# Patient Record
Sex: Female | Born: 1950 | Race: White | Hispanic: No | Smoking: Never smoker
Health system: Southern US, Community
[De-identification: ages and names within clinical notes are randomized; demographics above are authoritative.]

## PROBLEM LIST (undated history)

## (undated) DIAGNOSIS — R112 Nausea with vomiting, unspecified: Secondary | ICD-10-CM

## (undated) DIAGNOSIS — R002 Palpitations: Secondary | ICD-10-CM

## (undated) DIAGNOSIS — M1990 Unspecified osteoarthritis, unspecified site: Secondary | ICD-10-CM

## (undated) DIAGNOSIS — I10 Essential (primary) hypertension: Secondary | ICD-10-CM

## (undated) DIAGNOSIS — R7303 Prediabetes: Secondary | ICD-10-CM

## (undated) DIAGNOSIS — K219 Gastro-esophageal reflux disease without esophagitis: Secondary | ICD-10-CM

## (undated) DIAGNOSIS — H1010 Acute atopic conjunctivitis, unspecified eye: Secondary | ICD-10-CM

## (undated) DIAGNOSIS — R011 Cardiac murmur, unspecified: Secondary | ICD-10-CM

## (undated) DIAGNOSIS — E041 Nontoxic single thyroid nodule: Secondary | ICD-10-CM

## (undated) DIAGNOSIS — R0789 Other chest pain: Secondary | ICD-10-CM

## (undated) DIAGNOSIS — J189 Pneumonia, unspecified organism: Secondary | ICD-10-CM

## (undated) DIAGNOSIS — I2510 Atherosclerotic heart disease of native coronary artery without angina pectoris: Secondary | ICD-10-CM

## (undated) DIAGNOSIS — Z1589 Genetic susceptibility to other disease: Secondary | ICD-10-CM

## (undated) DIAGNOSIS — G4730 Sleep apnea, unspecified: Secondary | ICD-10-CM

## (undated) DIAGNOSIS — F418 Other specified anxiety disorders: Secondary | ICD-10-CM

## (undated) DIAGNOSIS — G709 Myoneural disorder, unspecified: Secondary | ICD-10-CM

## (undated) DIAGNOSIS — I2089 Other forms of angina pectoris: Secondary | ICD-10-CM

## (undated) DIAGNOSIS — Z9889 Other specified postprocedural states: Secondary | ICD-10-CM

## (undated) DIAGNOSIS — D509 Iron deficiency anemia, unspecified: Secondary | ICD-10-CM

## (undated) DIAGNOSIS — IMO0002 Reserved for concepts with insufficient information to code with codable children: Secondary | ICD-10-CM

## (undated) DIAGNOSIS — E785 Hyperlipidemia, unspecified: Secondary | ICD-10-CM

## (undated) DIAGNOSIS — R5383 Other fatigue: Secondary | ICD-10-CM

## (undated) DIAGNOSIS — T8859XA Other complications of anesthesia, initial encounter: Secondary | ICD-10-CM

## (undated) DIAGNOSIS — I208 Other forms of angina pectoris: Secondary | ICD-10-CM

## (undated) DIAGNOSIS — H269 Unspecified cataract: Secondary | ICD-10-CM

## (undated) DIAGNOSIS — R9430 Abnormal result of cardiovascular function study, unspecified: Secondary | ICD-10-CM

## (undated) DIAGNOSIS — K5730 Diverticulosis of large intestine without perforation or abscess without bleeding: Secondary | ICD-10-CM

## (undated) DIAGNOSIS — T7840XA Allergy, unspecified, initial encounter: Secondary | ICD-10-CM

## (undated) HISTORY — DX: Palpitations: R00.2

## (undated) HISTORY — DX: Prediabetes: R73.03

## (undated) HISTORY — DX: Hyperlipidemia, unspecified: E78.5

## (undated) HISTORY — DX: Sleep apnea, unspecified: G47.30

## (undated) HISTORY — DX: Other chest pain: R07.89

## (undated) HISTORY — DX: Acute atopic conjunctivitis, unspecified eye: H10.10

## (undated) HISTORY — DX: Other forms of angina pectoris: I20.8

## (undated) HISTORY — DX: Other forms of angina pectoris: I20.89

## (undated) HISTORY — DX: Abnormal result of cardiovascular function study, unspecified: R94.30

## (undated) HISTORY — DX: Iron deficiency anemia, unspecified: D50.9

## (undated) HISTORY — PX: TUBAL LIGATION: SHX77

## (undated) HISTORY — PX: APPENDECTOMY: SHX54

## (undated) HISTORY — DX: Allergy, unspecified, initial encounter: T78.40XA

## (undated) HISTORY — DX: Other specified anxiety disorders: F41.8

## (undated) HISTORY — DX: Gastro-esophageal reflux disease without esophagitis: K21.9

## (undated) HISTORY — DX: Reserved for concepts with insufficient information to code with codable children: IMO0002

## (undated) HISTORY — PX: BREAST BIOPSY: SHX20

## (undated) HISTORY — DX: Other fatigue: R53.83

## (undated) HISTORY — DX: Genetic susceptibility to other disease: Z15.89

## (undated) HISTORY — PX: THYROIDECTOMY, PARTIAL: SHX18

## (undated) HISTORY — DX: Myoneural disorder, unspecified: G70.9

## (undated) HISTORY — PX: VESICOVAGINAL FISTULA CLOSURE W/ TAH: SUR271

## (undated) HISTORY — PX: GANGLION CYST EXCISION: SHX1691

## (undated) HISTORY — DX: Essential (primary) hypertension: I10

## (undated) HISTORY — DX: Unspecified osteoarthritis, unspecified site: M19.90

## (undated) HISTORY — DX: Nontoxic single thyroid nodule: E04.1

## (undated) HISTORY — DX: Cardiac murmur, unspecified: R01.1

## (undated) HISTORY — DX: Diverticulosis of large intestine without perforation or abscess without bleeding: K57.30

## (undated) HISTORY — DX: Unspecified cataract: H26.9

---

## 1999-03-31 ENCOUNTER — Other Ambulatory Visit: Admission: RE | Admit: 1999-03-31 | Discharge: 1999-03-31 | Payer: Self-pay | Admitting: Family Medicine

## 1999-07-29 ENCOUNTER — Encounter: Payer: Self-pay | Admitting: Urology

## 1999-08-04 ENCOUNTER — Inpatient Hospital Stay (HOSPITAL_COMMUNITY): Admission: RE | Admit: 1999-08-04 | Discharge: 1999-08-05 | Payer: Self-pay | Admitting: Urology

## 2000-08-20 ENCOUNTER — Ambulatory Visit (HOSPITAL_COMMUNITY): Admission: RE | Admit: 2000-08-20 | Discharge: 2000-08-20 | Payer: Self-pay | Admitting: Family Medicine

## 2000-08-20 ENCOUNTER — Encounter: Payer: Self-pay | Admitting: Family Medicine

## 2000-12-06 ENCOUNTER — Encounter: Payer: Self-pay | Admitting: Family Medicine

## 2000-12-06 ENCOUNTER — Ambulatory Visit (HOSPITAL_COMMUNITY): Admission: RE | Admit: 2000-12-06 | Discharge: 2000-12-06 | Payer: Self-pay | Admitting: Family Medicine

## 2002-02-17 ENCOUNTER — Encounter: Payer: Self-pay | Admitting: Internal Medicine

## 2002-02-17 ENCOUNTER — Ambulatory Visit (HOSPITAL_COMMUNITY): Admission: RE | Admit: 2002-02-17 | Discharge: 2002-02-17 | Payer: Self-pay | Admitting: Internal Medicine

## 2002-02-17 ENCOUNTER — Encounter (INDEPENDENT_AMBULATORY_CARE_PROVIDER_SITE_OTHER): Payer: Self-pay | Admitting: Specialist

## 2002-02-24 ENCOUNTER — Other Ambulatory Visit: Admission: RE | Admit: 2002-02-24 | Discharge: 2002-02-24 | Payer: Self-pay | Admitting: Family Medicine

## 2003-01-30 ENCOUNTER — Encounter: Payer: Self-pay | Admitting: Family Medicine

## 2003-01-30 ENCOUNTER — Encounter: Admission: RE | Admit: 2003-01-30 | Discharge: 2003-01-30 | Payer: Self-pay | Admitting: Family Medicine

## 2003-04-04 ENCOUNTER — Other Ambulatory Visit: Admission: RE | Admit: 2003-04-04 | Discharge: 2003-04-04 | Payer: Self-pay | Admitting: Family Medicine

## 2003-06-22 ENCOUNTER — Ambulatory Visit (HOSPITAL_COMMUNITY): Admission: RE | Admit: 2003-06-22 | Discharge: 2003-06-22 | Payer: Self-pay | Admitting: Cardiology

## 2004-01-10 ENCOUNTER — Other Ambulatory Visit: Admission: RE | Admit: 2004-01-10 | Discharge: 2004-01-10 | Payer: Self-pay | Admitting: Diagnostic Radiology

## 2004-02-13 ENCOUNTER — Ambulatory Visit: Admission: RE | Admit: 2004-02-13 | Discharge: 2004-02-13 | Payer: Self-pay | Admitting: Family Medicine

## 2004-03-31 ENCOUNTER — Encounter (INDEPENDENT_AMBULATORY_CARE_PROVIDER_SITE_OTHER): Payer: Self-pay | Admitting: Specialist

## 2004-03-31 ENCOUNTER — Observation Stay (HOSPITAL_COMMUNITY): Admission: RE | Admit: 2004-03-31 | Discharge: 2004-04-01 | Payer: Self-pay | Admitting: Surgery

## 2005-02-16 ENCOUNTER — Other Ambulatory Visit: Admission: RE | Admit: 2005-02-16 | Discharge: 2005-02-16 | Payer: Self-pay | Admitting: Family Medicine

## 2005-03-02 ENCOUNTER — Encounter: Admission: RE | Admit: 2005-03-02 | Discharge: 2005-03-02 | Payer: Self-pay | Admitting: *Deleted

## 2007-07-28 ENCOUNTER — Ambulatory Visit: Payer: Self-pay | Admitting: Cardiology

## 2007-08-08 ENCOUNTER — Ambulatory Visit: Payer: Self-pay

## 2007-08-17 ENCOUNTER — Ambulatory Visit: Payer: Self-pay | Admitting: Cardiology

## 2007-09-12 ENCOUNTER — Ambulatory Visit: Payer: Self-pay | Admitting: Cardiology

## 2007-11-15 ENCOUNTER — Ambulatory Visit: Payer: Self-pay | Admitting: Cardiology

## 2007-12-06 ENCOUNTER — Ambulatory Visit: Payer: Self-pay | Admitting: Cardiology

## 2008-02-21 ENCOUNTER — Encounter: Payer: Self-pay | Admitting: Internal Medicine

## 2008-02-28 ENCOUNTER — Encounter: Payer: Self-pay | Admitting: Internal Medicine

## 2008-05-03 DIAGNOSIS — K5730 Diverticulosis of large intestine without perforation or abscess without bleeding: Secondary | ICD-10-CM | POA: Insufficient documentation

## 2008-05-03 DIAGNOSIS — D509 Iron deficiency anemia, unspecified: Secondary | ICD-10-CM | POA: Insufficient documentation

## 2008-05-03 DIAGNOSIS — I10 Essential (primary) hypertension: Secondary | ICD-10-CM | POA: Insufficient documentation

## 2008-05-03 DIAGNOSIS — K219 Gastro-esophageal reflux disease without esophagitis: Secondary | ICD-10-CM | POA: Insufficient documentation

## 2008-05-03 DIAGNOSIS — J45909 Unspecified asthma, uncomplicated: Secondary | ICD-10-CM | POA: Insufficient documentation

## 2008-05-03 DIAGNOSIS — F341 Dysthymic disorder: Secondary | ICD-10-CM | POA: Insufficient documentation

## 2008-05-03 DIAGNOSIS — M129 Arthropathy, unspecified: Secondary | ICD-10-CM | POA: Insufficient documentation

## 2008-05-03 DIAGNOSIS — M1990 Unspecified osteoarthritis, unspecified site: Secondary | ICD-10-CM | POA: Insufficient documentation

## 2008-05-03 DIAGNOSIS — G4733 Obstructive sleep apnea (adult) (pediatric): Secondary | ICD-10-CM | POA: Insufficient documentation

## 2008-05-07 ENCOUNTER — Ambulatory Visit: Payer: Self-pay | Admitting: Internal Medicine

## 2008-05-07 LAB — CONVERTED CEMR LAB: Tissue Transglutaminase Ab, IgA: 0.9 units (ref <7)

## 2008-05-08 LAB — CONVERTED CEMR LAB
Basophils Absolute: 0.1 10*3/uL (ref 0.0–0.1)
Basophils Relative: 0.9 % (ref 0.0–3.0)
Eosinophils Absolute: 0.1 10*3/uL (ref 0.0–0.7)
Eosinophils Relative: 1.2 % (ref 0.0–5.0)
HCT: 40.8 % (ref 36.0–46.0)
Hemoglobin: 14 g/dL (ref 12.0–15.0)
Iron: 107 ug/dL (ref 42–145)
Lymphocytes Relative: 30.4 % (ref 12.0–46.0)
MCHC: 34.3 g/dL (ref 30.0–36.0)
MCV: 89.7 fL (ref 78.0–100.0)
Monocytes Absolute: 0.4 10*3/uL (ref 0.1–1.0)
Monocytes Relative: 7.1 % (ref 3.0–12.0)
Neutro Abs: 3.6 10*3/uL (ref 1.4–7.7)
Neutrophils Relative %: 60.4 % (ref 43.0–77.0)
Platelets: 277 10*3/uL (ref 150–400)
RBC: 4.54 M/uL (ref 3.87–5.11)
RDW: 17.8 % — ABNORMAL HIGH (ref 11.5–14.6)
WBC: 6.1 10*3/uL (ref 4.5–10.5)

## 2008-05-22 ENCOUNTER — Ambulatory Visit: Payer: Self-pay | Admitting: Internal Medicine

## 2008-05-22 ENCOUNTER — Encounter: Payer: Self-pay | Admitting: Internal Medicine

## 2008-05-24 ENCOUNTER — Encounter: Payer: Self-pay | Admitting: Internal Medicine

## 2008-06-18 ENCOUNTER — Ambulatory Visit: Payer: Self-pay | Admitting: Cardiology

## 2008-07-06 ENCOUNTER — Ambulatory Visit: Payer: Self-pay | Admitting: Internal Medicine

## 2008-07-09 ENCOUNTER — Telehealth: Payer: Self-pay | Admitting: Internal Medicine

## 2008-07-09 LAB — CONVERTED CEMR LAB
HCT: 40.4 % (ref 36.0–46.0)
Hemoglobin: 14.2 g/dL (ref 12.0–15.0)

## 2008-07-27 ENCOUNTER — Ambulatory Visit: Payer: Self-pay | Admitting: Internal Medicine

## 2008-07-30 ENCOUNTER — Telehealth: Payer: Self-pay | Admitting: Internal Medicine

## 2008-09-05 ENCOUNTER — Telehealth: Payer: Self-pay | Admitting: Internal Medicine

## 2008-11-14 ENCOUNTER — Telehealth: Payer: Self-pay | Admitting: Internal Medicine

## 2008-11-16 ENCOUNTER — Ambulatory Visit: Payer: Self-pay | Admitting: Internal Medicine

## 2008-11-16 LAB — CONVERTED CEMR LAB
Bacteria, UA: NEGATIVE
Basophils Absolute: 0.1 10*3/uL (ref 0.0–0.1)
Basophils Relative: 1.1 % (ref 0.0–3.0)
Bilirubin, Direct: 0.1 mg/dL (ref 0.0–0.3)
CO2: 31 meq/L (ref 19–32)
Calcium: 9.4 mg/dL (ref 8.4–10.5)
Eosinophils Relative: 1.3 % (ref 0.0–5.0)
Folate: >20 ng/mL
GFR calc Af Amer: 94 mL/min
GFR calc non Af Amer: 78 mL/min
Hgb A1c MFr Bld: 5.8 % (ref 4.6–6.0)
Ketones, ur: NEGATIVE mg/dL
Leukocytes, UA: NEGATIVE
Lymphocytes Relative: 37.6 % (ref 12.0–46.0)
Mucus, UA: NEGATIVE
Neutrophils Relative %: 52.3 % (ref 43.0–77.0)
Prolactin: 8.5 ng/mL
RBC / HPF: NONE SEEN
RBC: 4.49 M/uL (ref 3.87–5.11)
Saturation Ratios: 17.6 % — ABNORMAL LOW (ref 20.0–50.0)
Sodium: 143 meq/L (ref 135–145)
Specific Gravity, Urine: <=1.005 (ref 1.000–1.035)
TSH: 1.96 microintl units/mL (ref 0.35–5.50)
Total Bilirubin: 0.7 mg/dL (ref 0.3–1.2)
Transferrin: 276.7 mg/dL (ref 212.0–360.0)
Urine Glucose: NEGATIVE mg/dL
Vitamin B-12: 512 pg/mL (ref 211–911)
WBC: 6.4 10*3/uL (ref 4.5–10.5)
pH: 6.5 (ref 5.0–8.0)

## 2008-11-18 ENCOUNTER — Encounter: Payer: Self-pay | Admitting: Internal Medicine

## 2008-11-30 ENCOUNTER — Ambulatory Visit: Payer: Self-pay | Admitting: Internal Medicine

## 2008-12-11 DIAGNOSIS — E785 Hyperlipidemia, unspecified: Secondary | ICD-10-CM | POA: Insufficient documentation

## 2008-12-12 ENCOUNTER — Ambulatory Visit: Payer: Self-pay | Admitting: Cardiology

## 2008-12-12 ENCOUNTER — Encounter: Payer: Self-pay | Admitting: Cardiology

## 2009-02-18 ENCOUNTER — Encounter: Payer: Self-pay | Admitting: Internal Medicine

## 2009-02-19 ENCOUNTER — Ambulatory Visit: Payer: Self-pay | Admitting: Internal Medicine

## 2009-02-19 ENCOUNTER — Encounter (INDEPENDENT_AMBULATORY_CARE_PROVIDER_SITE_OTHER): Payer: Self-pay | Admitting: *Deleted

## 2009-02-19 LAB — CONVERTED CEMR LAB
BUN: 22 mg/dL (ref 6–23)
Cholesterol: 161 mg/dL (ref 0–200)
Creatinine, Ser: 0.7 mg/dL (ref 0.4–1.2)
GFR calc non Af Amer: 90.6 mL/min (ref >60)
Glucose, Bld: 88 mg/dL (ref 70–99)
HDL: 44 mg/dL (ref >39.00)
LDL Cholesterol: 77 mg/dL (ref 0–99)
Total CHOL/HDL Ratio: 4
Triglycerides: 200 mg/dL — ABNORMAL HIGH (ref 0.0–149.0)
Vit D, 25-Hydroxy: 39 ng/mL (ref 30–89)

## 2009-02-20 ENCOUNTER — Encounter: Payer: Self-pay | Admitting: Internal Medicine

## 2009-03-05 ENCOUNTER — Telehealth: Payer: Self-pay | Admitting: Internal Medicine

## 2009-07-05 ENCOUNTER — Ambulatory Visit: Payer: Self-pay | Admitting: Internal Medicine

## 2009-07-05 DIAGNOSIS — R7303 Prediabetes: Secondary | ICD-10-CM | POA: Insufficient documentation

## 2009-07-05 LAB — CONVERTED CEMR LAB
BUN: 25 mg/dL — ABNORMAL HIGH (ref 6–23)
CO2: 32 meq/L (ref 19–32)
Calcium: 9 mg/dL (ref 8.4–10.5)
Creatinine, Ser: 0.7 mg/dL (ref 0.4–1.2)
Hgb A1c MFr Bld: 5.8 % (ref 4.6–6.5)

## 2009-08-28 ENCOUNTER — Encounter: Admission: RE | Admit: 2009-08-28 | Discharge: 2009-09-04 | Payer: Self-pay | Admitting: Internal Medicine

## 2009-08-28 ENCOUNTER — Encounter: Payer: Self-pay | Admitting: Internal Medicine

## 2009-09-12 ENCOUNTER — Encounter: Payer: Self-pay | Admitting: Internal Medicine

## 2009-10-14 ENCOUNTER — Encounter: Admission: RE | Admit: 2009-10-14 | Discharge: 2009-11-21 | Payer: Self-pay | Admitting: Internal Medicine

## 2009-11-01 ENCOUNTER — Ambulatory Visit: Payer: Self-pay | Admitting: Internal Medicine

## 2009-11-01 LAB — CONVERTED CEMR LAB
Albumin: 3.9 g/dL (ref 3.5–5.2)
Alkaline Phosphatase: 59 units/L (ref 39–117)
Calcium: 9.5 mg/dL (ref 8.4–10.5)
Creatinine, Ser: 0.7 mg/dL (ref 0.4–1.2)
GFR calc non Af Amer: 90.39 mL/min (ref >60)
Glucose, Bld: 85 mg/dL (ref 70–99)
Hgb A1c MFr Bld: 5.7 % (ref 4.6–6.5)
Sodium: 143 meq/L (ref 135–145)
Total Protein: 7.1 g/dL (ref 6.0–8.3)

## 2009-11-04 ENCOUNTER — Encounter: Payer: Self-pay | Admitting: Internal Medicine

## 2009-12-12 ENCOUNTER — Encounter: Payer: Self-pay | Admitting: Cardiology

## 2009-12-13 ENCOUNTER — Ambulatory Visit: Payer: Self-pay | Admitting: Cardiology

## 2010-01-24 ENCOUNTER — Telehealth: Payer: Self-pay | Admitting: Internal Medicine

## 2010-01-28 ENCOUNTER — Encounter: Payer: Self-pay | Admitting: Internal Medicine

## 2010-02-24 ENCOUNTER — Encounter: Admission: RE | Admit: 2010-02-24 | Discharge: 2010-05-25 | Payer: Self-pay | Admitting: Internal Medicine

## 2010-06-19 ENCOUNTER — Ambulatory Visit: Payer: Self-pay | Admitting: Internal Medicine

## 2010-06-19 LAB — CONVERTED CEMR LAB
Alkaline Phosphatase: 56 units/L (ref 39–117)
BUN: 23 mg/dL (ref 6–23)
Basophils Absolute: 0 10*3/uL (ref 0.0–0.1)
Basophils Relative: 0.7 % (ref 0.0–3.0)
Bilirubin, Direct: 0.1 mg/dL (ref 0.0–0.3)
CO2: 31 meq/L (ref 19–32)
Calcium: 9.6 mg/dL (ref 8.4–10.5)
Chloride: 101 meq/L (ref 96–112)
Cholesterol: 143 mg/dL (ref 0–200)
Creatinine, Ser: 0.7 mg/dL (ref 0.4–1.2)
Eosinophils Absolute: 0.1 10*3/uL (ref 0.0–0.7)
Glucose, Bld: 100 mg/dL — ABNORMAL HIGH (ref 70–99)
HDL: 43.1 mg/dL (ref >39.00)
Hgb A1c MFr Bld: 5.9 % (ref 4.6–6.5)
Iron: 93 ug/dL (ref 42–145)
Lymphocytes Relative: 34 % (ref 12.0–46.0)
MCHC: 34.6 g/dL (ref 30.0–36.0)
MCV: 95 fL (ref 78.0–100.0)
Monocytes Absolute: 0.3 10*3/uL (ref 0.1–1.0)
Neutrophils Relative %: 57.2 % (ref 43.0–77.0)
Platelets: 260 10*3/uL (ref 150.0–400.0)
RDW: 12.9 % (ref 11.5–14.6)
Total CHOL/HDL Ratio: 3
Total Protein: 7.1 g/dL (ref 6.0–8.3)
Transferrin: 297.2 mg/dL (ref 212.0–360.0)
Triglycerides: 130 mg/dL (ref 0.0–149.0)

## 2010-06-25 ENCOUNTER — Encounter
Admission: RE | Admit: 2010-06-25 | Discharge: 2010-06-25 | Payer: Self-pay | Source: Home / Self Care | Attending: Internal Medicine | Admitting: Internal Medicine

## 2010-07-01 ENCOUNTER — Telehealth: Payer: Self-pay | Admitting: Internal Medicine

## 2010-07-04 ENCOUNTER — Telehealth: Payer: Self-pay | Admitting: Internal Medicine

## 2010-07-14 ENCOUNTER — Telehealth: Payer: Self-pay | Admitting: Internal Medicine

## 2010-09-22 ENCOUNTER — Encounter
Admission: RE | Admit: 2010-09-22 | Discharge: 2010-10-07 | Payer: Self-pay | Source: Home / Self Care | Attending: Internal Medicine | Admitting: Internal Medicine

## 2010-09-22 ENCOUNTER — Encounter: Payer: Self-pay | Admitting: Internal Medicine

## 2010-09-23 ENCOUNTER — Telehealth: Payer: Self-pay | Admitting: Internal Medicine

## 2010-09-23 ENCOUNTER — Encounter: Payer: Self-pay | Admitting: Internal Medicine

## 2010-10-02 ENCOUNTER — Telehealth: Payer: Self-pay | Admitting: Internal Medicine

## 2010-10-07 NOTE — Letter (Signed)
Summary: Results Follow-up Letter  [facility]  [address] [place], [place] [address]   Phone: [phone]  Fax: [phone]    11/04/2009  [address] [place], [place]  [address]  Dear [facility],   The following are the results of your recent test(s):  Test     Result     Blood sugar     85 A1C= 5.7     unchanged Liver       normal   _________________________________________________________  Please call for an appointment as directed _________________________________________________________ _________________________________________________________ _________________________________________________________  Sincerely,  Sanda Linger MD [facility]

## 2010-10-07 NOTE — Assessment & Plan Note (Signed)
Summary: cpx-lb   Vital Signs:  Patient profile:   61 year old female Menstrual status:  postmenopausal Height:      65 inches Weight:      180.25 pounds BMI:     30.10 O2 Sat:      96 % on Room air Temp:     98.1 degrees F oral Pulse rate:   73 / minute Pulse rhythm:   regular Resp:     16 per minute BP sitting:   122 / 70  (left arm) Cuff size:   large  Vitals Entered By: Rock Nephew CMA (June 19, 2010 9:06 AM)  Nutrition Counseling: Patient's BMI is greater than 25 and therefore counseled on weight management options.  O2 Flow:  Room air CC: CPX w/labs discuss med refill and shigles vaccine, Preventive Care, Lipid Management Is Patient Diabetic? No Pain Assessment Patient in pain? no       Does patient need assistance? Functional Status Self care Ambulation Normal     Menstrual Status postmenopausal Last PAP Result Normal   Primary Care Provider:  Etta Grandchild MD  CC:  CPX w/labs discuss med refill and shigles vaccine, Preventive Care, and Lipid Management.  History of Present Illness: She returns for a complete physical, she needs a few prescription refills but she has no other complaints.  Asthma History    Initial Asthma Severity Rating:    Age range: 12+ years    Symptoms: 0-2 days/week    Nighttime Awakenings: 0-2/month    Interferes w/ normal activity: no limitations    SABA use (not for EIB): 0-2 days/week    Asthma Severity Assessment: Intermittent  Lipid Management History:      Positive NCEP/ATP III risk factors include female age 36 years old or older, hypertension, and ASHD (either angina/prior MI/prior CABG).  Negative NCEP/ATP III risk factors include non-diabetic, no family history for ischemic heart disease, non-tobacco-user status, no prior stroke/TIA, no peripheral vascular disease, and no history of aortic aneurysm.        The patient states that she knows about the "Therapeutic Lifestyle Change" diet.  Her compliance with the  TLC diet is good.  The patient expresses understanding of adjunctive measures for cholesterol lowering.  Adjunctive measures started by the patient include aerobic exercise, fiber, ASA, limit alcohol consumpton, and weight reduction.  She expresses no side effects from her lipid-lowering medication.  The patient denies any symptoms to suggest myopathy or liver disease.     Current Medications (verified): 1)  Pravachol 40 Mg Tabs (Pravastatin Sodium) .Marland Kitchen.. 1 Once Daily 2)  Flonase 50 Mcg/act Susp (Fluticasone Propionate) .... Uad 3)  Proventil Hfa 108 (90 Base) Mcg/act Aers (Albuterol Sulfate) .... As Needed 4)  Patanol 0.1 % Soln (Olopatadine Hcl) .Marland Kitchen.. 1-2 Gtt Each Eye  Two Times A Day As Needed 5)  Alprazolam 0.25 Mg Tabs (Alprazolam) .... Take 1 Tablet As Needed 6)  Celebrex 200 Mg Caps (Celecoxib) .... Take 1 Tablet By Mouth Once A Day 7)  Prevacid 24hr 15 Mg Cpdr (Lansoprazole) .... 2 Once Daily 8)  Prempro 0.45-1.5 Mg Tabs (Conj Estrog-Medroxyprogest Ace) .... One Tablet By Mouth Every Pm 9)  Vitamin C Cr 1000 Mg Cr-Tabs (Ascorbic Acid) .... One Tablet By Mouth Every Am 10)  L-Arginine 1000 Mg Tabs (Arginine) .... 3 Tabs Two Times A Day 11)  Cvs Vitamin D 2000 Unit Tabs (Cholecalciferol) .... One Every Day 12)  Multivitamins   Tabs (Multiple Vitamin) .... One Tablet Every  Day 13)  Fish Oil  Oil (Fish Oil) .... 3000 Mg Every Day 14)  Calcium-Magnesium-Zinc 15)  Diovan 160 Mg Tabs (Valsartan) .... One By Mouth Once Daily 16)  L Citrulline 500mg  .... Take 1 Tablet By Mouth Two Times A Day 17)  Coq10 100 Mg Caps (Coenzyme Q10) .Marland Kitchen.. 1 Once Daily 18)  Aspirin 81 Mg Tbec (Aspirin) .... Take One Tablet By Mouth Daily 19)  Desonide Lotion 20)  Luxiq 0.12 % Foam (Betamethasone Valerate) 21)  Paxil Cr 25 Mg Xr24h-Tab (Paroxetine Hcl) .Marland Kitchen.. 1 By Mouth Qd  Allergies (verified): 1)  ! Demerol 2)  ! Augmentin 3)  ! 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)   cath 2004..coronaries normal  /  nuclear  08/2007  normal EF  70% cath..2004  /   normal..nuclear..12/08  (no echo as of 4/7/20110 Thyroid nodule DIVERTICULOSIS, COLON (ICD-562.10) HYPERLIPIDEMIA (ICD-272.4) CONJUNCTIVITIS, ALLERGIC (ICD-372.14) FATIGUE, ACUTE (ICD-780.79) ANGINA PECTORIS NEC/NOS (ICD-413.9) THYROID CANCER, HX OF (ICD-V10.87) DIVERTICULOSIS, COLON (ICD-562.10) SLEEP APNEA (ICD-780.57) DEGENERATIVE JOINT DISEASE (ICD-715.90) DEPRESSION/ANXIETY (ICD-300.4) ANEMIA, IRON DEFICIENCY (ICD-280.9) GERD (ICD-530.81) PALPITATIONS, HX OF (ICD-V12.50) ARTHRITIS (ICD-716.90) ASTHMA (ICD-493.90) HYPERTENSION (ICD-401.9)  HLA-B27 positive  Past Surgical History: Last updated: 12/11/2008 * BLADDER TACK BREAST BIOPSY, HX OF (ICD-V15.9) TUBAL LIGATION, HX OF (ICD-V26.51) APPENDECTOMY, HX OF (ICD-V45.79) * CAESAREAN SECTION X2 HX OF GANGLION CYST, HX OF REMOVED (ICD-V13.3) UNSPEC PERSONAL HX PRESENTING HAZARDS HEALTH (ICD-V15.9) * HX OF LEFT THYROID LOBECTOMY  Family History: Last updated: 12/11/2008 No FH of Colon Cancer: Family History of Diabetes: Uncles Family History of Heart Disease: Maternal Aunt Testicular Cancer-brother Family History of Arthritis Family History Hypertension Father died at 75 yoa due to CNS aneurysm Mother died at age 17 Lung Cancer  Social History: Last updated: 12/11/2008 Occupation: Runner, broadcasting/film/video Alcohol Use - no Daily Caffeine Use Illicit Drug Use - no Regular exercise-yes Divorced   Risk Factors: Alcohol Use: 0 (07/05/2009) Exercise: yes (07/27/2008)  Risk Factors: Smoking Status: never (07/05/2009) Passive Smoke Exposure: no (07/05/2009)  Family History: Reviewed history from 12/11/2008 and no changes required. No FH of Colon Cancer: Family History of Diabetes: Uncles Family History of Heart Disease: Maternal Aunt Testicular Cancer-brother Family History of Arthritis Family History  Hypertension Father died at 31 yoa due to CNS aneurysm Mother died at age 78 Lung Cancer  Social History: Reviewed history from 12/11/2008 and no changes required. Occupation: Runner, broadcasting/film/video Alcohol Use - no Daily Caffeine Use Illicit Drug Use - no Regular exercise-yes Divorced   Review of Systems       The patient complains of weight gain.  The patient denies anorexia, fever, weight loss, hoarseness, chest pain, syncope, dyspnea on exertion, peripheral edema, prolonged cough, headaches, hemoptysis, abdominal pain, melena, hematochezia, severe indigestion/heartburn, hematuria, muscle weakness, suspicious skin lesions, depression, abnormal bleeding, enlarged lymph nodes, angioedema, and breast masses.   Psych:  Complains of anxiety; denies alternate hallucination ( auditory/visual), depression, easily angered, easily tearful, irritability, mental problems, panic attacks, sense of great danger, suicidal thoughts/plans, thoughts of violence, unusual visions or sounds, and thoughts /plans of harming others. Endo:  Denies cold intolerance, excessive hunger, excessive thirst, excessive urination, heat intolerance, polyuria, and weight change.  Physical Exam  General:  alert, well-developed, well-nourished, well-hydrated, cooperative to examination, good hygiene, and overweight-appearing.   Head:  normocephalic, atraumatic, and no abnormalities observed.   Mouth:  good dentition, no gingival abnormalities, no dental plaque, pharynx pink and moist, no erythema, no exudates, and no  posterior lymphoid hypertrophy.   Neck:  supple, full ROM, no masses, no thyromegaly, no thyroid nodules or tenderness, no JVD, no HJR, normal carotid upstroke, and no carotid bruits.   Breasts:  No mass, nodules, thickening, tenderness, bulging, retraction, inflamation, nipple discharge or skin changes noted.   Lungs:  Normal respiratory effort, chest expands symmetrically. Lungs are clear to auscultation, no crackles or  wheezes. Heart:  Normal rate and regular rhythm. S1 and S2 normal without gallop, murmur, click, rub or other extra sounds. Abdomen:  soft, non-tender, normal bowel sounds, no distention, no masses, no guarding, no rigidity, no rebound tenderness, no abdominal hernia, no inguinal hernia, no hepatomegaly, and no splenomegaly.   Rectal:  No external abnormalities noted. Normal sphincter tone. No rectal masses or tenderness. Msk:  normal ROM, no joint tenderness, no joint swelling, no joint warmth, and no redness over joints.   Pulses:  R and L carotid,radial,femoral,dorsalis pedis and posterior tibial pulses are full and equal bilaterally Extremities:  No clubbing, cyanosis, edema, or deformity noted with normal full range of motion of all joints.   Neurologic:  No cranial nerve deficits noted. Station and gait are normal. Plantar reflexes are down-going bilaterally. DTRs are symmetrical throughout. Sensory, motor and coordinative functions appear intact. Skin:  turgor normal, color normal, no rashes, no ecchymoses, no petechiae, no purpura, no ulcerations, and no edema.   Cervical Nodes:  no anterior cervical adenopathy and no posterior cervical adenopathy.   Axillary Nodes:  no R axillary adenopathy and no L axillary adenopathy.   Inguinal Nodes:  no R inguinal adenopathy and no L inguinal adenopathy.   Psych:  Cognition and judgment appear intact. Alert and cooperative with normal attention span and concentration. No apparent delusions, illusions, hallucinations   Impression & Recommendations:  Problem # 1:  HYPERGLYCEMIA (ICD-790.29) Assessment Unchanged  Orders: Venipuncture ([address]) TLB-Lipid Panel (80061-LIPID) TLB-BMP (Basic Metabolic Panel-BMET) (80048-METABOL) TLB-CBC Platelet - w/Differential (85025-CBCD) TLB-Hepatic/Liver Function Pnl (80076-HEPATIC) TLB-TSH (Thyroid Stimulating Hormone) (84443-TSH) TLB-A1C / Hgb A1C (Glycohemoglobin) (83036-A1C) TLB-IBC Pnl (Iron/FE;Transferrin)  (83550-IBC)  Problem # 2:  HYPERLIPIDEMIA (ICD-272.4) Assessment: Unchanged  The following medications were removed from the medication list:    Niaspan 500 Mg Cr-tabs (Niacin (antihyperlipidemic)) ..... One by mouth at bedtime Her updated medication list for this problem includes:    Pravachol 40 Mg Tabs (Pravastatin sodium) .Marland Kitchen... 1 once daily  Orders: Venipuncture ([address]) TLB-Lipid Panel (80061-LIPID) TLB-BMP (Basic Metabolic Panel-BMET) (80048-METABOL) TLB-CBC Platelet - w/Differential (85025-CBCD) TLB-Hepatic/Liver Function Pnl (80076-HEPATIC) TLB-TSH (Thyroid Stimulating Hormone) (84443-TSH) TLB-A1C / Hgb A1C (Glycohemoglobin) (83036-A1C) TLB-IBC Pnl (Iron/FE;Transferrin) (83550-IBC)  Problem # 3:  ROUTINE GENERAL MEDICAL EXAM@HEALTH  CARE FACL (ICD-V70.0) Assessment: Unchanged  Mammogram: Normal Bilateral (09/12/2009) Pap smear: Normal (06/20/2008) Colonoscopy: Location:  [facility].   (05/22/2008) Flu Vax: Fluvax 3+ (06/19/2010)   Chol: 161 (02/19/2009)   HDL: 44.00 (02/19/2009)   LDL: 77 (02/19/2009)   TG: 200.0 (02/19/2009) TSH: 1.96 (11/16/2008)   HgbA1C: 5.7 (11/01/2009)    Discussed using sunscreen, use of alcohol, drug use, self breast exam, routine dental care, routine eye care, schedule for GYN exam, routine physical exam, seat belts, multiple vitamins, osteoporosis prevention, adequate calcium intake in diet, recommendations for immunizations, mammograms and Pap smears.  Discussed exercise and checking cholesterol.    Problem # 4:  SLEEP APNEA (ICD-780.57) Assessment: Unchanged  Orders: Sleep Disorder Referral (Sleep Disorder) Home Health Referral (Home Health) Home Health Referral [facility])  Problem # 5:  DEPRESSION/ANXIETY (ICD-300.4) Assessment: Unchanged  Problem #  6:  ANEMIA, IRON DEFICIENCY (ICD-280.9) Assessment: Improved  Orders: Venipuncture ([address]) TLB-Lipid Panel (80061-LIPID) TLB-BMP (Basic Metabolic Panel-BMET)  (80048-METABOL) TLB-CBC Platelet - w/Differential (85025-CBCD) TLB-Hepatic/Liver Function Pnl (80076-HEPATIC) TLB-TSH (Thyroid Stimulating Hormone) (84443-TSH) TLB-A1C / Hgb A1C (Glycohemoglobin) (83036-A1C) TLB-IBC Pnl (Iron/FE;Transferrin) (83550-IBC)  Hgb: 14.7 (11/16/2008)   Hct: 42.4 (11/16/2008)   Platelets: 262 (11/16/2008) RBC: 4.49 (11/16/2008)   RDW: 12.1 (11/16/2008)   WBC: 6.4 (11/16/2008) MCV: 94.3 (11/16/2008)   MCHC: 34.8 (11/16/2008) Iron: 68 (11/16/2008)   % Sat: 17.6 (11/16/2008) B12: 512 (11/16/2008)   Folate: > 20.0 ng/mL (11/16/2008)   TSH: 1.96 (11/16/2008)  Complete Medication List: 1)  Pravachol 40 Mg Tabs (Pravastatin sodium) .Marland Kitchen.. 1 once daily 2)  Flonase 50 Mcg/act Susp (Fluticasone propionate) .... Uad 3)  Proventil Hfa 108 (90 Base) Mcg/act Aers (Albuterol sulfate) .... As needed 4)  Patanol 0.1 % Soln (Olopatadine hcl) .Marland Kitchen.. 1-2 gtt each eye  two times a day as needed 5)  Alprazolam 0.25 Mg Tabs (Alprazolam) .... Take 1 by mouth two times a day prn 6)  Celebrex 200 Mg Caps (Celecoxib) .... Take 1 tablet by mouth once a day 7)  Prevacid 24hr 15 Mg Cpdr (Lansoprazole) .... 2 once daily 8)  Prempro 0.45-1.5 Mg Tabs (Conj estrog-medroxyprogest ace) .... One tablet by mouth every pm 9)  Vitamin C Cr 1000 Mg Cr-tabs (Ascorbic acid) .... One tablet by mouth every am 10)  L-arginine 1000 Mg Tabs (Arginine) .... 3 tabs two times a day 11)  Cvs Vitamin D 2000 Unit Tabs (Cholecalciferol) .... One every day 12)  Multivitamins Tabs (Multiple vitamin) .... One tablet every day 13)  Fish Oil Oil (Fish oil) .... 3000 mg every day 14)  Calcium-magnesium-zinc  15)  Diovan 160 Mg Tabs (Valsartan) .... One by mouth once daily 16)  L Citrulline 500mg   .... Take 1 tablet by mouth two times a day 17)  Coq10 100 Mg Caps (Coenzyme q10) .Marland Kitchen.. 1 once daily 18)  Aspirin 81 Mg Tbec (Aspirin) .... Take one tablet by mouth daily 19)  Desonide Lotion  20)  Luxiq 0.12 % Foam (Betamethasone  valerate) 21)  Paxil Cr 25 Mg Xr24h-tab (Paroxetine hcl) .Marland Kitchen.. 1 by mouth qd 22)  Nizoral 2 % Sham (Ketoconazole) .... Use once daily  Other Orders: Admin 1st Vaccine (62130) Flu Vaccine 42yrs + [phone]) Zoster (Shingles) Vaccine Live [phone])  Lipid Assessment/Plan:      Based on NCEP/ATP III, the patient's risk factor category is "history of coronary disease, peripheral vascular disease, cerebrovascular disease, or aortic aneurysm".  The patient's lipid goals are as follows: Total cholesterol goal is 200; LDL cholesterol goal is 100; HDL cholesterol goal is 40; Triglyceride goal is 150.    Colorectal Screening:  Current Recommendations:    Hemoccult: NEG X 1 today  PAP Screening:    Hx Cervical Dysplasia in last 5 yrs? No    3 normal PAP smears in last 5 yrs? Yes    Last PAP smear:  06/20/2008    Reviewed PAP smear recommendations:  patient refuses understanding risks of delayed diagnosis  Mammogram Screening:    Last Mammogram:  09/12/2009    Reviewed Mammogram recommendations:  mammogram not due yet  Mammogram Results:    Date of Exam:  09/12/2009    Results:  Normal Bilateral  Osteoporosis Risk Assessment:  Risk Factors for Fracture or Low Bone Density:   Race (White or Asian):     yes   Hx of Fractures:  no   FH of Osteoporosis:     no   Hx of falls:       no   Physically inactive:     no   Smoking status:       never   High alcohol use:     no   High caffeine use:     no   Low calcium/Vit. D intake:   no   Corticosteroid use:     no   Thyroid replacement:     no   Dilantin use:       no   Heparin use:       no   Thyroid disease:     no   Rheumatoid Arthritis:     no   Parathyroid disease:     no  Immunization & Chemoprophylaxis:    Influenza vaccine: Fluvax 3+  (06/19/2010)   Patient Instructions: 1)  Please schedule a follow-up appointment in 6 months. 2)  It is important that you exercise regularly at least 20 minutes 5 times a week. If you develop  chest pain, have severe difficulty breathing, or feel very tired , stop exercising immediately and seek medical attention. 3)  You need to lose weight. Consider a lower calorie diet and regular exercise.  4)  Schedule your mammogram. 5)  You need to have a Pap Smear to prevent cervical cancer. Prescriptions: PROVENTIL HFA 108 (90 BASE) MCG/ACT AERS (ALBUTEROL SULFATE) as needed  #1 inh x 11   Entered and Authorized by:   Etta Grandchild MD   Signed by:   Etta Grandchild MD on 06/19/2010   Method used:   Electronically to        [facility]. [phone]* (retail)       [address]       [place], [place]  [address]       Ph: [phone]       Fax: [phone]   RxID:   [id number] ALPRAZOLAM 0.25 MG TABS (ALPRAZOLAM) Take 1 by mouth two times a day prn  #75 x 3   Entered and Authorized by:   Etta Grandchild MD   Signed by:   Etta Grandchild MD on 06/19/2010   Method used:   Print then Give to Patient   RxID:   [id number] NIZORAL 2 % SHAM (KETOCONAZOLE) Use once daily  #1 bottle x 11   Entered and Authorized by:   Etta Grandchild MD   Signed by:   Etta Grandchild MD on 06/19/2010   Method used:   Electronically to        [facility]. [phone]* (retail)       [address]       [place], [place]  [address]       Ph: [phone]       Fax: [phone]   RxID:   [id number]     .[email or web address] Flu Vaccine Consent Questions     Do you have a history of severe allergic reactions to this vaccine? no    Any prior history of allergic reactions to egg and/or gelatin? no    Do you have a sensitivity to the preservative Thimersol? no    Do you have a past history of Guillan-Barre Syndrome? no    Do you currently have an acute febrile illness? no    Have  you ever had a severe reaction to latex? no    Vaccine information given and explained to patient? yes    Are you currently pregnant? no    Lot  Number:[id number]   Exp Date:03/07/2011   Site Given  Left Deltoid IM  Immunizations Administered:  Zostavax # 1:    Vaccine Type: Zostavax    Site: right deltoid    Mfr: Merck    Dose: 0.65    Route: Roland    Given by: Rock Nephew CMA    Exp. Date: 04/11/2011    Lot #: [id number]    VIS given: 06/19/05 given June 19, 2010.

## 2010-10-07 NOTE — Progress Notes (Signed)
Summary: CPAP  Phone Note Call from Patient   Summary of Call: Pt needs referral specifically for CPAP supplies to go to [facility], previous referral was incorrect.  Initial call taken by: Lamar Sprinkles, CMA,  July 04, 2010 9:30 AM     Appended Document: CPAP Patient DOES NOT want [facility]. [facility] is working on getting pt supplies

## 2010-10-07 NOTE — Progress Notes (Signed)
Summary: PA-Celebrex  Phone Note From Pharmacy   Summary of Call: PA-Celebrex Initial call taken by: Dagoberto Reef,  Jan 24, 2010 4:15 PM  Follow-up for Phone Call        approved until 01/29/2011. Follow-up by: Lucious Groves,  Feb 04, 2010 9:27 AM

## 2010-10-07 NOTE — Medication Information (Signed)
Summary: Prior Auth/medco  Prior Auth/medco   Imported By: Lester Sanford 02/06/2010 07:29:09  _____________________________________________________________________  External Attachment:    Type:   Image     Comment:   External Document

## 2010-10-07 NOTE — Progress Notes (Signed)
Summary: Call Report  Phone Note Other Incoming   Caller: [facility] Summary of Call: [facility] Triage Call Report Triage Record Num: [id number] Operator: Tomasita Crumble Patient Name: Kayla Khan Call Date & Time: 07/13/2010 10:39:07AM Patient Phone: [phone] PCP: Sanda Linger Patient Gender: Female PCP Fax : Patient DOB: 06-02-1951 Practice Name: [facility] Reason for Call: Pt. calling; reports cold sx. onset "a week ago". Afebrile/subjective. Cough w yellow/green sputum, chest pain w/ cough, laryngitis. Reports she has not had to use Spiriva or MDI or had asthma problems in past 2 years - since taking allergy desensitization. Advised follow up with MD within 4 hours per nursing judgement and Cough protocol. Caller states she may wait and call MD on 11/7. Home care measures for the interim. Protocol(s) Used: Cough - Adult Protocol(s) Used: Upper Respiratory Infection (URI) Recommended Outcome per Protocol: See Provider within 24 hours Override Outcome if Used in Protocol: See Provider within 4 hours RN Reason for Override Outcome: Nursing Judgement Used. Reason for Outcome: Cough is the symptom causing most concern Productive cough with colored sputum (other than clear or white sputum) Care Advice:  ~ Use a cool mist humidifier to moisten air. Be sure to clean according to manufacturer's instructions. Call provider if fever greater than 101.5 F (38.6 C) or 100.5 F (38.1C) in an immunocompromised patient (such as diabetes, HIV/AIDS, renal disease, chemotherapy, organ transplant, or chronic steroid use) has not improved in 24 hours.  ~ Increase fluids to 8-12 eight oz (1.6 to 2.4 liters) glasses per day, half of them to be water. Soups, popsicles, fruit juices, non-caffeinated sodas (unless restricting sodium intake), jello, broths, decaf teas, etc. are all okay. Warm fluids can be soothing.  ~ Coughing up mucus or phlegm helps to get rid of an infection. A productive  cough should not be stopped. A cough medicine with guaifenesin (Robitussin, Mucinex) can help loosen the mucus. Cough medicine with dextromethorphan  Initial call taken by: Margaret Pyle, CMA,  July 14, 2010 8:25 AM

## 2010-10-07 NOTE — Assessment & Plan Note (Signed)
Summary: 4 MO ROV /NWS #   Vital Signs:  Patient profile:   60 year old female Height:      65 inches Weight:      181.75 pounds BMI:     30.35 O2 Sat:      94 % on Room air Temp:     97.3 degrees F oral Pulse rate:   78 / minute Pulse rhythm:   regular Resp:     16 per minute BP sitting:   122 / 70  (left arm)  Vitals Entered By: Lucious Groves (November 01, 2009 3:32 PM)  O2 Flow:  Room air CC: F/U--Pt states that she is doing better with seeing nutritionist./kb, Hypertension Management, Lipid Management Is Patient Diabetic? No Pain Assessment Patient in pain? no        Primary Care Provider:  Etta Grandchild MD  CC:  F/U--Pt states that she is doing better with seeing nutritionist./kb, Hypertension Management, and Lipid Management.  History of Present Illness: She returns and reports that she is making strides with her diet but has yet to start exercising. She weighed 190 lbs 2 months ago and now down to 181 lbs.  Hypertension History:      She denies headache, chest pain, palpitations, dyspnea with exertion, orthopnea, PND, peripheral edema, visual symptoms, neurologic problems, syncope, and side effects from treatment.  She notes no problems with any antihypertensive medication side effects.        Positive major cardiovascular risk factors include female age 43 years old or older, hyperlipidemia, and hypertension.  Negative major cardiovascular risk factors include no history of diabetes, negative family history for ischemic heart disease, and non-tobacco-user status.        Positive history for target organ damage include ASHD (either angina/prior MI/prior CABG).  Further assessment for target organ damage reveals no history of cardiac end-organ damage (CHF/LVH), stroke/TIA, peripheral vascular disease, renal insufficiency, or hypertensive retinopathy.    Lipid Management History:      Positive NCEP/ATP III risk factors include female age 13 years old or older,  hypertension, and ASHD (either angina/prior MI/prior CABG).  Negative NCEP/ATP III risk factors include non-diabetic, no family history for ischemic heart disease, non-tobacco-user status, no prior stroke/TIA, no peripheral vascular disease, and no history of aortic aneurysm.        The patient states that she knows about the "Therapeutic Lifestyle Change" diet.  Her compliance with the TLC diet is good.  The patient expresses understanding of adjunctive measures for cholesterol lowering.  Adjunctive measures started by the patient include fiber, ASA, limit alcohol consumpton, and weight reduction.  She expresses no side effects from her lipid-lowering medication.  The patient denies any symptoms to suggest myopathy or liver disease.      Current Medications (verified): 1)  Pravachol 40 Mg Tabs (Pravastatin Sodium) .Marland Kitchen.. 1 Once Daily 2)  Flonase 50 Mcg/act Susp (Fluticasone Propionate) .... Uad 3)  Proventil Hfa 108 (90 Base) Mcg/act Aers (Albuterol Sulfate) .... As Needed 4)  Patanol 0.1 % Soln (Olopatadine Hcl) .Marland Kitchen.. 1-2 Gtt Each Eye  Two Times A Day As Needed 5)  Alprazolam 0.25 Mg Tabs (Alprazolam) .... Take 1 Tablet As Needed 6)  Celebrex 200 Mg Caps (Celecoxib) .... Take 1 Tablet By Mouth Once A Day 7)  Prevacid 24hr 15 Mg Cpdr (Lansoprazole) .... 2 Once Daily 8)  Prempro 0.45-1.5 Mg Tabs (Conj Estrog-Medroxyprogest Ace) .... One Tablet By Mouth Every Pm 9)  Vitamin C Cr 1000 Mg Cr-Tabs (  Ascorbic Acid) .... One Tablet By Mouth Every Am 10)  L-Arginine 1000 Mg Tabs (Arginine) .... 3 Tabs Two Times A Day 11)  Cvs Vitamin D 2000 Unit Tabs (Cholecalciferol) .... One Every Day 12)  Multivitamins   Tabs (Multiple Vitamin) .... One Tablet Every Day 13)  Fish Oil  Oil (Fish Oil) .... 3000 Mg Every Day 14)  Calcium-Magnesium-Zinc 15)  Diovan 160 Mg Tabs (Valsartan) .... One By Mouth Once Daily 16)  L Citrulline 500mg  .... Take 1 Tablet By Mouth Two Times A Day 17)  Coq10 100 Mg Caps (Coenzyme Q10)  .Marland Kitchen.. 1 Once Daily 18)  Asa 81mg  .... Take 1 Tablet By Mouth Once A Day 19)  Desonide Lotion 20)  Luxiq 0.12 % Foam (Betamethasone Valerate) 21)  Miralax  Powd (Polyethylene Glycol 3350) .Marland KitchenMarland KitchenMarland Kitchen 17 Gms in 8 Ounces of Liquid Per Day, As Needed For Constipation 22)  Paxil Cr 25 Mg Xr24h-Tab (Paroxetine Hcl) .Marland Kitchen.. 1 By Mouth Qd  Allergies (verified): 1)  ! Demerol 2)  ! Augmentin 3)  ! * Latex  Past History:  Past Medical History: Reviewed history from 12/11/2008 and no changes required. Microvascular CAD Thyroid nodule DIVERTICULOSIS, COLON (ICD-562.10) HYPERLIPIDEMIA (ICD-272.4) CONJUNCTIVITIS, ALLERGIC (ICD-372.14) FATIGUE, ACUTE (ICD-780.79) ANGINA PECTORIS NEC/NOS (ICD-413.9) THYROID CANCER, HX OF (ICD-V10.87) DIVERTICULOSIS, COLON (ICD-562.10) SLEEP APNEA (ICD-780.57) DEGENERATIVE JOINT DISEASE (ICD-715.90) DEPRESSION/ANXIETY (ICD-300.4) ANEMIA, IRON DEFICIENCY (ICD-280.9) GERD (ICD-530.81) PALPITATIONS, HX OF (ICD-V12.50) ARTHRITIS (ICD-716.90) ASTHMA (ICD-493.90) HYPERTENSION (ICD-401.9)  HLA-B27 positive  Past Surgical History: Reviewed history from 12/11/2008 and no changes required. * BLADDER TACK BREAST BIOPSY, HX OF (ICD-V15.9) TUBAL LIGATION, HX OF (ICD-V26.51) APPENDECTOMY, HX OF (ICD-V45.79) * CAESAREAN SECTION X2 HX OF GANGLION CYST, HX OF REMOVED (ICD-V13.3) UNSPEC PERSONAL HX PRESENTING HAZARDS HEALTH (ICD-V15.9) * HX OF LEFT THYROID LOBECTOMY  Family History: Reviewed history from 12/11/2008 and no changes required. No FH of Colon Cancer: Family History of Diabetes: Uncles Family History of Heart Disease: Maternal Aunt Testicular Cancer-brother Family History of Arthritis Family History Hypertension Father died at 42 yoa due to CNS aneurysm Mother died at age 63 Lung Cancer  Social History: Reviewed history from 12/11/2008 and no changes required. Occupation: Runner, broadcasting/film/video Alcohol Use - no Daily Caffeine Use Illicit Drug Use - no Regular  exercise-yes Divorced   Review of Systems  The patient denies anorexia, fever, chest pain, abdominal pain, melena, hematochezia, severe indigestion/heartburn, hematuria, difficulty walking, enlarged lymph nodes, angioedema, and breast masses.   Endo:  Denies cold intolerance, excessive hunger, excessive thirst, excessive urination, heat intolerance, polyuria, and weight change.  Physical Exam  General:  alert, well-developed, well-nourished, well-hydrated, cooperative to examination, good hygiene, and overweight-appearing.   Head:  normocephalic, atraumatic, and no abnormalities observed.   Mouth:  good dentition, no gingival abnormalities, no dental plaque, pharynx pink and moist, no erythema, no exudates, and no posterior lymphoid hypertrophy.   Neck:  supple, full ROM, no masses, no thyromegaly, no thyroid nodules or tenderness, no JVD, no HJR, normal carotid upstroke, and no carotid bruits.   Lungs:  Normal respiratory effort, chest expands symmetrically. Lungs are clear to auscultation, no crackles or wheezes. Heart:  Normal rate and regular rhythm. S1 and S2 normal without gallop, murmur, click, rub or other extra sounds. Abdomen:  soft, non-tender, normal bowel sounds, no distention, no masses, no guarding, no rigidity, no rebound tenderness, no abdominal hernia, no inguinal hernia, no hepatomegaly, and no splenomegaly.   Msk:  normal ROM, no joint tenderness, no joint swelling, no joint warmth, and  no redness over joints.   Pulses:  R and L carotid,radial,femoral,dorsalis pedis and posterior tibial pulses are full and equal bilaterally Extremities:  No clubbing, cyanosis, edema, or deformity noted with normal full range of motion of all joints.   Neurologic:  No cranial nerve deficits noted. Station and gait are normal. Plantar reflexes are down-going bilaterally. DTRs are symmetrical throughout. Sensory, motor and coordinative functions appear intact. Skin:  turgor normal, color normal,  no rashes, no suspicious lesions, no ecchymoses, no petechiae, no purpura, solar damage, actinic keratosis, and seborrheic keratosis.   Cervical Nodes:  No lymphadenopathy noted Psych:  Cognition and judgment appear intact. Alert and cooperative with normal attention span and concentration. No apparent delusions, illusions, hallucinations   Impression & Recommendations:  Problem # 1:  HYPERGLYCEMIA (ICD-790.29) Assessment Unchanged  Orders: Venipuncture (51761) TLB-BMP (Basic Metabolic Panel-BMET) (80048-METABOL) TLB-Hepatic/Liver Function Pnl (80076-HEPATIC) TLB-A1C / Hgb A1C (Glycohemoglobin) (83036-A1C)  Labs Reviewed: Creat: 0.7 (07/05/2009)     Problem # 2:  HYPERLIPIDEMIA (ICD-272.4) Assessment: Unchanged  Her updated medication list for this problem includes:    Pravachol 40 Mg Tabs (Pravastatin sodium) .Marland Kitchen... 1 once daily    Niaspan 500 Mg Cr-tabs (Niacin (antihyperlipidemic)) ..... One by mouth at bedtime  Orders: Venipuncture (60737) TLB-BMP (Basic Metabolic Panel-BMET) (80048-METABOL) TLB-Hepatic/Liver Function Pnl (80076-HEPATIC) TLB-A1C / Hgb A1C (Glycohemoglobin) (83036-A1C)  Labs Reviewed: SGOT: 20 (11/16/2008)   SGPT: 22 (11/16/2008)  Lipid Goals: Chol Goal: 200 (02/19/2009)   HDL Goal: 40 (02/19/2009)   LDL Goal: 100 (02/19/2009)   TG Goal: 150 (02/19/2009)  Prior 10 Yr Risk Heart Disease: N/A (07/27/2008)   HDL:44.00 (02/19/2009)  LDL:77 (02/19/2009)  Chol:161 (02/19/2009)  Trig:200.0 (02/19/2009)  Problem # 3:  GERD (ICD-530.81) Assessment: Improved  Her updated medication list for this problem includes:    Prevacid 24hr 15 Mg Cpdr (Lansoprazole) .Marland Kitchen... 2 once daily  EGD: Location: [facility]   (05/22/2008)  Labs Reviewed: Hgb: 14.7 (11/16/2008)   Hct: 42.4 (11/16/2008)  Problem # 4:  HYPERTENSION (ICD-401.9) Assessment: Improved  Her updated medication list for this problem includes:    Diovan 160 Mg Tabs (Valsartan) ..... One  by mouth once daily  BP today: 122/70 Prior BP: 130/82 (07/05/2009)  Prior 10 Yr Risk Heart Disease: N/A (07/27/2008)  Labs Reviewed: K+: 4.3 (07/05/2009) Creat: : 0.7 (07/05/2009)   Chol: 161 (02/19/2009)   HDL: 44.00 (02/19/2009)   LDL: 77 (02/19/2009)   TG: 200.0 (02/19/2009)  Complete Medication List: 1)  Pravachol 40 Mg Tabs (Pravastatin sodium) .Marland Kitchen.. 1 once daily 2)  Flonase 50 Mcg/act Susp (Fluticasone propionate) .... Uad 3)  Proventil Hfa 108 (90 Base) Mcg/act Aers (Albuterol sulfate) .... As needed 4)  Patanol 0.1 % Soln (Olopatadine hcl) .Marland Kitchen.. 1-2 gtt each eye  two times a day as needed 5)  Alprazolam 0.25 Mg Tabs (Alprazolam) .... Take 1 tablet as needed 6)  Celebrex 200 Mg Caps (Celecoxib) .... Take 1 tablet by mouth once a day 7)  Prevacid 24hr 15 Mg Cpdr (Lansoprazole) .... 2 once daily 8)  Prempro 0.45-1.5 Mg Tabs (Conj estrog-medroxyprogest ace) .... One tablet by mouth every pm 9)  Vitamin C Cr 1000 Mg Cr-tabs (Ascorbic acid) .... One tablet by mouth every am 10)  L-arginine 1000 Mg Tabs (Arginine) .... 3 tabs two times a day 11)  Cvs Vitamin D 2000 Unit Tabs (Cholecalciferol) .... One every day 12)  Multivitamins Tabs (Multiple vitamin) .... One tablet every day 13)  Fish Oil Oil (Fish oil) .Marland KitchenMarland KitchenMarland Kitchen  3000 mg every day 14)  Calcium-magnesium-zinc  15)  Diovan 160 Mg Tabs (Valsartan) .... One by mouth once daily 16)  L Citrulline 500mg   .... Take 1 tablet by mouth two times a day 17)  Coq10 100 Mg Caps (Coenzyme q10) .Marland Kitchen.. 1 once daily 18)  Asa 81mg   .... Take 1 tablet by mouth once a day 19)  Desonide Lotion  20)  Luxiq 0.12 % Foam (Betamethasone valerate) 21)  Miralax Powd (Polyethylene glycol 3350) .Marland KitchenMarland Kitchen. 17 gms in 8 ounces of liquid per day, as needed for constipation 22)  Paxil Cr 25 Mg Xr24h-tab (Paroxetine hcl) .Marland Kitchen.. 1 by mouth qd 23)  Niaspan 500 Mg Cr-tabs (Niacin (antihyperlipidemic)) .... One by mouth at bedtime  Hypertension Assessment/Plan:      The patient's  hypertensive risk group is category C: Target organ damage and/or diabetes.  Today's blood pressure is 122/70.  Her blood pressure goal is < 140/90.  Lipid Assessment/Plan:      Based on NCEP/ATP III, the patient's risk factor category is "history of coronary disease, peripheral vascular disease, cerebrovascular disease, or aortic aneurysm".  The patient's lipid goals are as follows: Total cholesterol goal is 200; LDL cholesterol goal is 100; HDL cholesterol goal is 40; Triglyceride goal is 150.    Patient Instructions: 1)  Please schedule a follow-up appointment in 4 months. 2)  It is important that you exercise regularly at least 20 minutes 5 times a week. If you develop chest pain, have severe difficulty breathing, or feel very tired , stop exercising immediately and seek medical attention. 3)  You need to lose weight. Consider a lower calorie diet and regular exercise.  4)  It is important that your Diabetic A1c level is checked every 3 months. 5)  Check your Blood Pressure regularly. If it is above 130/90: you should make an appointment. Prescriptions: NIASPAN 500 MG CR-TABS (NIACIN (ANTIHYPERLIPIDEMIC)) One by mouth at bedtime  #56 x 0   Entered and Authorized by:   Etta Grandchild MD   Signed by:   Etta Grandchild MD on 11/01/2009   Method used:   Samples Given   RxID:   [id number]

## 2010-10-07 NOTE — Letter (Signed)
Summary: Lipid Letter  [facility]  [address] [place], [place] [address]   Phone: [phone]  Fax: [phone]    06/19/2010  Kayla Khan [address] [place], [place]  [address]  Dear Kayla Khan:  We have carefully reviewed your last lipid profile from 06/19/2010 and the results are noted below with a summary of recommendations for lipid management.    Cholesterol:       143     Goal: <200   HDL "good" Cholesterol:   69.62     Goal: >40   LDL "bad" Cholesterol:   74     Goal: <100   Triglycerides:       130.0     Goal: <150        TLC Diet (Therapeutic Lifestyle Change): Saturated Fats & Transfatty acids should be kept < 7% of total calories ***Reduce Saturated Fats Polyunstaurated Fat can be up to 10% of total calories Monounsaturated Fat Fat can be up to 20% of total calories Total Fat should be no greater than 25-35% of total calories Carbohydrates should be 50-60% of total calories Protein should be approximately 15% of total calories Fiber should be at least 20-30 grams a day ***Increased fiber may help lower LDL Total Cholesterol should be < 200mg /day Consider adding plant stanol/sterols to diet (example: Benacol spread) ***A higher intake of unsaturated fat may reduce Triglycerides and Increase HDL    Adjunctive Measures (may lower LIPIDS and reduce risk of Heart Attack) include: Aerobic Exercise (20-30 minutes 3-4 times a week) Limit Alcohol Consumption Weight Reduction Aspirin 75-81 mg a day by mouth (if not allergic or contraindicated) Dietary Fiber 20-30 grams a day by mouth     Current Medications: 1)    Pravachol 40 Mg Tabs (Pravastatin sodium) .Marland Kitchen.. 1 once daily 2)    Flonase 50 Mcg/act Susp (Fluticasone propionate) .... Uad 3)    Proventil Hfa 108 (90 Base) Mcg/act Aers (Albuterol sulfate) .... As needed 4)    Patanol 0.1 % Soln (Olopatadine hcl) .Marland Kitchen.. 1-2 gtt each eye  two times a day as needed 5)    Alprazolam 0.25 Mg  Tabs (Alprazolam) .... Take 1 by mouth two times a day prn 6)    Celebrex 200 Mg Caps (Celecoxib) .... Take 1 tablet by mouth once a day 7)    Prevacid 24hr 15 Mg Cpdr (Lansoprazole) .... 2 once daily 8)    Prempro 0.45-1.5 Mg Tabs (Conj estrog-medroxyprogest ace) .... One tablet by mouth every pm 9)    Vitamin C Cr 1000 Mg Cr-tabs (Ascorbic acid) .... One tablet by mouth every am 10)    L-arginine 1000 Mg Tabs (Arginine) .... 3 tabs two times a day 11)    Cvs Vitamin D 2000 Unit Tabs (Cholecalciferol) .... One every day 12)    Multivitamins   Tabs (Multiple vitamin) .... One tablet every day 13)    Fish Oil  Oil (Fish oil) .... 3000 mg every day 14)    Calcium-magnesium-zinc  15)    Diovan 160 Mg Tabs (Valsartan) .... One by mouth once daily 16)    L Citrulline 500mg   .... Take 1 tablet by mouth two times a day 17)    Coq10 100 Mg Caps (Coenzyme q10) .Marland Kitchen.. 1 once daily 18)    Aspirin 81 Mg Tbec (Aspirin) .... Take one tablet by mouth daily 19)    Desonide Lotion  20)    Luxiq 0.12 % Foam (Betamethasone valerate) 21)  Paxil Cr 25 Mg Xr24h-tab (Paroxetine hcl) .Marland Kitchen.. 1 by mouth qd 22)    Nizoral 2 % Sham (Ketoconazole) .... Use once daily  If you have any questions, please call. We appreciate being able to work with you.   Sincerely,    [facility] Etta Grandchild MD

## 2010-10-07 NOTE — Assessment & Plan Note (Signed)
Summary: f1y   Visit Type:  Follow-up Primary Provider:  Etta Grandchild MD  CC:  chest pain.  History of Present Illness:  The patient is seen for followup of a history of chest pain.  I saw her last April, 2010.  We know from catheterization in 2008 the patient has no significant epicardial disease.  There is been question of microvascular angina.  She's actually been stable.  She's not had any significant chest pain.  Current Medications (verified): 1)  Pravachol 40 Mg Tabs (Pravastatin Sodium) .Marland Kitchen.. 1 Once Daily 2)  Flonase 50 Mcg/act Susp (Fluticasone Propionate) .... Uad 3)  Proventil Hfa 108 (90 Base) Mcg/act Aers (Albuterol Sulfate) .... As Needed 4)  Patanol 0.1 % Soln (Olopatadine Hcl) .Marland Kitchen.. 1-2 Gtt Each Eye  Two Times A Day As Needed 5)  Alprazolam 0.25 Mg Tabs (Alprazolam) .... Take 1 Tablet As Needed 6)  Celebrex 200 Mg Caps (Celecoxib) .... Take 1 Tablet By Mouth Once A Day 7)  Prevacid 24hr 15 Mg Cpdr (Lansoprazole) .... 2 Once Daily 8)  Prempro 0.45-1.5 Mg Tabs (Conj Estrog-Medroxyprogest Ace) .... One Tablet By Mouth Every Pm 9)  Vitamin C Cr 1000 Mg Cr-Tabs (Ascorbic Acid) .... One Tablet By Mouth Every Am 10)  L-Arginine 1000 Mg Tabs (Arginine) .... 3 Tabs Two Times A Day 11)  Cvs Vitamin D 2000 Unit Tabs (Cholecalciferol) .... One Every Day 12)  Multivitamins   Tabs (Multiple Vitamin) .... One Tablet Every Day 13)  Fish Oil  Oil (Fish Oil) .... 3000 Mg Every Day 14)  Calcium-Magnesium-Zinc 15)  Diovan 160 Mg Tabs (Valsartan) .... One By Mouth Once Daily 16)  L Citrulline 500mg  .... Take 1 Tablet By Mouth Two Times A Day 17)  Coq10 100 Mg Caps (Coenzyme Q10) .Marland Kitchen.. 1 Once Daily 18)  Aspirin 81 Mg Tbec (Aspirin) .... Take One Tablet By Mouth Daily 19)  Desonide Lotion 20)  Luxiq 0.12 % Foam (Betamethasone Valerate) 21)  Miralax  Powd (Polyethylene Glycol 3350) .Marland KitchenMarland KitchenMarland Kitchen 17 Gms in 8 Ounces of Liquid Per Day, As Needed For Constipation 22)  Paxil Cr 25 Mg Xr24h-Tab (Paroxetine  Hcl) .Marland Kitchen.. 1 By Mouth Qd 23)  Niaspan 500 Mg Cr-Tabs (Niacin (Antihyperlipidemic)) .... One By Mouth At Bedtime  Allergies (verified): 1)  ! Demerol 2)  ! Augmentin 3)  ! * Latex  Past History:  Past Medical History: Last updated: 12/12/2009 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)   cath 2004..coronaries normal  /  nuclear  08/2007  normal EF  70% cath..2004  /   normal..nuclear..12/08  (no echo as of 4/7/20110 Thyroid nodule DIVERTICULOSIS, COLON (ICD-562.10) HYPERLIPIDEMIA (ICD-272.4) CONJUNCTIVITIS, ALLERGIC (ICD-372.14) FATIGUE, ACUTE (ICD-780.79) ANGINA PECTORIS NEC/NOS (ICD-413.9) THYROID CANCER, HX OF (ICD-V10.87) DIVERTICULOSIS, COLON (ICD-562.10) SLEEP APNEA (ICD-780.57) DEGENERATIVE JOINT DISEASE (ICD-715.90) DEPRESSION/ANXIETY (ICD-300.4) ANEMIA, IRON DEFICIENCY (ICD-280.9) GERD (ICD-530.81) PALPITATIONS, HX OF (ICD-V12.50) ARTHRITIS (ICD-716.90) ASTHMA (ICD-493.90) HYPERTENSION (ICD-401.9)  HLA-B27 positive  Review of Systems       Patient denies fever, chills, headache, sweats, rash, change in vision, change in hearing, chest pain, cough, nausea vomiting, urinary symptoms.  All other systems are reviewed and are negative  Vital Signs:  Patient profile:   60 year old female Height:      65 inches Weight:      178 pounds BMI:     29.73 Pulse rate:   73 / minute BP sitting:   138 / 76  (left arm) Cuff size:   regular  Vitals Entered By: Hardin Negus, RMA (December 13, 2009 3:38 PM)  Physical Exam  General:  patient is stable in general. Head:  head is atraumatic. Eyes:  no xanthelasma. Neck:  no jugular venous distention. Chest Wall:  no chest wall tenderness. Lungs:  lungs are clear.  Respiratory effort is nonlabored. Heart:  cardiac exam reveals S1-S2.  No clicks or significant murmurs. Abdomen:  abdomen is soft. Msk:  no musculoskeletal deformities. Extremities:  no peripheral edema. Skin:  no skin rashes. Psych:  patient is oriented to person time and  place affect is normal.   Impression & Recommendations:  Problem # 1:  CHEST PAIN (ICD-786.50)  Her updated medication list for this problem includes:    Aspirin 81 Mg Tbec (Aspirin) .Marland Kitchen... Take one tablet by mouth daily  Orders: EKG w/ Interpretation (93000) Patient has not been having any significant chest pain. EKG is done and reviewed by me.  Patient has decreased anterior R waves.  This is unchanged from the past.  No further workup is needed.  Problem # 2:  HYPERLIPIDEMIA (ICD-272.4)  Her updated medication list for this problem includes:    Pravachol 40 Mg Tabs (Pravastatin sodium) .Marland Kitchen... 1 once daily    Niaspan 500 Mg Cr-tabs (Niacin (antihyperlipidemic)) ..... One by mouth at bedtime Patient has been started on Niaspan.  The most recent labs I have are from June of 2010.  At that time her triglycerides were 200.  HDL was 44 and LDL was 77.  The patient does not have proven epicardial coronary disease.  It is reasonable to use Niaspan to be very aggressive with her meds.  I cannot comment further on the decision to use Niaspan at this point.  Problem # 3:  HYPERTENSION (ICD-401.9)  Her updated medication list for this problem includes:    Diovan 160 Mg Tabs (Valsartan) ..... One by mouth once daily    Aspirin 81 Mg Tbec (Aspirin) .Marland Kitchen... Take one tablet by mouth daily Blood pressure is controlled today.  No change in therapy.  Problem # 4:  HYPERGLYCEMIA (ICD-790.29) The patient says that she is motivated to try to lose weight.  Patient Instructions: 1)  Your physician recommends that you schedule a follow-up appointment in: 12 months

## 2010-10-07 NOTE — Letter (Signed)
Summary: [facility]  [facility]   Imported By: Sherian Rein 09/12/2009 09:31:36  _____________________________________________________________________  External Attachment:    Type:   Image     Comment:   External Document

## 2010-10-07 NOTE — Progress Notes (Signed)
Summary: REFERRAL  Phone Note Call from Patient   Summary of Call: Patient is requesting referral for CPAP supplies - She does not want [facility].  Initial call taken by: Lamar Sprinkles, CMA,  July 01, 2010 9:54 AM  Follow-up for Phone Call        New referral was entered into EMR.  Follow-up by: Lamar Sprinkles, CMA,  July 02, 2010 12:12 PM

## 2010-10-07 NOTE — Miscellaneous (Signed)
  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)   cath 2004..coronaries normal  /  nuclear  08/2007  normal EF  70% cath..2004  /   normal..nuclear..12/08  (no echo as of 4/7/20110 Thyroid nodule DIVERTICULOSIS, COLON (ICD-562.10) HYPERLIPIDEMIA (ICD-272.4) CONJUNCTIVITIS, ALLERGIC (ICD-372.14) FATIGUE, ACUTE (ICD-780.79) ANGINA PECTORIS NEC/NOS (ICD-413.9) THYROID CANCER, HX OF (ICD-V10.87) DIVERTICULOSIS, COLON (ICD-562.10) SLEEP APNEA (ICD-780.57) DEGENERATIVE JOINT DISEASE (ICD-715.90) DEPRESSION/ANXIETY (ICD-300.4) ANEMIA, IRON DEFICIENCY (ICD-280.9) GERD (ICD-530.81) PALPITATIONS, HX OF (ICD-V12.50) ARTHRITIS (ICD-716.90) ASTHMA (ICD-493.90) HYPERTENSION (ICD-401.9)  HLA-B27 positive      (12/12/2009 8:33) Added new observation of PRIMARY MD: Etta Grandchild MD (12/12/2009 8:33)       Past History:  Past Medical History: 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)   cath 2004..coronaries normal  /  nuclear  08/2007  normal EF  70% cath..2004  /   normal..nuclear..12/08  (no echo as of 4/7/20110 Thyroid nodule DIVERTICULOSIS, COLON (ICD-562.10) HYPERLIPIDEMIA (ICD-272.4) CONJUNCTIVITIS, ALLERGIC (ICD-372.14) FATIGUE, ACUTE (ICD-780.79) ANGINA PECTORIS NEC/NOS (ICD-413.9) THYROID CANCER, HX OF (ICD-V10.87) DIVERTICULOSIS, COLON (ICD-562.10) SLEEP APNEA (ICD-780.57) DEGENERATIVE JOINT DISEASE (ICD-715.90) DEPRESSION/ANXIETY (ICD-300.4) ANEMIA, IRON DEFICIENCY (ICD-280.9) GERD (ICD-530.81) PALPITATIONS, HX OF (ICD-V12.50) ARTHRITIS (ICD-716.90) ASTHMA (ICD-493.90) HYPERTENSION (ICD-401.9)  HLA-B27 positive

## 2010-10-09 NOTE — Progress Notes (Signed)
    PAP Screening:    Last PAP smear:  09/22/2010  PAP Smear Results:    Date of Exam:  09/22/2010    Results:  Normal  Mammogram Screening:    Last Mammogram:  09/12/2009  Osteoporosis Risk Assessment:  Risk Factors for Fracture or Low Bone Density:   Race (White or Asian):     yes   Hx of Fractures:       no   FH of Osteoporosis:     no   Hx of falls:       no   Physically inactive:     no   Smoking status:       never   High alcohol use:     no   Low calcium/Vit. D intake:   no   Corticosteroid use:     no   Heparin use:       no   Thyroid disease:     no   Rheumatoid Arthritis:     no  Immunization & Chemoprophylaxis:    Influenza vaccine: Fluvax 3+  (06/19/2010)

## 2010-10-09 NOTE — Progress Notes (Signed)
Summary: REFERRAL  Phone Note Call from Patient   Summary of Call: Pt has not recieved cpap supplies thru [facility]. She called and [facility] does not have order from December. New referral put in EMR.  Initial call taken by: Lamar Sprinkles, CMA,  October 02, 2010 12:17 PM

## 2010-12-01 ENCOUNTER — Ambulatory Visit: Payer: Self-pay | Admitting: *Deleted

## 2010-12-10 ENCOUNTER — Telehealth: Payer: Self-pay | Admitting: *Deleted

## 2010-12-10 DIAGNOSIS — G4730 Sleep apnea, unspecified: Secondary | ICD-10-CM

## 2010-12-10 NOTE — Telephone Encounter (Signed)
SPoke w/pt Last week - She has tried multiple times to get CPAP supplies from [facility]. The problem is that they need the last sleep study - it was 2005, which is too long ago (however I did send info to [facility]) and she will need referral for sleep study. I left vm for patient w/this info. Please put in referral for sleep study.

## 2010-12-10 NOTE — Telephone Encounter (Signed)
done

## 2010-12-12 ENCOUNTER — Other Ambulatory Visit: Payer: Self-pay | Admitting: Cardiology

## 2010-12-12 ENCOUNTER — Other Ambulatory Visit: Payer: Self-pay | Admitting: Internal Medicine

## 2010-12-15 ENCOUNTER — Telehealth: Payer: Self-pay | Admitting: Internal Medicine

## 2010-12-15 NOTE — Telephone Encounter (Signed)
Refill request for Prempro approved via telephone w/Christian at [facility]

## 2010-12-16 ENCOUNTER — Encounter: Payer: BC Managed Care – PPO | Attending: Internal Medicine | Admitting: *Deleted

## 2010-12-16 DIAGNOSIS — R7309 Other abnormal glucose: Secondary | ICD-10-CM | POA: Insufficient documentation

## 2010-12-16 DIAGNOSIS — Z713 Dietary counseling and surveillance: Secondary | ICD-10-CM | POA: Insufficient documentation

## 2010-12-16 DIAGNOSIS — E669 Obesity, unspecified: Secondary | ICD-10-CM | POA: Insufficient documentation

## 2010-12-19 ENCOUNTER — Encounter: Payer: Self-pay | Admitting: Cardiology

## 2010-12-23 ENCOUNTER — Ambulatory Visit (INDEPENDENT_AMBULATORY_CARE_PROVIDER_SITE_OTHER): Payer: BC Managed Care – PPO | Admitting: Cardiology

## 2010-12-23 ENCOUNTER — Encounter: Payer: Self-pay | Admitting: Cardiology

## 2010-12-23 DIAGNOSIS — E041 Nontoxic single thyroid nodule: Secondary | ICD-10-CM | POA: Insufficient documentation

## 2010-12-23 DIAGNOSIS — R079 Chest pain, unspecified: Secondary | ICD-10-CM | POA: Insufficient documentation

## 2010-12-23 DIAGNOSIS — I2089 Other forms of angina pectoris: Secondary | ICD-10-CM | POA: Insufficient documentation

## 2010-12-23 DIAGNOSIS — R9430 Abnormal result of cardiovascular function study, unspecified: Secondary | ICD-10-CM | POA: Insufficient documentation

## 2010-12-23 NOTE — Patient Instructions (Signed)
Your physician wants you to follow-up in:  2 years. You will receive a reminder letter in the mail two months in advance. If you don't receive a letter, please call our office to schedule the follow-up appointment.   

## 2010-12-23 NOTE — Progress Notes (Signed)
HPI The patient returns for followup of chest pain.  She is doing very well.  I saw her last April, 2011.  In the past we thought she might have microvascular angina.  Catheterization in 2004 revealed normal coronaries.  Nuclear scan in December, 2008 revealed no abnormalities.  Ejection fraction was 70% in the past.  She does very well.  I Allergies  Allergen Reactions  . ZOX:[id number]+XBJYNWGNF+AOZHYQMVHQ Acid+Aspartame   . Latex   . Meperidine Hcl     Current Outpatient Prescriptions  Medication Sig Dispense Refill  . albuterol (PROVENTIL HFA) 108 (90 BASE) MCG/ACT inhaler Inhale 2 puffs into the lungs every 6 (six) hours as needed.        . ALPRAZolam (XANAX) 0.25 MG tablet Take 0.25 mg by mouth 2 (two) times daily as needed.        . Arginine 1000 MG TABS Take 3 tablets by mouth 2 (two) times daily.        . Ascorbic Acid (VITAMIN C) 1000 MG tablet Take 1,000 mg by mouth daily.        Marland Kitchen aspirin 81 MG tablet Take 81 mg by mouth daily.        . Betamethasone Valerate (LUXIQ) 0.12 % foam Apply topically 2 (two) times daily.        Marland Kitchen CALCIUM-MAGNESIUM-ZINC PO Take 1 tablet by mouth daily.        . celecoxib (CELEBREX) 200 MG capsule Take 200 mg by mouth daily.        . Cholecalciferol (VITAMIN D) 2000 UNITS CAPS Take 1 capsule by mouth daily.        . Coenzyme Q10 (CO Q 10) 100 MG CAPS Take 1 tablet by mouth daily.        Marland Kitchen desonide (DESOWEN) 0.05 % lotion as directed.        Marland Kitchen DIOVAN 160 MG tablet TAKE 1 TABLET BY MOUTH ONCE DAILY  30 tablet  3  . fish oil-omega-3 fatty acids 1000 MG capsule Take 3 g by mouth daily.        . fluticasone (FLONASE) 50 MCG/ACT nasal spray 2 sprays by Nasal route daily.        . lansoprazole (PREVACID) 15 MG capsule Take 30 mg by mouth daily.        . Multiple Vitamin (MULTIVITAMIN) tablet Take 1 tablet by mouth daily.        . NON FORMULARY CITRULLINE 500 mg -- twice daily       . olopatadine (PATANOL) 0.1 % ophthalmic solution Place 1 drop into both  eyes 2 (two) times daily.        Marland Kitchen PARoxetine (PAXIL-CR) 25 MG 24 hr tablet Take 25 mg by mouth every morning.        . pravastatin (PRAVACHOL) 40 MG tablet Take 20 mg by mouth daily.       Marland Kitchen PREMPRO 0.45-1.5 MG per tablet TAKE 1 TABLET BY MOUTH ONCE DAILY  28 tablet  5  . ketoconazole (NIZORAL) 2 % shampoo Apply topically daily.          History   Social History  . Marital Status: Divorced    Spouse Name: N/A    Number of Children: N/A  . Years of Education: N/A   Occupational History  . teacher    Social History Main Topics  . Smoking status: Never Smoker   . Smokeless tobacco: Not on file  . Alcohol Use: No  . Drug Use: Not on file  .  Sexually Active: Not on file   Other Topics Concern  . Not on file   Social History Narrative  . No narrative on file    Family History  Problem Relation Age of Onset  . Diabetes    . Heart disease    . Cancer    . Arthritis    . Hypertension    . Aneurysm      Past Medical History  Diagnosis Date  . 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 Diverticulosis of colon   . HLD (hyperlipidemia)   . Allergic conjunctivitis   . Fatigue   . Angina pectoris   . Thyroid cancer   . Diverticulosis of colon   . Sleep apnea   . DJD (degenerative joint disease)   . Depression with anxiety   . Anemia, iron deficiency   . GERD (gastroesophageal reflux disease)   . Palpitations   . Arthritis   . Asthma   . HLA B27 positive   . Ejection fraction     70%, catheter 2004 / normal LV function nuclear, 2008 ( no echo data as of December 23, 2010)    Past Surgical History  Procedure Date  . Vesicovaginal fistula closure w/ tah   . Breast biopsy   . Tubal ligation   . Appendectomy   . Cesarean section     x2  . Ganglion cyst excision   . Thyroidectomy, partial     left    ROS  Patient denies fever, chills, headache, sweats, rash, change in vision, change in  hearing, chest pain, cough, nausea vomiting, urinary symptoms.  All of the systems are reviewed and are negative.  PHYSICAL EXAM Patient is stable.  She is overweight.  She is oriented to person time and place.  Affect is normal.  There is no xanthelasma.  Lungs are clear.  Respiratory effort is not labored.  Cardiac exam reveals S1 and S2.  There are no clicks or significant murmurs.  The abdomen is soft .  There is no peripheral edema.  Filed Vitals:   12/23/10 1601  BP: 120/72  Pulse: 70  Resp: 18  Height: 5\' 5"  (1.651 m)  Weight: 190 lb (86.183 kg)    EKG IIs done today and reviewed by me.  There is normal sinus rhythm.  There is no significant change.  There is nonspecific ST-T wave changes  ASSESSMENT & PLAN

## 2010-12-23 NOTE — Assessment & Plan Note (Signed)
I have carefully reviewed the history with the patient.  Intermittently in the chart over the years there has been question of thyroid cancer.  The patient does not have thyroid cancer.  She had a benign thyroid nodule that was removed.

## 2010-12-23 NOTE — Assessment & Plan Note (Signed)
The patient has not had any recurrent chest pain.  No further workup is needed at this time.  I'll see her back in 24 months.

## 2010-12-26 ENCOUNTER — Other Ambulatory Visit: Payer: Self-pay | Admitting: Internal Medicine

## 2011-01-20 NOTE — Assessment & Plan Note (Signed)
[facility]                            CARDIOLOGY OFFICE NOTE   Kayla Khan, Kayla Khan                      MRN:          [id number]  DATE:09/12/2007                            DOB:          [birth year]    Kayla Khan is doing well.  See my note of December 10 and also  July 28, 2007.  We decided to start her on a low dose of diltiazem.  She is having less chest discomfort.  As mentioned before, we think it  is possible that she could have microvascular angina or possibly spasm  although neither of these have been proven.  We have chosen to use  diltiazem and she is feeling better and is interested in pushing the  dose a little bit higher.  She asked me today if these other questions  have major implications concerning her cardiovascular status long-term.  I explained to her that we do not think that these issues necessarily  lead to the development of other major cardiac problems including  epicardial atherosclerosis.  However, it is recommended that she have  excellent primary prevention and this is under way.   The patient also mentions today that she went to give blood and her  hemoglobin is a little bit low.  I have strongly urged her to see Dr.  Smith Mince back soon concerning this.   PAST MEDICAL HISTORY:   ALLERGIES:  DEMEROL and AUGMENTIN.   MEDICATIONS:  Multivitamin, Flonase, aspirin, allergy shots every 4  weeks, Nexium, pravastatin, Paxil, Prempro, albuterol inhaler p.r.n.,  Celebrex and Diltiazem 120 daily, the dose to be changed to 180 daily.   OTHER MEDICAL PROBLEMS:  See the list on my note of July 28, 2007.   REVIEW OF SYSTEMS:  Other than the HPI her review of systems is  negative.   PHYSICAL EXAM:  The patient is oriented to person, time and place.  Affect is normal.  Blood pressure is 126/68 with a pulse of 80.  HEENT:  Reveals no xanthelasma.  She has normal extraocular motion.  There are no carotid bruits.  There  is no jugular venous distention.  LUNGS:  Clear.  Respiratory effort is not labored.  CARDIAC:  Reveals an S1 with an S2.  There are no clicks or significant  murmurs.  ABDOMEN:  Soft.  She has no peripheral edema.  No labs are done today.   The patient's diltiazem dose will be pushed up to 180 mg daily.  She did  mention that she may have had slight headaches. These resolved.  Hopefully they will not return with the slightly  higher dose of diltiazem. She will be in touch with Dr. Smith Mince about  the followup of her hemoglobin.  I will see her back in 6 weeks to  assess the change in her diltiazem.     Luis Abed, MD, [facility]  Electronically Signed    JDK/MedQ  DD: 09/12/2007  DT: 09/12/2007  Job #: [id number]   cc:   Talmadge Coventry, M.D.

## 2011-01-20 NOTE — Assessment & Plan Note (Signed)
[facility]                            CARDIOLOGY OFFICE NOTE   ASHANA, TULLO                      MRN:          [id number]  DATE:07/28/2007                            DOB:          [birth year]    Ms. Kayla Khan is referred for the reevaluation of chest discomfort. The  patient has had chest pain in the past. She was seen last in our office  in October 2004. She had seen Dr. Gerri Spore and then Dr. Samule Ohm over  time. She has had chest discomfort. There is no major radiation. There  is nausea, vomiting or diaphoresis, but there is a tightness in her  chest. She had had a Cardiolite in 2003 that was normal. She felt worse  over time and ultimately underwent cardiac catheterization. This study  was done by Dr. Samule Ohm in 2004. Her coronaries were normal. It was felt  at that time that her chest pain was not cardiac.   Over time, there has been question as to what might cause her chest  discomfort. The patient mentions that at one point she was on a beta  blocker. It was thought that this might be causing her symptoms.  Therefore, at this point, she feels that she may have had some chest  pain related to beta blockers. She also felt poorly with Norvasc in the  past. There has been some mild hypertension. Most recently, she was on  lisinopril and seemed to do well. Then, the question arose with the  return of chest pain as to whether or not lisinopril could be playing a  role. She has stopped the lisinopril. She is still having some chest  discomfort.   The patient does have definite asthma. She is on medications for this.  She takes Nexium for some reflux.   PAST MEDICAL HISTORY:  ALLERGIES:  DEMEROL AND AUGMENTIN.   MEDICATIONS:  1. Multivitamin.  2. Flonase.  3. Aspirin.  4. Allergy injection once weekly.  5. P.r.n. Singulair.  6. Nexium.  7. Alprazolam p.r.n.  8. Pravastatin 20.  9. Paxil CR 25.  10.Prempro.  11.Albuterol inhaler.  12.Eye drops.  13.Spiriva inhaler.  14.Qvar.  15.Fish oil.  16.Coenzyme Q10.  17.Calcium.  18.Magnesium.  19.Zinc.  20.Vitamin D.   OTHER MEDICAL PROBLEMS:  See the list below.   REVIEW OF SYSTEMS:  The patient has had some palpitations. She does have  some reflux as mentioned. She also has some arthritis and had been  Celebrex which she stopped. She asked me about this, and I told her that  I was comfortable with her using low-dose Celebrex from the cardiac view  point. The patient has also had some anxiety.   SOCIAL HISTORY:  The patient is divorced and has children. She works as  a Runner, broadcasting/film/video. She does not smoke.   FAMILY HISTORY:  There is no strong family history of coronary disease.   REVIEW OF SYSTEMS:  See the review of systems that I just gave you.   PHYSICAL EXAMINATION:  The patient today weighs 166 pounds. Blood  pressure is 154/82 with a pulse of  90. She reports her blood pressures  in a book that she had with her. Her pressure has been no higher than  approximately 145 for several months, even off of the lisinopril.  The patient is oriented to person, time and place. Affect is normal. She  is very inquisitive about her care.  HEENT:  Reveals no xanthelasma. She has normal extraocular motion.  There are no carotid bruits. There is no jugular venous distention.  LUNGS:  Are clear. Respiratory effort is not labored.  CARDIAC EXAM:  Reveals a S1 with a S2. There are no clicks or  significant murmurs.  The abdomen is soft. She has normal bowel sounds. She has 2+ distal  pulses. There is no peripheral edema. There are no musculoskeletal  deformities.   EKG reveals nonspecific ST-T wave abnormalities. She has mild decrease  in RV progression.   PROBLEMS INCLUDE:  1. HISTORY OF ALLERGY TO DEMEROL AND AUGMENTIN.  2. History of dyslipidemia, treated.  3. History of hypertension. For now, her blood pressure is controlled,      and we will not restart any medicines at  this time.  4. History of significant asthma that is treated.  5. History of some arthritis.  6. Rare palpitations.  7. History of thyroid surgery in the past.  8. History of some chest and arm discomfort. Cardiolite was normal in      2003, and catheterization was normal in 2004. Exact etiology at      this time is not clear. I discussed with her the possibility that      this could be coronary spasm or microvascular angina. In the past,      her Cardiolite did not show ischemia, and this argues against      microvascular angina. It might be helpful to try a medication that      helps with spasm of any type. The patient feels that she did poorly      with Norvasc in the past. This did not count out the possibility of      using verapamil or Cardizem. We have decided to proceed with a      stress Myoview scan. I will then see her back, and we will use this      information and make further decisions about the approach to her      care. She also asks about other etiologies of this pain, if it is      not cardiac. I mentioned that there is always the possibility that      she could have esophageal spasm.   The patient appears to be stable. I have encouraged her to go about full  activities, and I will see her back after her stress test.     Luis Abed, MD, [facility]  Electronically Signed    JDK/MedQ  DD: 07/28/2007  DT: 07/29/2007  Job #: [id number]   cc:   Talmadge Coventry, M.D.

## 2011-01-20 NOTE — Assessment & Plan Note (Signed)
[facility]                            CARDIOLOGY OFFICE NOTE   Kayla Khan, Kayla Khan                      MRN:          [id number]  DATE:11/15/2007                            DOB:          [birth year]    Kayla Khan is back for follow-up.  I saw her last on September 12, 2007.  At that time, we thought we were making some progress in helping her  chest discomfort with low-dose diltiazem.  The dose was increased.  Unfortunately, it really has not helped her pain.  She still has pain.  It is usually with exertion.  She describes it as a cramping sensation.  It lasts for 4-5 minutes.  She did have one episode where she felt some  discomfort into her back.  We know that she had a stress Myoview  revealing no significant ischemia.  She walked 6-1/2 minutes on the  treadmill with insignificant ST changes, and there was no change in the  nuclear scan.  She and I have talked about whether this could be some  type of microvascular angina, but certainly it is not classic.  We know  that she underwent catheterization in 2004.  That study was normal.  I  am not considering repeat catheterization this time, but it can be kept  in mind.   PAST MEDICAL HISTORY:   ALLERGIES:  DEMEROL AND AUGMENTIN.   MEDICATIONS:  1. Flonase.  2. Aspirin.  3. Allergy injection.  4. Nexium.  5. Pravastatin 20.  6. Paxil CR 25.  7. Prempro.  8. Albuterol inhaler p.r.n.  9. Diltiazem (to be stopped when she starts her Imdur 30).  10.Spiriva.   OTHER MEDICAL PROBLEMS:  See the list on my note of July 28, 2007.   REVIEW OF SYSTEMS:  Otherwise review of systems is negative.   PHYSICAL EXAMINATION:  Weight today is 175 pounds.  Blood pressure is  150/86 with a pulse of 87.  The patient is oriented to person, time, and  place.  Affect is normal.  HEENT:  Reveals no xanthelasma.  She has normal extraocular motion.  There are no carotid bruits.  There is no jugular venous  distention.  LUNGS:  Are clear.  Respiratory effort is not labored.  CARDIAC EXAM:  Reveals S1-S2.  There are no clicks or significant  murmurs.  ABDOMEN:  Is soft.  There is no peripheral edema.   PROBLEMS INCLUDE:  1. History of allergy to Demerol and Augmentin.  2. Hyperlipidemia, treated.  3. Hypertension.  Blood pressure is slightly up today.  4. History of significant asthma.  5. History of arthritis.  6. Severe palpitations.  7. History of thyroid surgery.  8. Chest and arm discomfort.  Cardiolite normal in 2003 and      catheterization normal in 2004.  More recent Cardiolite in December      2008 revealed no significant abnormalities.   She feels poorly with Norvasc.  There was question that Cardizem helped  a little bit but now at this point she feels it is not helping.  We have  not tried  verapamil.  We will make a change today and try Imdur.   I will see her back for follow-up.  We will continue to work through  ways to make her feel better.  At some point, I will have to ask the  lung doctors if there is any possibility that this could be a pulmonary  type of pain.     Luis Abed, MD, [facility]  Electronically Signed    JDK/MedQ  DD: 11/15/2007  DT: 11/16/2007  Job #: [id number]

## 2011-01-20 NOTE — Assessment & Plan Note (Signed)
[place] [facility]                            CARDIOLOGY OFFICE NOTE   Kayla Khan, Kayla Khan                      MRN:          [id number]  DATE:08/17/2007                            DOB:          1951-07-20    HISTORY OF PRESENT ILLNESS:  Kayla Khan is seen for follow up.  I had  seen her on July 28, 2007.  We decided to proceed with a stress  Myoview.  Scan showed no scar or ischemia and a normal ejection  fraction.  Today, she is recovering from an upper respiratory infection.  She still had some exertional chest discomfort with radiation into her  arm.  Etiology is not clear.  We cannot absolutely rule out  microvascular angina.  She has not tolerated Norvasc in the past, and we  will try a low-dose of Cardizem.  She will monitor her blood pressure.  We also talked about her weight, and she will follow weight watchers and  start to walk, and we will see what her blood pressure does with the  addition of Diltiazem.   PAST MEDICAL HISTORY:   ALLERGIES:  DEMEROL AND AUGMENTIN.   MEDICATIONS:  Flonase, aspirin, Nexium, pravastatin, Paxil, Prempro,  albuterol, Celebrex and diltiazem ER to be added today at 120 mg daily.   OTHER MEDICAL PROBLEMS:  See the list on my note of August 05, 2007.   REVIEW OF SYSTEMS:  She does have an upper respiratory infection from  which she is recovering.  Otherwise, review of systems is negative.   PHYSICAL EXAMINATION:  VITAL SIGNS:  Blood pressure toady 150/78, pulse  79.  GENERAL:  The patient is oriented to person, time and place.  Affect is  normal.  HEENT:  No xanthelasma.  She has normal extraocular motion.  She does  have a cough that is nonproductive.  LUNGS:  Clear.  Respiratory effort is not labored  CARDIAC:  S1, S2.  No clicks or significant murmurs.  EXTREMITIES:  She has no peripheral edema.   ASSESSMENT:  The patient is stable.  I believe she does not have any  signs of unstable angina.  She  may have a type of variant angina.  We  will try diltiazem and will see her for follow up.     Luis Abed, MD, [facility]  Electronically Signed    JDK/MedQ  DD: 08/17/2007  DT: 08/18/2007  Job #: [id number]

## 2011-01-20 NOTE — Assessment & Plan Note (Signed)
[facility]                            CARDIOLOGY OFFICE NOTE   SAMORA, JERNBERG                      MRN:          [id number]  DATE:06/18/2008                            DOB:          03-Mar-1951    Ms. Pates is doing very well.  She has had chest pain and we have  tried to adjust her medicines.  I have wondered whether there could be a  spasm component.  We know that she has normal coronaries and normal  Cardiolite overtime.  Diltiazem may have helped a little.  Today, she is  here and she tells me that she received a recommendation from a friend  to take L-Arginine and L-Citrulline.  She started taking this.  She did  have a similar time increase the dose of her Prempro.  She thinks that  the L-Arginine and L-Citrulline have had a dramatic effect on her and  she is very active now and exercising and feels remarkably better.   PAST MEDICAL HISTORY:   ALLERGIES:  See the chart for multiple allergies.   CURRENT MEDICATIONS:  1. Aspirin.  2. Flonase.  3. Nexium.  4. Pravachol.  5. Paxil.  6. Prempro, and a new dose since the last visit.  7. Albuterol.  8. Celebrex.  9. Diltiazem 120.  10.A combination of L-Arginine and L-Citrulline.   OTHER MEDICAL PROBLEMS:  See the list in the note of November 15, 2007.   REVIEW OF SYSTEMS:  She feels great and her review of systems is  negative.   PHYSICAL EXAMINATION:  VITAL SIGNS:  Blood pressure 145/88.  Pulse is  64.  GENERAL:  The patient is oriented to person, time, and place.  Affect is  normal.  HEENT:  No xanthelasma.  She has normal extraocular motion.  NECK:  There are no carotid bruits.  There is no jugular venous  distention.  LUNGS:  Clear.  Respiratory effort is not labored.  CARDIAC:  S1 with an S2.  There are no clicks or significant murmurs.  ABDOMEN:  Soft and benign.  EXTREMITIES:  She has no peripheral edema.   EKG reveals nonspecific ST-T wave changes.   Today's problem  is the followup of her chest and arm discomfort.  Cardiolite normal in 2003.  Catheterization normal in 2004.  Cardiolite  normal in 2008.  We then tried various nitrates and calcium blockers  with mixed response.  She now says that she is totally better with the  combination of L-Arginine and L-Citrulline.  It is of note that around  this time her Prempro dose also was increased.  We will see her back in  6 months.     Luis Abed, MD, [facility]  Electronically Signed    JDK/MedQ  DD: 06/18/2008  DT: 06/19/2008  Job #: [id number]

## 2011-01-20 NOTE — Assessment & Plan Note (Signed)
[facility]                            CARDIOLOGY OFFICE NOTE   AIREONA, TORELLI                      MRN:          [id number]  DATE:12/06/2007                            DOB:          [birth year]    Ms. Loose is back for followup.  We have been trying to adjust her  medicines for her chest discomfort.  When I saw her last, we try a low  dose of Imdur.  She definitely had headaches from it and could not take  it more than 1 week, and she stopped it.  She is feeling okay today.  She wonders if a slightly increased dose of her Prempro might be  appropriate.  I told her that I am in favor of this but that it would be  best handled through her gynecologist.   PAST MEDICAL HISTORY:   ALLERGIES:  DEMEROL and AUGMENTIN.   MEDICATIONS:  Multivitamin, Flonase, aspirin, allergy injections,  Nexium, Pravachol, Paxil, Prempro, Celebrex as needed, inhaler Spiriva.   OTHER MEDICAL PROBLEMS:  See the list on my note of November 15, 2007.   REVIEW OF SYSTEMS:  Other than the HPI, her Review of Systems is  negative.   PHYSICAL EXAMINATION:  VITAL SIGNS:  Blood pressure today is 150/84 with  a pulse of 70.  GENERAL:  The patient is oriented to person, time and place.  Affect is  normal.  HEENT:  Reveals no xanthelasma.  She has normal extraocular motion.  NECK:  There are no carotid bruits.  There is no jugular venous  distention.  LUNGS:  Clear.  Respiratory effort is not labored.  CARDIAC:  Exam reveals S1 with an S2.  There are no clicks or  significant murmurs.  EXTREMITIES:  She has no peripheral edema.   Problems are listed on the note of November 15, 2007.  Chest and arm  discomfort.  We will restart 120 mg of diltiazem, and she will be seeing  her gynecologist to consider a slightly higher dose of Prempro.  I will  see her back in 6 months.     Luis Abed, MD, [facility]  Electronically Signed    JDK/MedQ  DD: 12/06/2007  DT: 12/06/2007  Job  #: [id number]

## 2011-01-23 NOTE — Cardiovascular Report (Signed)
   [name], [name]                       ACCOUNT NO.:  [id number]   MEDICAL RECORD NO.:  [id number]                   PATIENT TYPE:  OIB   LOCATION:  2899                                 FACILITY:  [facility]   PHYSICIAN:  Kayla Khan, M.D.             DATE OF BIRTH:  04-22-1951   DATE OF PROCEDURE:  06/22/2003  DATE OF DISCHARGE:                              CARDIAC CATHETERIZATION   PROCEDURE:  Left heart catheterization, left ventriculography, coronary  angiography.   INDICATIONS:  Kayla Khan is a 60 year old lady with risk factors of  hypertension and dyslipidemia.  Over the past two years she has developed  progressive exertional bilateral forearm pain.  An ETT Cardiolite  demonstrated no evidence of ischemia, normal left ventricular systolic  function. Despite this reassuring study symptoms have progressed over the  past year, and she remains concerned that this may represent coronary  disease.  We discussed further diagnostic options, and the patient opted for  cardiac catheterization for definitive diagnosis.   PROCEDURAL TECHNIQUE:  Informed consent was obtained.  Under 1% lidocaine  local anesthesia, a 6-French sheath was placed in the right femoral artery  using the modified Seldinger technique.  Diagnostic angiography and  ventriculography were performed using JL4, JR4, and pigtail catheters.  The  patient tolerated the procedure well and was transferred to the holding room  in stable condition.  Sheaths are to be removed there.   COMPLICATIONS:  None.   FINDINGS:  1. LV:  135/3/13.  EF 70% without regional wall motion abnormality.  2. No aortic stenosis or mitral regurgitation.  3. Left main:  Angiographically normal.  4. LAD:  Moderate-sized vessel giving rise to two moderate-sized diagonal     branches.  It is angiographically normal.  5. Circumflex:  Moderate-sized vessel giving rise to a single relatively     large obtuse marginal.  It is  angiographically normal.  6. RCA:  Large, dominant vessel.  It is angiographically normal.   IMPRESSION/RECOMMENDATIONS:  1. Angiographically normal coronary arteries.  2. Normal left ventricular size and systolic function.  3. No aortic stenosis or mitral regurgitation.   I suspect noncardiac etiology to her bilateral arm discomfort.  I have  reassured the patient and have asked her to follow up with Dr. Smith Mince for  further evaluation.                                               Kayla Khan, M.D.    WED/MEDQ  D:  06/22/2003  T:  06/22/2003  Job:  [id number]   cc:   Talmadge Coventry, M.D.  [address]  [place]  [place] [address]  Fax: [phone]

## 2011-01-23 NOTE — Procedures (Signed)
[place]  Patient:    Kayla Khan, Kayla Khan Visit Number: [id number] MRN: [id number]          Service Type: END Location: ENDO Attending Physician:  Mervin Hack Dictated by:   Hedwig Morton. Juanda Chance, M.D. [facility] Admit Date:  02/17/2002 Discharge Date: 02/17/2002   CC:         Talmadge Coventry, M.D.   Procedure Report  PROCEDURE:  Upper endoscopy and colonoscopy.  SURGEON:  Hedwig Morton. Juanda Chance, M.D.  INDICATIONS:  This is a 60 year old white female who has had longstanding gastroesophageal reflux which has been adequately controlled with omeprazole 200 mg a day.  She has been on Vioxx 25 mg a day and aspirin 81 mg a day and on other medication.  She has not been able to remain asymptomatic without proton pump inhibitor.  Because of the long-standing reflux, she is undergoing upper endoscopy to rule out Barretts esophagus.  She is also undergoing colonoscopy because of change in the bowel habits, constipation, and bloating. On exam on Jan 10, 2002, her stool was Hemoccult negative.  ENDOSCOPE:  Olympus single channel video endoscope.  SEDATION:  Versed 6 mg IV and fentanyl 75 mcg IV.  FINDINGS:  The Olympus single channel video endoscope was passed under direct vision through the posterior pharynx and esophagus.  The patient was monitored by pulse oximeter and oxygen saturations were normal.  The proximal and distal esophageal mucosa was normal.  There was a normal squamocolumnar junction with no evidence of irregularity, mucosal inflammation.  Biopsies were taken from the Z line to rule out Barretts esophagus.  Stomach:  The stomach was insufflated with air and showed normal-appearing gastric mucosa, antrum, and pyloric outlet.  Retroflexion of endoscope revealed normal fundus and cardia.  Duodenum:  The duodenal bulb and descending duodenum were normal.  IMPRESSION:  Normal upper endoscopy of the esophagus, stomach, and duodenum, status  post biopsy at the gastroesophageal junction.  #2 - DESCRIPTION OF COLONOSCOPY:  ENDOSCOPE:  Olympus single channel video endoscope.  SEDATION:  Additional Versed 2.5 mg IV and fentanyl 25 mcg IV.  FINDINGS:  The Olympus single channel video endoscope was passed under direct vision towards the sigmoid colon.  The patient was again monitored by pulse oximeter and oxygen saturations were normal.  The rectal ampulla was unremarkable.  There were scattered diverticula through the sigmoid colon, but there was no obstruction.  The colon was somewhat tortuous and there was some difficulty in traversing through the hepatic flexure.  The splenic flexure, transverse colon, and hepatic flexure was unremarkable with normal ascending colon to the cecum.  The tip of the cecal pouch was not visualized, but the ileocecal valve appeared normal and most of the cecal pouch appeared normal as well.  The prep was excellent.  The colonoscope was then retracted and the colon decompressed.  The patient tolerated the procedure well.  IMPRESSION:  Mild diverticulosis of the left colon, no evidence of polyps.  PLAN:  Treat for irritable bowel syndrome with high fiber diet, fiber supplements, yearly Hemoccult cards, and repeat colonoscopy in 10 years. Dictated by:   Hedwig Morton. Juanda Chance, M.D. [facility] Attending Physician:  Mervin Hack DD:  02/17/02 TD:  02/19/02 Job: [id number]

## 2011-01-23 NOTE — Op Note (Signed)
NAME:  Kayla Khan, Kayla Khan                         ACCOUNT NO.:  [id number]   MEDICAL RECORD NO.:  [id number]                   PATIENT TYPE:  AMB   LOCATION:  DAY                                  FACILITY:  [facility]   PHYSICIAN:  Velora Heckler, M.D.                DATE OF BIRTH:  Oct 03, 1950   DATE OF PROCEDURE:  03/31/2004  DATE OF DISCHARGE:                                 OPERATIVE REPORT   PREOPERATIVE DIAGNOSIS:  Left thyroid nodule.   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   PROCEDURE:  Left thyroid lobectomy.   SURGEON:  Velora Heckler, M.D.   ASSISTANT:  Rose Phi. Maple Hudson, M.D.   ANESTHESIA:  General per Dr. Alanda Slim.   ESTIMATED BLOOD LOSS:  Minimal.   PREPARATION:  Betadine.   COMPLICATIONS:  None.   INDICATIONS:  Patient is a 60 year old white female who had undergone CT  scan of the chest for chest pain on February, 2005.  This demonstrated an  incidental finding of a nodule on the left lobe of the thyroid.  Ultrasound  demonstrated a 2.3 cm solid mass.  Fine needle aspiration was performed  which showed a follicular lesion.  Patient now comes to surgery for  excision.   BODY OF REPORT:  Procedure is done in OR #7 at the [facility].  Patient is brought to the operating room and placed in a supine  position on the operating room table.  Following administration of general  anesthesia, the patient is positioned and then prepped and draped in the  usual strict aseptic fashion.  After ascertaining that an adequate level of  anesthesia had been obtained, a Kocher incision is made with a #10 blade.  Dissection is carried down through the subcutaneous tissues and platysma.  Hemostasis is obtained with the electrocautery.  Skin flaps are developed  cephalad and caudad from the thyroid notch to the sternal notch.  A Horner  self-retaining retractor is placed for exposure.  Strap muscles are incised  from the midline.  The right thyroid  lobe was exposed.  On palpation, it is  slightly nodular but without dominant or discrete mass.  It is normal-sized.  Palpation of the left thyroid lobe shows a dominant mass in the inferior  pole.  Strap muscles are reflected laterally.  The middle thyroid vein is  divided between Ligaclips.  The gland is rolled medially.  The superior pole  is dissected out.  The superior pole vessels are ligated in continuity with  2-0 silk ties and mediums Ligaclips and divided. The gland is rolled further  anteriorly.  Branches of the inferior thyroid artery are divided between  small Ligaclips.  The recurrent nerve is identified and preserved.  Inferior  venous tributaries are divided between medium Ligaclips.  The gland is  rolled further up and onto the anterior trachea.  The ligament of Allyson Sabal was  transected with the electrocautery.  The isthmus is mobilized across the  midline.  The isthmus is then transected between hemostats and suture-  ligated with 3-0 Vicryl suture ligature.  The left thyroid lobe is submitted  fresh to pathology, where frozen section confirms a probable hyperplastic  nodule.   Good hemostasis is obtained in the left neck.  Surgicel is placed over the  area of the recurrent laryngeal nerve.  The strap muscles are reapproximated  in the midline with interrupted 3-0 Vicryl sutures.  The platysma is closed  with interrupted 3-0 Vicryl sutures.  The skin was closed with running 4-0  Vicryl subcuticular suture.  The wound is washed and dried, and Benzoin and  Steri-Strips are applied.  Sterile dressings are applied.  Patient is  awakened from anesthesia and brought to the recovery room in satisfactory  condition in stable condition.  The patient tolerated the procedure well.                                               Velora Heckler, M.D.    TMG/MEDQ  D:  03/31/2004  T:  03/31/2004  Job:  [id number]   cc:   Dorisann Frames, M.D.  [address], [place] [address]  Fax:  [phone]   Talmadge Coventry, M.D.  [address]  [place]  [place] [address]  Fax: [phone]

## 2011-03-16 ENCOUNTER — Telehealth: Payer: Self-pay | Admitting: *Deleted

## 2011-03-16 NOTE — Telephone Encounter (Signed)
Received approval letter from insurance comp Celebrex approved. Notified [facility] spoke with [place]...03/16/11@4 :39pm/LMB

## 2011-03-16 NOTE — Telephone Encounter (Signed)
MD sign form faxing form back to [employer] for approval...03/16/11@11 :43am/LMB

## 2011-03-16 NOTE — Telephone Encounter (Signed)
Received fax pt is needing PA on her Celebrex. Contacted insurance spoke with Eva/ rep. Faxing over form for md to complete. Case ID # [id number]....03/16/11@11 :09am/LMB

## 2011-04-16 ENCOUNTER — Other Ambulatory Visit: Payer: Self-pay | Admitting: Cardiology

## 2011-04-16 ENCOUNTER — Encounter: Payer: Self-pay | Admitting: *Deleted

## 2011-04-16 ENCOUNTER — Encounter: Payer: BC Managed Care – PPO | Attending: Internal Medicine | Admitting: *Deleted

## 2011-04-16 DIAGNOSIS — R7309 Other abnormal glucose: Secondary | ICD-10-CM | POA: Insufficient documentation

## 2011-04-16 DIAGNOSIS — Z713 Dietary counseling and surveillance: Secondary | ICD-10-CM | POA: Insufficient documentation

## 2011-04-16 NOTE — Progress Notes (Signed)
  Medical Nutrition Therapy:  Appt start time: 1600 end time:  1430.  Assessment:  Primary concerns today: weight management, pre-diabetes, and hypercholesterolemia.   MEDICATIONS: See updated list  DIETARY INTAKE:  24-hr recall:  B (8-9 AM): banana, 1 cup cheerios, skim, almonds  Snk ( AM): N/A  L (12 PM): Salad (side) with light ranch, light hamburger patty/bun OR Turkey/cheese sandwich w/ fruit Snk (4 PM): 1 pc fruit D (6-8 PM): Grilled chicken with vegetables OR eating out at restaurant   Snk (9-10 PM): ice cream OR cheerios w/ milk **Late night eating is a problem per pt and eating out more  Recent physical activity: Water aerobics (2-3 times/week) for 45-60 minutes (for the past 3-4 weeks)  Estimated energy needs: 1400-1500 calories 160-170 g carbohydrates 80-95 g protein 40-50 g fat   Progress Towards Goal(s):  In progress.   Nutritional Diagnosis:  [place]-3.3 Overweight/obesity As related to excessive carbohydrate portions.  As evidenced by by consuming >100% estimated carbohydrate needs due to frequent late night snacking.    Intervention:   Eat 3 meals/day, Avoid meal skipping   Increase protein rich foods  Follow "Plate Method" for portion control  Limit carbohydrate1-2 servings/meal   Choose more whole grains, lean protein, low-fat dairy, and fruits/non-starchy vegetables.   Aim for >30 min of physical activity daily  Limit sugar-sweetened beverages and concentrated sweets  Monitoring/Evaluation:  Dietary intake, exercise, glucose levels, and body weight in 3 month(s).

## 2011-04-16 NOTE — Patient Instructions (Signed)
Goals:  Eat 3 meals/day, Avoid meal skipping   Increase protein rich foods  Follow "Plate Method" for portion control  Limit carbohydrate1-2 servings/meal   Choose more whole grains, lean protein, low-fat dairy, and fruits/non-starchy vegetables.   Aim for >30 min of physical activity daily  Limit sugar-sweetened beverages and concentrated sweets   

## 2011-05-24 ENCOUNTER — Other Ambulatory Visit: Payer: Self-pay | Admitting: Internal Medicine

## 2011-06-06 ENCOUNTER — Other Ambulatory Visit: Payer: Self-pay | Admitting: Internal Medicine

## 2011-06-08 ENCOUNTER — Other Ambulatory Visit: Payer: Self-pay | Admitting: Internal Medicine

## 2011-06-26 ENCOUNTER — Other Ambulatory Visit: Payer: Self-pay | Admitting: Internal Medicine

## 2011-07-06 ENCOUNTER — Other Ambulatory Visit: Payer: Self-pay | Admitting: Internal Medicine

## 2011-07-13 ENCOUNTER — Encounter: Payer: BC Managed Care – PPO | Attending: Internal Medicine | Admitting: *Deleted

## 2011-07-13 ENCOUNTER — Encounter: Payer: Self-pay | Admitting: *Deleted

## 2011-07-13 DIAGNOSIS — Z713 Dietary counseling and surveillance: Secondary | ICD-10-CM | POA: Insufficient documentation

## 2011-07-13 DIAGNOSIS — R7309 Other abnormal glucose: Secondary | ICD-10-CM | POA: Insufficient documentation

## 2011-07-13 NOTE — Patient Instructions (Signed)
Goals:  Eat 3 meals/day, Avoid meal skipping   Increase protein rich foods  Follow "Plate Method" for portion control  Limit carbohydrate1-2 servings/meal   Choose more whole grains, lean protein, low-fat dairy, and fruits/non-starchy vegetables.   Aim for >30 min of physical activity daily  Limit sugar-sweetened beverages and concentrated sweets   

## 2011-07-13 NOTE — Progress Notes (Signed)
  Medical Nutrition Therapy: Appt start time: 1600 end time: 1630.   Assessment: Primary concerns today: weight management, pre-diabetes, and hypercholesterolemia. Jonda continues to work on weight loss yet over the past 2-3 months she has gained 2.3 lbs. Pt reports that she had to get a part-time job to "make ends meet" and had to let go of her [employer] and now has limited time to exercise. Dietary intake and eating habits appear to be about the same per pts food recall. Pt reports episodes of stress eating as she has noted increased stress with the loss of a family member and friend as well as the start of the school year.  MEDICATIONS: See updated list   DIETARY INTAKE:   24-hr recall:  B (8-9 AM): banana, 1 cup cheerios, skim, almonds  Snk ( AM): N/A  L (12 PM): Salad (side) with light ranch, light hamburger patty/bun OR Peanut butter sandwich w/ apple Snk (4 PM): 1 pc fruit OR Vending machine foods D (6-8 PM): Cereal w/ milk OR Grilled chicken with vegetables OR eating out at restaurant (3 times/week) Snk (9-10 PM): ice cream OR cheerios w/ milk  **Late night eating continues to be a problem since school started  Recent physical activity: No structured physical activity reported since August 2012.  Estimated energy needs:  1400-1500 calories  160-170 g carbohydrates  80-95 g protein  40-50 g fat   Progress Towards Goal(s): In progress.   Nutritional Diagnosis:  [place]-3.3 Overweight/obesity As related to excessive carbohydrate portions. As evidenced by by consuming >100% estimated carbohydrate needs due to frequent late night snacking.   Intervention:  Eat 3 meals/day, Avoid meal skipping  Increase protein rich foods  Follow "Plate Method" for portion control  Limit carbohydrate1-2 servings/meal  Choose more whole grains, lean protein, low-fat dairy, and fruits/non-starchy vegetables.  Aim for >30 min of physical activity daily  Limit sugar-sweetened beverages and  concentrated sweets   Monitoring/Evaluation: Dietary intake, exercise, glucose levels, and body weight PRN.

## 2011-08-21 ENCOUNTER — Other Ambulatory Visit: Payer: Self-pay | Admitting: Cardiology

## 2011-09-28 LAB — HM MAMMOGRAPHY: HM Mammogram: NORMAL

## 2011-10-19 ENCOUNTER — Encounter: Payer: Self-pay | Admitting: Internal Medicine

## 2011-10-19 ENCOUNTER — Ambulatory Visit (INDEPENDENT_AMBULATORY_CARE_PROVIDER_SITE_OTHER): Payer: BC Managed Care – PPO | Admitting: Internal Medicine

## 2011-10-19 ENCOUNTER — Other Ambulatory Visit (INDEPENDENT_AMBULATORY_CARE_PROVIDER_SITE_OTHER): Payer: BC Managed Care – PPO

## 2011-10-19 DIAGNOSIS — R7309 Other abnormal glucose: Secondary | ICD-10-CM

## 2011-10-19 DIAGNOSIS — E785 Hyperlipidemia, unspecified: Secondary | ICD-10-CM

## 2011-10-19 DIAGNOSIS — Z0000 Encounter for general adult medical examination without abnormal findings: Secondary | ICD-10-CM | POA: Insufficient documentation

## 2011-10-19 DIAGNOSIS — I10 Essential (primary) hypertension: Secondary | ICD-10-CM

## 2011-10-19 DIAGNOSIS — D509 Iron deficiency anemia, unspecified: Secondary | ICD-10-CM

## 2011-10-19 LAB — COMPREHENSIVE METABOLIC PANEL
AST: 16 U/L (ref 0–37)
Albumin: 3.8 g/dL (ref 3.5–5.2)
BUN: 23 mg/dL (ref 6–23)
Calcium: 9.4 mg/dL (ref 8.4–10.5)
Chloride: 107 mEq/L (ref 96–112)
Glucose, Bld: 102 mg/dL — ABNORMAL HIGH (ref 70–99)
Potassium: 4.2 mEq/L (ref 3.5–5.1)
Sodium: 140 mEq/L (ref 135–145)
Total Protein: 7.3 g/dL (ref 6.0–8.3)

## 2011-10-19 LAB — CBC WITH DIFFERENTIAL/PLATELET
Basophils Relative: 0.6 % (ref 0.0–3.0)
Hemoglobin: 14.9 g/dL (ref 12.0–15.0)
Lymphocytes Relative: 31.4 % (ref 12.0–46.0)
Monocytes Relative: 6.4 % (ref 3.0–12.0)
Neutro Abs: 3.5 10*3/uL (ref 1.4–7.7)
RBC: 4.47 Mil/uL (ref 3.87–5.11)
WBC: 5.9 10*3/uL (ref 4.5–10.5)

## 2011-10-19 LAB — IBC PANEL: Iron: 100 ug/dL (ref 42–145)

## 2011-10-19 LAB — TSH: TSH: 1.89 u[IU]/mL (ref 0.35–5.50)

## 2011-10-19 LAB — LIPID PANEL
LDL Cholesterol: 80 mg/dL (ref 0–99)
Total CHOL/HDL Ratio: 3

## 2011-10-19 NOTE — Assessment & Plan Note (Signed)
Exam done, labs ordered, vaccines were updated, pt ed material was given 

## 2011-10-19 NOTE — Assessment & Plan Note (Signed)
Her BP is well controlled, I will check her lytes and renal function today 

## 2011-10-19 NOTE — Assessment & Plan Note (Signed)
She has no s/s of blood loss, I will check her CBC and her iron level as well

## 2011-10-19 NOTE — Assessment & Plan Note (Signed)
She is doing well on pravastatin, I will check her FLP today

## 2011-10-19 NOTE — Progress Notes (Signed)
Subjective:    Patient ID: Kayla Khan, female    DOB: 01-29-1951, 61 y.o.   MRN: [id number]  Hypertension This is a chronic problem. The current episode started more than 1 year ago. The problem has been gradually improving since onset. The problem is controlled. Pertinent negatives include no anxiety, blurred vision, chest pain, headaches, malaise/fatigue, neck pain, orthopnea, palpitations, peripheral edema, PND, shortness of breath or sweats. There are no associated agents to hypertension. Past treatments include angiotensin blockers. The current treatment provides significant improvement. Compliance problems include exercise and diet.       Review of Systems  Constitutional: Negative for fever, chills, malaise/fatigue, diaphoresis, activity change, appetite change, fatigue and unexpected weight change.  HENT: Negative.  Negative for neck pain.   Eyes: Negative.  Negative for blurred vision.  Respiratory: Negative for apnea, cough, choking, chest tightness, shortness of breath, wheezing and stridor.   Cardiovascular: Negative.  Negative for chest pain, palpitations, orthopnea, leg swelling and PND.  Gastrointestinal: Negative for nausea, vomiting, abdominal pain, diarrhea, constipation, abdominal distention and anal bleeding.  Genitourinary: Negative.   Musculoskeletal: Negative.   Skin: Negative.   Neurological: Negative.  Negative for headaches.  Hematological: Negative.   Psychiatric/Behavioral: Negative.        Objective:   Physical Exam  Vitals reviewed. Constitutional: She is oriented to person, place, and time. She appears well-developed and well-nourished. No distress.  HENT:  Head: Normocephalic and atraumatic.  Mouth/Throat: Oropharynx is clear and moist. No oropharyngeal exudate.  Eyes: Conjunctivae are normal. Right eye exhibits no discharge. Left eye exhibits no discharge. No scleral icterus.  Neck: Normal range of motion. Neck supple. No JVD present. No  tracheal deviation present. No thyromegaly present.  Cardiovascular: Normal rate, regular rhythm, normal heart sounds and intact distal pulses.  Exam reveals no gallop and no friction rub.   No murmur heard. Pulmonary/Chest: Breath sounds normal. No stridor. No respiratory distress. She has no decreased breath sounds. She has no wheezes. She has no rhonchi. She has no rales. Chest wall is not dull to percussion. She exhibits no mass, no tenderness, no bony tenderness, no laceration, no crepitus, no edema, no deformity, no swelling and no retraction. Right breast exhibits no inverted nipple, no mass, no nipple discharge, no skin change and no tenderness. Left breast exhibits no inverted nipple, no mass, no nipple discharge, no skin change and no tenderness. Breasts are symmetrical.  Abdominal: Soft. Bowel sounds are normal. She exhibits no distension and no mass. There is no tenderness. There is no rebound and no guarding.  Musculoskeletal: Normal range of motion. She exhibits no edema and no tenderness.  Lymphadenopathy:    She has no cervical adenopathy.  Neurological: She is oriented to person, place, and time.  Skin: Skin is warm and dry. No rash noted. She is not diaphoretic. No erythema. No pallor.  Psychiatric: She has a normal mood and affect. Her behavior is normal. Judgment and thought content normal.      Lab Results  Component Value Date   WBC 5.2 06/19/2010   HGB 13.9 06/19/2010   HCT 40.0 06/19/2010   PLT 260.0 06/19/2010   GLUCOSE 100* 06/19/2010   CHOL 143 06/19/2010   TRIG 130.0 06/19/2010   HDL 43.10 06/19/2010   LDLCALC 74 06/19/2010   ALT 18 06/19/2010   AST 18 06/19/2010   NA 140 06/19/2010   K 4.3 06/19/2010   CL 101 06/19/2010   CREATININE 0.7 06/19/2010   BUN 23 06/19/2010  CO2 31 06/19/2010   TSH 1.41 06/19/2010   HGBA1C 5.9 06/19/2010      Assessment & Plan:

## 2011-10-19 NOTE — Assessment & Plan Note (Signed)
I will check her a1c today 

## 2011-10-19 NOTE — Patient Instructions (Signed)

## 2011-11-21 ENCOUNTER — Other Ambulatory Visit: Payer: Self-pay | Admitting: Internal Medicine

## 2011-12-23 ENCOUNTER — Other Ambulatory Visit: Payer: Self-pay | Admitting: Internal Medicine

## 2011-12-23 ENCOUNTER — Other Ambulatory Visit: Payer: Self-pay | Admitting: Cardiology

## 2012-02-20 ENCOUNTER — Other Ambulatory Visit: Payer: Self-pay | Admitting: Internal Medicine

## 2012-03-04 ENCOUNTER — Telehealth: Payer: Self-pay

## 2012-03-04 DIAGNOSIS — G4730 Sleep apnea, unspecified: Secondary | ICD-10-CM

## 2012-03-04 NOTE — Telephone Encounter (Signed)
Patient called [facility] requesting referral to sleep study

## 2012-04-15 ENCOUNTER — Institutional Professional Consult (permissible substitution): Payer: BC Managed Care – PPO | Admitting: Pulmonary Disease

## 2012-04-18 ENCOUNTER — Ambulatory Visit (INDEPENDENT_AMBULATORY_CARE_PROVIDER_SITE_OTHER): Payer: BC Managed Care – PPO | Admitting: Pulmonary Disease

## 2012-04-18 ENCOUNTER — Encounter: Payer: Self-pay | Admitting: Pulmonary Disease

## 2012-04-18 VITALS — BP 126/98 | HR 106 | Temp 98.4°F | Ht 65.0 in | Wt 189.0 lb

## 2012-04-18 DIAGNOSIS — G2581 Restless legs syndrome: Secondary | ICD-10-CM

## 2012-04-18 DIAGNOSIS — G4733 Obstructive sleep apnea (adult) (pediatric): Secondary | ICD-10-CM

## 2012-04-18 MED ORDER — ROPINIROLE HCL 0.5 MG PO TABS: ORAL_TABLET | ORAL | Status: DC

## 2012-04-18 NOTE — Patient Instructions (Addendum)
Will get you a new cpap machine set on same pressure, and will work on getting a cpap mask that won't be irritating to your skin. Work on weight loss Will try requip 0.5 mg after dinner to see if helps your abnormal leg sensations/movements. followup with me in 4mos, then yearly thereafter if doing well.

## 2012-04-18 NOTE — Progress Notes (Signed)
Subjective:    Patient ID: Kayla Khan, female    DOB: [birth year], 61 y.o.   MRN: [id number]  HPI The patient is a 61 year old female who been asked to see for management of obstructive sleep apnea.  The patient was diagnosed with mild sleep apnea in 2004, with an AHI of 10 events per hour.  She had a CPAP titration which optimized her pressure to 5 cm.  The patient has been on CPAP since that time with a very good response, however stopped using in December of 2012 because of a significant dermatitis involving the back of her head.  She was seen by a dermatologist, who felt it was secondary to the strap from the CPAP mask.  Since being off CPAP, she has returned to snoring, and has been told that she has an abnormal breathing pattern during sleep.  She has significant awakenings at night, and is not rested in the mornings upon arising.  She notes significant sleep pressure in the afternoon with inactivity, and will often take naps.  Because of this, she gets her second wind in the evenings.  The patient also notes an abnormal sensation in her legs that began in the late afternoon, and improved with movement.  Her sleep study in 2004 did show significant periodic limb movements.  The patient has had some improvement in these symptoms with various supplements, but continues to be an issue.  She states that her weight is stable over the last 2 years, and her Epworth score today is abnormal at 14.  Sleep Questionnaire: What time do you typically go to bed?( Between what hours) varies How long does it take you to fall asleep? varies How many times during the night do you wake up? What time do you get out of bed to start your day? 0530 Do you drive or operate heavy machinery in your occupation? No How much has your weight changed (up or down) over the past two years? (In pounds) 0 oz (0 kg) Have you ever had a sleep study before? Yes If yes, location of study? If yes, date of study? Do you currently use CPAP?  No Do you wear oxygen at any time? No     Review of Systems  Constitutional: Negative for fever and unexpected weight change.  HENT: Negative for ear pain, nosebleeds, congestion, sore throat, rhinorrhea, sneezing, trouble swallowing, dental problem, postnasal drip and sinus pressure.   Eyes: Negative for redness and itching.  Respiratory: Positive for shortness of breath. Negative for cough, chest tightness and wheezing.   Cardiovascular: Negative for palpitations and leg swelling.  Gastrointestinal: Negative for nausea and vomiting.  Genitourinary: Negative for dysuria.  Musculoskeletal: Negative for joint swelling.  Skin: Negative for rash.  Neurological: Negative for headaches.  Hematological: Does not bruise/bleed easily.  Psychiatric/Behavioral: Negative for dysphoric mood. The patient is not nervous/anxious.   All other systems reviewed and are negative.       Objective:   Physical Exam Constitutional:  Obese female, no acute distress  HENT:  Nares patent without discharge, but deviated septum to right with narrowing.   Oropharynx without exudate, palate and uvula are moderately elongated.   Eyes:  Perrla, eomi, no scleral icterus  Neck:  No JVD, no TMG  Cardiovascular:  Normal rate, regular rhythm, no rubs or gallops.  No murmurs        Intact distal pulses  Pulmonary :  Normal breath sounds, no stridor or respiratory distress   No rales, rhonchi,  or wheezing  Abdominal:  Soft, nondistended, bowel sounds present.  No tenderness noted.   Musculoskeletal:  No lower extremity edema noted.  Lymph Nodes:  No cervical lymphadenopathy noted  Skin:  No cyanosis noted  Neurologic:  Alert, appropriate, moves all 4 extremities without obvious deficit.         Assessment & Plan:

## 2012-04-18 NOTE — Assessment & Plan Note (Signed)
The patient's history is very suggestive of restless leg syndrome.  She has never been on a dopamine agonist, and I think we should give her a trial of this.

## 2012-04-18 NOTE — Assessment & Plan Note (Signed)
The patient has a history of mild sleep apnea dating back to 2004, and has done very well with her CPAP.  She feels very strongly this has helped her sleep and daytime alertness/energy level.  She only stopped using the mask because of irritation to her scalp, and her DME would no longer provide further supplies without having an order from a prescribing physician.  The patient is overdue for a new machine, and will arrange this.  We'll also have her DME work with her on a mask that won't irritate her skin.  I have also encouraged the patient to work aggressively on weight loss

## 2012-04-22 ENCOUNTER — Other Ambulatory Visit: Payer: Self-pay | Admitting: Cardiology

## 2012-06-19 ENCOUNTER — Other Ambulatory Visit: Payer: Self-pay | Admitting: Internal Medicine

## 2012-07-18 ENCOUNTER — Other Ambulatory Visit: Payer: Self-pay | Admitting: Internal Medicine

## 2012-08-18 ENCOUNTER — Encounter: Payer: Self-pay | Admitting: Pulmonary Disease

## 2012-08-18 ENCOUNTER — Ambulatory Visit (INDEPENDENT_AMBULATORY_CARE_PROVIDER_SITE_OTHER): Payer: BC Managed Care – PPO | Admitting: Pulmonary Disease

## 2012-08-18 VITALS — BP 136/78 | HR 72 | Temp 97.7°F | Ht 65.0 in | Wt 190.0 lb

## 2012-08-18 DIAGNOSIS — G4733 Obstructive sleep apnea (adult) (pediatric): Secondary | ICD-10-CM

## 2012-08-18 DIAGNOSIS — G2581 Restless legs syndrome: Secondary | ICD-10-CM

## 2012-08-18 NOTE — Assessment & Plan Note (Signed)
The patient has seen some improvement with low-dose Requip, but she has breakthrough at times.  She is on a low dose, and I have asked her to try and increase to 1 mg after supper on the nights she feels that she is going to have a problem.  She has had a history of iron deficiency in the past, but her labs earlier in the year showed this to be resolved.

## 2012-08-18 NOTE — Progress Notes (Signed)
  Subjective:    Patient ID: Kayla Khan, female    DOB: 10/19/1950, 61 y.o.   MRN: [id number]  HPI Patient comes in today for followup of her obstructive sleep apnea and also restless leg syndrome.  She is wearing CPAP compliantly, and is doing very well with the device.  She is having new mask or pressure issues, and feels that she sleeps well with adequate daytime alertness.  She has been trying Requip as needed for her RLS symptoms, and has seen some improvement, but not complete.   Review of Systems  Constitutional: Negative for fever and unexpected weight change.  HENT: Negative for ear pain, nosebleeds, congestion, sore throat, rhinorrhea, sneezing, trouble swallowing, dental problem, postnasal drip and sinus pressure.   Eyes: Negative for redness and itching.  Respiratory: Negative for cough, chest tightness, shortness of breath and wheezing.   Cardiovascular: Negative for palpitations and leg swelling.  Gastrointestinal: Negative for nausea and vomiting.  Genitourinary: Negative for dysuria.  Musculoskeletal: Negative for joint swelling.  Skin: Negative for rash.  Neurological: Negative for headaches.  Hematological: Does not bruise/bleed easily.  Psychiatric/Behavioral: Negative for dysphoric mood. The patient is not nervous/anxious.        Objective:   Physical Exam Overweight female in no acute distress Nose without purulence or discharge noted No skin breakdown or pressure necrosis from the CPAP mask Lower extremities with mild edema, no cyanosis Alert and oriented, does not appear to be sleepy, moves all 4 extremities.       Assessment & Plan:

## 2012-08-18 NOTE — Patient Instructions (Addendum)
Continue with cpap, and call if having tolerance issues. Work on weight reduction Try increasing requip to 1mg  total dose after supper to see if helps.  Let us know if you need a new prescription. followup with me in one year if doing well.

## 2012-08-18 NOTE — Assessment & Plan Note (Signed)
The patient is doing very well on her new CPAP set up, and feels that she is sleeping much better with improved daytime alertness.  I've encouraged her to work aggressively on weight loss.

## 2012-09-28 LAB — HM MAMMOGRAPHY: HM Mammogram: NORMAL

## 2012-11-17 ENCOUNTER — Other Ambulatory Visit: Payer: Self-pay | Admitting: Internal Medicine

## 2012-12-05 ENCOUNTER — Other Ambulatory Visit: Payer: Self-pay

## 2012-12-05 MED ORDER — VALSARTAN 160 MG PO TABS: ORAL_TABLET | ORAL | Status: DC

## 2012-12-07 ENCOUNTER — Encounter: Payer: Self-pay | Admitting: Cardiology

## 2012-12-07 DIAGNOSIS — R0789 Other chest pain: Secondary | ICD-10-CM | POA: Insufficient documentation

## 2012-12-07 DIAGNOSIS — R002 Palpitations: Secondary | ICD-10-CM | POA: Insufficient documentation

## 2012-12-15 ENCOUNTER — Ambulatory Visit (INDEPENDENT_AMBULATORY_CARE_PROVIDER_SITE_OTHER): Payer: BC Managed Care – PPO | Admitting: Cardiology

## 2012-12-15 ENCOUNTER — Encounter: Payer: Self-pay | Admitting: Cardiology

## 2012-12-15 VITALS — BP 132/78 | HR 75 | Ht 65.0 in | Wt 185.0 lb

## 2012-12-15 DIAGNOSIS — E785 Hyperlipidemia, unspecified: Secondary | ICD-10-CM

## 2012-12-15 DIAGNOSIS — R0789 Other chest pain: Secondary | ICD-10-CM

## 2012-12-15 DIAGNOSIS — I10 Essential (primary) hypertension: Secondary | ICD-10-CM

## 2012-12-15 MED ORDER — LOSARTAN POTASSIUM 50 MG PO TABS: 50.0000 mg | ORAL_TABLET | Freq: Every day | ORAL | Status: DC

## 2012-12-15 NOTE — Assessment & Plan Note (Signed)
Her lipids are being treated. No change in therapy. 

## 2012-12-15 NOTE — Patient Instructions (Addendum)
Your physician has recommended you make the following change in your medication: STOP your Diovan.   START Losartan the day after you stop your Diovan  Your physician wants you to follow-up in: 2 years.   You will receive a reminder letter in the mail two months in advance. If you don't receive a letter, please call our office to schedule the follow-up appointment.

## 2012-12-15 NOTE — Progress Notes (Signed)
HPI  Patient is seen to followup hypertension and a history of chest discomfort. In the past the patient had some chest discomfort. She had no significant coronary disease. Ultimately she started taking arginine. This appeared to help in the past and she has continued. She's done very well. I saw her last 2 years ago. She has been stable.  Allergies  Allergen Reactions  . Amoxicillin-Pot Clavulanate   . Latex   . Meperidine Hcl     Current Outpatient Prescriptions  Medication Sig Dispense Refill  . albuterol (PROVENTIL HFA) 108 (90 BASE) MCG/ACT inhaler Inhale 2 puffs into the lungs every 6 (six) hours as needed.        . ALPRAZolam (XANAX) 0.25 MG tablet Take 0.25 mg by mouth 2 (two) times daily as needed.        . Arginine 1000 MG TABS Take 3 tablets by mouth 2 (two) times daily.        . Ascorbic Acid (VITAMIN C) 1000 MG tablet Take 1,000 mg by mouth daily.        Marland Kitchen aspirin 81 MG tablet Take 81 mg by mouth daily.        . Betamethasone Valerate (LUXIQ) 0.12 % foam Apply topically 2 (two) times daily.        Marland Kitchen CALCIUM-MAGNESIUM-ZINC PO Take 1 tablet by mouth daily.        . Cholecalciferol (VITAMIN D) 2000 UNITS CAPS Take 1 capsule by mouth daily.        . Coenzyme Q10 (CO Q 10) 100 MG CAPS Take 1 tablet by mouth daily.        . fish oil-omega-3 fatty acids 1000 MG capsule Take 3 g by mouth daily.        . Multiple Vitamin (MULTIVITAMIN) tablet Take 1 tablet by mouth daily.        . NON FORMULARY CITRULLINE 500 mg -- twice daily       . olopatadine (PATANOL) 0.1 % ophthalmic solution Place 1 drop into both eyes 2 (two) times daily.        Marland Kitchen PARoxetine (PAXIL-CR) 25 MG 24 hr tablet take 1 tablet by mouth once daily  90 tablet  3  . pravastatin (PRAVACHOL) 40 MG tablet TAKE 1 TABLET BY MOUTH ONCE DAILY  30 tablet  5  . PREVACID 24HR 15 MG capsule take 1 capsule by mouth twice a day  180 capsule  3  . valsartan (DIOVAN) 160 MG tablet take 1 tablet by mouth once daily  30 tablet  2    No current facility-administered medications for this visit.    History   Social History  . Marital Status: Divorced    Spouse Name: N/A    Number of Children: N/A  . Years of Education: N/A   Occupational History  . teacher    Social History Main Topics  . Smoking status: Never Smoker   . Smokeless tobacco: Never Used  . Alcohol Use: No  . Drug Use: Not on file  . Sexually Active: Not Currently   Other Topics Concern  . Not on file   Social History Narrative  . No narrative on file    Family History  Problem Relation Age of Onset  . Diabetes    . Heart disease    . Cancer    . Arthritis    . Hypertension    . Aneurysm      Past Medical History  Diagnosis Date  . 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 Diverticulosis of colon   . HLD (hyperlipidemia)   . Allergic conjunctivitis   . Fatigue   . Angina pectoris   . Thyroid nodule     The patient had a thyroid nodule that was surgically removed in the past.  She has one half of her thyroid and does not take replacement of Taxol and he was benign  . Diverticulosis of colon   . Sleep apnea   . DJD (degenerative joint disease)   . Depression with anxiety   . Anemia, iron deficiency   . GERD (gastroesophageal reflux disease)   . Palpitations   . Arthritis   . Asthma   . HLA B27 positive   . Ejection fraction     70%, catheter 2004 / normal LV function nuclear, 2008 ( no echo data as of December 23, 2010)    Past Surgical History  Procedure Laterality Date  . Vesicovaginal fistula closure w/ tah    . Breast biopsy    . Tubal ligation    . Appendectomy    . Cesarean section      x2  . Ganglion cyst excision    . Thyroidectomy, partial      left    Patient Active Problem List  Diagnosis  . HYPERLIPIDEMIA  . ANEMIA, IRON DEFICIENCY  . DEPRESSION/ANXIETY  . HYPERTENSION  . ASTHMA  . GERD  . DEGENERATIVE  JOINT DISEASE  . OSA (obstructive sleep apnea)  . HYPERGLYCEMIA  . Ejection fraction  . Thyroid nodule  . Routine general medical examination at a health care facility  . RLS (restless legs syndrome)  . Palpitations  . Chest tightness or pressure    ROS   Patient denies fever, chills, headache, sweats, rash, change in vision, change in hearing, chest pain, cough, nausea vomiting, urinary symptoms. All other systems are reviewed and are negative.  PHYSICAL EXAM  Patient is oriented to person time and place. Affect is normal. There is no jugulovenous distention. Lungs are clear. Respiratory effort is nonlabored. Cardiac exam reveals S1 and S2. There no clicks or significant murmurs. The abdomen is soft. Is no peripheral edema.  Filed Vitals:   12/15/12 1541  BP: 132/78  Pulse: 75  Height: 5\' 5"  (1.651 m)  Weight: 185 lb (83.915 kg)    EKG is done today and reviewed by me. The patient has decreased anterior R-wave progression. This is unchanged from the tracings from the past. ASSESSMENT & PLAN

## 2012-12-15 NOTE — Assessment & Plan Note (Signed)
The patient is stable on valsartan 160 mg. I am changing her to losartan 50 mg to take advantage of generic pricing. She will be in touch if she has any difficulties.

## 2012-12-15 NOTE — Assessment & Plan Note (Signed)
The patient is not having any significant chest discomfort. It was presumed in the past that this could be microvascular angina. Cath in 2004 revealed normal coronaries. Nuclear in 2008 was normal. She continues to use arginine. This is her choice but it appears to help her

## 2013-01-24 ENCOUNTER — Encounter: Payer: Self-pay | Admitting: Internal Medicine

## 2013-01-24 ENCOUNTER — Other Ambulatory Visit (INDEPENDENT_AMBULATORY_CARE_PROVIDER_SITE_OTHER): Payer: BC Managed Care – PPO

## 2013-01-24 ENCOUNTER — Ambulatory Visit (INDEPENDENT_AMBULATORY_CARE_PROVIDER_SITE_OTHER): Payer: BC Managed Care – PPO | Admitting: Internal Medicine

## 2013-01-24 VITALS — BP 132/80 | HR 81 | Temp 98.0°F | Ht 65.0 in | Wt 181.5 lb

## 2013-01-24 DIAGNOSIS — J45909 Unspecified asthma, uncomplicated: Secondary | ICD-10-CM

## 2013-01-24 DIAGNOSIS — E890 Postprocedural hypothyroidism: Secondary | ICD-10-CM

## 2013-01-24 DIAGNOSIS — Z9009 Acquired absence of other part of head and neck: Secondary | ICD-10-CM

## 2013-01-24 DIAGNOSIS — Z9889 Other specified postprocedural states: Secondary | ICD-10-CM

## 2013-01-24 DIAGNOSIS — R197 Diarrhea, unspecified: Secondary | ICD-10-CM

## 2013-01-24 DIAGNOSIS — K219 Gastro-esophageal reflux disease without esophagitis: Secondary | ICD-10-CM

## 2013-01-24 DIAGNOSIS — E041 Nontoxic single thyroid nodule: Secondary | ICD-10-CM

## 2013-01-24 LAB — COMPREHENSIVE METABOLIC PANEL
ALT: 25 U/L (ref 0–35)
Albumin: 3.7 g/dL (ref 3.5–5.2)
CO2: 30 mEq/L (ref 19–32)
Calcium: 9.4 mg/dL (ref 8.4–10.5)
Chloride: 105 mEq/L (ref 96–112)
Creatinine, Ser: 0.8 mg/dL (ref 0.4–1.2)
GFR: 80.13 mL/min (ref >60.00)
Potassium: 4.3 mEq/L (ref 3.5–5.1)
Sodium: 140 mEq/L (ref 135–145)
Total Protein: 7.1 g/dL (ref 6.0–8.3)

## 2013-01-24 LAB — CBC
Hemoglobin: 13.7 g/dL (ref 12.0–15.0)
RDW: 12.9 % (ref 11.5–14.6)
WBC: 6.1 10*3/uL (ref 4.5–10.5)

## 2013-01-24 LAB — IGA: IgA: 404 mg/dL — ABNORMAL HIGH (ref 68–378)

## 2013-01-24 MED ORDER — ALBUTEROL SULFATE HFA 108 (90 BASE) MCG/ACT IN AERS: 2.0000 | INHALATION_SPRAY | Freq: Four times a day (QID) | RESPIRATORY_TRACT | Status: DC | PRN

## 2013-01-24 NOTE — Assessment & Plan Note (Signed)
Well controlled Refilled albuterol inhaler

## 2013-01-24 NOTE — Assessment & Plan Note (Signed)
Pt would like TSH checked today

## 2013-01-24 NOTE — Patient Instructions (Signed)
Diet for Diarrhea, Adult Having frequent, runny stools (diarrhea) has many causes. Diarrhea may be caused or worsened by food or drink. Diarrhea may be relieved by changing your diet. IF YOU ARE NOT TOLERATING SOLID FOODS:  Drink enough water and fluids to keep your urine clear or pale yellow.  Avoid sugary drinks and sodas as well as milk-based beverages.  Avoid beverages containing caffeine and alcohol.  You may try rehydrating beverages. You can make your own by following this recipe:   tsp table salt.   tsp baking soda.   tsp salt substitute (potassium chloride).  1 tbs + 1 tsp sugar.  1 qt water. As your stools become more solid, you can start eating solid foods. Add foods one at a time. If a certain food causes your diarrhea to get worse, avoid that food and try other foods. A low fiber, low-fat, and lactose-free diet is recommended. Small, frequent meals may be better tolerated.  Starches  Allowed:  White, French, and pita breads, plain rolls, buns, bagels. Plain muffins, matzo. Soda, saltine, or graham crackers. Pretzels, melba toast, zwieback. Cooked cereals made with water: cornmeal, farina, cream cereals. Dry cereals: refined corn, wheat, rice. Potatoes prepared any way without skins, refined macaroni, spaghetti, noodles, refined rice.  Avoid:  Bread, rolls, or crackers made with whole wheat, multi-grains, rye, bran seeds, nuts, or coconut. Corn tortillas or taco shells. Cereals containing whole grains, multi-grains, bran, coconut, nuts, or raisins. Cooked or dry oatmeal. Coarse wheat cereals, granola. Cereals advertised as "high-fiber." Potato skins. Whole grain pasta, wild or brown rice. Popcorn. Sweet potatoes/yams. Sweet rolls, doughnuts, waffles, pancakes, sweet breads. Vegetables  Allowed: Strained tomato and vegetable juices. Most well-cooked and canned vegetables without seeds. Fresh: Tender lettuce, cucumber without the skin, cabbage, spinach, bean  sprouts.  Avoid: Fresh, cooked, or canned: Artichokes, baked beans, beet greens, broccoli, Brussels sprouts, corn, kale, legumes, peas, sweet potatoes. Cooked: Green or red cabbage, spinach. Avoid large servings of any vegetables, because vegetables shrink when cooked, and they contain more fiber per serving than fresh vegetables. Fruit  Allowed: All fruit juices except prune juice. Cooked or canned: Apricots, applesauce, cantaloupe, cherries, fruit cocktail, grapefruit, grapes, kiwi, mandarin oranges, peaches, pears, plums, watermelon. Fresh: Apples without skin, ripe banana, grapes, cantaloupe, cherries, grapefruit, peaches, oranges, plums. Keep servings limited to  cup or 1 piece.  Avoid: Fresh: Apple with skin, apricots, mango, pears, raspberries, strawberries. Prune juice, stewed or dried prunes. Dried fruits, raisins, dates. Large servings of all fresh fruits. Meat and Meat Substitutes  Allowed: Ground or well-cooked tender beef, ham, veal, lamb, pork, or poultry. Eggs, plain cheese. Fish, oysters, shrimp, lobster, other seafoods. Liver, organ meats.  Avoid: Tough, fibrous meats with gristle. Peanut butter, smooth or chunky. Cheese, nuts, seeds, legumes, dried peas, beans, lentils. Milk  Allowed: Yogurt, lactose-free milk, kefir, drinkable yogurt, buttermilk, soy milk.  Avoid: Milk, chocolate milk, beverages made with milk, such as milk shakes. Soups  Allowed: Bouillon, broth, or soups made from allowed foods. Any strained soup.  Avoid: Soups made from vegetables that are not allowed, cream or milk-based soups. Desserts and Sweets  Allowed: Sugar-free gelatin, sugar-free frozen ice pops made without sugar alcohol.  Avoid: Plain cakes and cookies, pie made with allowed fruit, pudding, custard, cream pie. Gelatin, fruit, ice, sherbet, frozen ice pops. Ice cream, ice milk without nuts. Plain hard candy, honey, jelly, molasses, syrup, sugar, chocolate syrup, gumdrops,  marshmallows. Fats and Oils  Allowed: Avoid any fats and oils.  Avoid:   Seeds, nuts, olives, avocados. Margarine, butter, cream, mayonnaise, salad oils, plain salad dressings made from allowed foods. Plain gravy, crisp bacon without rind. Beverages  Allowed: Water, decaffeinated teas, oral rehydration solutions, sugar-free beverages.  Avoid: Fruit juices, caffeinated beverages (coffee, tea, soda or pop), alcohol, sports drinks, or lemon-lime soda or pop. Condiments  Allowed: Ketchup, mustard, horseradish, vinegar, cream sauce, cheese sauce, cocoa powder. Spices in moderation: allspice, basil, bay leaves, celery powder or leaves, cinnamon, cumin powder, curry powder, ginger, mace, marjoram, onion or garlic powder, oregano, paprika, parsley flakes, ground pepper, rosemary, sage, savory, tarragon, thyme, turmeric.  Avoid: Coconut, honey. Weight Monitoring: Weigh yourself every day. You should weigh yourself in the morning after you urinate and before you eat breakfast. Wear the same amount of clothing when you weigh yourself. Record your weight daily. Bring your recorded weights to your clinic visits. Tell your caregiver right away if you have gained 3 lb/1.4 kg or more in 1 day, 5 lb/2.3 kg in a week, or whatever amount you were told to report. SEEK IMMEDIATE MEDICAL CARE IF:   You are unable to keep fluids down.  You start to throw up (vomit) or diarrhea keeps coming back (persistent).  Abdominal pain develops, increases, or can be felt in one place (localizes).  You have an oral temperature above 102 F (38.9 C), not controlled by medicine.  Diarrhea contains blood or mucus.  You develop excessive weakness, dizziness, fainting, or extreme thirst. MAKE SURE YOU:   Understand these instructions.  Will watch your condition.  Will get help right away if you are not doing well or get worse. Document Released: 11/14/2003 Document Revised: 11/16/2011 Document Reviewed:  01/08/2012 [employer] Patient Information 2013 [employer].  

## 2013-01-24 NOTE — Progress Notes (Signed)
Subjective:    Patient ID: Kayla Khan, female    DOB: 03-17-1951, 62 y.o.   MRN: [id number]  HPI  Pt presents to the clinic today with c/o "digestive problems". She has c/o gas and diarrhea episodes intermittently for about 6 weeks. These episodes start with her having gas for about 8 hours that progresses to diarrhea for about 12hours. Her system seems to get cleaned out and then she has no symptoms for a week. Then the symptoms seem to reoccur.  She does have some mild abdominal cramping but no pain, nausea, vomiting or blood in her stool. She does have a history of GERD for which she takes Prevacid. This seems to control her symptoms. She has had a normal colonoscopy in 2012 with no evidence of diverticulosis. She denies fever chills or body aches. She has not taken any antidiarrheals OTC. At one point she decided to cut out gluten. This seemed to resolve her symptoms but once she started back on gluten her symptoms came right. She has no history of gluten allergy that she is aware of. She has lost 9 lbs in the las 6 months. Additionally today, she needs a refill of her albuterol. Her asthma is well controlled. She rarely uses her inhaler. She needs it refilled because the one she has is out of date.  Review of Systems      Past Medical History  Diagnosis Date  . 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 Diverticulosis of colon   . HLD (hyperlipidemia)   . Allergic conjunctivitis   . Fatigue   . Angina pectoris   . Thyroid nodule     The patient had a thyroid nodule that was surgically removed in the past.  She has one half of her thyroid and does not take replacement of Taxol and he was benign  . Diverticulosis of colon   . Sleep apnea   . DJD (degenerative joint disease)   . Depression with anxiety   . Anemia, iron deficiency   . GERD (gastroesophageal reflux disease)   . Palpitations   .  Arthritis   . Asthma   . HLA B27 positive   . Ejection fraction     70%, catheter 2004 / normal LV function nuclear, 2008 ( no echo data as of December 23, 2010)    Current Outpatient Prescriptions  Medication Sig Dispense Refill  . albuterol (PROVENTIL HFA) 108 (90 BASE) MCG/ACT inhaler Inhale 2 puffs into the lungs every 6 (six) hours as needed.        . ALPRAZolam (XANAX) 0.25 MG tablet Take 0.25 mg by mouth 2 (two) times daily as needed.        . Arginine 1000 MG TABS Take 3 tablets by mouth 2 (two) times daily.        . Ascorbic Acid (VITAMIN C) 1000 MG tablet Take 1,000 mg by mouth daily.        Marland Kitchen aspirin 81 MG tablet Take 81 mg by mouth daily.        . Betamethasone Valerate (LUXIQ) 0.12 % foam Apply topically 2 (two) times daily.        Marland Kitchen CALCIUM-MAGNESIUM-ZINC PO Take 1 tablet by mouth daily.        . Cholecalciferol (VITAMIN D) 2000 UNITS CAPS Take 1 capsule by mouth daily.        . Coenzyme Q10 (CO Q 10) 100  MG CAPS Take 1 tablet by mouth daily.        . fish oil-omega-3 fatty acids 1000 MG capsule Take 3 g by mouth daily.        Marland Kitchen losartan (COZAAR) 50 MG tablet Take 1 tablet (50 mg total) by mouth daily.  30 tablet  6  . Multiple Vitamin (MULTIVITAMIN) tablet Take 1 tablet by mouth daily.        . NON FORMULARY CITRULLINE 500 mg -- twice daily       . olopatadine (PATANOL) 0.1 % ophthalmic solution Place 1 drop into both eyes 2 (two) times daily.        Marland Kitchen PARoxetine (PAXIL-CR) 25 MG 24 hr tablet take 1 tablet by mouth once daily  90 tablet  3  . pravastatin (PRAVACHOL) 40 MG tablet TAKE 1 TABLET BY MOUTH ONCE DAILY  30 tablet  5  . PREVACID 24HR 15 MG capsule take 1 capsule by mouth twice a day  180 capsule  3   No current facility-administered medications for this visit.    Allergies  Allergen Reactions  . Amoxicillin-Pot Clavulanate   . Latex   . Meperidine Hcl     Family History  Problem Relation Age of Onset  . Diabetes    . Heart disease    . Cancer    .  Arthritis    . Hypertension    . Aneurysm      History   Social History  . Marital Status: Divorced    Spouse Name: N/A    Number of Children: N/A  . Years of Education: N/A   Occupational History  . teacher    Social History Main Topics  . Smoking status: Never Smoker   . Smokeless tobacco: Never Used  . Alcohol Use: No  . Drug Use: Not on file  . Sexually Active: Not Currently   Other Topics Concern  . Not on file   Social History Narrative  . No narrative on file     Constitutional: Denies fever, malaise, fatigue, headache or abrupt weight changes.   Cardiovascular: Denies chest pain, chest tightness, palpitations or swelling in the hands or feet.  Gastrointestinal: Pt reports diarrhea. Denies abdominal pain, bloating, constipation, or blood in the stool.  .   No other specific complaints in a complete review of systems (except as listed in HPI above).  Objective:   Physical Exam   BP 132/80  Pulse 81  Temp(Src) 98 F (36.7 C) (Oral)  Ht 5\' 5"  (1.651 m)  Wt 181 lb 8 oz (82.328 kg)  BMI 30.2 kg/m2  SpO2 93% Wt Readings from Last 3 Encounters:  01/24/13 181 lb 8 oz (82.328 kg)  12/15/12 185 lb (83.915 kg)  08/18/12 190 lb (86.183 kg)    General: Appears her stated age, overweight but well developed, well nourished in NAD.  Cardiovascular: Normal rate and rhythm. S1,S2 noted.  No murmur, rubs or gallops noted. No JVD or BLE edema. No carotid bruits noted. Pulmonary/Chest: Normal effort and positive vesicular breath sounds. No respiratory distress. No wheezes, rales or ronchi noted.  Abdomen: Soft and nontender. Normal bowel sounds, no bruits noted. No distention or masses noted. Liver, spleen and kidneys non palpable.      Assessment & Plan:   Diarrhea of unknown cause with additional workup required:  Will check CBC< CMET, TSH, A1C and celiac panel today Over the next few weeks, cut gluten out of your diet and monitor your symptoms Also  given  information regarding diets for diarrhea If lab work negative, may then proceed with CT abdomen to r/o diverticulitis  RTC as needed or if symptoms persist or worsen

## 2013-01-24 NOTE — Assessment & Plan Note (Signed)
Well controlled Continue prevacid at this time

## 2013-01-25 LAB — T-TRANSGLUTAMINASE (TTG) IGG: Tissue Transglut Ab: 2 U/mL (ref 0–5)

## 2013-03-22 ENCOUNTER — Encounter: Payer: Self-pay | Admitting: Internal Medicine

## 2013-03-22 ENCOUNTER — Other Ambulatory Visit (INDEPENDENT_AMBULATORY_CARE_PROVIDER_SITE_OTHER): Payer: BC Managed Care – PPO

## 2013-03-22 ENCOUNTER — Ambulatory Visit (INDEPENDENT_AMBULATORY_CARE_PROVIDER_SITE_OTHER): Payer: BC Managed Care – PPO | Admitting: Internal Medicine

## 2013-03-22 VITALS — BP 130/74 | HR 71 | Temp 97.8°F | Resp 16 | Ht 65.0 in | Wt 186.0 lb

## 2013-03-22 DIAGNOSIS — Z0000 Encounter for general adult medical examination without abnormal findings: Secondary | ICD-10-CM

## 2013-03-22 DIAGNOSIS — E669 Obesity, unspecified: Secondary | ICD-10-CM

## 2013-03-22 DIAGNOSIS — I10 Essential (primary) hypertension: Secondary | ICD-10-CM

## 2013-03-22 DIAGNOSIS — E785 Hyperlipidemia, unspecified: Secondary | ICD-10-CM

## 2013-03-22 DIAGNOSIS — Z23 Encounter for immunization: Secondary | ICD-10-CM

## 2013-03-22 LAB — LIPID PANEL
LDL Cholesterol: 84 mg/dL (ref 0–99)
Total CHOL/HDL Ratio: 3
VLDL: 22 mg/dL (ref 0.0–40.0)

## 2013-03-22 NOTE — Progress Notes (Signed)
  Subjective:    Patient ID: Kayla Khan, female    DOB: 08/28/1951, 62 y.o.   MRN: [id number]  Hyperlipidemia This is a chronic problem. The current episode started more than 1 year ago. The problem is controlled. Recent lipid tests were reviewed and are variable. Exacerbating diseases include obesity. She has no history of chronic renal disease, diabetes, hypothyroidism, liver disease or nephrotic syndrome. There are no known factors aggravating her hyperlipidemia. Pertinent negatives include no chest pain, focal sensory loss, focal weakness, leg pain, myalgias or shortness of breath. Current antihyperlipidemic treatment includes statins. The current treatment provides significant improvement of lipids. Compliance problems include adherence to exercise and adherence to diet.       Review of Systems  Constitutional: Negative.   HENT: Negative.   Eyes: Negative.   Respiratory: Negative.  Negative for shortness of breath.   Cardiovascular: Negative.  Negative for chest pain, palpitations and leg swelling.  Gastrointestinal: Negative.  Negative for nausea, abdominal pain, diarrhea and constipation.  Endocrine: Negative.   Genitourinary: Negative.   Musculoskeletal: Negative.  Negative for myalgias.  Skin: Negative.   Allergic/Immunologic: Negative.   Neurological: Negative.  Negative for focal weakness.  Hematological: Negative.   Psychiatric/Behavioral: Negative.        Objective:   Physical Exam  Vitals reviewed. Constitutional: She is oriented to person, place, and time. She appears well-developed and well-nourished. No distress.  HENT:  Head: Normocephalic and atraumatic.  Mouth/Throat: Oropharynx is clear and moist. No oropharyngeal exudate.  Eyes: Conjunctivae are normal. Right eye exhibits no discharge. Left eye exhibits no discharge. No scleral icterus.  Neck: Normal range of motion. Neck supple. No JVD present. No tracheal deviation present. No thyromegaly present.   Cardiovascular: Normal rate, regular rhythm, normal heart sounds and intact distal pulses.  Exam reveals no gallop and no friction rub.   No murmur heard. Pulmonary/Chest: Effort normal and breath sounds normal. No stridor. No respiratory distress. She has no wheezes. She has no rales. She exhibits no tenderness.  Abdominal: Soft. Bowel sounds are normal. She exhibits no distension and no mass. There is no tenderness. There is no rebound and no guarding.  Musculoskeletal: Normal range of motion. She exhibits no edema and no tenderness.  Lymphadenopathy:    She has no cervical adenopathy.  Neurological: She is oriented to person, place, and time.  Skin: Skin is warm and dry. No rash noted. She is not diaphoretic. No erythema. No pallor.  Psychiatric: She has a normal mood and affect. Her behavior is normal. Judgment and thought content normal.     Lab Results  Component Value Date   WBC 6.1 01/24/2013   HGB 13.7 01/24/2013   HCT 39.7 01/24/2013   PLT 240.0 01/24/2013   GLUCOSE 97 01/24/2013   CHOL 151 10/19/2011   TRIG 123.0 10/19/2011   HDL 46.20 10/19/2011   LDLCALC 80 10/19/2011   ALT 25 01/24/2013   AST 17 01/24/2013   NA 140 01/24/2013   K 4.3 01/24/2013   CL 105 01/24/2013   CREATININE 0.8 01/24/2013   BUN 20 01/24/2013   CO2 30 01/24/2013   TSH 2.00 01/24/2013   HGBA1C 5.8 01/24/2013       Assessment & Plan:

## 2013-03-22 NOTE — Assessment & Plan Note (Signed)
Goal achieved 

## 2013-03-22 NOTE — Assessment & Plan Note (Signed)
Exam done  Prevnar was given Labs reviewed, FLP ordered PT ed material was given

## 2013-03-22 NOTE — Patient Instructions (Signed)
Preventive Care for Adults, Female A healthy lifestyle and preventive care can promote health and wellness. Preventive health guidelines for women include the following key practices.  A routine yearly physical is a good way to check with your caregiver about your health and preventive screening. It is a chance to share any concerns and updates on your health, and to receive a thorough exam.  Visit your dentist for a routine exam and preventive care every 6 months. Brush your teeth twice a day and floss once a day. Good oral hygiene prevents tooth decay and gum disease.  The frequency of eye exams is based on your age, health, family medical history, use of contact lenses, and other factors. Follow your caregiver's recommendations for frequency of eye exams.  Eat a healthy diet. Foods like vegetables, fruits, whole grains, low-fat dairy products, and lean protein foods contain the nutrients you need without too many calories. Decrease your intake of foods high in solid fats, added sugars, and salt. Eat the right amount of calories for you.Get information about a proper diet from your caregiver, if necessary.  Regular physical exercise is one of the most important things you can do for your health. Most adults should get at least 150 minutes of moderate-intensity exercise (any activity that increases your heart rate and causes you to sweat) each week. In addition, most adults need muscle-strengthening exercises on 2 or more days a week.  Maintain a healthy weight. The body mass index (BMI) is a screening tool to identify possible weight problems. It provides an estimate of body fat based on height and weight. Your caregiver can help determine your BMI, and can help you achieve or maintain a healthy weight.For adults 20 years and older:  A BMI below 18.5 is considered underweight.  A BMI of 18.5 to 24.9 is normal.  A BMI of 25 to 29.9 is considered overweight.  A BMI of 30 and above is  considered obese.  Maintain normal blood lipids and cholesterol levels by exercising and minimizing your intake of saturated fat. Eat a balanced diet with plenty of fruit and vegetables. Blood tests for lipids and cholesterol should begin at age 20 and be repeated every 5 years. If your lipid or cholesterol levels are high, you are over 50, or you are at high risk for heart disease, you may need your cholesterol levels checked more frequently.Ongoing high lipid and cholesterol levels should be treated with medicines if diet and exercise are not effective.  If you smoke, find out from your caregiver how to quit. If you do not use tobacco, do not start.  If you are pregnant, do not drink alcohol. If you are breastfeeding, be very cautious about drinking alcohol. If you are not pregnant and choose to drink alcohol, do not exceed 1 drink per day. One drink is considered to be 12 ounces (355 mL) of beer, 5 ounces (148 mL) of wine, or 1.5 ounces (44 mL) of liquor.  Avoid use of street drugs. Do not share needles with anyone. Ask for help if you need support or instructions about stopping the use of drugs.  High blood pressure causes heart disease and increases the risk of stroke. Your blood pressure should be checked at least every 1 to 2 years. Ongoing high blood pressure should be treated with medicines if weight loss and exercise are not effective.  If you are 55 to 64 years old, ask your caregiver if you should take aspirin to prevent strokes.  Diabetes   screening involves taking a blood sample to check your fasting blood sugar level. This should be done once every 3 years, after age 45, if you are within normal weight and without risk factors for diabetes. Testing should be considered at a younger age or be carried out more frequently if you are overweight and have at least 1 risk factor for diabetes.  Breast cancer screening is essential preventive care for women. You should practice "breast  self-awareness." This means understanding the normal appearance and feel of your breasts and may include breast self-examination. Any changes detected, no matter how small, should be reported to a caregiver. Women in their 20s and 30s should have a clinical breast exam (CBE) by a caregiver as part of a regular health exam every 1 to 3 years. After age 40, women should have a CBE every year. Starting at age 40, women should consider having a mammography (breast X-ray test) every year. Women who have a family history of breast cancer should talk to their caregiver about genetic screening. Women at a high risk of breast cancer should talk to their caregivers about having magnetic resonance imaging (MRI) and a mammography every year.  The Pap test is a screening test for cervical cancer. A Pap test can show cell changes on the cervix that might become cervical cancer if left untreated. A Pap test is a procedure in which cells are obtained and examined from the lower end of the uterus (cervix).  Women should have a Pap test starting at age 21.  Between ages 21 and 29, Pap tests should be repeated every 2 years.  Beginning at age 30, you should have a Pap test every 3 years as long as the past 3 Pap tests have been normal.  Some women have medical problems that increase the chance of getting cervical cancer. Talk to your caregiver about these problems. It is especially important to talk to your caregiver if a new problem develops soon after your last Pap test. In these cases, your caregiver may recommend more frequent screening and Pap tests.  The above recommendations are the same for women who have or have not gotten the vaccine for human papillomavirus (HPV).  If you had a hysterectomy for a problem that was not cancer or a condition that could lead to cancer, then you no longer need Pap tests. Even if you no longer need a Pap test, a regular exam is a good idea to make sure no other problems are  starting.  If you are between ages 65 and 70, and you have had normal Pap tests going back 10 years, you no longer need Pap tests. Even if you no longer need a Pap test, a regular exam is a good idea to make sure no other problems are starting.  If you have had past treatment for cervical cancer or a condition that could lead to cancer, you need Pap tests and screening for cancer for at least 20 years after your treatment.  If Pap tests have been discontinued, risk factors (such as a new sexual partner) need to be reassessed to determine if screening should be resumed.  The HPV test is an additional test that may be used for cervical cancer screening. The HPV test looks for the virus that can cause the cell changes on the cervix. The cells collected during the Pap test can be tested for HPV. The HPV test could be used to screen women aged 30 years and older, and should   be used in women of any age who have unclear Pap test results. After the age of 30, women should have HPV testing at the same frequency as a Pap test.  Colorectal cancer can be detected and often prevented. Most routine colorectal cancer screening begins at the age of 50 and continues through age 75. However, your caregiver may recommend screening at an earlier age if you have risk factors for colon cancer. On a yearly basis, your caregiver may provide home test kits to check for hidden blood in the stool. Use of a small camera at the end of a tube, to directly examine the colon (sigmoidoscopy or colonoscopy), can detect the earliest forms of colorectal cancer. Talk to your caregiver about this at age 50, when routine screening begins. Direct examination of the colon should be repeated every 5 to 10 years through age 75, unless early forms of pre-cancerous polyps or small growths are found.  Hepatitis C blood testing is recommended for all people born from 1945 through 1965 and any individual with known risks for hepatitis C.  Practice  safe sex. Use condoms and avoid high-risk sexual practices to reduce the spread of sexually transmitted infections (STIs). STIs include gonorrhea, chlamydia, syphilis, trichomonas, herpes, HPV, and human immunodeficiency virus (HIV). Herpes, HIV, and HPV are viral illnesses that have no cure. They can result in disability, cancer, and death. Sexually active women aged 25 and younger should be checked for chlamydia. Older women with new or multiple partners should also be tested for chlamydia. Testing for other STIs is recommended if you are sexually active and at increased risk.  Osteoporosis is a disease in which the bones lose minerals and strength with aging. This can result in serious bone fractures. The risk of osteoporosis can be identified using a bone density scan. Women ages 65 and over and women at risk for fractures or osteoporosis should discuss screening with their caregivers. Ask your caregiver whether you should take a calcium supplement or vitamin D to reduce the rate of osteoporosis.  Menopause can be associated with physical symptoms and risks. Hormone replacement therapy is available to decrease symptoms and risks. You should talk to your caregiver about whether hormone replacement therapy is right for you.  Use sunscreen with sun protection factor (SPF) of 30 or more. Apply sunscreen liberally and repeatedly throughout the day. You should seek shade when your shadow is shorter than you. Protect yourself by wearing long sleeves, pants, a wide-brimmed hat, and sunglasses year round, whenever you are outdoors.  Once a month, do a whole body skin exam, using a mirror to look at the skin on your back. Notify your caregiver of new moles, moles that have irregular borders, moles that are larger than a pencil eraser, or moles that have changed in shape or color.  Stay current with required immunizations.  Influenza. You need a dose every fall (or winter). The composition of the flu vaccine  changes each year, so being vaccinated once is not enough.  Pneumococcal polysaccharide. You need 1 to 2 doses if you smoke cigarettes or if you have certain chronic medical conditions. You need 1 dose at age 65 (or older) if you have never been vaccinated.  Tetanus, diphtheria, pertussis (Tdap, Td). Get 1 dose of Tdap vaccine if you are younger than age 65, are over 65 and have contact with an infant, are a healthcare worker, are pregnant, or simply want to be protected from whooping cough. After that, you need a Td   booster dose every 10 years. Consult your caregiver if you have not had at least 3 tetanus and diphtheria-containing shots sometime in your life or have a deep or dirty wound.  HPV. You need this vaccine if you are a woman age 26 or younger. The vaccine is given in 3 doses over 6 months.  Measles, mumps, rubella (MMR). You need at least 1 dose of MMR if you were born in 1957 or later. You may also need a second dose.  Meningococcal. If you are age 19 to 21 and a first-year college student living in a residence hall, or have one of several medical conditions, you need to get vaccinated against meningococcal disease. You may also need additional booster doses.  Zoster (shingles). If you are age 60 or older, you should get this vaccine.  Varicella (chickenpox). If you have never had chickenpox or you were vaccinated but received only 1 dose, talk to your caregiver to find out if you need this vaccine.  Hepatitis A. You need this vaccine if you have a specific risk factor for hepatitis A virus infection or you simply wish to be protected from this disease. The vaccine is usually given as 2 doses, 6 to 18 months apart.  Hepatitis B. You need this vaccine if you have a specific risk factor for hepatitis B virus infection or you simply wish to be protected from this disease. The vaccine is given in 3 doses, usually over 6 months. Preventive Services / Frequency Ages 19 to 39  Blood  pressure check.** / Every 1 to 2 years.  Lipid and cholesterol check.** / Every 5 years beginning at age 20.  Clinical breast exam.** / Every 3 years for women in their 20s and 30s.  Pap test.** / Every 2 years from ages 21 through 29. Every 3 years starting at age 30 through age 65 or 70 with a history of 3 consecutive normal Pap tests.  HPV screening.** / Every 3 years from ages 30 through ages 65 to 70 with a history of 3 consecutive normal Pap tests.  Hepatitis C blood test.** / For any individual with known risks for hepatitis C.  Skin self-exam. / Monthly.  Influenza immunization.** / Every year.  Pneumococcal polysaccharide immunization.** / 1 to 2 doses if you smoke cigarettes or if you have certain chronic medical conditions.  Tetanus, diphtheria, pertussis (Tdap, Td) immunization. / A one-time dose of Tdap vaccine. After that, you need a Td booster dose every 10 years.  HPV immunization. / 3 doses over 6 months, if you are 26 and younger.  Measles, mumps, rubella (MMR) immunization. / You need at least 1 dose of MMR if you were born in 1957 or later. You may also need a second dose.  Meningococcal immunization. / 1 dose if you are age 19 to 21 and a first-year college student living in a residence hall, or have one of several medical conditions, you need to get vaccinated against meningococcal disease. You may also need additional booster doses.  Varicella immunization.** / Consult your caregiver.  Hepatitis A immunization.** / Consult your caregiver. 2 doses, 6 to 18 months apart.  Hepatitis B immunization.** / Consult your caregiver. 3 doses usually over 6 months. Ages 40 to 64  Blood pressure check.** / Every 1 to 2 years.  Lipid and cholesterol check.** / Every 5 years beginning at age 20.  Clinical breast exam.** / Every year after age 40.  Mammogram.** / Every year beginning at age 40   and continuing for as long as you are in good health. Consult with your  caregiver.  Pap test.** / Every 3 years starting at age 30 through age 65 or 70 with a history of 3 consecutive normal Pap tests.  HPV screening.** / Every 3 years from ages 30 through ages 65 to 70 with a history of 3 consecutive normal Pap tests.  Fecal occult blood test (FOBT) of stool. / Every year beginning at age 50 and continuing until age 75. You may not need to do this test if you get a colonoscopy every 10 years.  Flexible sigmoidoscopy or colonoscopy.** / Every 5 years for a flexible sigmoidoscopy or every 10 years for a colonoscopy beginning at age 50 and continuing until age 75.  Hepatitis C blood test.** / For all people born from 1945 through 1965 and any individual with known risks for hepatitis C.  Skin self-exam. / Monthly.  Influenza immunization.** / Every year.  Pneumococcal polysaccharide immunization.** / 1 to 2 doses if you smoke cigarettes or if you have certain chronic medical conditions.  Tetanus, diphtheria, pertussis (Tdap, Td) immunization.** / A one-time dose of Tdap vaccine. After that, you need a Td booster dose every 10 years.  Measles, mumps, rubella (MMR) immunization. / You need at least 1 dose of MMR if you were born in 1957 or later. You may also need a second dose.  Varicella immunization.** / Consult your caregiver.  Meningococcal immunization.** / Consult your caregiver.  Hepatitis A immunization.** / Consult your caregiver. 2 doses, 6 to 18 months apart.  Hepatitis B immunization.** / Consult your caregiver. 3 doses, usually over 6 months. Ages 65 and over  Blood pressure check.** / Every 1 to 2 years.  Lipid and cholesterol check.** / Every 5 years beginning at age 20.  Clinical breast exam.** / Every year after age 40.  Mammogram.** / Every year beginning at age 40 and continuing for as long as you are in good health. Consult with your caregiver.  Pap test.** / Every 3 years starting at age 30 through age 65 or 70 with a 3  consecutive normal Pap tests. Testing can be stopped between 65 and 70 with 3 consecutive normal Pap tests and no abnormal Pap or HPV tests in the past 10 years.  HPV screening.** / Every 3 years from ages 30 through ages 65 or 70 with a history of 3 consecutive normal Pap tests. Testing can be stopped between 65 and 70 with 3 consecutive normal Pap tests and no abnormal Pap or HPV tests in the past 10 years.  Fecal occult blood test (FOBT) of stool. / Every year beginning at age 50 and continuing until age 75. You may not need to do this test if you get a colonoscopy every 10 years.  Flexible sigmoidoscopy or colonoscopy.** / Every 5 years for a flexible sigmoidoscopy or every 10 years for a colonoscopy beginning at age 50 and continuing until age 75.  Hepatitis C blood test.** / For all people born from 1945 through 1965 and any individual with known risks for hepatitis C.  Osteoporosis screening.** / A one-time screening for women ages 65 and over and women at risk for fractures or osteoporosis.  Skin self-exam. / Monthly.  Influenza immunization.** / Every year.  Pneumococcal polysaccharide immunization.** / 1 dose at age 65 (or older) if you have never been vaccinated.  Tetanus, diphtheria, pertussis (Tdap, Td) immunization. / A one-time dose of Tdap vaccine if you are over   65 and have contact with an infant, are a healthcare worker, or simply want to be protected from whooping cough. After that, you need a Td booster dose every 10 years.  Varicella immunization.** / Consult your caregiver.  Meningococcal immunization.** / Consult your caregiver.  Hepatitis A immunization.** / Consult your caregiver. 2 doses, 6 to 18 months apart.  Hepatitis B immunization.** / Check with your caregiver. 3 doses, usually over 6 months. ** Family history and personal history of risk and conditions may change your caregiver's recommendations. Document Released: 10/20/2001 Document Revised: 11/16/2011  Document Reviewed: 01/19/2011 [employer] Patient Information 2014 [employer].  

## 2013-03-22 NOTE — Assessment & Plan Note (Signed)
She is exercising and adhering to a 2000 cal per day diet

## 2013-03-22 NOTE — Assessment & Plan Note (Signed)
Her BP is well controlled 

## 2013-06-18 ENCOUNTER — Other Ambulatory Visit: Payer: Self-pay | Admitting: Internal Medicine

## 2013-07-08 ENCOUNTER — Other Ambulatory Visit: Payer: Self-pay | Admitting: Internal Medicine

## 2013-07-13 ENCOUNTER — Other Ambulatory Visit: Payer: Self-pay

## 2013-08-04 ENCOUNTER — Other Ambulatory Visit: Payer: Self-pay

## 2013-08-04 MED ORDER — LOSARTAN POTASSIUM 50 MG PO TABS: 50.0000 mg | ORAL_TABLET | Freq: Every day | ORAL | Status: DC

## 2013-08-18 ENCOUNTER — Ambulatory Visit (INDEPENDENT_AMBULATORY_CARE_PROVIDER_SITE_OTHER): Payer: BC Managed Care – PPO | Admitting: Pulmonary Disease

## 2013-08-18 ENCOUNTER — Encounter: Payer: Self-pay | Admitting: Pulmonary Disease

## 2013-08-18 VITALS — BP 132/72 | HR 68 | Temp 98.1°F | Ht 65.0 in | Wt 196.6 lb

## 2013-08-18 DIAGNOSIS — G4733 Obstructive sleep apnea (adult) (pediatric): Secondary | ICD-10-CM

## 2013-08-18 DIAGNOSIS — G2581 Restless legs syndrome: Secondary | ICD-10-CM

## 2013-08-18 NOTE — Assessment & Plan Note (Signed)
The patient is tolerating CPAP well from a pressure standpoint, and has seen improvement in her sleep and daytime alertness. However, she continues to have mask fit issues, and her home care company has not been able to help. I will refer her to the sleep Center for a formal mask fitting, and I've encouraged her to work aggressively on weight loss.

## 2013-08-18 NOTE — Progress Notes (Signed)
   Subjective:    Patient ID: Kayla Khan, female    DOB: 30-Jun-1951, 62 y.o.   MRN: [id number]  HPI Patient comes in today for followup of her obstructive sleep apnea.  She is wearing CPAP compliantly, and has no issues with pressure. She has seen significant improvement in her sleep and daytime alertness. She is having issues with significant mask leak, and her home care company has not been able to find an adequate mask for her. We also treated her for RLS with Requip, but she did not see significant improvement. 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 She thinks this has settled down quite a bit recently, and would like to hold off on any further treatment.   Review of Systems  Constitutional: Negative for fever and unexpected weight change.  HENT: Negative for congestion, dental problem, ear pain, nosebleeds, postnasal drip, rhinorrhea, sinus pressure, sneezing, sore throat and trouble swallowing.   Eyes: Negative for redness and itching.  Respiratory: Negative for cough, chest tightness, shortness of breath and wheezing.   Cardiovascular: Negative for palpitations and leg swelling.  Gastrointestinal: Negative for nausea and vomiting.  Genitourinary: Negative for dysuria.  Musculoskeletal: Negative for joint swelling.  Skin: Negative for rash.  Neurological: Negative for headaches.  Hematological: Does not bruise/bleed easily.  Psychiatric/Behavioral: Positive for sleep disturbance (some RLS disturbances). Negative for dysphoric mood. The patient is not nervous/anxious.        Objective:   Physical Exam Overweight female in no acute distress Nose without purulence or discharge noted No skin breakdown or pressure necrosis from the CPAP mask Neck without lymphadenopathy or thyromegaly Lower extremities with minimal edema, no cyanosis Alert and oriented, moves all 4 extremities appear       Assessment & Plan:

## 2013-08-18 NOTE — Patient Instructions (Signed)
Will get you an apptm at the sleep center for a mask fitting. Continue with cpap, and work on weight reduction Please call if you are having more issues with your restless legs. followup with me in one year if doing well.

## 2013-08-18 NOTE — Assessment & Plan Note (Signed)
The patient did not see a significant improvement in her like symptoms with Requip. I have offered to try her on different medications, but she feels this is not a significant problem for her currently. She will call if increased symptoms.

## 2013-08-28 ENCOUNTER — Ambulatory Visit (HOSPITAL_BASED_OUTPATIENT_CLINIC_OR_DEPARTMENT_OTHER): Payer: BC Managed Care – PPO | Attending: Pulmonary Disease | Admitting: Radiology

## 2013-08-28 DIAGNOSIS — G4733 Obstructive sleep apnea (adult) (pediatric): Secondary | ICD-10-CM

## 2013-08-28 DIAGNOSIS — Z9989 Dependence on other enabling machines and devices: Secondary | ICD-10-CM

## 2013-08-29 ENCOUNTER — Telehealth: Payer: Self-pay | Admitting: Pulmonary Disease

## 2013-08-29 NOTE — Telephone Encounter (Signed)
I spoke with Kayla Khan. He is faxing over pt report on her mask fit. He will fax this to triage fax #. FYI for [place]. Will sign off message

## 2013-10-04 ENCOUNTER — Telehealth: Payer: Self-pay | Admitting: Pulmonary Disease

## 2013-10-04 DIAGNOSIS — G4733 Obstructive sleep apnea (adult) (pediatric): Secondary | ICD-10-CM

## 2013-10-04 NOTE — Telephone Encounter (Signed)
Order will be placed. Pt is aware.

## 2013-10-09 LAB — HM MAMMOGRAPHY: HM Mammogram: ABNORMAL

## 2013-11-05 ENCOUNTER — Other Ambulatory Visit: Payer: Self-pay | Admitting: Internal Medicine

## 2013-11-13 ENCOUNTER — Ambulatory Visit (INDEPENDENT_AMBULATORY_CARE_PROVIDER_SITE_OTHER): Payer: BC Managed Care – PPO | Admitting: Internal Medicine

## 2013-11-13 ENCOUNTER — Encounter: Payer: Self-pay | Admitting: Internal Medicine

## 2013-11-13 ENCOUNTER — Other Ambulatory Visit (INDEPENDENT_AMBULATORY_CARE_PROVIDER_SITE_OTHER): Payer: BC Managed Care – PPO

## 2013-11-13 VITALS — BP 128/74 | HR 67 | Temp 97.8°F | Resp 16 | Wt 186.2 lb

## 2013-11-13 DIAGNOSIS — R197 Diarrhea, unspecified: Secondary | ICD-10-CM

## 2013-11-13 DIAGNOSIS — K219 Gastro-esophageal reflux disease without esophagitis: Secondary | ICD-10-CM

## 2013-11-13 LAB — CBC WITH DIFFERENTIAL/PLATELET
Basophils Absolute: 0.1 K/uL (ref 0.0–0.1)
Basophils Relative: 0.9 % (ref 0.0–3.0)
Eosinophils Absolute: 0.4 K/uL (ref 0.0–0.7)
Eosinophils Relative: 4.5 % (ref 0.0–5.0)
HCT: 41 % (ref 36.0–46.0)
Hemoglobin: 13.8 g/dL (ref 12.0–15.0)
Lymphocytes Relative: 35.4 % (ref 12.0–46.0)
Lymphs Abs: 2.8 K/uL (ref 0.7–4.0)
MCHC: 33.6 g/dL (ref 30.0–36.0)
MCV: 93.1 fl (ref 78.0–100.0)
Monocytes Absolute: 0.5 K/uL (ref 0.1–1.0)
Monocytes Relative: 6.6 % (ref 3.0–12.0)
Neutro Abs: 4.1 K/uL (ref 1.4–7.7)
Neutrophils Relative %: 52.6 % (ref 43.0–77.0)
Platelets: 259 K/uL (ref 150.0–400.0)
RBC: 4.4 Mil/uL (ref 3.87–5.11)
RDW: 13.1 % (ref 11.5–14.6)
WBC: 7.9 K/uL (ref 4.5–10.5)

## 2013-11-13 LAB — SEDIMENTATION RATE: SED RATE: 18 mm/h (ref 0–22)

## 2013-11-13 MED ORDER — RANITIDINE HCL 300 MG PO TABS: 300.0000 mg | ORAL_TABLET | Freq: Every day | ORAL | Status: DC

## 2013-11-13 NOTE — Patient Instructions (Signed)
Chronic Diarrhea °Diarrhea is frequent loose and watery bowel movements. It can cause you to feel weak and dehydrated. Dehydration can cause you to become tired and thirsty and to have a dry mouth, decreased urination, and dark yellow urine. Diarrhea is a sign of another problem, most often an infection that will not last long. In most cases, diarrhea lasts 2 3 days. Diarrhea that lasts longer than 4 weeks is called long-lasting (chronic) diarrhea. It is important to treat your diarrhea as directed by your health care provider to lessen or prevent future episodes of diarrhea.  °CAUSES  °There are many causes of chronic diarrhea. The following are some possible causes:  °· Gastrointestinal infections caused by viruses, bacteria, or parasites.   °· Food poisoning or food allergies.   °· Certain medicines, such as antibiotics, chemotherapy, and laxatives.   °· Artificial sweeteners and fructose.   °· Digestive disorders, such as celiac disease and inflammatory bowel diseases.   °· Irritable bowel syndrome. °· Some disorders of the pancreas. °· Disorders of the thyroid. °· Reduced blood flow to the intestines. °· Cancer. °Sometimes the cause of chronic diarrhea is unknown. °RISK FACTORS °· Having a severely weakened immune system, such as from HIV or AIDS.   °· Taking certain types of cancer-fighting drugs (such as with chemotherapy) or other medicines.   °· Having had a recent organ transplant.   °· Having a portion of the stomach or small bowel removed.   °· Traveling to countries where food and water supplies are often contaminated.   °SYMPTOMS  °In addition to frequent, loose stools, diarrhea may cause:  °· Cramping.   °· Abdominal pain.   °· Nausea.   °· Fever. °· Fatigue. °· Urgent need to use the bathroom. °· Loss of bowel control. °DIAGNOSIS  °Your health care provider must take a careful history and perform a physical exam. Tests given are based on your symptoms and history. Tests may include:  °· Blood or  stool tests. Three or more stool samples may be examined. Stool cultures may be used to test for bacteria or parasites.   °· X-rays.   °· A procedure in which a thin tube is inserted into the mouth or rectum (endoscopy). This allows the health care provider to look inside the intestine.   °TREATMENT  °· Treatment is aimed at correcting the cause of the diarrhea when possible. °· Diarrhea caused by an infection can often be treated with antibiotics.   °· Diarrhea not caused by an infection may require you to take long-term medicine or have surgery. Specific treatment should be discussed with your health care provider.   °· If the cause cannot be determined, treatment aims to relieve symptoms and prevent dehydration. Serious health problems can occur if you do not maintain proper fluid levels. Treatment may include:   °· Taking an oral rehydration solution (ORS) . °· Not drinking beverages that contain caffeine (such as tea, coffee, and soft drinks).   °· Not drinking alcohol.   °· Maintaining well-balanced nutrition to help you recover faster. °HOME CARE INSTRUCTIONS  °· Drink enough fluids to keep urine clear or pale yellow. Drink 1 cup (8 oz) of fluid for each diarrhea episode. Avoid fluids that contain simple sugars, fruit juices, whole milk products, and sodas. Hydrate with an ORS. You may purchase the ORS or prepare it at home by mixing the following ingredients together:   °·   tsp (1.7 3  mL) table salt.   °· ¾ tsp (3 ¾ mL) baking soda.   °·  tsp (1.7 mL) salt substitute containing potassium chloride.   °· 1 tbsp (20   mL) sugar.   °· 4.2 c (1 L) of water.   °· Certain foods and beverages may increase the speed at which food moves through the gastrointestinal (GI) tract. These foods and beverages should be avoided. They include:   °· Caffeinated and alcoholic beverages.   °· High-fiber foods, such as raw fruits and vegetables, nuts, seeds, and whole grain breads and cereals.   °· Foods and beverages sweetened  with sugar alcohols, such as xylitol, sorbitol, and mannitol.   °· Some foods may be well tolerated and may help thicken stool. These include: °· Starchy foods, such as rice, toast, pasta, low-sugar cereal, oatmeal, grits, baked potatoes, crackers, and bagels.   °· Bananas.   °· Applesauce.   °· Add probiotic-rich foods to help increase healthy bacteria in the GI tract. These include yogurt and fermented milk products.   °· Wash your hands well after each diarrhea episode.   °· Only take over-the-counter or prescription medicines as directed by your health care provider.   °· Take a warm bath to relieve any burning or pain from frequent diarrhea episodes. °SEEK MEDICAL CARE IF:  °· You are not urinating as often. °· Your urine is a dark color.   °· You become very tired or dizzy.   °· You have severe pain in the abdomen or rectum.   °· Your have blood or pus in your stools.   °· Your stools look black and tarry.   °SEEK IMMEDIATE MEDICAL CARE IF:  °· You are unable to keep fluids down.   °· You have persistent vomiting.   °· You have blood in your stool. °· Your stools are black and tarry.   °· You do not urinate in 6 8 hours, or there is only a small amount of very dark urine.   °· You have abdominal pain that increases or localizes.   °· You have weakness, dizziness, confusion, or lightheadedness.   °· You have a severe headache.   °· Your diarrhea gets worse or does not get better.   °· You have a fever or persistent symptoms for more than 2 3 days.   °· You have a fever and your symptoms suddenly get worse.   °MAKE SURE YOU:  °· Understand these instructions. °· Will watch your condition. °· Will get help right away if you are not doing well or get worse. °Document Released: 11/14/2003 Document Revised: 04/26/2013 Document Reviewed: 02/16/2013 °[employer]® Patient Information ©2014 [employer]. ° °

## 2013-11-13 NOTE — Progress Notes (Signed)
Subjective:    Patient ID: Kayla Khan, female    DOB: [birth year], 63 y.o.   MRN: [id number]  Diarrhea  This is a chronic problem. The current episode started more than 1 year ago. The problem occurs less than 2 times per day. The problem has been unchanged. The stool consistency is described as watery. Pertinent negatives include no abdominal pain, arthralgias, bloating, chills, coughing, fever, headaches, increased  flatus, myalgias, sweats, URI, vomiting or weight loss. The symptoms are aggravated by stress. She has tried anti-motility drug for the symptoms. The treatment provided moderate relief. There is no history of bowel resection, inflammatory bowel disease, irritable bowel syndrome, malabsorption, a recent abdominal surgery or short gut syndrome.      Review of Systems  Constitutional: Negative.  Negative for fever, chills, weight loss, diaphoresis, appetite change and fatigue.  HENT: Negative.   Eyes: Negative.   Respiratory: Negative.  Negative for cough, choking, chest tightness, shortness of breath, wheezing and stridor.   Cardiovascular: Negative.  Negative for chest pain, palpitations and leg swelling.  Gastrointestinal: Positive for diarrhea. Negative for nausea, vomiting, abdominal pain, constipation, anal bleeding, bloating and flatus.  Endocrine: Negative.   Genitourinary: Negative.   Musculoskeletal: Negative.  Negative for arthralgias, myalgias and neck stiffness.  Skin: Negative.   Allergic/Immunologic: Negative.   Neurological: Negative.  Negative for dizziness, tremors, speech difficulty, weakness, light-headedness, numbness and headaches.  Hematological: Negative.  Negative for adenopathy. Does not bruise/bleed easily.  Psychiatric/Behavioral: Negative.        Objective:   Physical Exam  Vitals reviewed. Constitutional: She is oriented to person, place, and time. She appears well-developed and well-nourished. No distress.  HENT:  Head: Normocephalic  and atraumatic.  Mouth/Throat: Oropharynx is clear and moist. No oropharyngeal exudate.  Eyes: Conjunctivae are normal. Right eye exhibits no discharge. Left eye exhibits no discharge. No scleral icterus.  Neck: Normal range of motion. Neck supple. No JVD present. No tracheal deviation present. No thyromegaly present.  Cardiovascular: Normal rate, regular rhythm, normal heart sounds and intact distal pulses.  Exam reveals no gallop and no friction rub.   No murmur heard. Pulmonary/Chest: Effort normal and breath sounds normal. No stridor. No respiratory distress. She has no wheezes. She has no rales. She exhibits no tenderness.  Abdominal: Soft. Bowel sounds are normal. She exhibits no distension and no mass. There is no tenderness. There is no rebound and no guarding.  Musculoskeletal: Normal range of motion. She exhibits no edema and no tenderness.  Lymphadenopathy:    She has no cervical adenopathy.  Neurological: She is oriented to person, place, and time.  Skin: Skin is warm and dry. No rash noted. She is not diaphoretic. No erythema. No pallor.  Psychiatric: She has a normal mood and affect. Her behavior is normal. Judgment and thought content normal.     Lab Results  Component Value Date   WBC 6.1 01/24/2013   HGB 13.7 01/24/2013   HCT 39.7 01/24/2013   PLT 240.0 01/24/2013   GLUCOSE 97 01/24/2013   CHOL 149 03/22/2013   TRIG 110.0 03/22/2013   HDL 43.00 03/22/2013   LDLCALC 84 03/22/2013   ALT 25 01/24/2013   AST 17 01/24/2013   NA 140 01/24/2013   K 4.3 01/24/2013   CL 105 01/24/2013   CREATININE 0.8 01/24/2013   BUN 20 01/24/2013   CO2 30 01/24/2013   TSH 2.00 01/24/2013   HGBA1C 5.8 01/24/2013       Assessment & Plan:

## 2013-11-14 LAB — RETICULIN ANTIBODIES, IGA W TITER: Reticulin Ab, IgA: NEGATIVE

## 2013-11-14 LAB — TISSUE TRANSGLUTAMINASE, IGA: Tissue Transglutaminase Ab, IgA: 5.2 U/mL (ref <20)

## 2013-11-14 LAB — GLIADIN ANTIBODIES, SERUM
GLIADIN IGG: 37.7 U/mL — AB (ref <20)
Gliadin IgA: 3.2 U/mL (ref <20)

## 2013-11-14 NOTE — Assessment & Plan Note (Signed)
I will change the PPI to an H2 blocker to see if that will help reduce the diarrhea

## 2013-11-14 NOTE — Assessment & Plan Note (Signed)
She will stop the PPI to see if that helps Today I will do labs to check for IBD and celiac disease She may have IBS but she tells me that the symptoms are not sever enough for her to want a treatment for that with an agent like levbid

## 2013-11-15 ENCOUNTER — Encounter: Payer: Self-pay | Admitting: Internal Medicine

## 2014-06-13 ENCOUNTER — Other Ambulatory Visit: Payer: Self-pay | Admitting: Internal Medicine

## 2014-07-23 ENCOUNTER — Other Ambulatory Visit: Payer: Self-pay | Admitting: *Deleted

## 2014-07-23 MED ORDER — PRAVASTATIN SODIUM 40 MG PO TABS: ORAL_TABLET | ORAL | Status: DC

## 2014-08-20 ENCOUNTER — Other Ambulatory Visit: Payer: Self-pay | Admitting: *Deleted

## 2014-08-20 ENCOUNTER — Ambulatory Visit (INDEPENDENT_AMBULATORY_CARE_PROVIDER_SITE_OTHER): Payer: BC Managed Care – PPO | Admitting: Pulmonary Disease

## 2014-08-20 ENCOUNTER — Encounter: Payer: Self-pay | Admitting: Pulmonary Disease

## 2014-08-20 VITALS — BP 124/76 | HR 71 | Temp 97.0°F | Ht 65.0 in | Wt 188.0 lb

## 2014-08-20 DIAGNOSIS — G4733 Obstructive sleep apnea (adult) (pediatric): Secondary | ICD-10-CM

## 2014-08-20 DIAGNOSIS — G2581 Restless legs syndrome: Secondary | ICD-10-CM

## 2014-08-20 MED ORDER — PRAMIPEXOLE DIHYDROCHLORIDE 0.5 MG PO TABS: 0.5000 mg | ORAL_TABLET | Freq: Every day | ORAL | Status: DC | PRN

## 2014-08-20 MED ORDER — LOSARTAN POTASSIUM 50 MG PO TABS: 50.0000 mg | ORAL_TABLET | Freq: Every day | ORAL | Status: DC

## 2014-08-20 NOTE — Assessment & Plan Note (Signed)
The patient feels that she is doing well overall with C Pap, and has been keeping up with her mask changes and supplies. He feels that it continues to help her sleep, and is satisfied with her daytime alertness. I have asked her to continue on her device, and to work aggressively on weight loss.

## 2014-08-20 NOTE — Patient Instructions (Signed)
Continue on cpap, keep up with mask changes and supplies. Work on weight loss Can try mirapex 0.5mg  no more than once a day on a full stomach as needed for "bad night".  Let me know if this helps, and can send in refills. followup with me again in one year.

## 2014-08-20 NOTE — Progress Notes (Signed)
   Subjective:    Patient ID: Kayla Khan, female    DOB: 08-Aug-1951, 63 y.o.   MRN: [id number]  HPI The patient comes in today for follow-up of her obstructive sleep apnea. She is wearing C Pap compliantly, and is having no issues with her mask fit or pressure. She feels that she sleeps well with the device, and has adequate daytime alertness. Her only complaint today is that of intermittent issues with limb movements that she continues to believe is secondary to the restless leg syndrome. However, she has spinal stenosis with neuropathy, and has not responded to Requip in the past. Of note, the patient's weight is down 8 pounds since last visit.   Review of Systems  Constitutional: Negative for fever and unexpected weight change.  HENT: Negative for congestion, dental problem, ear pain, nosebleeds, postnasal drip, rhinorrhea, sinus pressure, sneezing, sore throat and trouble swallowing.   Eyes: Negative for redness and itching.  Respiratory: Negative for cough, chest tightness, shortness of breath and wheezing.   Cardiovascular: Negative for palpitations and leg swelling.  Gastrointestinal: Negative for nausea and vomiting.  Genitourinary: Negative for dysuria.  Musculoskeletal: Negative for joint swelling.  Skin: Negative for rash.  Neurological: Negative for headaches.  Hematological: Does not bruise/bleed easily.  Psychiatric/Behavioral: Negative for dysphoric mood. The patient is not nervous/anxious.        Objective:   Physical Exam Overweight female in no acute distress Nose without purulence or discharge noted No skin breakdown or pressure necrosis from the C Pap mask Neck without lymphadenopathy or thyromegaly Lower extremities with mild edema, no cyanosis Alert and oriented, does not appear to be sleepy, moves all 4 extremities.       Assessment & Plan:

## 2014-08-20 NOTE — Assessment & Plan Note (Signed)
The patient continues to have limb movements periodically at night that is suggestive of the restless leg syndrome. However, she also has a history of spinal stenosis with neuropathy, and did not respond well to Requip in the past. I am willing to give her a trial of Mirapex on an as-needed basis to see if this helps.

## 2014-11-30 ENCOUNTER — Encounter: Payer: Self-pay | Admitting: Cardiology

## 2014-11-30 ENCOUNTER — Ambulatory Visit (INDEPENDENT_AMBULATORY_CARE_PROVIDER_SITE_OTHER): Payer: BC Managed Care – PPO | Admitting: Cardiology

## 2014-11-30 VITALS — BP 122/80 | HR 70 | Ht 65.0 in | Wt 193.0 lb

## 2014-11-30 DIAGNOSIS — I10 Essential (primary) hypertension: Secondary | ICD-10-CM | POA: Diagnosis not present

## 2014-11-30 DIAGNOSIS — R0789 Other chest pain: Secondary | ICD-10-CM

## 2014-11-30 NOTE — Patient Instructions (Signed)
**Note De-identified Lianni Kanaan Obfuscation** Your physician recommends that you continue on your current medications as directed. Please refer to the Current Medication list given to you today.  Your physician wants you to follow-up in: 2 years  You will receive a reminder letter in the mail two months in advance. If you don't receive a letter, please call our office to schedule the follow-up appointment.  

## 2014-11-30 NOTE — Progress Notes (Signed)
Cardiology Office Note   Date:  11/30/2014   ID:  Kayla Khan, DOB [birth year], MRN [id number]  PCP:  Scarlette Calico, MD  Cardiologist:  Dola Argyle, MD   Chief Complaint  Patient presents with  . Appointment    Follow-up chest discomfort      History of Present Illness: Kayla Khan is a 64 y.o. female who presents today for the follow-up of chest discomfort. I have followed her for many years. Coronary arteries were normal by catheterization in 2004. 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 Nuclear scan in December, 2008 was normal. In the past few years she started taking arginine. She feels this has helped her substantially. She is not having any significant pain.    Past Medical History  Diagnosis Date  . 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 Diverticulosis of colon   . HLD (hyperlipidemia)   . Allergic conjunctivitis   . Fatigue   . Angina pectoris   . Thyroid nodule     The patient had a thyroid nodule that was surgically removed in the past.  She has one half of her thyroid and does not take replacement of Taxol and he was benign  . Diverticulosis of colon   . Sleep apnea   . DJD (degenerative joint disease)   . Depression with anxiety   . Anemia, iron deficiency   . GERD (gastroesophageal reflux disease)   . Palpitations   . Arthritis   . Asthma   . HLA B27 positive   . Ejection fraction     70%, catheter 2004 / normal LV function nuclear, 2008 ( no echo data as of December 23, 2010)    Past Surgical History  Procedure Laterality Date  . Vesicovaginal fistula closure w/ tah    . Breast biopsy    . Tubal ligation    . Appendectomy    . Cesarean section      x2  . Ganglion cyst excision    . Thyroidectomy, partial      left    Patient Active Problem List   Diagnosis Date Noted  . Chest tightness or pressure   . Diarrhea 11/13/2013  .  Obesity (BMI 30.0-34.9) 03/22/2013  . RLS (restless legs syndrome) 04/18/2012  . Routine general medical examination at a health care facility 10/19/2011  . Thyroid nodule   . HYPERGLYCEMIA 07/05/2009  . Hyperlipidemia LDL goal < 130 12/11/2008  . ANEMIA, IRON DEFICIENCY 05/03/2008  . DEPRESSION/ANXIETY 05/03/2008  . Essential hypertension 05/03/2008  . ASTHMA 05/03/2008  . GERD 05/03/2008  . DEGENERATIVE JOINT DISEASE 05/03/2008  . OSA (obstructive sleep apnea) 05/03/2008      Current Outpatient Prescriptions  Medication Sig Dispense Refill  . albuterol (PROVENTIL HFA) 108 (90 BASE) MCG/ACT inhaler Inhale 2 puffs into the lungs every 6 (six) hours as needed. 8.5 g 2  . ALPRAZolam (XANAX) 0.25 MG tablet Take 0.25 mg by mouth 2 (two) times daily as needed.      . Arginine 1000 MG TABS Take 2 tablets by mouth 2 (two) times daily.     . Ascorbic Acid (VITAMIN C) 1000 MG tablet Take 1,000 mg by mouth daily.      Marland Kitchen aspirin 81 MG tablet Take 81 mg by mouth daily.      Marland Kitchen CALCIUM-MAGNESIUM-ZINC PO Take 1 tablet by mouth daily.      Marland Kitchen  Cholecalciferol (VITAMIN D) 2000 UNITS CAPS Take 1 capsule by mouth daily.      . clobetasol (OLUX) 0.05 % topical foam Apply topically 2 (two) times daily.    . Coenzyme Q10 (CO Q 10) 100 MG CAPS Take 1 tablet by mouth daily.      . fish oil-omega-3 fatty acids 1000 MG capsule Take 3 g by mouth daily.      Marland Kitchen losartan (COZAAR) 50 MG tablet Take 1 tablet (50 mg total) by mouth daily. 30 tablet 6  . Multiple Vitamin (MULTIVITAMIN) tablet Take 1 tablet by mouth daily.      . NON FORMULARY CITRULLINE 500 mg -- twice daily     . olopatadine (PATANOL) 0.1 % ophthalmic solution Place 1 drop into both eyes 2 (two) times daily.      Marland Kitchen PARoxetine (PAXIL-CR) 25 MG 24 hr tablet take 1 tablet by mouth once daily 90 tablet 3  . pramipexole (MIRAPEX) 0.5 MG tablet Take 1 tablet (0.5 mg total) by mouth daily as needed. 30 tablet 0  . pravastatin (PRAVACHOL) 40 MG tablet take 1  tablet by mouth once daily 90 tablet 0   No current facility-administered medications for this visit.    Allergies:   Amoxicillin-pot clavulanate; Latex; and Meperidine hcl    Social History:  The patient  reports that she has never smoked. She has never used smokeless tobacco. She reports that she does not drink alcohol or use illicit drugs.   Family History:  The patient's family history includes Aneurysm in an other family member; Arthritis in an other family member; Cancer in her brother, mother, and another family member; Diabetes in an other family member; Heart attack in her maternal grandfather; Heart disease in an other family member; Hypertension in her father and another family member; Stroke in her maternal grandmother and paternal grandfather.    ROS:  Please see the history of present illness.   Patient denies fever, chills, headache, sweats, rash, change in vision, change in hearing, chest pain, cough, nausea or vomiting, urinary symptoms. All other systems are reviewed and are negative.     PHYSICAL EXAM: VS:  BP 122/80 mmHg  Pulse 70  Ht '5\' 5"'  (1.651 m)  Wt 193 lb (87.544 kg)  BMI 32.12 kg/m2 , Patient is oriented to person time and place. Affect is normal. She is overweight. Head is atraumatic. Sclera and conjunctiva are normal. There is no jugular venous distention. Lungs are clear. Respiratory effort is not labored. Cardiac exam reveals S1 and S2. The abdomen is soft. There is no peripheral edema. There are no musculoskeletal deformities. There are no skin rashes.  EKG:   EKG is done today and reviewed by me. EKG is unchanged from the past. The EKG is abnormal with decreased anterior R-wave progression.   Recent Labs: No results found for requested labs within last 365 days.    Lipid Panel    Component Value Date/Time   CHOL 149 03/22/2013 1021   TRIG 110.0 03/22/2013 1021   HDL 43.00 03/22/2013 1021   CHOLHDL 3 03/22/2013 1021   VLDL 22.0 03/22/2013 1021    LDLCALC 84 03/22/2013 1021      Wt Readings from Last 3 Encounters:  11/30/14 193 lb (87.544 kg)  08/20/14 188 lb (85.276 kg)  11/13/13 186 lb 4 oz (84.482 kg)      Current medicines are reviewed  The patient understands her medications.     ASSESSMENT AND PLAN:

## 2014-11-30 NOTE — Assessment & Plan Note (Signed)
Blood pressure is stable. No change in therapy. 

## 2014-11-30 NOTE — Assessment & Plan Note (Signed)
Catheterization in 2004 revealed no significant coronary disease. It was felt at that time that she might have microvascular angina. She feels that arginine helps her. No further workup is recommended. Our team will plan to follow her again in 2 years.

## 2015-01-09 ENCOUNTER — Encounter: Payer: Self-pay | Admitting: Internal Medicine

## 2015-01-23 ENCOUNTER — Encounter: Payer: Self-pay | Admitting: Internal Medicine

## 2015-01-23 ENCOUNTER — Ambulatory Visit (INDEPENDENT_AMBULATORY_CARE_PROVIDER_SITE_OTHER): Payer: BC Managed Care – PPO | Admitting: Internal Medicine

## 2015-01-23 ENCOUNTER — Other Ambulatory Visit (INDEPENDENT_AMBULATORY_CARE_PROVIDER_SITE_OTHER): Payer: BC Managed Care – PPO

## 2015-01-23 VITALS — BP 116/72 | HR 77 | Temp 98.2°F | Resp 16 | Ht 65.0 in | Wt 199.0 lb

## 2015-01-23 DIAGNOSIS — E785 Hyperlipidemia, unspecified: Secondary | ICD-10-CM | POA: Diagnosis not present

## 2015-01-23 DIAGNOSIS — I10 Essential (primary) hypertension: Secondary | ICD-10-CM

## 2015-01-23 DIAGNOSIS — D509 Iron deficiency anemia, unspecified: Secondary | ICD-10-CM | POA: Diagnosis not present

## 2015-01-23 DIAGNOSIS — E041 Nontoxic single thyroid nodule: Secondary | ICD-10-CM

## 2015-01-23 DIAGNOSIS — H8110 Benign paroxysmal vertigo, unspecified ear: Secondary | ICD-10-CM | POA: Insufficient documentation

## 2015-01-23 DIAGNOSIS — J4520 Mild intermittent asthma, uncomplicated: Secondary | ICD-10-CM

## 2015-01-23 DIAGNOSIS — R739 Hyperglycemia, unspecified: Secondary | ICD-10-CM

## 2015-01-23 DIAGNOSIS — H8113 Benign paroxysmal vertigo, bilateral: Secondary | ICD-10-CM

## 2015-01-23 LAB — CBC WITH DIFFERENTIAL/PLATELET
BASOS PCT: 0.9 % (ref 0.0–3.0)
Basophils Absolute: 0 10*3/uL (ref 0.0–0.1)
EOS PCT: 2.3 % (ref 0.0–5.0)
Eosinophils Absolute: 0.1 10*3/uL (ref 0.0–0.7)
HCT: 40.7 % (ref 36.0–46.0)
Hemoglobin: 13.8 g/dL (ref 12.0–15.0)
LYMPHS PCT: 37.6 % (ref 12.0–46.0)
Lymphs Abs: 2 10*3/uL (ref 0.7–4.0)
MCHC: 33.8 g/dL (ref 30.0–36.0)
MCV: 91.6 fl (ref 78.0–100.0)
MONO ABS: 0.5 10*3/uL (ref 0.1–1.0)
Monocytes Relative: 9 % (ref 3.0–12.0)
Neutro Abs: 2.7 10*3/uL (ref 1.4–7.7)
Neutrophils Relative %: 50.2 % (ref 43.0–77.0)
Platelets: 279 10*3/uL (ref 150.0–400.0)
RBC: 4.44 Mil/uL (ref 3.87–5.11)
RDW: 13.3 % (ref 11.5–15.5)
WBC: 5.4 10*3/uL (ref 4.0–10.5)

## 2015-01-23 LAB — COMPREHENSIVE METABOLIC PANEL
ALK PHOS: 69 U/L (ref 39–117)
ALT: 21 U/L (ref 0–35)
AST: 17 U/L (ref 0–37)
Albumin: 4.2 g/dL (ref 3.5–5.2)
BILIRUBIN TOTAL: 0.4 mg/dL (ref 0.2–1.2)
BUN: 23 mg/dL (ref 6–23)
CO2: 30 mEq/L (ref 19–32)
CREATININE: 0.75 mg/dL (ref 0.40–1.20)
Calcium: 9.7 mg/dL (ref 8.4–10.5)
Chloride: 107 mEq/L (ref 96–112)
GFR: 82.09 mL/min (ref >60.00)
GLUCOSE: 83 mg/dL (ref 70–99)
Potassium: 4.3 mEq/L (ref 3.5–5.1)
SODIUM: 142 meq/L (ref 135–145)
TOTAL PROTEIN: 7.3 g/dL (ref 6.0–8.3)

## 2015-01-23 LAB — TSH: TSH: 1.68 u[IU]/mL (ref 0.35–4.50)

## 2015-01-23 LAB — LIPID PANEL
CHOLESTEROL: 159 mg/dL (ref 0–200)
HDL: 36.8 mg/dL — ABNORMAL LOW (ref >39.00)
LDL Cholesterol: 84 mg/dL (ref 0–99)
NONHDL: 122.2
Total CHOL/HDL Ratio: 4
Triglycerides: 192 mg/dL — ABNORMAL HIGH (ref 0.0–149.0)
VLDL: 38.4 mg/dL (ref 0.0–40.0)

## 2015-01-23 MED ORDER — PRAVASTATIN SODIUM 40 MG PO TABS: ORAL_TABLET | ORAL | Status: DC

## 2015-01-23 MED ORDER — MECLIZINE HCL 12.5 MG PO TABS: 12.5000 mg | ORAL_TABLET | Freq: Three times a day (TID) | ORAL | Status: DC | PRN

## 2015-01-23 MED ORDER — ALBUTEROL SULFATE 108 (90 BASE) MCG/ACT IN AEPB: 1.0000 | INHALATION_SPRAY | Freq: Four times a day (QID) | RESPIRATORY_TRACT | Status: DC | PRN

## 2015-01-23 NOTE — Patient Instructions (Signed)

## 2015-01-23 NOTE — Progress Notes (Signed)
Pre visit review using our clinic review tool, if applicable. No additional management support is needed unless otherwise documented below in the visit note. 

## 2015-01-24 NOTE — Progress Notes (Signed)
Subjective:  Patient ID: Kayla Khan, female    DOB: 1951/03/13  Age: 64 y.o. MRN: [id number]  CC: Dizziness   HPI Kayla Khan presents for a 3 week history of vertigo when laying in the bed with dizziness and tinnitus. This has happened to her before and is slowly resolving but she wants to treat it.  Outpatient Prescriptions Prior to Visit  Medication Sig Dispense Refill  . ALPRAZolam (XANAX) 0.25 MG tablet Take 0.25 mg by mouth 2 (two) times daily as needed.      . Arginine 1000 MG TABS Take 2 tablets by mouth 2 (two) times daily.     . Ascorbic Acid (VITAMIN C) 1000 MG tablet Take 1,000 mg by mouth daily.      Marland Kitchen aspirin 81 MG tablet Take 81 mg by mouth daily.      Marland Kitchen CALCIUM-MAGNESIUM-ZINC PO Take 1 tablet by mouth daily.      . Cholecalciferol (VITAMIN D) 2000 UNITS CAPS Take 1 capsule by mouth daily.      . clobetasol (OLUX) 0.05 % topical foam Apply topically 2 (two) times daily.    . Coenzyme Q10 (CO Q 10) 100 MG CAPS Take 1 tablet by mouth daily.      . fish oil-omega-3 fatty acids 1000 MG capsule Take 3 g by mouth daily.      Marland Kitchen losartan (COZAAR) 50 MG tablet Take 1 tablet (50 mg total) by mouth daily. 30 tablet 6  . Multiple Vitamin (MULTIVITAMIN) tablet Take 1 tablet by mouth daily.      . NON FORMULARY CITRULLINE 500 mg -- twice daily     . olopatadine (PATANOL) 0.1 % ophthalmic solution Place 1 drop into both eyes 2 (two) times daily.      Marland Kitchen PARoxetine (PAXIL-CR) 25 MG 24 hr tablet take 1 tablet by mouth once daily 90 tablet 3  . pramipexole (MIRAPEX) 0.5 MG tablet Take 1 tablet (0.5 mg total) by mouth daily as needed. 30 tablet 0  . albuterol (PROVENTIL HFA) 108 (90 BASE) MCG/ACT inhaler Inhale 2 puffs into the lungs every 6 (six) hours as needed. 8.5 g 2  . pravastatin (PRAVACHOL) 40 MG tablet take 1 tablet by mouth once daily 90 tablet 0   No facility-administered medications prior to visit.    ROS Review of Systems  Constitutional: Negative.  Negative for  fever, chills, diaphoresis, appetite change and fatigue.  HENT: Positive for tinnitus. Negative for congestion, facial swelling and trouble swallowing.   Eyes: Negative.   Respiratory: Negative.  Negative for cough, choking, chest tightness, shortness of breath and stridor.   Cardiovascular: Negative.  Negative for chest pain, palpitations and leg swelling.  Gastrointestinal: Negative.  Negative for nausea, vomiting, abdominal pain, diarrhea, constipation and blood in stool.  Endocrine: Negative.   Genitourinary: Negative.   Musculoskeletal: Negative.  Negative for myalgias, back pain and joint swelling.  Skin: Negative.   Allergic/Immunologic: Negative.   Neurological: Positive for dizziness. Negative for tremors, seizures, syncope, facial asymmetry, speech difficulty, weakness, light-headedness, numbness and headaches.  Hematological: Negative.  Negative for adenopathy. Does not bruise/bleed easily.  Psychiatric/Behavioral: Negative.     Objective:  BP 116/72 mmHg  Pulse 77  Temp(Src) 98.2 F (36.8 C) (Oral)  Resp 16  Ht 5\' 5"  (1.651 m)  Wt 199 lb (90.266 kg)  BMI 33.12 kg/m2  SpO2 94%  BP Readings from Last 3 Encounters:  01/23/15 116/72  11/30/14 122/80  08/20/14 124/76    Wt  Readings from Last 3 Encounters:  01/23/15 199 lb (90.266 kg)  11/30/14 193 lb (87.544 kg)  08/20/14 188 lb (85.276 kg)    Physical Exam  Constitutional: She is oriented to person, place, and time. She appears well-developed and well-nourished. No distress.  HENT:  Head: Normocephalic and atraumatic.  Mouth/Throat: Oropharynx is clear and moist. No oropharyngeal exudate.  Eyes: Conjunctivae and EOM are normal. Pupils are equal, round, and reactive to light. Right eye exhibits no discharge. Left eye exhibits no discharge. No scleral icterus.  Neck: Normal range of motion. Neck supple. No JVD present. No tracheal deviation present. No thyromegaly present.  Cardiovascular: Normal rate, regular  rhythm, normal heart sounds and intact distal pulses.  Exam reveals no gallop and no friction rub.   No murmur heard. Pulmonary/Chest: Effort normal and breath sounds normal. No stridor. No respiratory distress. She has no wheezes. She has no rales. She exhibits no tenderness.  Abdominal: Soft. Bowel sounds are normal. She exhibits no distension and no mass. There is no tenderness. There is no rebound and no guarding.  Musculoskeletal: Normal range of motion. She exhibits no edema or tenderness.  Lymphadenopathy:    She has no cervical adenopathy.  Neurological: She is oriented to person, place, and time.  Skin: Skin is warm and dry. No rash noted. She is not diaphoretic. No erythema. No pallor.  Psychiatric: She has a normal mood and affect. Her behavior is normal. Judgment and thought content normal.  Vitals reviewed.   Lab Results  Component Value Date   WBC 5.4 01/23/2015   HGB 13.8 01/23/2015   HCT 40.7 01/23/2015   PLT 279.0 01/23/2015   GLUCOSE 83 01/23/2015   CHOL 159 01/23/2015   TRIG 192.0* 01/23/2015   HDL 36.80* 01/23/2015   LDLCALC 84 01/23/2015   ALT 21 01/23/2015   AST 17 01/23/2015   NA 142 01/23/2015   K 4.3 01/23/2015   CL 107 01/23/2015   CREATININE 0.75 01/23/2015   BUN 23 01/23/2015   CO2 30 01/23/2015   TSH 1.68 01/23/2015   HGBA1C 5.8 01/24/2013    Converted [facility]  10/09/2010   Document Description: Imaging Report Order Summary: [facility]  [facility]   Imported By: Bubba Hales 09/25/2010 10:56:26  _____________________________________________________________________  External Attachment:    Type:   Image     Comment:   External Document    Assessment & Plan:   Alanee was seen today for dizziness.  Diagnoses and all orders for this visit:  Essential hypertension - her BP is well controlled, lytes and renal function are stable Orders: -     Comprehensive metabolic panel; Future  Asthma, mild intermittent,  uncomplicated Orders: -     Albuterol Sulfate (PROAIR RESPICLICK) 371 (90 BASE) MCG/ACT AEPB; Inhale 1 puff into the lungs 4 (four) times daily as needed.  Hyperlipidemia with target LDL less than 130 Orders: -     pravastatin (PRAVACHOL) 40 MG tablet; take 1 tablet by mouth once daily -     Lipid panel; Future -     TSH; Future  Hyperglycemia Orders: -     Comprehensive metabolic panel; Future  Iron deficiency anemia Orders: -     CBC with Differential/Platelet; Future  Thyroid nodule- no nodule felt today, will follow her TSh Orders: -     TSH; Future  BPPV (benign paroxysmal positional vertigo), bilateral  Orders: -     meclizine (ANTIVERT) 12.5 MG tablet; Take 1 tablet (12.5  mg total) by mouth 3 (three) times daily as needed for dizziness. -     PT Vestibular Evaluation; Future   I have discontinued Ms. Walen's albuterol. I am also having her start on Albuterol Sulfate and meclizine. Additionally, I am having her maintain her ALPRAZolam, aspirin, CALCIUM-MAGNESIUM-ZINC PO, Co Q 10, Vitamin D, fish oil-omega-3 fatty acids, NON FORMULARY, Arginine, multivitamin, olopatadine, vitamin C, clobetasol, PARoxetine, losartan, pramipexole, and pravastatin.  Meds ordered this encounter  Medications  . pravastatin (PRAVACHOL) 40 MG tablet    Sig: take 1 tablet by mouth once daily    Dispense:  90 tablet    Refill:  3  . Albuterol Sulfate (PROAIR RESPICLICK) 694 (90 BASE) MCG/ACT AEPB    Sig: Inhale 1 puff into the lungs 4 (four) times daily as needed.    Dispense:  1 each    Refill:  11  . meclizine (ANTIVERT) 12.5 MG tablet    Sig: Take 1 tablet (12.5 mg total) by mouth 3 (three) times daily as needed for dizziness.    Dispense:  65 tablet    Refill:  1     Follow-up: Return in about 4 weeks (around 02/20/2015).  Scarlette Calico, MD

## 2015-02-11 ENCOUNTER — Telehealth: Payer: Self-pay | Admitting: Internal Medicine

## 2015-02-11 DIAGNOSIS — H8110 Benign paroxysmal vertigo, unspecified ear: Secondary | ICD-10-CM

## 2015-02-11 NOTE — Telephone Encounter (Signed)
A referral was ordered on 01/23/15 I ordered the referral again today

## 2015-02-11 NOTE — Telephone Encounter (Signed)
Dr. Ronnald Ramp was to send her to see someone about her dizziness and balance and she hasn't heard from anyone yet. I did not see a referral for it. Please call patient

## 2015-02-12 NOTE — Telephone Encounter (Signed)
Dr. Ronnald Ramp, I do not see any current referrals for this patient.

## 2015-02-12 NOTE — Telephone Encounter (Signed)
Look under "other orders"

## 2015-02-20 ENCOUNTER — Ambulatory Visit (INDEPENDENT_AMBULATORY_CARE_PROVIDER_SITE_OTHER): Payer: BC Managed Care – PPO | Admitting: Internal Medicine

## 2015-02-20 ENCOUNTER — Encounter: Payer: Self-pay | Admitting: Internal Medicine

## 2015-02-20 VITALS — BP 138/82 | HR 66 | Temp 98.4°F | Resp 16 | Ht 65.0 in | Wt 198.0 lb

## 2015-02-20 DIAGNOSIS — Z0000 Encounter for general adult medical examination without abnormal findings: Secondary | ICD-10-CM

## 2015-02-20 DIAGNOSIS — M899 Disorder of bone, unspecified: Secondary | ICD-10-CM

## 2015-02-20 DIAGNOSIS — H9313 Tinnitus, bilateral: Secondary | ICD-10-CM

## 2015-02-20 DIAGNOSIS — M8580 Other specified disorders of bone density and structure, unspecified site: Secondary | ICD-10-CM | POA: Insufficient documentation

## 2015-02-20 MED ORDER — ALPRAZOLAM 0.25 MG PO TABS: 0.2500 mg | ORAL_TABLET | Freq: Two times a day (BID) | ORAL | Status: DC | PRN

## 2015-02-20 NOTE — Telephone Encounter (Signed)
When orders are put under the "other orders" tab, they do not get sent to [place] or show up in any of our work queues. Order faxed to [facility].

## 2015-02-20 NOTE — Patient Instructions (Signed)
Preventive Care for Adults A healthy lifestyle and preventive care can promote health and wellness. Preventive health guidelines for women include the following key practices.  A routine yearly physical is a good way to check with your health care provider about your health and preventive screening. It is a chance to share any concerns and updates on your health and to receive a thorough exam.  Visit your dentist for a routine exam and preventive care every 6 months. Brush your teeth twice a day and floss once a day. Good oral hygiene prevents tooth decay and gum disease.  The frequency of eye exams is based on your age, health, family medical history, use of contact lenses, and other factors. Follow your health care provider's recommendations for frequency of eye exams.  Eat a healthy diet. Foods like vegetables, fruits, whole grains, low-fat dairy products, and lean protein foods contain the nutrients you need without too many calories. Decrease your intake of foods high in solid fats, added sugars, and salt. Eat the right amount of calories for you.Get information about a proper diet from your health care provider, if necessary.  Regular physical exercise is one of the most important things you can do for your health. Most adults should get at least 150 minutes of moderate-intensity exercise (any activity that increases your heart rate and causes you to sweat) each week. In addition, most adults need muscle-strengthening exercises on 2 or more days a week.  Maintain a healthy weight. The body mass index (BMI) is a screening tool to identify possible weight problems. It provides an estimate of body fat based on height and weight. Your health care provider can find your BMI and can help you achieve or maintain a healthy weight.For adults 20 years and older:  A BMI below 18.5 is considered underweight.  A BMI of 18.5 to 24.9 is normal.  A BMI of 25 to 29.9 is considered overweight.  A BMI of  30 and above is considered obese.  Maintain normal blood lipids and cholesterol levels by exercising and minimizing your intake of saturated fat. Eat a balanced diet with plenty of fruit and vegetables. Blood tests for lipids and cholesterol should begin at age 76 and be repeated every 5 years. If your lipid or cholesterol levels are high, you are over 50, or you are at high risk for heart disease, you may need your cholesterol levels checked more frequently.Ongoing high lipid and cholesterol levels should be treated with medicines if diet and exercise are not working.  If you smoke, find out from your health care provider how to quit. If you do not use tobacco, do not start.  Lung cancer screening is recommended for adults aged 22-80 years who are at high risk for developing lung cancer because of a history of smoking. A yearly low-dose CT scan of the lungs is recommended for people who have at least a 30-pack-year history of smoking and are a current smoker or have quit within the past 15 years. A pack year of smoking is smoking an average of 1 pack of cigarettes a day for 1 year (for example: 1 pack a day for 30 years or 2 packs a day for 15 years). Yearly screening should continue until the smoker has stopped smoking for at least 15 years. Yearly screening should be stopped for people who develop a health problem that would prevent them from having lung cancer treatment.  If you are pregnant, do not drink alcohol. If you are breastfeeding,  be very cautious about drinking alcohol. If you are not pregnant and choose to drink alcohol, do not have more than 1 drink per day. One drink is considered to be 12 ounces (355 mL) of beer, 5 ounces (148 mL) of wine, or 1.5 ounces (44 mL) of liquor.  Avoid use of street drugs. Do not share needles with anyone. Ask for help if you need support or instructions about stopping the use of drugs.  High blood pressure causes heart disease and increases the risk of  stroke. Your blood pressure should be checked at least every 1 to 2 years. Ongoing high blood pressure should be treated with medicines if weight loss and exercise do not work.  If you are 3-86 years old, ask your health care provider if you should take aspirin to prevent strokes.  Diabetes screening involves taking a blood sample to check your fasting blood sugar level. This should be done once every 3 years, after age 67, if you are within normal weight and without risk factors for diabetes. Testing should be considered at a younger age or be carried out more frequently if you are overweight and have at least 1 risk factor for diabetes.  Breast cancer screening is essential preventive care for women. You should practice "breast self-awareness." This means understanding the normal appearance and feel of your breasts and may include breast self-examination. Any changes detected, no matter how small, should be reported to a health care provider. Women in their 8s and 30s should have a clinical breast exam (CBE) by a health care provider as part of a regular health exam every 1 to 3 years. After age 70, women should have a CBE every year. Starting at age 25, women should consider having a mammogram (breast X-ray test) every year. Women who have a family history of breast cancer should talk to their health care provider about genetic screening. Women at a high risk of breast cancer should talk to their health care providers about having an MRI and a mammogram every year.  Breast cancer gene (BRCA)-related cancer risk assessment is recommended for women who have family members with BRCA-related cancers. BRCA-related cancers include breast, ovarian, tubal, and peritoneal cancers. Having family members with these cancers may be associated with an increased risk for harmful changes (mutations) in the breast cancer genes BRCA1 and BRCA2. Results of the assessment will determine the need for genetic counseling and  BRCA1 and BRCA2 testing.  Routine pelvic exams to screen for cancer are no longer recommended for nonpregnant women who are considered low risk for cancer of the pelvic organs (ovaries, uterus, and vagina) and who do not have symptoms. Ask your health care provider if a screening pelvic exam is right for you.  If you have had past treatment for cervical cancer or a condition that could lead to cancer, you need Pap tests and screening for cancer for at least 20 years after your treatment. If Pap tests have been discontinued, your risk factors (such as having a new sexual partner) need to be reassessed to determine if screening should be resumed. Some women have medical problems that increase the chance of getting cervical cancer. In these cases, your health care provider may recommend more frequent screening and Pap tests.  The HPV test is an additional test that may be used for cervical cancer screening. The HPV test looks for the virus that can cause the cell changes on the cervix. The cells collected during the Pap test can be  tested for HPV. The HPV test could be used to screen women aged 30 years and older, and should be used in women of any age who have unclear Pap test results. After the age of 30, women should have HPV testing at the same frequency as a Pap test.  Colorectal cancer can be detected and often prevented. Most routine colorectal cancer screening begins at the age of 50 years and continues through age 75 years. However, your health care provider may recommend screening at an earlier age if you have risk factors for colon cancer. On a yearly basis, your health care provider may provide home test kits to check for hidden blood in the stool. Use of a small camera at the end of a tube, to directly examine the colon (sigmoidoscopy or colonoscopy), can detect the earliest forms of colorectal cancer. Talk to your health care provider about this at age 50, when routine screening begins. Direct  exam of the colon should be repeated every 5-10 years through age 75 years, unless early forms of pre-cancerous polyps or small growths are found.  People who are at an increased risk for hepatitis B should be screened for this virus. You are considered at high risk for hepatitis B if:  You were born in a country where hepatitis B occurs often. Talk with your health care provider about which countries are considered high risk.  Your parents were born in a high-risk country and you have not received a shot to protect against hepatitis B (hepatitis B vaccine).  You have HIV or AIDS.  You use needles to inject street drugs.  You live with, or have sex with, someone who has hepatitis B.  You get hemodialysis treatment.  You take certain medicines for conditions like cancer, organ transplantation, and autoimmune conditions.  Hepatitis C blood testing is recommended for all people born from 1945 through 1965 and any individual with known risks for hepatitis C.  Practice safe sex. Use condoms and avoid high-risk sexual practices to reduce the spread of sexually transmitted infections (STIs). STIs include gonorrhea, chlamydia, syphilis, trichomonas, herpes, HPV, and human immunodeficiency virus (HIV). Herpes, HIV, and HPV are viral illnesses that have no cure. They can result in disability, cancer, and death.  You should be screened for sexually transmitted illnesses (STIs) including gonorrhea and chlamydia if:  You are sexually active and are younger than 24 years.  You are older than 24 years and your health care provider tells you that you are at risk for this type of infection.  Your sexual activity has changed since you were last screened and you are at an increased risk for chlamydia or gonorrhea. Ask your health care provider if you are at risk.  If you are at risk of being infected with HIV, it is recommended that you take a prescription medicine daily to prevent HIV infection. This is  called preexposure prophylaxis (PrEP). You are considered at risk if:  You are a heterosexual woman, are sexually active, and are at increased risk for HIV infection.  You take drugs by injection.  You are sexually active with a partner who has HIV.  Talk with your health care provider about whether you are at high risk of being infected with HIV. If you choose to begin PrEP, you should first be tested for HIV. You should then be tested every 3 months for as long as you are taking PrEP.  Osteoporosis is a disease in which the bones lose minerals and strength   with aging. This can result in serious bone fractures or breaks. The risk of osteoporosis can be identified using a bone density scan. Women ages 65 years and over and women at risk for fractures or osteoporosis should discuss screening with their health care providers. Ask your health care provider whether you should take a calcium supplement or vitamin D to reduce the rate of osteoporosis.  Menopause can be associated with physical symptoms and risks. Hormone replacement therapy is available to decrease symptoms and risks. You should talk to your health care provider about whether hormone replacement therapy is right for you.  Use sunscreen. Apply sunscreen liberally and repeatedly throughout the day. You should seek shade when your shadow is shorter than you. Protect yourself by wearing long sleeves, pants, a wide-brimmed hat, and sunglasses year round, whenever you are outdoors.  Once a month, do a whole body skin exam, using a mirror to look at the skin on your back. Tell your health care provider of new moles, moles that have irregular borders, moles that are larger than a pencil eraser, or moles that have changed in shape or color.  Stay current with required vaccines (immunizations).  Influenza vaccine. All adults should be immunized every year.  Tetanus, diphtheria, and acellular pertussis (Td, Tdap) vaccine. Pregnant women should  receive 1 dose of Tdap vaccine during each pregnancy. The dose should be obtained regardless of the length of time since the last dose. Immunization is preferred during the 27th-36th week of gestation. An adult who has not previously received Tdap or who does not know her vaccine status should receive 1 dose of Tdap. This initial dose should be followed by tetanus and diphtheria toxoids (Td) booster doses every 10 years. Adults with an unknown or incomplete history of completing a 3-dose immunization series with Td-containing vaccines should begin or complete a primary immunization series including a Tdap dose. Adults should receive a Td booster every 10 years.  Varicella vaccine. An adult without evidence of immunity to varicella should receive 2 doses or a second dose if she has previously received 1 dose. Pregnant females who do not have evidence of immunity should receive the first dose after pregnancy. This first dose should be obtained before leaving the health care facility. The second dose should be obtained 4-8 weeks after the first dose.  Human papillomavirus (HPV) vaccine. Females aged 13-26 years who have not received the vaccine previously should obtain the 3-dose series. The vaccine is not recommended for use in pregnant females. However, pregnancy testing is not needed before receiving a dose. If a female is found to be pregnant after receiving a dose, no treatment is needed. In that case, the remaining doses should be delayed until after the pregnancy. Immunization is recommended for any person with an immunocompromised condition through the age of 26 years if she did not get any or all doses earlier. During the 3-dose series, the second dose should be obtained 4-8 weeks after the first dose. The third dose should be obtained 24 weeks after the first dose and 16 weeks after the second dose.  Zoster vaccine. One dose is recommended for adults aged 60 years or older unless certain conditions are  present.  Measles, mumps, and rubella (MMR) vaccine. Adults born before 1957 generally are considered immune to measles and mumps. Adults born in 1957 or later should have 1 or more doses of MMR vaccine unless there is a contraindication to the vaccine or there is laboratory evidence of immunity to   each of the three diseases. A routine second dose of MMR vaccine should be obtained at least 28 days after the first dose for students attending postsecondary schools, health care workers, or international travelers. People who received inactivated measles vaccine or an unknown type of measles vaccine during 1963-1967 should receive 2 doses of MMR vaccine. People who received inactivated mumps vaccine or an unknown type of mumps vaccine before 1979 and are at high risk for mumps infection should consider immunization with 2 doses of MMR vaccine. For females of childbearing age, rubella immunity should be determined. If there is no evidence of immunity, females who are not pregnant should be vaccinated. If there is no evidence of immunity, females who are pregnant should delay immunization until after pregnancy. Unvaccinated health care workers born before 1957 who lack laboratory evidence of measles, mumps, or rubella immunity or laboratory confirmation of disease should consider measles and mumps immunization with 2 doses of MMR vaccine or rubella immunization with 1 dose of MMR vaccine.  Pneumococcal 13-valent conjugate (PCV13) vaccine. When indicated, a person who is uncertain of her immunization history and has no record of immunization should receive the PCV13 vaccine. An adult aged 19 years or older who has certain medical conditions and has not been previously immunized should receive 1 dose of PCV13 vaccine. This PCV13 should be followed with a dose of pneumococcal polysaccharide (PPSV23) vaccine. The PPSV23 vaccine dose should be obtained at least 8 weeks after the dose of PCV13 vaccine. An adult aged 19  years or older who has certain medical conditions and previously received 1 or more doses of PPSV23 vaccine should receive 1 dose of PCV13. The PCV13 vaccine dose should be obtained 1 or more years after the last PPSV23 vaccine dose.  Pneumococcal polysaccharide (PPSV23) vaccine. When PCV13 is also indicated, PCV13 should be obtained first. All adults aged 65 years and older should be immunized. An adult younger than age 65 years who has certain medical conditions should be immunized. Any person who resides in a nursing home or long-term care facility should be immunized. An adult smoker should be immunized. People with an immunocompromised condition and certain other conditions should receive both PCV13 and PPSV23 vaccines. People with human immunodeficiency virus (HIV) infection should be immunized as soon as possible after diagnosis. Immunization during chemotherapy or radiation therapy should be avoided. Routine use of PPSV23 vaccine is not recommended for American Indians, [place] Natives, or people younger than 65 years unless there are medical conditions that require PPSV23 vaccine. When indicated, people who have unknown immunization and have no record of immunization should receive PPSV23 vaccine. One-time revaccination 5 years after the first dose of PPSV23 is recommended for people aged 19-64 years who have chronic kidney failure, nephrotic syndrome, asplenia, or immunocompromised conditions. People who received 1-2 doses of PPSV23 before age 65 years should receive another dose of PPSV23 vaccine at age 65 years or later if at least 5 years have passed since the previous dose. Doses of PPSV23 are not needed for people immunized with PPSV23 at or after age 65 years.  Meningococcal vaccine. Adults with asplenia or persistent complement component deficiencies should receive 2 doses of quadrivalent meningococcal conjugate (MenACWY-D) vaccine. The doses should be obtained at least 2 months apart.  Microbiologists working with certain meningococcal bacteria, military recruits, people at risk during an outbreak, and people who travel to or live in countries with a high rate of meningitis should be immunized. A first-year college student up through age   21 years who is living in a residence hall should receive a dose if she did not receive a dose on or after her 16th birthday. Adults who have certain high-risk conditions should receive one or more doses of vaccine.  Hepatitis A vaccine. Adults who wish to be protected from this disease, have certain high-risk conditions, work with hepatitis A-infected animals, work in hepatitis A research labs, or travel to or work in countries with a high rate of hepatitis A should be immunized. Adults who were previously unvaccinated and who anticipate close contact with an international adoptee during the first 60 days after arrival in the [place] from a country with a high rate of hepatitis A should be immunized.  Hepatitis B vaccine. Adults who wish to be protected from this disease, have certain high-risk conditions, may be exposed to blood or other infectious body fluids, are household contacts or sex partners of hepatitis B positive people, are clients or workers in certain care facilities, or travel to or work in countries with a high rate of hepatitis B should be immunized.  Haemophilus influenzae type b (Hib) vaccine. A previously unvaccinated person with asplenia or sickle cell disease or having a scheduled splenectomy should receive 1 dose of Hib vaccine. Regardless of previous immunization, a recipient of a hematopoietic stem cell transplant should receive a 3-dose series 6-12 months after her successful transplant. Hib vaccine is not recommended for adults with HIV infection. Preventive Services / Frequency Ages 64 to 68 years  Blood pressure check.** / Every 1 to 2 years.  Lipid and cholesterol check.** / Every 5 years beginning at age  22.  Clinical breast exam.** / Every 3 years for women in their 88s and 53s.  BRCA-related cancer risk assessment.** / For women who have family members with a BRCA-related cancer (breast, ovarian, tubal, or peritoneal cancers).  Pap test.** / Every 2 years from ages 90 through 51. Every 3 years starting at age 21 through age 56 or 3 with a history of 3 consecutive normal Pap tests.  HPV screening.** / Every 3 years from ages 24 through ages 1 to 46 with a history of 3 consecutive normal Pap tests.  Hepatitis C blood test.** / For any individual with known risks for hepatitis C.  Skin self-exam. / Monthly.  Influenza vaccine. / Every year.  Tetanus, diphtheria, and acellular pertussis (Tdap, Td) vaccine.** / Consult your health care provider. Pregnant women should receive 1 dose of Tdap vaccine during each pregnancy. 1 dose of Td every 10 years.  Varicella vaccine.** / Consult your health care provider. Pregnant females who do not have evidence of immunity should receive the first dose after pregnancy.  HPV vaccine. / 3 doses over 6 months, if 72 and younger. The vaccine is not recommended for use in pregnant females. However, pregnancy testing is not needed before receiving a dose.  Measles, mumps, rubella (MMR) vaccine.** / You need at least 1 dose of MMR if you were born in 1957 or later. You may also need a 2nd dose. For females of childbearing age, rubella immunity should be determined. If there is no evidence of immunity, females who are not pregnant should be vaccinated. If there is no evidence of immunity, females who are pregnant should delay immunization until after pregnancy.  Pneumococcal 13-valent conjugate (PCV13) vaccine.** / Consult your health care provider.  Pneumococcal polysaccharide (PPSV23) vaccine.** / 1 to 2 doses if you smoke cigarettes or if you have certain conditions.  Meningococcal vaccine.** /  1 dose if you are age 19 to 21 years and a first-year college  student living in a residence hall, or have one of several medical conditions, you need to get vaccinated against meningococcal disease. You may also need additional booster doses.  Hepatitis A vaccine.** / Consult your health care provider.  Hepatitis B vaccine.** / Consult your health care provider.  Haemophilus influenzae type b (Hib) vaccine.** / Consult your health care provider. Ages 40 to 64 years  Blood pressure check.** / Every 1 to 2 years.  Lipid and cholesterol check.** / Every 5 years beginning at age 20 years.  Lung cancer screening. / Every year if you are aged 55-80 years and have a 30-pack-year history of smoking and currently smoke or have quit within the past 15 years. Yearly screening is stopped once you have quit smoking for at least 15 years or develop a health problem that would prevent you from having lung cancer treatment.  Clinical breast exam.** / Every year after age 40 years.  BRCA-related cancer risk assessment.** / For women who have family members with a BRCA-related cancer (breast, ovarian, tubal, or peritoneal cancers).  Mammogram.** / Every year beginning at age 40 years and continuing for as long as you are in good health. Consult with your health care provider.  Pap test.** / Every 3 years starting at age 30 years through age 65 or 70 years with a history of 3 consecutive normal Pap tests.  HPV screening.** / Every 3 years from ages 30 years through ages 65 to 70 years with a history of 3 consecutive normal Pap tests.  Fecal occult blood test (FOBT) of stool. / Every year beginning at age 50 years and continuing until age 75 years. You may not need to do this test if you get a colonoscopy every 10 years.  Flexible sigmoidoscopy or colonoscopy.** / Every 5 years for a flexible sigmoidoscopy or every 10 years for a colonoscopy beginning at age 50 years and continuing until age 75 years.  Hepatitis C blood test.** / For all people born from 1945 through  1965 and any individual with known risks for hepatitis C.  Skin self-exam. / Monthly.  Influenza vaccine. / Every year.  Tetanus, diphtheria, and acellular pertussis (Tdap/Td) vaccine.** / Consult your health care provider. Pregnant women should receive 1 dose of Tdap vaccine during each pregnancy. 1 dose of Td every 10 years.  Varicella vaccine.** / Consult your health care provider. Pregnant females who do not have evidence of immunity should receive the first dose after pregnancy.  Zoster vaccine.** / 1 dose for adults aged 60 years or older.  Measles, mumps, rubella (MMR) vaccine.** / You need at least 1 dose of MMR if you were born in 1957 or later. You may also need a 2nd dose. For females of childbearing age, rubella immunity should be determined. If there is no evidence of immunity, females who are not pregnant should be vaccinated. If there is no evidence of immunity, females who are pregnant should delay immunization until after pregnancy.  Pneumococcal 13-valent conjugate (PCV13) vaccine.** / Consult your health care provider.  Pneumococcal polysaccharide (PPSV23) vaccine.** / 1 to 2 doses if you smoke cigarettes or if you have certain conditions.  Meningococcal vaccine.** / Consult your health care provider.  Hepatitis A vaccine.** / Consult your health care provider.  Hepatitis B vaccine.** / Consult your health care provider.  Haemophilus influenzae type b (Hib) vaccine.** / Consult your health care provider. Ages 65   years and over  Blood pressure check.** / Every 1 to 2 years.  Lipid and cholesterol check.** / Every 5 years beginning at age 22 years.  Lung cancer screening. / Every year if you are aged 73-80 years and have a 30-pack-year history of smoking and currently smoke or have quit within the past 15 years. Yearly screening is stopped once you have quit smoking for at least 15 years or develop a health problem that would prevent you from having lung cancer  treatment.  Clinical breast exam.** / Every year after age 4 years.  BRCA-related cancer risk assessment.** / For women who have family members with a BRCA-related cancer (breast, ovarian, tubal, or peritoneal cancers).  Mammogram.** / Every year beginning at age 40 years and continuing for as long as you are in good health. Consult with your health care provider.  Pap test.** / Every 3 years starting at age 9 years through age 34 or 91 years with 3 consecutive normal Pap tests. Testing can be stopped between 65 and 70 years with 3 consecutive normal Pap tests and no abnormal Pap or HPV tests in the past 10 years.  HPV screening.** / Every 3 years from ages 57 years through ages 64 or 45 years with a history of 3 consecutive normal Pap tests. Testing can be stopped between 65 and 70 years with 3 consecutive normal Pap tests and no abnormal Pap or HPV tests in the past 10 years.  Fecal occult blood test (FOBT) of stool. / Every year beginning at age 15 years and continuing until age 17 years. You may not need to do this test if you get a colonoscopy every 10 years.  Flexible sigmoidoscopy or colonoscopy.** / Every 5 years for a flexible sigmoidoscopy or every 10 years for a colonoscopy beginning at age 86 years and continuing until age 71 years.  Hepatitis C blood test.** / For all people born from [birth year] through 1967 and any individual with known risks for hepatitis C.  Osteoporosis screening.** / A one-time screening for women ages 83 years and over and women at risk for fractures or osteoporosis.  Skin self-exam. / Monthly.  Influenza vaccine. / Every year.  Tetanus, diphtheria, and acellular pertussis (Tdap/Td) vaccine.** / 1 dose of Td every 10 years.  Varicella vaccine.** / Consult your health care provider.  Zoster vaccine.** / 1 dose for adults aged 61 years or older.  Pneumococcal 13-valent conjugate (PCV13) vaccine.** / Consult your health care provider.  Pneumococcal  polysaccharide (PPSV23) vaccine.** / 1 dose for all adults aged 28 years and older.  Meningococcal vaccine.** / Consult your health care provider.  Hepatitis A vaccine.** / Consult your health care provider.  Hepatitis B vaccine.** / Consult your health care provider.  Haemophilus influenzae type b (Hib) vaccine.** / Consult your health care provider. ** Family history and personal history of risk and conditions may change your health care provider's recommendations. Document Released: 10/20/2001 Document Revised: 01/08/2014 Document Reviewed: 01/19/2011 [facility] Patient Information 2015 [place], [place]. This information is not intended to replace advice given to you by your health care provider. Make sure you discuss any questions you have with your health care provider.

## 2015-02-20 NOTE — Progress Notes (Signed)
Pre visit review using our clinic review tool, if applicable. No additional management support is needed unless otherwise documented below in the visit note. 

## 2015-02-21 DIAGNOSIS — H9319 Tinnitus, unspecified ear: Secondary | ICD-10-CM | POA: Insufficient documentation

## 2015-02-21 NOTE — Assessment & Plan Note (Signed)

## 2015-02-21 NOTE — Progress Notes (Signed)
Subjective:  Patient ID: Kayla Khan, female    DOB: 1951-01-08  Age: 64 y.o. MRN: [id number]  CC: Annual Exam   HPI Kayla Khan presents for a CPX but she also complains of chronic tinnitus ('high-pitched noise") for many years, the dizziness and vertigo have resolved. She has decided not to do the vestibular therapy. She offers no other complaints.  Outpatient Prescriptions Prior to Visit  Medication Sig Dispense Refill  . Albuterol Sulfate (PROAIR RESPICLICK) 749 (90 BASE) MCG/ACT AEPB Inhale 1 puff into the lungs 4 (four) times daily as needed. 1 each 11  . Arginine 1000 MG TABS Take 2 tablets by mouth 2 (two) times daily.     . Ascorbic Acid (VITAMIN C) 1000 MG tablet Take 1,000 mg by mouth daily.      Marland Kitchen aspirin 81 MG tablet Take 81 mg by mouth daily.      Marland Kitchen CALCIUM-MAGNESIUM-ZINC PO Take 1 tablet by mouth daily.      . Cholecalciferol (VITAMIN D) 2000 UNITS CAPS Take 1 capsule by mouth daily.      . clobetasol (OLUX) 0.05 % topical foam Apply topically 2 (two) times daily.    . Coenzyme Q10 (CO Q 10) 100 MG CAPS Take 1 tablet by mouth daily.      . fish oil-omega-3 fatty acids 1000 MG capsule Take 3 g by mouth daily.      Marland Kitchen losartan (COZAAR) 50 MG tablet Take 1 tablet (50 mg total) by mouth daily. 30 tablet 6  . meclizine (ANTIVERT) 12.5 MG tablet Take 1 tablet (12.5 mg total) by mouth 3 (three) times daily as needed for dizziness. 65 tablet 1  . Multiple Vitamin (MULTIVITAMIN) tablet Take 1 tablet by mouth daily.      . NON FORMULARY CITRULLINE 500 mg -- twice daily     . olopatadine (PATANOL) 0.1 % ophthalmic solution Place 1 drop into both eyes 2 (two) times daily.      Marland Kitchen PARoxetine (PAXIL-CR) 25 MG 24 hr tablet take 1 tablet by mouth once daily 90 tablet 3  . pramipexole (MIRAPEX) 0.5 MG tablet Take 1 tablet (0.5 mg total) by mouth daily as needed. 30 tablet 0  . pravastatin (PRAVACHOL) 40 MG tablet take 1 tablet by mouth once daily 90 tablet 3  . ALPRAZolam (XANAX)  0.25 MG tablet Take 0.25 mg by mouth 2 (two) times daily as needed.       No facility-administered medications prior to visit.    ROS Review of Systems  Constitutional: Negative.  Negative for fever, chills, diaphoresis, activity change and fatigue.  HENT: Positive for tinnitus. Negative for ear discharge, ear pain, facial swelling, hearing loss, postnasal drip, rhinorrhea, sinus pressure, trouble swallowing and voice change.   Eyes: Negative.   Respiratory: Positive for apnea. Negative for cough, choking, chest tightness, shortness of breath, wheezing and stridor.   Cardiovascular: Negative.  Negative for chest pain, palpitations and leg swelling.  Gastrointestinal: Negative.  Negative for nausea, vomiting, abdominal pain, diarrhea, constipation and blood in stool.  Endocrine: Negative.   Genitourinary: Negative.   Musculoskeletal: Negative.  Negative for myalgias, back pain and joint swelling.  Skin: Negative.  Negative for rash.  Allergic/Immunologic: Negative.   Neurological: Negative.  Negative for dizziness, tremors, syncope, light-headedness, numbness and headaches.  Hematological: Negative.  Does not bruise/bleed easily.  Psychiatric/Behavioral: Positive for sleep disturbance. Negative for suicidal ideas, confusion, self-injury and decreased concentration. The patient is nervous/anxious.     Objective:  BP 138/82 mmHg  Pulse 66  Temp(Src) 98.4 F (36.9 C) (Oral)  Resp 16  Ht 5\' 5"  (1.651 m)  Wt 198 lb (89.812 kg)  BMI 32.95 kg/m2  SpO2 94%  BP Readings from Last 3 Encounters:  02/20/15 138/82  01/23/15 116/72  11/30/14 122/80    Wt Readings from Last 3 Encounters:  02/20/15 198 lb (89.812 kg)  01/23/15 199 lb (90.266 kg)  11/30/14 193 lb (87.544 kg)    Physical Exam  Constitutional: She is oriented to person, place, and time. She appears well-developed and well-nourished. No distress.  HENT:  Head: Normocephalic and atraumatic.  Right Ear: Hearing, tympanic  membrane, external ear and ear canal normal.  Left Ear: Hearing, tympanic membrane, external ear and ear canal normal.  Mouth/Throat: Oropharynx is clear and moist and mucous membranes are normal. Mucous membranes are not pale, not dry and not cyanotic. No oral lesions. No trismus in the jaw. No uvula swelling. No oropharyngeal exudate, posterior oropharyngeal edema, posterior oropharyngeal erythema or tonsillar abscesses.  Eyes: Conjunctivae are normal. Right eye exhibits no discharge. Left eye exhibits no discharge. No scleral icterus.  Neck: Normal range of motion. Neck supple. No JVD present. No tracheal deviation present. No thyromegaly present.  Cardiovascular: Normal rate, regular rhythm, normal heart sounds and intact distal pulses.  Exam reveals no gallop and no friction rub.   No murmur heard. Pulmonary/Chest: Effort normal and breath sounds normal. No stridor. No respiratory distress. She has no wheezes. She has no rales. She exhibits no tenderness.  Abdominal: Soft. Bowel sounds are normal. She exhibits no distension and no mass. There is no tenderness. There is no rebound and no guarding.  Musculoskeletal: Normal range of motion. She exhibits no edema or tenderness.  Lymphadenopathy:    She has no cervical adenopathy.  Neurological: She is alert and oriented to person, place, and time. She has normal reflexes. She displays normal reflexes. No cranial nerve deficit. She exhibits normal muscle tone. Coordination normal.  Skin: Skin is warm and dry. No rash noted. She is not diaphoretic. No erythema. No pallor.  Psychiatric: She has a normal mood and affect. Her behavior is normal. Judgment and thought content normal.  Vitals reviewed.   Lab Results  Component Value Date   WBC 5.4 01/23/2015   HGB 13.8 01/23/2015   HCT 40.7 01/23/2015   PLT 279.0 01/23/2015   GLUCOSE 83 01/23/2015   CHOL 159 01/23/2015   TRIG 192.0* 01/23/2015   HDL 36.80* 01/23/2015   LDLCALC 84 01/23/2015    ALT 21 01/23/2015   AST 17 01/23/2015   NA 142 01/23/2015   K 4.3 01/23/2015   CL 107 01/23/2015   CREATININE 0.75 01/23/2015   BUN 23 01/23/2015   CO2 30 01/23/2015   TSH 1.68 01/23/2015   HGBA1C 5.8 01/24/2013    Converted [facility]  10/09/2010   Document Description: Imaging Report Order Summary: [facility]  [facility]   Imported By: Bubba Hales 09/25/2010 10:56:26  _____________________________________________________________________  External Attachment:    Type:   Image     Comment:   External Document    Assessment & Plan:   Rayvin was seen today for annual exam.  Diagnoses and all orders for this visit:  Osteopenia, senile - she is due for a DEXA scan Orders: -     DG Bone Density; Future  Tinnitus aurium, bilateral - will refer to hearing evaluation, will consider getting an MRI done in the future  if neuro s/s return Orders: -     Ambulatory referral to Audiology  Other orders -     ALPRAZolam (XANAX) 0.25 MG tablet; Take 1 tablet (0.25 mg total) by mouth 2 (two) times daily as needed.   I have changed Ms. Jolliffe's ALPRAZolam. I am also having her maintain her aspirin, CALCIUM-MAGNESIUM-ZINC PO, Co Q 10, Vitamin D, fish oil-omega-3 fatty acids, NON FORMULARY, Arginine, multivitamin, olopatadine, vitamin C, clobetasol, PARoxetine, losartan, pramipexole, pravastatin, Albuterol Sulfate, and meclizine.  Meds ordered this encounter  Medications  . ALPRAZolam (XANAX) 0.25 MG tablet    Sig: Take 1 tablet (0.25 mg total) by mouth 2 (two) times daily as needed.    Dispense:  35 tablet    Refill:  2     Follow-up: Return in about 6 months (around 08/22/2015).  Scarlette Calico, MD

## 2015-03-04 ENCOUNTER — Other Ambulatory Visit: Payer: Self-pay

## 2015-03-19 DIAGNOSIS — H9313 Tinnitus, bilateral: Secondary | ICD-10-CM | POA: Diagnosis not present

## 2015-03-19 DIAGNOSIS — H903 Sensorineural hearing loss, bilateral: Secondary | ICD-10-CM | POA: Diagnosis not present

## 2015-03-19 DIAGNOSIS — H8111 Benign paroxysmal vertigo, right ear: Secondary | ICD-10-CM | POA: Diagnosis not present

## 2015-03-22 ENCOUNTER — Other Ambulatory Visit: Payer: Self-pay | Admitting: *Deleted

## 2015-03-22 MED ORDER — LOSARTAN POTASSIUM 50 MG PO TABS: 50.0000 mg | ORAL_TABLET | Freq: Every day | ORAL | Status: DC

## 2015-03-25 ENCOUNTER — Ambulatory Visit
Payer: BC Managed Care – PPO | Attending: Internal Medicine | Admitting: Rehabilitative and Restorative Service Providers"

## 2015-04-02 ENCOUNTER — Ambulatory Visit (INDEPENDENT_AMBULATORY_CARE_PROVIDER_SITE_OTHER)
Admission: RE | Admit: 2015-04-02 | Discharge: 2015-04-02 | Disposition: A | Discharge status: Home / Self Care | Payer: Medicare Other | Source: Ambulatory Visit | Attending: Internal Medicine | Admitting: Internal Medicine

## 2015-04-02 DIAGNOSIS — M8580 Other specified disorders of bone density and structure, unspecified site: Secondary | ICD-10-CM

## 2015-04-02 DIAGNOSIS — M899 Disorder of bone, unspecified: Secondary | ICD-10-CM

## 2015-04-08 ENCOUNTER — Encounter: Payer: Self-pay | Admitting: Internal Medicine

## 2015-04-08 LAB — HM DEXA SCAN: HM Dexa Scan: NORMAL

## 2015-04-08 NOTE — Addendum Note (Signed)
Addended by: Janith Lima on: 04/08/2015 07:55 AM   Modules accepted: Miquel Dunn

## 2015-04-18 ENCOUNTER — Encounter: Payer: Self-pay | Admitting: Internal Medicine

## 2015-04-18 ENCOUNTER — Ambulatory Visit (INDEPENDENT_AMBULATORY_CARE_PROVIDER_SITE_OTHER): Payer: Medicare Other | Admitting: Internal Medicine

## 2015-04-18 VITALS — BP 130/74 | HR 85 | Temp 98.0°F | Resp 16 | Ht 65.0 in

## 2015-04-18 DIAGNOSIS — M5417 Radiculopathy, lumbosacral region: Secondary | ICD-10-CM | POA: Diagnosis not present

## 2015-04-18 DIAGNOSIS — M5416 Radiculopathy, lumbar region: Secondary | ICD-10-CM

## 2015-04-18 DIAGNOSIS — M5441 Lumbago with sciatica, right side: Secondary | ICD-10-CM

## 2015-04-18 DIAGNOSIS — M48061 Spinal stenosis, lumbar region without neurogenic claudication: Secondary | ICD-10-CM | POA: Insufficient documentation

## 2015-04-18 MED ORDER — TRAMADOL HCL 50 MG PO TABS: 50.0000 mg | ORAL_TABLET | Freq: Three times a day (TID) | ORAL | Status: DC | PRN

## 2015-04-18 MED ORDER — CLOBETASOL PROPIONATE 0.05 % EX FOAM: Freq: Two times a day (BID) | CUTANEOUS | Status: DC

## 2015-04-18 NOTE — Progress Notes (Signed)
Pre visit review using our clinic review tool, if applicable. No additional management support is needed unless otherwise documented below in the visit note. 

## 2015-04-18 NOTE — Patient Instructions (Signed)
Back Pain, Adult Low back pain is very common. About 1 in 5 people have back pain.The cause of low back pain is rarely dangerous. The pain often gets better over time.About half of people with a sudden onset of back pain feel better in just 2 weeks. About 8 in 10 people feel better by 6 weeks.  CAUSES Some common causes of back pain include:  Strain of the muscles or ligaments supporting the spine.  Wear and tear (degeneration) of the spinal discs.  Arthritis.  Direct injury to the back. DIAGNOSIS Most of the time, the direct cause of low back pain is not known.However, back pain can be treated effectively even when the exact cause of the pain is unknown.Answering your caregiver's questions about your overall health and symptoms is one of the most accurate ways to make sure the cause of your pain is not dangerous. If your caregiver needs more information, he or she may order lab work or imaging tests (X-rays or MRIs).However, even if imaging tests show changes in your back, this usually does not require surgery. HOME CARE INSTRUCTIONS For many people, back pain returns.Since low back pain is rarely dangerous, it is often a condition that people can learn to manageon their own.   Remain active. It is stressful on the back to sit or stand in one place. Do not sit, drive, or stand in one place for more than 30 minutes at a time. Take short walks on level surfaces as soon as pain allows.Try to increase the length of time you walk each day.  Do not stay in bed.Resting more than 1 or 2 days can delay your recovery.  Do not avoid exercise or work.Your body is made to move.It is not dangerous to be active, even though your back may hurt.Your back will likely heal faster if you return to being active before your pain is gone.  Pay attention to your body when you bend and lift. Many people have less discomfortwhen lifting if they bend their knees, keep the load close to their bodies,and  avoid twisting. Often, the most comfortable positions are those that put less stress on your recovering back.  Find a comfortable position to sleep. Use a firm mattress and lie on your side with your knees slightly bent. If you lie on your back, put a pillow under your knees.  Only take over-the-counter or prescription medicines as directed by your caregiver. Over-the-counter medicines to reduce pain and inflammation are often the most helpful.Your caregiver may prescribe muscle relaxant drugs.These medicines help dull your pain so you can more quickly return to your normal activities and healthy exercise.  Put ice on the injured area.  Put ice in a plastic bag.  Place a towel between your skin and the bag.  Leave the ice on for 15-20 minutes, 03-04 times a day for the first 2 to 3 days. After that, ice and heat may be alternated to reduce pain and spasms.  Ask your caregiver about trying back exercises and gentle massage. This may be of some benefit.  Avoid feeling anxious or stressed.Stress increases muscle tension and can worsen back pain.It is important to recognize when you are anxious or stressed and learn ways to manage it.Exercise is a great option. SEEK MEDICAL CARE IF:  You have pain that is not relieved with rest or medicine.  You have pain that does not improve in 1 week.  You have new symptoms.  You are generally not feeling well. SEEK   IMMEDIATE MEDICAL CARE IF:   You have pain that radiates from your back into your legs.  You develop new bowel or bladder control problems.  You have unusual weakness or numbness in your arms or legs.  You develop nausea or vomiting.  You develop abdominal pain.  You feel faint. Document Released: 08/24/2005 Document Revised: 02/23/2012 Document Reviewed: 12/26/2013 [employer] Patient Information 2015 [employer]. This information is not intended to replace advice given to you by your health care provider. Make sure you  discuss any questions you have with your health care provider.  

## 2015-04-20 ENCOUNTER — Encounter: Payer: Self-pay | Admitting: Internal Medicine

## 2015-04-20 NOTE — Progress Notes (Signed)
Subjective:  Patient ID: Kayla Khan, female    DOB: [birth year]  Age: 64 y.o. MRN: [id number]  CC: Back Pain   HPI Kayla Khan presents for worsening low back pain over the last 3 months. She tells me she has a history of lumbar spinal stenosis. The pain radiates into her right lower extremity and she complains of tingling in her right lower leg. She has taken Tylenol for relief of the discomfort. She has pain at night when she is sleeping and difficulty doing her activities during the day. She is requesting something stronger than Tylenol for pain relief. She cannot take anti-inflammatories due to a history of GI bleeding from medications such as Celebrex.  Outpatient Prescriptions Prior to Visit  Medication Sig Dispense Refill  . Albuterol Sulfate (PROAIR RESPICLICK) 165 (90 BASE) MCG/ACT AEPB Inhale 1 puff into the lungs 4 (four) times daily as needed. 1 each 11  . ALPRAZolam (XANAX) 0.25 MG tablet Take 1 tablet (0.25 mg total) by mouth 2 (two) times daily as needed. 35 tablet 2  . Arginine 1000 MG TABS Take 2 tablets by mouth 2 (two) times daily.     . Ascorbic Acid (VITAMIN C) 1000 MG tablet Take 1,000 mg by mouth daily.      Marland Kitchen aspirin 81 MG tablet Take 81 mg by mouth daily.      Marland Kitchen CALCIUM-MAGNESIUM-ZINC PO Take 1 tablet by mouth daily.      . Cholecalciferol (VITAMIN D) 2000 UNITS CAPS Take 1 capsule by mouth daily.      . Coenzyme Q10 (CO Q 10) 100 MG CAPS Take 1 tablet by mouth daily.      . fish oil-omega-3 fatty acids 1000 MG capsule Take 3 g by mouth daily.      Marland Kitchen losartan (COZAAR) 50 MG tablet Take 1 tablet (50 mg total) by mouth daily. 30 tablet 12  . Multiple Vitamin (MULTIVITAMIN) tablet Take 1 tablet by mouth daily.      . NON FORMULARY CITRULLINE 500 mg -- twice daily     . olopatadine (PATANOL) 0.1 % ophthalmic solution Place 1 drop into both eyes 2 (two) times daily.      Marland Kitchen PARoxetine (PAXIL-CR) 25 MG 24 hr tablet take 1 tablet by mouth once daily 90 tablet 3  .  pravastatin (PRAVACHOL) 40 MG tablet take 1 tablet by mouth once daily 90 tablet 3  . clobetasol (OLUX) 0.05 % topical foam Apply topically 2 (two) times daily.    . meclizine (ANTIVERT) 12.5 MG tablet Take 1 tablet (12.5 mg total) by mouth 3 (three) times daily as needed for dizziness. 65 tablet 1  . pramipexole (MIRAPEX) 0.5 MG tablet Take 1 tablet (0.5 mg total) by mouth daily as needed. 30 tablet 0   No facility-administered medications prior to visit.    ROS Review of Systems  Constitutional: Negative.  Negative for fever, chills, diaphoresis, appetite change and fatigue.  HENT: Negative.   Eyes: Negative.   Respiratory: Negative.   Cardiovascular: Negative.  Negative for palpitations and leg swelling.  Gastrointestinal: Negative.  Negative for nausea, vomiting, abdominal pain, diarrhea, constipation and blood in stool.  Endocrine: Negative.   Genitourinary: Negative.   Musculoskeletal: Positive for back pain. Negative for myalgias, joint swelling, arthralgias, gait problem, neck pain and neck stiffness.  Skin: Negative.   Allergic/Immunologic: Negative.   Neurological: Negative.  Negative for dizziness, speech difficulty, weakness, numbness and headaches.  Hematological: Negative.  Negative for adenopathy. Does not  bruise/bleed easily.  Psychiatric/Behavioral: Negative.     Objective:  BP 130/74 mmHg  Pulse 85  Temp(Src) 98 F (36.7 C) (Oral)  Resp 16  Ht 5\' 5"  (1.651 m)  SpO2 94%  BP Readings from Last 3 Encounters:  04/18/15 130/74  02/20/15 138/82  01/23/15 116/72    Wt Readings from Last 3 Encounters:  02/20/15 198 lb (89.812 kg)  01/23/15 199 lb (90.266 kg)  11/30/14 193 lb (87.544 kg)    Physical Exam  Constitutional: She is oriented to person, place, and time. No distress.  HENT:  Mouth/Throat: Oropharynx is clear and moist. No oropharyngeal exudate.  Eyes: Conjunctivae are normal. Right eye exhibits no discharge. Left eye exhibits no discharge. No  scleral icterus.  Neck: Normal range of motion. Neck supple. No JVD present. No tracheal deviation present. No thyromegaly present.  Cardiovascular: Normal rate, normal heart sounds and intact distal pulses.  Exam reveals no gallop and no friction rub.   No murmur heard. Pulmonary/Chest: Effort normal. No stridor. No respiratory distress. She has no wheezes. She has no rales. She exhibits no tenderness.  Abdominal: Soft. Bowel sounds are normal. She exhibits no distension and no mass. There is no tenderness. There is no rebound and no guarding.  Musculoskeletal: Normal range of motion. She exhibits no edema or tenderness.  Lymphadenopathy:    She has no cervical adenopathy.  Neurological: She is alert and oriented to person, place, and time. She has normal strength. She displays no atrophy, no tremor and normal reflexes. No cranial nerve deficit or sensory deficit. She exhibits normal muscle tone. She displays a negative Romberg sign. She displays no seizure activity. Coordination and gait normal.  Reflex Scores:      Tricep reflexes are 1+ on the right side and 1+ on the left side.      Bicep reflexes are 1+ on the right side and 1+ on the left side.      Brachioradialis reflexes are 1+ on the right side and 1+ on the left side.      Patellar reflexes are 1+ on the right side and 1+ on the left side.      Achilles reflexes are 1+ on the right side and 1+ on the left side. Positive straight leg raise over the right lower extremity, negative straight leg raise of the left lower extremity  Skin: Skin is warm and dry. No rash noted. She is not diaphoretic. No erythema. No pallor.  Psychiatric: She has a normal mood and affect. Her behavior is normal. Judgment and thought content normal.  Vitals reviewed.   Lab Results  Component Value Date   WBC 5.4 01/23/2015   HGB 13.8 01/23/2015   HCT 40.7 01/23/2015   PLT 279.0 01/23/2015   GLUCOSE 83 01/23/2015   CHOL 159 01/23/2015   TRIG 192.0*  01/23/2015   HDL 36.80* 01/23/2015   LDLCALC 84 01/23/2015   ALT 21 01/23/2015   AST 17 01/23/2015   NA 142 01/23/2015   K 4.3 01/23/2015   CL 107 01/23/2015   CREATININE 0.75 01/23/2015   BUN 23 01/23/2015   CO2 30 01/23/2015   TSH 1.68 01/23/2015   HGBA1C 5.8 01/24/2013    Dg Bone Density  04/07/2015   Date of study: 04/02/15 Exam: DUAL X-RAY ABSORPTIOMETRY (DXA) FOR BONE MINERAL DENSITY (BMD) Instrument: [employer] Provider: PCP Indication: screening for osteoporosis Comparison: none (please note that it is not possible to compare data from  different instruments) Clinical  data: Pt is a postmenopausal 64 y.o. female with previous history  of fracture.  Results:  Lumbar spine (L1-L4) Femoral neck (FN) 33% distal radius  T-score -0.7 RFN:-0.8 LFN:-1.0 n/a  Change in BMD from previous DXA test (%) n/a n/a n/a  (*) statistically significant  Assessment: the BMD is normal according to the [facility] classification for  osteoporosis (see below).  Fracture risk: low FRAX score: not calculated due to normal BMD Comments: the technical quality of the study is good Recommend optimizing calcium (1200 mg/day) and vitamin D (800 IU/day)  intake. No pharmacological treatment is indicated. Followup: Repeat BMD is appropriate after 2 years.  WHO criteria for diagnosis of osteoporosis in postmenopausal women and in  men 70 y/o or older:  - normal: T-score -1.0 to + 1.0 - osteopenia/low bone density: T-score between -2.5 and -1.0 - osteoporosis: T-score below -2.5 - severe osteoporosis: T-score below -2.5 with history of fragility  fracture Note: although not part of the WHO classification, the presence of a  fragility fracture, regardless of the T-score, should be considered  diagnostic of osteoporosis, provided other causes for the fracture have  been excluded.  Loura Pardon MD      Assessment & Plan:   Johnnisha was seen today for back pain.  Diagnoses and all orders for this visit:  Right lumbar  radiculitis- she has a positive straight leg raise and tells me she has a history of spinal stenosis, I have ordered an MRI of her lumbar spine to see how severe this is. -     traMADol (ULTRAM) 50 MG tablet; Take 1 tablet (50 mg total) by mouth every 8 (eight) hours as needed. -     Ambulatory referral to Physical Therapy -     MR Lumbar Spine Wo Contrast; Future  Right-sided low back pain with right-sided sciatica- she will continue Tylenol for pain but I have also added tramadol for additional pain relief. She will start physical therapy as well. -     traMADol (ULTRAM) 50 MG tablet; Take 1 tablet (50 mg total) by mouth every 8 (eight) hours as needed. -     Ambulatory referral to Physical Therapy -     MR Lumbar Spine Wo Contrast; Future  Other orders -     clobetasol (OLUX) 0.05 % topical foam; Apply topically 2 (two) times daily.   I have discontinued Ms. Downen's pramipexole and meclizine. I am also having her start on traMADol. Additionally, I am having her maintain her aspirin, CALCIUM-MAGNESIUM-ZINC PO, Co Q 10, Vitamin D, fish oil-omega-3 fatty acids, NON FORMULARY, Arginine, multivitamin, olopatadine, vitamin C, PARoxetine, pravastatin, Albuterol Sulfate, ALPRAZolam, losartan, and clobetasol.  Meds ordered this encounter  Medications  . clobetasol (OLUX) 0.05 % topical foam    Sig: Apply topically 2 (two) times daily.    Dispense:  50 g    Refill:  5  . traMADol (ULTRAM) 50 MG tablet    Sig: Take 1 tablet (50 mg total) by mouth every 8 (eight) hours as needed.    Dispense:  65 tablet    Refill:  2     Follow-up: Return in about 2 months (around 06/18/2015).  Scarlette Calico, MD

## 2015-04-26 ENCOUNTER — Ambulatory Visit: Payer: Medicare Other | Attending: Internal Medicine | Admitting: Rehabilitative and Restorative Service Providers"

## 2015-04-26 DIAGNOSIS — M5441 Lumbago with sciatica, right side: Secondary | ICD-10-CM | POA: Insufficient documentation

## 2015-04-26 DIAGNOSIS — R531 Weakness: Secondary | ICD-10-CM | POA: Diagnosis not present

## 2015-04-26 NOTE — Therapy (Addendum)
[place], [place], [address] Phone: [phone]   Fax:  [phone]  Physical Therapy Evaluation  Patient Details  Name: Kayla Khan MRN: [id number] Date of Birth: 1951-03-17 Referring Provider:  Janith Lima, MD  Encounter Date: 04/26/2015      PT End of Session - 04/26/15 0936    Visit Number 1   Number of Visits 12   Date for PT Re-Evaluation 06/07/15   PT Start Time 0848   PT Stop Time 0930   PT Time Calculation (min) 42 min   Activity Tolerance Patient tolerated treatment well   Behavior During Therapy [facility] for tasks assessed/performed      Past Medical History  Diagnosis Date  . 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 Diverticulosis of colon   . HLD (hyperlipidemia)   . Allergic conjunctivitis   . Fatigue   . Angina pectoris   . Thyroid nodule     The patient had a thyroid nodule that was surgically removed in the past.  She has one half of her thyroid and does not take replacement of Taxol and he was benign  . Diverticulosis of colon   . Sleep apnea   . DJD (degenerative joint disease)   . Depression with anxiety   . Anemia, iron deficiency   . GERD (gastroesophageal reflux disease)   . Palpitations   . Arthritis   . Asthma   . HLA B27 positive   . Ejection fraction     70%, catheter 2004 / normal LV function nuclear, 2008 ( no echo data as of December 23, 2010)    Past Surgical History  Procedure Laterality Date  . Vesicovaginal fistula closure w/ tah    . Breast biopsy    . Tubal ligation    . Appendectomy    . Cesarean section      x2  . Ganglion cyst excision    . Thyroidectomy, partial      left    There were no vitals filed for this visit.  Visit Diagnosis:  Right-sided low back pain with right-sided sciatica  Weakness      Subjective Assessment - 04/26/15 0851    Subjective Pt reports R sided sciatica beginning at the end of the school year; Plantarflexion decreases pain; supine positioning increases pain; walking small amounts decreases pain   Patient is accompained by: Family member   Limitations Sitting;Standing   How long can you sit comfortably? > 30 min depending on the position   How long can you stand comfortably? depends on activity   How long can you walk comfortably? depends; sometimes the pain is present from the beginning   Diagnostic tests MRI to be scheduled   Currently in Pain? Yes   Pain Score 4    Pain Location Back   Pain Orientation Right   Pain Descriptors / Indicators Aching   Pain Type Chronic pain   Pain Radiating Towards R lateral calf   Pain Onset 1 to 4 weeks ago   Pain Frequency Intermittent   Aggravating Factors  lumbar extension   Pain Relieving Factors lumbar flexion   Effect of Pain on Daily Activities pt is able to perform activities with pain   Multiple Pain Sites No            [facility] PT Assessment - 04/26/15 0001    Assessment  Medical Diagnosis LBP with R sciatica   Next MD Visit to be scheduled   Precautions   Precautions None   Balance Screen   Has the patient fallen in the past 6 months Yes   How many times? 1   Has the patient had a decrease in activity level because of a fear of falling?  No   Is the patient reluctant to leave their home because of a fear of falling?  No   Posture/Postural Control   Posture Comments neutral spine posture, increased lordosis   ROM / Strength   AROM / PROM / Strength AROM   AROM   Overall AROM Comments Lumbar AROM WNL except bil rotation 50% limited with more discomfort with L rotation; spinal extension increased R LE radicular sxs and R SI pain   Flexibility   Soft Tissue Assessment /Muscle Length yes   Hamstrings tight bil   Piriformis R Piriformis/glute med tighter    Palpation   Palpation comment L1, 3 hypomobile; increased tightness R perisacral  musculature and glute med, increased lumbar lordosis   Special Tests    Special Tests Lumbar   Lumbar Tests Straight Leg Raise;other   Straight Leg Raise   Findings Negative   other   Comments Piriformis test + for R gluteal pain; repeated movements test + for flexion decreasing pain and ext increasing pain             [facility] PT Assessment - 04/26/15 0001    Assessment   Medical Diagnosis LBP with R sciatica   Next MD Visit to be scheduled   Precautions   Precautions None   Balance Screen   Has the patient fallen in the past 6 months Yes   How many times? 1   Has the patient had a decrease in activity level because of a fear of falling?  No   Is the patient reluctant to leave their home because of a fear of falling?  No   Posture/Postural Control   Posture Comments neutral spine posture, increased lordosis   ROM / Strength   AROM / PROM / Strength AROM   AROM   Overall AROM Comments Lumbar AROM WNL except bil rotation 50% limited with more discomfort with L rotation; spinal extension increased R LE radicular sxs and R SI pain   Flexibility   Soft Tissue Assessment /Muscle Length yes   Hamstrings tight bil   Piriformis R Piriformis/glute med tighter    Palpation   Palpation comment L1, 3 hypomobile; increased tightness R perisacral musculature and glute med, increased lumbar lordosis   Special Tests    Special Tests Lumbar   Lumbar Tests Straight Leg Raise;other   Straight Leg Raise   Findings Negative   other   Comments Piriformis test + for R gluteal pain; repeated movements test + for flexion decreasing pain and ext increasing pain                          PT Short Term Goals - 04/26/15 0932    PT SHORT TERM GOAL #1   Title Pt will be I with initial HEP   Time 2   Period Weeks   Status New   PT SHORT TERM GOAL #2   Title Pt will report gait x 30 min with </= 2/10 pain to shop   Time 2   Period Weeks   Status New   PT SHORT TERM GOAL #3  Title Pt will be able to R sidelye with 50% less difficulty for sleeping   Time 2   Period Weeks   Status New   PT SHORT TERM GOAL #4   Title Pt will be able to lie supine with 50% less difficulty for resting   Time 2   Period Weeks   Status New           PT Long Term Goals - 04/26/15 0934    PT LONG TERM GOAL #1   Title Pt will be able to tolerate performing housework with proper body mechanics with 75% less difficulty   Time 6   Period Weeks   Status New   PT LONG TERM GOAL #2   Title Pt will report overall LE radicular sxs R to have decreased >/= 50% with functional mobility   Time 6   Period Weeks   Status New   PT LONG TERM GOAL #3   Title Pt will be able to perform water activities without pain   Time 6   Period Weeks   Status New               Plan - 04/26/15 0930    Clinical Impression Statement Pt presents to PT with LBP with R sciatica; pain increases with extension; definite flexion preference. pt with weak lumbar/core muscles and would benefit from therapy for strengthening of the above    Pt will benefit from skilled therapeutic intervention in order to improve on the following deficits Difficulty walking;Pain;Hypomobility   Rehab Potential Excellent   Clinical Impairments Affecting Rehab Potential none   PT Frequency 2x / week   PT Duration 6 weeks   PT Treatment/Interventions Electrical Stimulation;Moist Heat;Ultrasound;Therapeutic exercise;Therapeutic activities;Functional mobility training;Patient/family education;Manual techniques;Taping;Dry needling   PT Next Visit Plan issue 1 core stabilization exercise and then assess tolerance to it.   PT Home Exercise Plan not issued; see pt instructions   Consulted and Agree with Plan of Care Patient         Problem List Patient Active Problem List   Diagnosis Date Noted  . Right lumbar radiculitis 04/18/2015  . Right-sided low back pain with right-sided sciatica 04/18/2015  . Tinnitus aurium  02/21/2015  . Osteopenia, senile 02/20/2015  . BPPV (benign paroxysmal positional vertigo) 01/23/2015  . Obesity (BMI 30.0-34.9) 03/22/2013  . RLS (restless legs syndrome) 04/18/2012  . Routine general medical examination at a health care facility 10/19/2011  . Thyroid nodule   . Hyperglycemia 07/05/2009  . Hyperlipidemia with target LDL less than 130 12/11/2008  . Iron deficiency anemia 05/03/2008  . DEPRESSION/ANXIETY 05/03/2008  . Essential hypertension 05/03/2008  . Asthma 05/03/2008  . GERD 05/03/2008  . DEGENERATIVE JOINT DISEASE 05/03/2008  . OSA (obstructive sleep apnea) 05/03/2008    Myra Rude, PT 04/26/2015, 9:38 AM  [facility] [address] [place], [place], [address] Phone: [phone]   Fax:  [phone]    LATE ENTRY G-CODE - Clinical Judgement Category: Mobility Current: CK Goal: CI   Romualdo Bolk, PT, DPT 05/21/2015 10:46 AM

## 2015-04-26 NOTE — Patient Instructions (Signed)
Pt performs her own HEP; reviewed it with her consisting of knee to chest, knee to opposite shoulder; figure 4, supine ITB stretch, supine hamstring stretch; advised her to add in R ankle pump for nerve glossing. Pt used to perform standing runners stretch (she discontinued); she enjoys water walking but has discomfort; advised her to perform pelvic tilt with water walking and land ambulation; she said many times when she adds on an exercise she is not painful during but painful later. Told her i would let therapists know to only add on 1 exercise at a time to assess tolerance

## 2015-05-07 ENCOUNTER — Ambulatory Visit: Payer: Medicare Other | Admitting: Physical Therapy

## 2015-05-07 ENCOUNTER — Ambulatory Visit
Admission: RE | Admit: 2015-05-07 | Discharge: 2015-05-07 | Disposition: A | Discharge status: Home / Self Care | Payer: BC Managed Care – PPO | Source: Ambulatory Visit | Attending: Internal Medicine | Admitting: Internal Medicine

## 2015-05-07 DIAGNOSIS — R531 Weakness: Secondary | ICD-10-CM

## 2015-05-07 DIAGNOSIS — M5441 Lumbago with sciatica, right side: Secondary | ICD-10-CM

## 2015-05-07 DIAGNOSIS — M5416 Radiculopathy, lumbar region: Secondary | ICD-10-CM

## 2015-05-07 DIAGNOSIS — M4806 Spinal stenosis, lumbar region: Secondary | ICD-10-CM | POA: Diagnosis not present

## 2015-05-07 NOTE — Patient Instructions (Signed)

## 2015-05-07 NOTE — Therapy (Signed)
[place], [place], [address] Phone: [phone]   Fax:  [phone]  Physical Therapy Treatment  Patient Details  Name: Kayla Khan MRN: [id number] Date of Birth: [birth year] Referring Provider:  Janith Lima, MD  Encounter Date: 05/07/2015      PT End of Session - 05/07/15 0932    Visit Number 2   Number of Visits 12   Date for PT Re-Evaluation 06/07/15   PT Start Time 0846   PT Stop Time 0928   PT Time Calculation (min) 42 min      Past Medical History  Diagnosis Date  . 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 Diverticulosis of colon   . HLD (hyperlipidemia)   . Allergic conjunctivitis   . Fatigue   . Angina pectoris   . Thyroid nodule     The patient had a thyroid nodule that was surgically removed in the past.  She has one half of her thyroid and does not take replacement of Taxol and he was benign  . Diverticulosis of colon   . Sleep apnea   . DJD (degenerative joint disease)   . Depression with anxiety   . Anemia, iron deficiency   . GERD (gastroesophageal reflux disease)   . Palpitations   . Arthritis   . Asthma   . HLA B27 positive   . Ejection fraction     70%, catheter 2004 / normal LV function nuclear, 2008 ( no echo data as of December 23, 2010)    Past Surgical History  Procedure Laterality Date  . Vesicovaginal fistula closure w/ tah    . Breast biopsy    . Tubal ligation    . Appendectomy    . Cesarean section      x2  . Ganglion cyst excision    . Thyroidectomy, partial      left    There were no vitals filed for this visit.  Visit Diagnosis:  Right-sided low back pain with right-sided sciatica  Weakness      Subjective Assessment - 05/07/15 0849    Subjective No pain at rest up to 4/10 with movement. I treid water aerobics 2 days in a row and it was too much   Currently in Pain? No/denies   Aggravating Factors  standing                         [facility] Adult PT Treatment/Exercise - 05/07/15 0906    Lumbar Exercises: Supine   Ab Set 5 reps   AB Set Limitations Tra activation training   Clam 20 reps   Heel Slides 20 reps   Bent Knee Raise 20 reps   Modalities   Modalities Ultrasound   Ultrasound   Ultrasound Location R SI/sacral border   Ultrasound Parameters 50% 1.2 w/cm2 1 mhz x 8 min   Ultrasound Goals Pain                PT Education - 05/07/15 0929    Education provided Yes   Education Details prepilates HEP   Person(s) Educated Patient   Methods Explanation;Handout   Comprehension Verbalized understanding          PT Short Term Goals - 04/26/15 0932    PT SHORT TERM GOAL #1   Title Pt will be I with initial HEP  Time 2   Period Weeks   Status New   PT SHORT TERM GOAL #2   Title Pt will report gait x 30 min with </= 2/10 pain to shop   Time 2   Period Weeks   Status New   PT SHORT TERM GOAL #3   Title Pt will be able to R sidelye with 50% less difficulty for sleeping   Time 2   Period Weeks   Status New   PT SHORT TERM GOAL #4   Title Pt will be able to lie supine with 50% less difficulty for resting   Time 2   Period Weeks   Status New           PT Long Term Goals - 04/26/15 0934    PT LONG TERM GOAL #1   Title Pt will be able to tolerate performing housework with proper body mechanics with 75% less difficulty   Time 6   Period Weeks   Status New   PT LONG TERM GOAL #2   Title Pt will report overall LE radicular sxs R to have decreased >/= 50% with functional mobility   Time 6   Period Weeks   Status New   PT LONG TERM GOAL #3   Title Pt will be able to perform water activities without pain   Time 6   Period Weeks   Status New               Plan - 05/07/15 1610    Clinical Impression Statement Quick review of Stretching exercises on right side, pt is  independent, added prepilates HEP. Pt experienced increased pain with heel slides. She is returning tomorrow. Advised to hold HEP until after treatment. Trial of ultrasound today areound right sacrum/SI with pt reporting decreased soreness afterwards.    PT Next Visit Plan review prepilates HEP, repeat US if helpful.         Problem List Patient Active Problem List   Diagnosis Date Noted  . Right lumbar radiculitis 04/18/2015  . Right-sided low back pain with right-sided sciatica 04/18/2015  . Tinnitus aurium 02/21/2015  . Osteopenia, senile 02/20/2015  . BPPV (benign paroxysmal positional vertigo) 01/23/2015  . Obesity (BMI 30.0-34.9) 03/22/2013  . RLS (restless legs syndrome) 04/18/2012  . Routine general medical examination at a health care facility 10/19/2011  . Thyroid nodule   . Hyperglycemia 07/05/2009  . Hyperlipidemia with target LDL less than 130 12/11/2008  . Iron deficiency anemia 05/03/2008  . DEPRESSION/ANXIETY 05/03/2008  . Essential hypertension 05/03/2008  . Asthma 05/03/2008  . GERD 05/03/2008  . DEGENERATIVE JOINT DISEASE 05/03/2008  . OSA (obstructive sleep apnea) 05/03/2008    Dorene Ar, PTA 05/07/2015, 9:33 AM  [place], [place], [address] Phone: [phone]   Fax:  [phone]

## 2015-05-08 ENCOUNTER — Other Ambulatory Visit: Payer: Self-pay | Admitting: Internal Medicine

## 2015-05-08 ENCOUNTER — Encounter: Payer: Self-pay | Admitting: Internal Medicine

## 2015-05-08 ENCOUNTER — Ambulatory Visit: Payer: Medicare Other | Admitting: Physical Therapy

## 2015-05-08 DIAGNOSIS — R531 Weakness: Secondary | ICD-10-CM

## 2015-05-08 DIAGNOSIS — M5416 Radiculopathy, lumbar region: Principal | ICD-10-CM

## 2015-05-08 DIAGNOSIS — M5441 Lumbago with sciatica, right side: Secondary | ICD-10-CM

## 2015-05-08 DIAGNOSIS — M48061 Spinal stenosis, lumbar region without neurogenic claudication: Secondary | ICD-10-CM

## 2015-05-08 NOTE — Therapy (Signed)
[place], [place], [address] Phone: [phone]   Fax:  [phone]  Physical Therapy Treatment  Patient Details  Name: Kayla Khan MRN: [id number] Date of Birth: 07/19/1951 Referring Provider:  Janith Lima, MD  Encounter Date: 05/08/2015      PT End of Session - 05/08/15 0900    Visit Number 3   Number of Visits 12   Date for PT Re-Evaluation 06/07/15   PT Start Time 0848   PT Stop Time 0935   PT Time Calculation (min) 47 min      Past Medical History  Diagnosis Date  . 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 Diverticulosis of colon   . HLD (hyperlipidemia)   . Allergic conjunctivitis   . Fatigue   . Angina pectoris   . Thyroid nodule     The patient had a thyroid nodule that was surgically removed in the past.  She has one half of her thyroid and does not take replacement of Taxol and he was benign  . Diverticulosis of colon   . Sleep apnea   . DJD (degenerative joint disease)   . Depression with anxiety   . Anemia, iron deficiency   . GERD (gastroesophageal reflux disease)   . Palpitations   . Arthritis   . Asthma   . HLA B27 positive   . Ejection fraction     70%, catheter 2004 / normal LV function nuclear, 2008 ( no echo data as of December 23, 2010)    Past Surgical History  Procedure Laterality Date  . Vesicovaginal fistula closure w/ tah    . Breast biopsy    . Tubal ligation    . Appendectomy    . Cesarean section      x2  . Ganglion cyst excision    . Thyroidectomy, partial      left    There were no vitals filed for this visit.  Visit Diagnosis:  Right-sided low back pain with right-sided sciatica  Weakness      Subjective Assessment - 05/08/15 0858    Subjective Not bad today. I have noticed a tingling between my toes starting yesterday. I had a lumbar MRI last  night. I have decided not to take the tramadol.    Currently in Pain? Yes   Pain Score 2    Pain Location Back   Pain Orientation Right   Pain Descriptors / Indicators Aching   Pain Type Chronic pain            [facility] PT Assessment - 05/08/15 0908    ROM / Strength   AROM / PROM / Strength Strength   Strength   Strength Assessment Site Hip;Ankle   Right/Left Hip Right;Left   Right Hip Flexion 4-/5   Right Hip Extension 4-/5   Right Hip ABduction 4/5   Right Hip ADduction 4/5   Left Hip Flexion 4-/5   Left Hip Extension 4-/5   Left Hip ABduction 4/5   Left Hip ADduction 4/5   Right/Left Ankle Right;Left   Right Ankle Dorsiflexion 4-/5   Right Ankle Plantar Flexion 4/5  20 heel raises single leg   Right Ankle Inversion 4/5   Right Ankle Eversion 4/5   Left Ankle Dorsiflexion 4/5   Left Ankle Plantar Flexion 4/5  15 heel raises single   Left Ankle Inversion 4/5  Left Ankle Eversion 4/5                     [facility] Adult PT Treatment/Exercise - 05/08/15 0948    Lumbar Exercises: Stretches   Lower Trunk Rotation 5 reps   Lumbar Exercises: Standing   Heel Raises 15 reps;20 reps  sigle leg trials   Lumbar Exercises: Supine   Clam 20 reps   Clam Limitations neutral spine   Heel Slides 20 reps   Heel Slides Limitations neutral spine   Bent Knee Raise 20 reps   Bent Knee Raise Limitations neutral spine   Bridge 10 reps   Bridge Limitations neutral spine cues   Straight Leg Raise 10 reps   Straight Leg Raises Limitations neutral spine   Other Supine Lumbar Exercises DF with grren band, also done sitting for HEP x 10 right   Modalities   Modalities Ultrasound   Ultrasound   Ultrasound Location R SI/ Sacral Border   Ultrasound Parameters 50% 1.2 w/cm2 100% 1 MHZ   Ultrasound Goals Pain   Ankle Exercises: Stretches   Gastroc Stretch 3 reps;30 seconds   Gastroc Stretch Limitations ball of foot in cabinet                PT Education - 05/08/15  [phone]    Education provided Yes   Education Details DF green band right, Bridge with neutral spine, SLR with neutral spine    Person(s) Educated Patient   Methods Explanation;Handout   Comprehension Verbalized understanding          PT Short Term Goals - 04/26/15 0932    PT SHORT TERM GOAL #1   Title Pt will be I with initial HEP   Time 2   Period Weeks   Status New   PT SHORT TERM GOAL #2   Title Pt will report gait x 30 min with </= 2/10 pain to shop   Time 2   Period Weeks   Status New   PT SHORT TERM GOAL #3   Title Pt will be able to R sidelye with 50% less difficulty for sleeping   Time 2   Period Weeks   Status New   PT SHORT TERM GOAL #4   Title Pt will be able to lie supine with 50% less difficulty for resting   Time 2   Period Weeks   Status New           PT Long Term Goals - 04/26/15 0934    PT LONG TERM GOAL #1   Title Pt will be able to tolerate performing housework with proper body mechanics with 75% less difficulty   Time 6   Period Weeks   Status New   PT LONG TERM GOAL #2   Title Pt will report overall LE radicular sxs R to have decreased >/= 50% with functional mobility   Time 6   Period Weeks   Status New   PT LONG TERM GOAL #3   Title Pt will be able to perform water activities without pain   Time 6   Period Weeks   Status New               Plan - 05/08/15 0910    Clinical Impression Statement Review of Lumbar stab focusing on neutral spine. Pt is independet. Tested pt's LE strength due to spinal stenosis. She demonstrates right DF weakness, and bilateral hip flexion and extension weakness. Instructed pt in supine SLR and  supine bridges focusing on neutral spine without increase in symptoms. Also instructed in right DF with green band. All added to HEP. Repeated US to right lower back.    PT Next Visit Plan Review stab and LE strength, pain management as needed.         Problem List Patient Active Problem List   Diagnosis  Date Noted  . Right lumbar radiculitis 04/18/2015  . Right-sided low back pain with right-sided sciatica 04/18/2015  . Tinnitus aurium 02/21/2015  . Osteopenia, senile 02/20/2015  . BPPV (benign paroxysmal positional vertigo) 01/23/2015  . Obesity (BMI 30.0-34.9) 03/22/2013  . RLS (restless legs syndrome) 04/18/2012  . Routine general medical examination at a health care facility 10/19/2011  . Thyroid nodule   . Hyperglycemia 07/05/2009  . Hyperlipidemia with target LDL less than 130 12/11/2008  . Iron deficiency anemia 05/03/2008  . DEPRESSION/ANXIETY 05/03/2008  . Essential hypertension 05/03/2008  . Asthma 05/03/2008  . GERD 05/03/2008  . DEGENERATIVE JOINT DISEASE 05/03/2008  . OSA (obstructive sleep apnea) 05/03/2008    Dorene Ar , PTA  05/08/2015, 9:54 AM  [facility] [address] [place], [place], [address] Phone: [phone]   Fax:  [phone]

## 2015-05-08 NOTE — Patient Instructions (Signed)
Straight Leg Raise   Tighten stomach and slowly raise locked right leg __12__ inches from floor. Repeat _10___ times per set. Do __2__ sets per session. Do _2___ sessions per day.  http://orth.exer.us/1103   Copyright  VHI. All rights reserved.  Bridge   Lie back, legs bent. Inhale, pressing hips up. Keeping ribs in, lengthen lower back. Exhale, rolling down along spine from top. Repeat _10___ times. Do ___2_ sessions per day. KEEP NEUTRAL SPINE  Copyright  VHI. All rights reserved.  Dorsiflexion: Resisted   Facing anchor, tubing around left foot, pull toward face.  Repeat __10__ times per set. Do _2___ sets per session. Do _2___ sessions per day.  http://orth.exer.us/9   Copyright  VHI. All rights reserved.

## 2015-05-14 ENCOUNTER — Ambulatory Visit: Payer: Medicare Other | Attending: Internal Medicine | Admitting: Physical Therapy

## 2015-05-14 DIAGNOSIS — R531 Weakness: Secondary | ICD-10-CM | POA: Insufficient documentation

## 2015-05-14 DIAGNOSIS — M5441 Lumbago with sciatica, right side: Secondary | ICD-10-CM | POA: Diagnosis not present

## 2015-05-14 NOTE — Patient Instructions (Signed)
Added handouts abdominal brace series Grade 1 to 3:  5 reps each daily:  Ab brace, ab brace with hand to knee push, ab brace with bent knee raise and lower

## 2015-05-14 NOTE — Therapy (Signed)
[place], [place], [address] Phone: [phone]   Fax:  [phone]  Physical Therapy Treatment  Patient Details  Name: Kayla Khan MRN: [id number] Date of Birth: 1950/12/29 Referring Provider:  Janith Lima, MD  Encounter Date: 05/14/2015      PT End of Session - 05/14/15 1000    Visit Number 4   Number of Visits 12   Date for PT Re-Evaluation 06/07/15   PT Start Time 0850   PT Stop Time 0940   PT Time Calculation (min) 50 min   Activity Tolerance Patient tolerated treatment well      Past Medical History  Diagnosis Date  . 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 Diverticulosis of colon   . HLD (hyperlipidemia)   . Allergic conjunctivitis   . Fatigue   . Angina pectoris   . Thyroid nodule     The patient had a thyroid nodule that was surgically removed in the past.  She has one half of her thyroid and does not take replacement of Taxol and he was benign  . Diverticulosis of colon   . Sleep apnea   . DJD (degenerative joint disease)   . Depression with anxiety   . Anemia, iron deficiency   . GERD (gastroesophageal reflux disease)   . Palpitations   . Arthritis   . Asthma   . HLA B27 positive   . Ejection fraction     70%, catheter 2004 / normal LV function nuclear, 2008 ( no echo data as of December 23, 2010)    Past Surgical History  Procedure Laterality Date  . Vesicovaginal fistula closure w/ tah    . Breast biopsy    . Tubal ligation    . Appendectomy    . Cesarean section      x2  . Ganglion cyst excision    . Thyroidectomy, partial      left    There were no vitals filed for this visit.  Visit Diagnosis:  Right-sided low back pain with right-sided sciatica  Weakness      Subjective Assessment - 05/14/15 0852    Subjective MRI showed lumbar bulging discs and stenosis with  nerve impingement expected.  Standing is difficult keeping me from doing what I want.  Sewing using foot pedal aggravated.  Going on hiking trip in October.  I think the PT is helping especially the [place]     Currently in Pain? Yes   Pain Score 0-No pain   Aggravating Factors  standing for a few minutes   Pain Relieving Factors no pain sitting; left sidelying                         [facility] Adult PT Treatment/Exercise - 05/14/15 0001    Lumbar Exercises: Aerobic   Stationary Bike Nu-Step L4 7 min UE/LEs   Lumbar Exercises: Seated   Other Seated Lumbar Exercises Neural flossing 15x on tall table 15x R/L   Lumbar Exercises: Supine   Ab Set 5 reps   Bent Knee Raise 10 reps;2 seconds   Isometric Hip Flexion 10 reps;2 seconds   Other Supine Lumbar Exercises Review of previous HEP   Ultrasound   Ultrasound Location Right SI/piriformis   Ultrasound Parameters 50% 1.2 W/cm2 1 MHz   Ultrasound Goals Pain  PT Education - 05/14/15 1000    Education provided Yes   Education Details abdominal brace series; dry needling general info sheet   Person(s) Educated Patient   Methods Explanation;Demonstration;Handout   Comprehension Verbalized understanding;Returned demonstration          PT Short Term Goals - 05/14/15 1006    PT SHORT TERM GOAL #1   Title Pt will be I with initial HEP   Status Achieved   PT SHORT TERM GOAL #2   Title Pt will report gait x 30 min with </= 2/10 pain to shop   Time 2   Period Weeks   Status On-going   PT SHORT TERM GOAL #3   Title Pt will be able to R sidelye with 50% less difficulty for sleeping   Time 2   Period Weeks   Status On-going   PT SHORT TERM GOAL #4   Title Pt will be able to lie supine with 50% less difficulty for resting   Time 2   Period Weeks   Status On-going           PT Long Term Goals - 05/14/15 1007    PT LONG TERM GOAL #1   Title Pt will be able to tolerate performing housework with  proper body mechanics with 75% less difficulty   Time 6   Period Weeks   Status On-going   PT LONG TERM GOAL #2   Title Pt will report overall LE radicular sxs R to have decreased >/= 50% with functional mobility   Time 6   Period Weeks   Status On-going   PT LONG TERM GOAL #3   Title Pt will be able to perform water activities without pain   Time 6   Period Weeks   Status On-going               Plan - 05/14/15 1001    Clinical Impression Statement The patient reports therapy is helping especially ultrasound for the "knot" on her right buttock.  She was instructed in a progression of abdominal core strengthening, neural flossing and low level LE strengthening on the Nu-Step with mild, intermittent reports of lower leg pain with standing and some buttock pain of Nu-Step which quickly resolved with change of position.  She is receptive to possible dry needling for myofascial pain in right buttock and will consider this in the future in symptoms persist.  Therapist closely monitoring pain throughout treatment session and modifying as needed.     PT Next Visit Plan assess response to ab brace progression, neural flossing and Nu-Step; hip joint mobs; ?dry needling piriformis and gluteals        Problem List Patient Active Problem List   Diagnosis Date Noted  . Right lumbar radiculitis 04/18/2015  . Spinal stenosis of lumbar region with radiculopathy 04/18/2015  . Tinnitus aurium 02/21/2015  . Osteopenia, senile 02/20/2015  . BPPV (benign paroxysmal positional vertigo) 01/23/2015  . Obesity (BMI 30.0-34.9) 03/22/2013  . RLS (restless legs syndrome) 04/18/2012  . Routine general medical examination at a health care facility 10/19/2011  . Thyroid nodule   . Hyperglycemia 07/05/2009  . Hyperlipidemia with target LDL less than 130 12/11/2008  . Iron deficiency anemia 05/03/2008  . DEPRESSION/ANXIETY 05/03/2008  . Essential hypertension 05/03/2008  . Asthma 05/03/2008  . GERD  05/03/2008  . DEGENERATIVE JOINT DISEASE 05/03/2008  . OSA (obstructive sleep apnea) 05/03/2008    Alvera Singh 05/14/2015, 10:10 AM  [facility]  [place], [place], [address] Phone: [phone]   Fax:  [phone]   Ruben Im, PT 05/14/2015 10:10 AM Phone: [phone] Fax: [phone]

## 2015-05-17 ENCOUNTER — Encounter: Payer: Self-pay | Admitting: Internal Medicine

## 2015-05-17 ENCOUNTER — Ambulatory Visit: Payer: Medicare Other | Admitting: Physical Therapy

## 2015-05-17 DIAGNOSIS — R531 Weakness: Secondary | ICD-10-CM | POA: Diagnosis not present

## 2015-05-17 DIAGNOSIS — M5441 Lumbago with sciatica, right side: Secondary | ICD-10-CM

## 2015-05-17 DIAGNOSIS — Z23 Encounter for immunization: Secondary | ICD-10-CM | POA: Diagnosis not present

## 2015-05-17 NOTE — Therapy (Signed)
[place], [place], [address] Phone: [phone]   Fax:  [phone]  Physical Therapy Treatment  Patient Details  Name: Kayla Khan MRN: [id number] Date of Birth: 1951/07/16 Referring Provider:  Janith Lima, MD  Encounter Date: 05/17/2015      PT End of Session - 05/17/15 1129    Visit Number 5   Number of Visits 12   Date for PT Re-Evaluation 06/07/15   Authorization Type [employer]   PT Start Time 0845   PT Stop Time 0938   PT Time Calculation (min) 53 min   Activity Tolerance Patient tolerated treatment well      Past Medical History  Diagnosis Date  . 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 Diverticulosis of colon   . HLD (hyperlipidemia)   . Allergic conjunctivitis   . Fatigue   . Angina pectoris   . Thyroid nodule     The patient had a thyroid nodule that was surgically removed in the past.  She has one half of her thyroid and does not take replacement of Taxol and he was benign  . Diverticulosis of colon   . Sleep apnea   . DJD (degenerative joint disease)   . Depression with anxiety   . Anemia, iron deficiency   . GERD (gastroesophageal reflux disease)   . Palpitations   . Arthritis   . Asthma   . HLA B27 positive   . Ejection fraction     70%, catheter 2004 / normal LV function nuclear, 2008 ( no echo data as of December 23, 2010)    Past Surgical History  Procedure Laterality Date  . Vesicovaginal fistula closure w/ tah    . Breast biopsy    . Tubal ligation    . Appendectomy    . Cesarean section      x2  . Ganglion cyst excision    . Thyroidectomy, partial      left    There were no vitals filed for this visit.  Visit Diagnosis:  Right-sided low back pain with right-sided sciatica  Weakness      Subjective Assessment - 05/17/15 0854    Subjective (p) I overdid it on  Tuesday; After PT, I stood for 2 hours peeling tomatoes, water walking and numerous other activities.  Fired up yesterday.  The hip is better this morning.     Currently in Pain? (p) Yes   Aggravating Factors  (p) bending over, standing   Pain Relieving Factors (p) sitting                         [facility] Adult PT Treatment/Exercise - 05/17/15 0001    Lumbar Exercises: Seated   Other Seated Lumbar Exercises Neural flossing 15x on tall table 15x R/L   Lumbar Exercises: Supine   Ab Set 5 reps   Bent Knee Raise 5 reps   Straight Leg Raise 5 reps   Modalities   Modalities Moist Heat   Moist Heat Therapy   Number Minutes Moist Heat 10 Minutes   Moist Heat Location Hip   Manual Therapy   Manual Therapy Soft tissue mobilization;Joint mobilization;Muscle Energy Technique   Joint Mobilization Right hip joint mobs grade 3 3x 20 sec each:  long axis distraction; inferior with belt, PA in IR; subtalar ankle distraction 3x   Soft tissue  mobilization Right gluteals, piriformis, TFL and vastus lateralis, HS   Muscle Energy Technique piriformis contract/relax 3x 5 sec          Trigger Point Dry Needling - 05/17/15 1127    Consent Given? Yes   Education Handout Provided Yes   Muscles Treated Lower Body Gluteus maximus;Piriformis;Quadriceps;Tensor fascia lata   Gluteus Maximus Response Palpable increased muscle length   Piriformis Response Palpable increased muscle length   Tensor Fascia Lata Response Twitch response elicited;Palpable increased muscle length   Quadriceps Response Twitch response elicited;Palpable increased muscle length      Right only        PT Education - 05/17/15 1128    Education provided Yes   Education Details neural flossing in sitting; dry needling post care   Person(s) Educated Patient   Methods Explanation;Demonstration;Handout   Comprehension Verbalized understanding;Returned demonstration          PT Short Term Goals - 05/17/15 1135     PT SHORT TERM GOAL #1   Title Pt will be I with initial HEP   Status Achieved   PT SHORT TERM GOAL #2   Title Pt will report gait x 30 min with </= 2/10 pain to shop   Time 2   Period Weeks   Status On-going   PT SHORT TERM GOAL #3   Title Pt will be able to R sidelye with 50% less difficulty for sleeping   Time 2   Period Weeks   Status On-going   PT SHORT TERM GOAL #4   Title Pt will be able to lie supine with 50% less difficulty for resting   Time 2   Period Weeks   Status On-going           PT Long Term Goals - 05/17/15 1135    PT LONG TERM GOAL #1   Title Pt will be able to tolerate performing housework with proper body mechanics with 75% less difficulty   Time 6   Period Weeks   Status On-going   PT LONG TERM GOAL #2   Title Pt will report overall LE radicular sxs R to have decreased >/= 50% with functional mobility   Time 6   Period Weeks   Status On-going   PT LONG TERM GOAL #3   Title Pt will be able to perform water activities without pain   Time 6   Period Weeks   Status On-going               Plan - 05/17/15 1130    Clinical Impression Statement The patient reports a flare up from overdoing it Tuesday although after therapy she went home and stood for 2 hours making tomato sauce, walked and did numerous other activities in addition to PT.  She has numerous tender points in right gluteals, piriformis, TFL and vastus lateralis.  Improved muscle length following dry needling and manual therapy.  Immediately following session, patient states "I feel fine."  Corrected technique with abdominal brace with leg movements and seated neural flossing.   PT Next Visit Plan assess response to dry needling and continue if beneficial;  continue ab brace progression, gluteal strengthening; Nu-Step        Problem List Patient Active Problem List   Diagnosis Date Noted  . Right lumbar radiculitis 04/18/2015  . Spinal stenosis of lumbar region with radiculopathy  04/18/2015  . Tinnitus aurium 02/21/2015  . Osteopenia, senile 02/20/2015  . BPPV (benign paroxysmal positional vertigo) 01/23/2015  .  Obesity (BMI 30.0-34.9) 03/22/2013  . RLS (restless legs syndrome) 04/18/2012  . Routine general medical examination at a health care facility 10/19/2011  . Thyroid nodule   . Hyperglycemia 07/05/2009  . Hyperlipidemia with target LDL less than 130 12/11/2008  . Iron deficiency anemia 05/03/2008  . DEPRESSION/ANXIETY 05/03/2008  . Essential hypertension 05/03/2008  . Asthma 05/03/2008  . GERD 05/03/2008  . DEGENERATIVE JOINT DISEASE 05/03/2008  . OSA (obstructive sleep apnea) 05/03/2008    Alvera Singh 05/17/2015, 11:37 AM  [facility] [address] [place], [place], [address] Phone: [phone]   Fax:  [phone]  Ruben Im, PT 05/17/2015 11:37 AM Phone: [phone] Fax: [phone]

## 2015-05-17 NOTE — Patient Instructions (Addendum)
Handout on seated neural flossing 10x 3x/day Right and left  Trigger Point Dry Needling  . What is Trigger Point Dry Needling (DN)? o DN is a physical therapy technique used to treat muscle pain and dysfunction. Specifically, DN helps deactivate muscle trigger points (muscle knots).  o A thin filiform needle is used to penetrate the skin and stimulate the underlying trigger point. The goal is for a local twitch response (LTR) to occur and for the trigger point to relax. No medication of any kind is injected during the procedure.   . What Does Trigger Point Dry Needling Feel Like?  o The procedure feels different for each individual patient. Some patients report that they do not actually feel the needle enter the skin and overall the process is not painful. Very mild bleeding may occur. However, many patients feel a deep cramping in the muscle in which the needle was inserted. This is the local twitch response.   Marland Kitchen How Will I feel after the treatment? o Soreness is normal, and the onset of soreness may not occur for a few hours. Typically this soreness does not last longer than two days.  o Bruising is uncommon, however; ice can be used to decrease any possible bruising.  o In rare cases feeling tired or nauseous after the treatment is normal. In addition, your symptoms may get worse before they get better, this period will typically not last longer than 24 hours.   . What Can I do After My Treatment? o Increase your hydration by drinking more water for the next 24 hours. o You may place ice or heat on the areas treated that have become sore, however, do not use heat on inflamed or bruised areas. Heat often brings more relief post needling. o You can continue your regular activities, but vigorous activity is not recommended initially after the treatment for 24 hours. o DN is best combined with other physical therapy such as strengthening, stretching, and other therapies.

## 2015-05-21 ENCOUNTER — Ambulatory Visit: Payer: Medicare Other | Admitting: Physical Therapy

## 2015-05-21 DIAGNOSIS — R531 Weakness: Secondary | ICD-10-CM

## 2015-05-21 DIAGNOSIS — M5441 Lumbago with sciatica, right side: Secondary | ICD-10-CM | POA: Diagnosis not present

## 2015-05-21 NOTE — Therapy (Signed)
[place], [place], [address] Phone: [phone]   Fax:  [phone]  Physical Therapy Treatment  Patient Details  Name: Kayla Khan MRN: [id number] Date of Birth: [birth year] Referring Provider:  Janith Lima, MD  Encounter Date: 05/21/2015      PT End of Session - 05/21/15 0937    Visit Number 6   Number of Visits 12   Date for PT Re-Evaluation 06/07/15   Authorization Type BCBS   PT Start Time 4709   PT Stop Time 0938   PT Time Calculation (min) 51 min   Activity Tolerance Patient tolerated treatment well      Past Medical History  Diagnosis Date  . 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 Diverticulosis of colon   . HLD (hyperlipidemia)   . Allergic conjunctivitis   . Fatigue   . Angina pectoris   . Thyroid nodule     The patient had a thyroid nodule that was surgically removed in the past.  She has one half of her thyroid and does not take replacement of Taxol and he was benign  . Diverticulosis of colon   . Sleep apnea   . DJD (degenerative joint disease)   . Depression with anxiety   . Anemia, iron deficiency   . GERD (gastroesophageal reflux disease)   . Palpitations   . Arthritis   . Asthma   . HLA B27 positive   . Ejection fraction     70%, catheter 2004 / normal LV function nuclear, 2008 ( no echo data as of December 23, 2010)    Past Surgical History  Procedure Laterality Date  . Vesicovaginal fistula closure w/ tah    . Breast biopsy    . Tubal ligation    . Appendectomy    . Cesarean section      x2  . Ganglion cyst excision    . Thyroidectomy, partial      left    There were no vitals filed for this visit.  Visit Diagnosis:  Right-sided low back pain with right-sided sciatica  Weakness      Subjective Assessment - 05/21/15 0849    Subjective No pain during during  dry needling but "I felt like I had been kicked by a [place]."  Lasted 24-48 hours.  One spot in right buttock.     Currently in Pain? Yes   Pain Score 0-No pain   Pain Location Buttocks   Pain Orientation Right   Pain Type Chronic pain   Pain Onset More than a month ago   Pain Frequency Intermittent   Aggravating Factors  standing on my feet for a period of time; end of the day.   Pain Relieving Factors Am is good;                         [facility] Adult PT Treatment/Exercise - 05/21/15 0934    Lumbar Exercises: Standing   Other Standing Lumbar Exercises hip abduction 10x R/L; hip extension red band 10x; 4 inch lateral step up 10x   Lumbar Exercises: Supine   Bent Knee Raise 5 reps   Other Supine Lumbar Exercises Review of previous HEP   Moist Heat Therapy   Number Minutes Moist Heat 10 Minutes   Moist Heat Location Hip   Manual Therapy   Manual Therapy Soft tissue  mobilization;Joint mobilization   Joint Mobilization Right hip joint mobs grade 3 3x 20 sec each:  long axis distraction; inferior with belt, PA in IR; subtalar ankle distraction 3x   Soft tissue mobilization Right gluteals, piriformis, TFL and vastus lateralis, HS   Muscle Energy Technique piriformis contract/relax 3x 5 sec          Trigger Point Dry Needling - 05/21/15 0935    Consent Given? Yes   Education Handout Provided No   Muscles Treated Lower Body Gluteus maximus;Piriformis;Gluteus minimus   Gluteus Maximus Response Palpable increased muscle length   Gluteus Minimus Response Palpable increased muscle length   Piriformis Response Palpable increased muscle length       Right side only       PT Education - 05/21/15 0937    Education provided Yes   Education Details standing gluteal strengthening exercises   Person(s) Educated Patient   Methods Explanation;Demonstration;Handout   Comprehension Verbalized understanding;Returned demonstration          PT Short Term Goals - 05/21/15  0943    PT SHORT TERM GOAL #1   Title Pt will be I with initial HEP   Status Achieved   PT SHORT TERM GOAL #2   Title Pt will report gait x 30 min with </= 2/10 pain to shop   Time 2   Period Weeks   Status On-going   PT SHORT TERM GOAL #3   Title Pt will be able to R sidelye with 50% less difficulty for sleeping   Time 2   Period Weeks   Status On-going   PT SHORT TERM GOAL #4   Title Pt will be able to lie supine with 50% less difficulty for resting   Time 2   Period Weeks   Status On-going           PT Long Term Goals - 05/21/15 0944    PT LONG TERM GOAL #1   Title Pt will be able to tolerate performing housework with proper body mechanics with 75% less difficulty   Time 6   Period Weeks   Status On-going   PT LONG TERM GOAL #2   Title Pt will report overall LE radicular sxs R to have decreased >/= 50% with functional mobility   Time 6   Period Weeks   Status On-going   PT LONG TERM GOAL #3   Title Pt will be able to perform water activities without pain   Time 6   Period Weeks   Status On-going               Plan - 05/21/15 9373    Clinical Impression Statement The patient reports doing well following first dry needling session and didn't mind the delayed onset soreness fro 24-48 hours.  The "spot" is in buttock region in piriformis region but with tender points in gluteals as well.  Verbal cues for proper technique with gluteal and abdominal strengthening exercises.  Patient is receptive to education and focuses on performing with proper technique.     PT Next Visit Plan assess response to dry needling and continue if beneficial;  continue ab brace progression, gluteal strengthening; Nu-Step; check progress toward goals        Problem List Patient Active Problem List   Diagnosis Date Noted  . Right lumbar radiculitis 04/18/2015  . Spinal stenosis of lumbar region with radiculopathy 04/18/2015  . Tinnitus aurium 02/21/2015  . Osteopenia, senile  02/20/2015  . BPPV (benign  paroxysmal positional vertigo) 01/23/2015  . Obesity (BMI 30.0-34.9) 03/22/2013  . RLS (restless legs syndrome) 04/18/2012  . Routine general medical examination at a health care facility 10/19/2011  . Thyroid nodule   . Hyperglycemia 07/05/2009  . Hyperlipidemia with target LDL less than 130 12/11/2008  . Iron deficiency anemia 05/03/2008  . DEPRESSION/ANXIETY 05/03/2008  . Essential hypertension 05/03/2008  . Asthma 05/03/2008  . GERD 05/03/2008  . DEGENERATIVE JOINT DISEASE 05/03/2008  . OSA (obstructive sleep apnea) 05/03/2008    Alvera Singh 05/21/2015, 9:45 AM  [facility] [address] [place], [place], [address] Phone: [phone]   Fax:  [phone]   Ruben Im, PT 05/21/2015 9:45 AM Phone: [phone] Fax: [phone]

## 2015-05-24 ENCOUNTER — Ambulatory Visit: Payer: Medicare Other | Admitting: Physical Therapy

## 2015-05-24 DIAGNOSIS — R531 Weakness: Secondary | ICD-10-CM | POA: Diagnosis not present

## 2015-05-24 DIAGNOSIS — M5441 Lumbago with sciatica, right side: Secondary | ICD-10-CM | POA: Diagnosis not present

## 2015-05-24 NOTE — Patient Instructions (Signed)
Hip Abduction: Side-Lying (Single Leg)   Lie on side with knees bent, tubing around thighs just above knees. Raise top leg, keeping knee bent. Repeat _10_ times per set. Repeat on other side. Do 2__ sets per session. Do _2_ sessions per week.  http://tub.exer.us/44   Copyright  VHI. All rights reserved.  Abduction: Side Leg Lift (Eccentric) - Side-Lying   Lie on side. Lift top leg slightly higher than shoulder level. Keep top leg straight with body, toes pointing forward. Slowly lower for 3-5 seconds. _10__ reps per set, __2_ sets per day, __7_ days per week. Add ___ lbs when you achieve ___ repetitions.  Copyright  VHI. All rights reserved.  Hip Extension (Prone)   Lift left leg __6__ inches from floor, keeping knee locked. KEEP ABS TIGHT Repeat __10__ times per set. Do _2___ sets per session. Do _2___ sessions per day.  http://orth.exer.us/99   Copyright  VHI. All rights reserved.

## 2015-05-24 NOTE — Therapy (Signed)
[place], [place], [address] Phone: [phone]   Fax:  [phone]  Physical Therapy Treatment  Patient Details  Name: Kayla Khan MRN: [id number] Date of Birth: Feb 04, 1951 Referring Provider:  Janith Lima, MD  Encounter Date: 05/24/2015      PT End of Session - 05/24/15 0905    Visit Number 7   Number of Visits 12   Date for PT Re-Evaluation 06/07/15   Authorization Type BCBS   PT Start Time 0850   PT Stop Time 0945   PT Time Calculation (min) 55 min      Past Medical History  Diagnosis Date  . 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 Diverticulosis of colon   . HLD (hyperlipidemia)   . Allergic conjunctivitis   . Fatigue   . Angina pectoris   . Thyroid nodule     The patient had a thyroid nodule that was surgically removed in the past.  She has one half of her thyroid and does not take replacement of Taxol and he was benign  . Diverticulosis of colon   . Sleep apnea   . DJD (degenerative joint disease)   . Depression with anxiety   . Anemia, iron deficiency   . GERD (gastroesophageal reflux disease)   . Palpitations   . Arthritis   . Asthma   . HLA B27 positive   . Ejection fraction     70%, catheter 2004 / normal LV function nuclear, 2008 ( no echo data as of December 23, 2010)    Past Surgical History  Procedure Laterality Date  . Vesicovaginal fistula closure w/ tah    . Breast biopsy    . Tubal ligation    . Appendectomy    . Cesarean section      x2  . Ganglion cyst excision    . Thyroidectomy, partial      left    There were no vitals filed for this visit.  Visit Diagnosis:  Right-sided low back pain with right-sided sciatica  Weakness      Subjective Assessment - 05/24/15 0855    Subjective The dry needling helped a whole lot.    Currently in Pain? No/denies  a  little twinge r lateral hip   Aggravating Factors  standing too long                         [facility] Adult PT Treatment/Exercise - 05/24/15 0001    Self-Care   Self-Care ADL's;Lifting   ADL's Handout reviewed with pt   Lifting Pt returned demonstration of proper lifting    Lumbar Exercises: Standing   Other Standing Lumbar Exercises hip extension standing with red band- pt given band and shown how to knot in door. Review of standing hip abduction- pt reports increased leg pain. Instructed in Posterior oelvic tilt which stopped leg pain.    Lumbar Exercises: Supine   Bent Knee Raise 10 reps   Bent Knee Raise Limitations level 2   Straight Leg Raise 10 reps  leg pain   Straight Leg Raises Limitations cues to perform pelvic tilt- no leg pain    Lumbar Exercises: Sidelying   Clam 20 reps   Hip Abduction 10 reps   Lumbar Exercises: Prone   Straight Leg Raise 10 reps   Straight Leg Raises Limitations alternating with a  set                PT Education - 05/24/15 0941    Education provided Yes   Education Details Clam , Side lying abduction, prone hip extension   Person(s) Educated Patient   Methods Explanation;Handout   Comprehension Verbalized understanding          PT Short Term Goals - 05/24/15 0856    PT SHORT TERM GOAL #1   Title Pt will be I with initial HEP   Time 2   Period Weeks   Status Achieved   PT SHORT TERM GOAL #2   Title Pt will report gait x 30 min with </= 2/10 pain to shop   Time 2   Period Weeks   Status Unable to assess   PT SHORT TERM GOAL #3   Title Pt will be able to R sidelye with 50% less difficulty for sleeping   Time 2   Period Weeks   Status Achieved   PT SHORT TERM GOAL #4   Title Pt will be able to lie supine with 50% less difficulty for resting   Time 2   Period Weeks   Status Achieved           PT Long Term Goals - 05/24/15 4585    PT LONG TERM GOAL #1   Title Pt will be able to tolerate performing  housework with proper body mechanics with 75% less difficulty   Time 6   Period Weeks   Status On-going  25%   PT LONG TERM GOAL #2   Title Pt will report overall LE radicular sxs R to have decreased >/= 50% with functional mobility   Time 6   Period Weeks   Status On-going   PT LONG TERM GOAL #3   Title Pt will be able to perform water activities without pain   Time 6   Period Weeks   Status On-going  going to the beach this weekend               Plan - 05/24/15 0857    Clinical Impression Statement Pt reports 50% decrease in pain with lying supine and right sidelying. STG #3, #4 achieved. Reviewed pt's new core exercises and performed self care -Body Mechanics handouts reviewed and demonstrated with pt. Pt able to return demonstrate correct lifting mechanics. Instructed in gluteal strengtheing supine and sidelying on mat table. Pt reports no increased pain and would like to add to HEP. Instructed pt to perform only 1 set a day to assess response.     PT Next Visit Plan assess response to HEP; dry needling as needed,  continue ab brace progression, gluteal strengthening; Nu-Step; check progress toward goals        Problem List Patient Active Problem List   Diagnosis Date Noted  . Right lumbar radiculitis 04/18/2015  . Spinal stenosis of lumbar region with radiculopathy 04/18/2015  . Tinnitus aurium 02/21/2015  . Osteopenia, senile 02/20/2015  . BPPV (benign paroxysmal positional vertigo) 01/23/2015  . Obesity (BMI 30.0-34.9) 03/22/2013  . RLS (restless legs syndrome) 04/18/2012  . Routine general medical examination at a health care facility 10/19/2011  . Thyroid nodule   . Hyperglycemia 07/05/2009  . Hyperlipidemia with target LDL less than 130 12/11/2008  . Iron deficiency anemia 05/03/2008  . DEPRESSION/ANXIETY 05/03/2008  . Essential hypertension 05/03/2008  . Asthma 05/03/2008  . GERD 05/03/2008  . DEGENERATIVE JOINT DISEASE 05/03/2008  . OSA (obstructive  sleep  apnea) 05/03/2008    Dorene Ar, PTA 05/24/2015, 9:56 AM  [facility] [address] [place], [place], [address] Phone: [phone]   Fax:  [phone]

## 2015-05-28 ENCOUNTER — Ambulatory Visit: Payer: Medicare Other | Admitting: Physical Therapy

## 2015-05-28 DIAGNOSIS — R531 Weakness: Secondary | ICD-10-CM | POA: Diagnosis not present

## 2015-05-28 DIAGNOSIS — M5441 Lumbago with sciatica, right side: Secondary | ICD-10-CM | POA: Diagnosis not present

## 2015-05-28 NOTE — Therapy (Addendum)
[place], [place], [address] Phone: [phone]   Fax:  [phone]  Physical Therapy Treatment  Patient Details  Name: Kayla Khan MRN: [id number] Date of Birth: 09-26-1950 Referring Provider:  Janith Lima, MD  Encounter Date: 05/28/2015      PT End of Session - 05/28/15 0900    Visit Number 8   Number of Visits 12   Date for PT Re-Evaluation 06/07/15   Authorization Type BCBS   PT Start Time 8921   PT Stop Time 0931   PT Time Calculation (min) 38 min      Past Medical History  Diagnosis Date  . 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 Diverticulosis of colon   . HLD (hyperlipidemia)   . Allergic conjunctivitis   . Fatigue   . Angina pectoris   . Thyroid nodule     The patient had a thyroid nodule that was surgically removed in the past.  She has one half of her thyroid and does not take replacement of Taxol and he was benign  . Diverticulosis of colon   . Sleep apnea   . DJD (degenerative joint disease)   . Depression with anxiety   . Anemia, iron deficiency   . GERD (gastroesophageal reflux disease)   . Palpitations   . Arthritis   . Asthma   . HLA B27 positive   . Ejection fraction     70%, catheter 2004 / normal LV function nuclear, 2008 ( no echo data as of December 23, 2010)    Past Surgical History  Procedure Laterality Date  . Vesicovaginal fistula closure w/ tah    . Breast biopsy    . Tubal ligation    . Appendectomy    . Cesarean section      x2  . Ganglion cyst excision    . Thyroidectomy, partial      left    There were no vitals filed for this visit.  Visit Diagnosis:  Right-sided low back pain with right-sided sciatica  Weakness      Subjective Assessment - 05/28/15 0900    Subjective I felt uncomfortable in the waves and walking in the sand. I did not feel like  I could go as far out.    Currently in Pain? No/denies                         [facility] Adult PT Treatment/Exercise - 05/28/15 0908    Lumbar Exercises: Aerobic   Stationary Bike Nustep L4 UE/LE x 5 min   Lumbar Exercises: Supine   Clam 20 reps   Clam Limitations neutral spine   Heel Slides 20 reps   Heel Slides Limitations neutral spine   Bent Knee Raise 15 reps   Bent Knee Raise Limitations level 2, level 3    Straight Leg Raise --  12 reps   Other Supine Lumbar Exercises Dead bug, level 3 scissors x 10   Lumbar Exercises: Sidelying   Clam 20 reps  bilateral   Clam Limitations with ab set  red band   Hip Abduction 10 reps  bilateral   Hip Abduction Limitations with ab set   Lumbar Exercises: Prone   Straight Leg Raise 10 reps   Straight Leg Raises Limitations each side  pillow under hips   Other Prone Lumbar Exercises donkey  kicks x 10 each side, bilateral h/s curls x 10 with Tra contract.  pillow under hips                PT Education - 05/28/15 1047    Education provided Yes   Education Details Prone pelvic press progression lying over pillows, red band for clams, dead bug          PT Short Term Goals - 05/28/15 0903    PT SHORT TERM GOAL #1   Title Pt will be I with initial HEP   Time 2   Period Weeks   Status Achieved   PT SHORT TERM GOAL #2   Title Pt will report gait x 30 min with </= 2/10 pain to shop   Time 2   Period Weeks   Status On-going  4/10 after 15 minutes hip and leg   PT SHORT TERM GOAL #3   Title Pt will be able to R sidelye with 50% less difficulty for sleeping   Time 2   Period Weeks   Status Achieved   PT SHORT TERM GOAL #4   Title Pt will be able to lie supine with 50% less difficulty for resting   Time 2   Period Weeks   Status Achieved           PT Long Term Goals - 05/24/15 8315    PT LONG TERM GOAL #1   Title Pt will be able to tolerate performing housework with proper body mechanics with 75%  less difficulty   Time 6   Period Weeks   Status On-going  25%   PT LONG TERM GOAL #2   Title Pt will report overall LE radicular sxs R to have decreased >/= 50% with functional mobility   Time 6   Period Weeks   Status On-going   PT LONG TERM GOAL #3   Title Pt will be able to perform water activities without pain   Time 6   Period Weeks   Status On-going  going to the beach this weekend               Plan - 05/28/15 0905    Clinical Impression Statement Pt reports water activities at the beach increased her leg pain. She is able to grocerry shop for 30 minutes if she has a cart however shopping in retail stores causes increased hip/leg pain to 4/10.Progresing toward goals. Progressed pt to dead bug and prone hip extension strengthening for HEP. Pt demonstrates hip extension weakness in prone.    PT Next Visit Plan Check MMT, review dead bug and prone pelvic press exercises with pillows to support neutral spine.         Problem List Patient Active Problem List   Diagnosis Date Noted  . Right lumbar radiculitis 04/18/2015  . Spinal stenosis of lumbar region with radiculopathy 04/18/2015  . Tinnitus aurium 02/21/2015  . Osteopenia, senile 02/20/2015  . BPPV (benign paroxysmal positional vertigo) 01/23/2015  . Obesity (BMI 30.0-34.9) 03/22/2013  . RLS (restless legs syndrome) 04/18/2012  . Routine general medical examination at a health care facility 10/19/2011  . Thyroid nodule   . Hyperglycemia 07/05/2009  . Hyperlipidemia with target LDL less than 130 12/11/2008  . Iron deficiency anemia 05/03/2008  . DEPRESSION/ANXIETY 05/03/2008  . Essential hypertension 05/03/2008  . Asthma 05/03/2008  . GERD 05/03/2008  . DEGENERATIVE JOINT DISEASE 05/03/2008  . OSA (obstructive sleep apnea) 05/03/2008    Dorene Ar, PTA 05/28/2015,  10:49 AM  [facility] [address] [place], [place], [address] Phone:  [phone]   Fax:  [phone]

## 2015-05-28 NOTE — Patient Instructions (Addendum)
Lower Abdominals Knee Down / Up, Alternating   Start with both legs up. Lower one leg. Return. Lower other leg. Return. Keep pelvis still. Do _10__ times, alternating legs. Do _2__ sets, __2_ times per day. ADD ALTERNATING ARMS http://ss.exer.us/31   Copyright  VHI. All rights reserved.  Prone Pelvic press with ab set over 1-2 pillows Bend both knees while holding position x 10 Lift one leg x 10 , repeat on other side Bend knee and left thigh  x10 , repeat on other side All exercises performed with abdominals drawn in to spine.

## 2015-05-29 ENCOUNTER — Other Ambulatory Visit: Payer: Self-pay

## 2015-05-29 MED ORDER — PAROXETINE HCL ER 25 MG PO TB24: 25.0000 mg | ORAL_TABLET | Freq: Every day | ORAL | Status: DC

## 2015-05-29 NOTE — Telephone Encounter (Signed)
Ok to rf? 

## 2015-05-31 ENCOUNTER — Ambulatory Visit: Payer: Medicare Other | Admitting: Physical Therapy

## 2015-05-31 DIAGNOSIS — M5441 Lumbago with sciatica, right side: Secondary | ICD-10-CM | POA: Diagnosis not present

## 2015-05-31 DIAGNOSIS — R531 Weakness: Secondary | ICD-10-CM

## 2015-05-31 NOTE — Therapy (Signed)
[place], [place], [address] Phone: [phone]   Fax:  [phone]  Physical Therapy Treatment  Patient Details  Name: Kayla Khan MRN: [id number] Date of Birth: [birth year] Referring Provider:  Janith Lima, MD  Encounter Date: 05/31/2015      PT End of Session - 05/31/15 0853    Visit Number 9   Number of Visits 12   Date for PT Re-Evaluation 06/07/15   Authorization Type BCBS   PT Start Time 0848   PT Stop Time 0930   PT Time Calculation (min) 42 min      Past Medical History  Diagnosis Date  . 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 Diverticulosis of colon   . HLD (hyperlipidemia)   . Allergic conjunctivitis   . Fatigue   . Angina pectoris   . Thyroid nodule     The patient had a thyroid nodule that was surgically removed in the past.  She has one half of her thyroid and does not take replacement of Taxol and he was benign  . Diverticulosis of colon   . Sleep apnea   . DJD (degenerative joint disease)   . Depression with anxiety   . Anemia, iron deficiency   . GERD (gastroesophageal reflux disease)   . Palpitations   . Arthritis   . Asthma   . HLA B27 positive   . Ejection fraction     70%, catheter 2004 / normal LV function nuclear, 2008 ( no echo data as of December 23, 2010)    Past Surgical History  Procedure Laterality Date  . Vesicovaginal fistula closure w/ tah    . Breast biopsy    . Tubal ligation    . Appendectomy    . Cesarean section      x2  . Ganglion cyst excision    . Thyroidectomy, partial      left    There were no vitals filed for this visit.  Visit Diagnosis:  Right-sided low back pain with right-sided sciatica  Weakness      Subjective Assessment - 05/31/15 0852    Subjective I am doing really well. Two days ago was my best day so far.    Currently in  Pain? No/denies            [facility] PT Assessment - 05/31/15 0858    Strength   Right Hip Flexion 4-/5   Right Hip Extension 4/5   Right Hip ABduction 4+/5   Right Hip ADduction 4/5   Left Hip Flexion 4-/5   Left Hip Extension 4/5   Left Hip ABduction 4+/5   Left Hip ADduction 4+/5                     [facility] Adult PT Treatment/Exercise - 05/31/15 0918    Lumbar Exercises: Aerobic   Stationary Bike Nustep L5 UE/LE x 5 min   Lumbar Exercises: Supine   Clam 20 reps   Clam Limitations neutral spine   Heel Slides 20 reps   Heel Slides Limitations neutral spine   Bent Knee Raise 15 reps   Bent Knee Raise Limitations level 2, level 3    Straight Leg Raise --  12 reps   Other Supine Lumbar Exercises Dead bug, level 3 scissors x 10   Other Supine Lumbar Exercises Pilates Reformer- intoduced reformer concepts using  foot work 2 red 1 yellow for parallel on heels, toes and ER on heels x 10 each- discussed Pilates in the community for continued core strength and flexibility. Pt receptive   Lumbar Exercises: Sidelying   Hip Abduction 20 reps  bilateral   Hip Abduction Limitations with ab set added 2 #- increased LBP with RLE exercises    Lumbar Exercises: Prone   Straight Leg Raise 10 reps   Straight Leg Raises Limitations each side 2#  pillow under hips   Other Prone Lumbar Exercises donkey kick 10 x 2 2# -some LBP with RLE exercises                   PT Short Term Goals - 05/28/15 0277    PT SHORT TERM GOAL #1   Title Pt will be I with initial HEP   Time 2   Period Weeks   Status Achieved   PT SHORT TERM GOAL #2   Title Pt will report gait x 30 min with </= 2/10 pain to shop   Time 2   Period Weeks   Status On-going  4/10 after 15 minutes hip and leg   PT SHORT TERM GOAL #3   Title Pt will be able to R sidelye with 50% less difficulty for sleeping   Time 2   Period Weeks   Status Achieved   PT SHORT TERM GOAL #4   Title Pt will be able to lie  supine with 50% less difficulty for resting   Time 2   Period Weeks   Status Achieved           PT Long Term Goals - 05/24/15 4128    PT LONG TERM GOAL #1   Title Pt will be able to tolerate performing housework with proper body mechanics with 75% less difficulty   Time 6   Period Weeks   Status On-going  25%   PT LONG TERM GOAL #2   Title Pt will report overall LE radicular sxs R to have decreased >/= 50% with functional mobility   Time 6   Period Weeks   Status On-going   PT LONG TERM GOAL #3   Title Pt will be able to perform water activities without pain   Time 6   Period Weeks   Status On-going  going to the beach this weekend               Plan - 05/31/15 7867    Clinical Impression Statement Pt reports a nearly pain free day this week. The only aggravation is standing in one place. She has some intermitent "buzzing" feeling lateral lower right leg.    PT Next Visit Plan continue core, 2 more visist, encourage pilates in community, reformer again if pt likes, recheck goals        Problem List Patient Active Problem List   Diagnosis Date Noted  . Right lumbar radiculitis 04/18/2015  . Spinal stenosis of lumbar region with radiculopathy 04/18/2015  . Tinnitus aurium 02/21/2015  . Osteopenia, senile 02/20/2015  . BPPV (benign paroxysmal positional vertigo) 01/23/2015  . Obesity (BMI 30.0-34.9) 03/22/2013  . RLS (restless legs syndrome) 04/18/2012  . Routine general medical examination at a health care facility 10/19/2011  . Thyroid nodule   . Hyperglycemia 07/05/2009  . Hyperlipidemia with target LDL less than 130 12/11/2008  . Iron deficiency anemia 05/03/2008  . DEPRESSION/ANXIETY 05/03/2008  . Essential hypertension 05/03/2008  . Asthma 05/03/2008  . GERD 05/03/2008  .  DEGENERATIVE JOINT DISEASE 05/03/2008  . OSA (obstructive sleep apnea) 05/03/2008    Dorene Ar, PTA 05/31/2015, 10:18 AM  [place], [place], [address] Phone: [phone]   Fax:  [phone]

## 2015-06-04 ENCOUNTER — Ambulatory Visit: Payer: Medicare Other | Admitting: Physical Therapy

## 2015-06-04 DIAGNOSIS — R531 Weakness: Secondary | ICD-10-CM | POA: Diagnosis not present

## 2015-06-04 DIAGNOSIS — M5441 Lumbago with sciatica, right side: Secondary | ICD-10-CM

## 2015-06-04 NOTE — Therapy (Signed)
[place], [place], [address] Phone: [phone]   Fax:  [phone]  Physical Therapy Treatment  Patient Details  Name: Kayla Khan MRN: [id number] Date of Birth: [birth year] Referring Provider:  Janith Lima, MD  Encounter Date: 06/04/2015      PT End of Session - 06/04/15 1257    Visit Number 10   Number of Visits 12   Date for PT Re-Evaluation 06/07/15   Authorization Type BCBS   PT Start Time 0848   PT Stop Time 0926   PT Time Calculation (min) 38 min   Activity Tolerance Patient tolerated treatment well      Past Medical History  Diagnosis Date  . 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 Diverticulosis of colon   . HLD (hyperlipidemia)   . Allergic conjunctivitis   . Fatigue   . Angina pectoris   . Thyroid nodule     The patient had a thyroid nodule that was surgically removed in the past.  She has one half of her thyroid and does not take replacement of Taxol and he was benign  . Diverticulosis of colon   . Sleep apnea   . DJD (degenerative joint disease)   . Depression with anxiety   . Anemia, iron deficiency   . GERD (gastroesophageal reflux disease)   . Palpitations   . Arthritis   . Asthma   . HLA B27 positive   . Ejection fraction     70%, catheter 2004 / normal LV function nuclear, 2008 ( no echo data as of December 23, 2010)    Past Surgical History  Procedure Laterality Date  . Vesicovaginal fistula closure w/ tah    . Breast biopsy    . Tubal ligation    . Appendectomy    . Cesarean section      x2  . Ganglion cyst excision    . Thyroidectomy, partial      left    There were no vitals filed for this visit.  Visit Diagnosis:  Right-sided low back pain with right-sided sciatica  Weakness      Subjective Assessment - 06/04/15 0852    Subjective Going on vacation for 2  weeks and then will follow up with MD.  Will discharge next visit if majority of goals met.  A little twinge in my hip and some lower leg pain when I first got up this morning which is normal.     Currently in Pain? Yes   Pain Score 1    Pain Location Leg   Pain Orientation Right   Pain Type Chronic pain   Pain Radiating Towards right lateral calf   Aggravating Factors  standing in one place, worse end of the day.     Pain Relieving Factors moving around; heat; abdominal draw ins helps            [facility] PT Assessment - 06/04/15 0001    Strength   Right Hip Flexion 4-/5   Right Hip Extension 4/5   Right Hip ABduction 4+/5   Right Hip ADduction 4/5   Left Hip Flexion 4-/5   Left Hip Extension 4/5   Left Hip ABduction 4+/5   Left Hip ADduction 4+/5   Right Ankle Dorsiflexion 4-/5   Right Ankle Plantar Flexion 4/5   Right Ankle Inversion 4/5   Right Ankle Eversion 4+/5  Left Ankle Dorsiflexion 4/5   Left Ankle Plantar Flexion 4/5   Left Ankle Inversion 4/5   Left Ankle Eversion 4/5                     [facility] Adult PT Treatment/Exercise - 2015-06-08 0001    Lumbar Exercises: Aerobic   Stationary Bike Nu-Step L5 5 min   Tread Mill 5 min 2.2 mph   Lumbar Exercises: Standing   Other Standing Lumbar Exercises --  AROM and modified for right hip isometric abd and extension    Lumbar Exercises: Supine   Heel Slides 10 reps   Bent Knee Raise 10 reps   Bridge 10 reps                  PT Short Term Goals - 2015-06-08 0909    PT SHORT TERM GOAL #1   Title Pt will be I with initial HEP   Status Achieved   PT SHORT TERM GOAL #2   Title Pt will report gait x 30 min with </= 2/10 pain to shop   Baseline 1 mile Jun 08, 2015   Time 2   Period Weeks   Status Partially Met   PT SHORT TERM GOAL #3   Title Pt will be able to R sidelye with 50% less difficulty for sleeping   Status Achieved   PT SHORT TERM GOAL #4   Title Pt will be able to lie supine with 50% less  difficulty for resting   Status Achieved           PT Long Term Goals - 2015-06-08 0906    PT LONG TERM GOAL #1   Title Pt will be able to tolerate performing housework with proper body mechanics with 75% less difficulty   Time 6   Period Weeks   Status Achieved   PT LONG TERM GOAL #2   Title Pt will report overall LE radicular sxs R to have decreased >/= 50% with functional mobility   Baseline 50-75% better in leg;  hip 90% better   Time 6   Period Weeks   Status On-going   PT LONG TERM GOAL #3   Title Pt will be able to perform water activities without pain   Time 6   Period Weeks   Status On-going               Plan - 06/08/2015 1324    Clinical Impression Statement The patient continues to be highly compliant with HEP.  Patient requested to review HEP for proper technique.  Modified right hip abd and hip extension to isometrics secondary to pain with hip abd and extension actively.  Able to do 5 min on treadmill with initial distal lower leg pain which resolved as she continued to walk.  She is going out of town for 2 weeks to [place].  She reports she is signficantly better and is approaching readiness for discharge from PT next visit.  Her hip is 90% better and her lower leg is 50-75% better.     PT Next Visit Plan Do FOTO;  treadmill, review HEP as needed;  discharge from PT          G-Codes - 2015-06-08 1820    Functional Assessment Tool Used clinical judgment    Functional Limitation Mobility: Walking and moving around   Mobility: Walking and Moving Around Current Status ([id number]) At least 1 percent but less than 20 percent impaired, limited or restricted  Mobility: Walking and Moving Around Goal Status [phone]) At least 1 percent but less than 20 percent impaired, limited or restricted      Problem List Patient Active Problem List   Diagnosis Date Noted  . Right lumbar radiculitis 04/18/2015  . Spinal stenosis of lumbar region with radiculopathy 04/18/2015  .  Tinnitus aurium 02/21/2015  . Osteopenia, senile 02/20/2015  . BPPV (benign paroxysmal positional vertigo) 01/23/2015  . Obesity (BMI 30.0-34.9) 03/22/2013  . RLS (restless legs syndrome) 04/18/2012  . Routine general medical examination at a health care facility 10/19/2011  . Thyroid nodule   . Hyperglycemia 07/05/2009  . Hyperlipidemia with target LDL less than 130 12/11/2008  . Iron deficiency anemia 05/03/2008  . DEPRESSION/ANXIETY 05/03/2008  . Essential hypertension 05/03/2008  . Asthma 05/03/2008  . GERD 05/03/2008  . DEGENERATIVE JOINT DISEASE 05/03/2008  . OSA (obstructive sleep apnea) 05/03/2008    Alvera Singh 06/04/2015, 6:22 PM  [facility] [address] [place], [place], [address] Phone: [phone]   Fax:  [phone]     Physical Therapy Progress Note  Dates of Reporting Period: 04/21/15 to 06/04/15  Objective Reports of Subjective Statement: See clinical impressions above  Objective Measurements: See MMT above  Goal Update: Goals partially met Plan: See clinical impressions above  Reason Skilled Services are Required: see above  Ruben Im, PT 06/04/2015 6:24 PM Phone: [phone] Fax: [phone]

## 2015-06-07 ENCOUNTER — Ambulatory Visit: Payer: Medicare Other | Admitting: Physical Therapy

## 2015-06-07 DIAGNOSIS — M5441 Lumbago with sciatica, right side: Secondary | ICD-10-CM

## 2015-06-07 DIAGNOSIS — R531 Weakness: Secondary | ICD-10-CM | POA: Diagnosis not present

## 2015-06-07 NOTE — Therapy (Addendum)
[place], [place], [address] Phone: [phone]   Fax:  [phone]  Physical Therapy Treatment/Discharge Summary  Patient Details  Name: Kayla Khan MRN: [id number] Date of Birth: Jun 17, 1951 Referring Provider:  Janith Lima, MD  Encounter Date: 06/07/2015      PT End of Session - 06/07/15 0851    Visit Number 11   Number of Visits 12   Date for PT Re-Evaluation 06/07/15   Authorization Type BCBS   PT Start Time 0848   PT Stop Time 0928   PT Time Calculation (min) 40 min      Past Medical History  Diagnosis Date  . 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 Diverticulosis of colon   . HLD (hyperlipidemia)   . Allergic conjunctivitis   . Fatigue   . Angina pectoris   . Thyroid nodule     The patient had a thyroid nodule that was surgically removed in the past.  She has one half of her thyroid and does not take replacement of Taxol and he was benign  . Diverticulosis of colon   . Sleep apnea   . DJD (degenerative joint disease)   . Depression with anxiety   . Anemia, iron deficiency   . GERD (gastroesophageal reflux disease)   . Palpitations   . Arthritis   . Asthma   . HLA B27 positive   . Ejection fraction     70%, catheter 2004 / normal LV function nuclear, 2008 ( no echo data as of December 23, 2010)    Past Surgical History  Procedure Laterality Date  . Vesicovaginal fistula closure w/ tah    . Breast biopsy    . Tubal ligation    . Appendectomy    . Cesarean section      x2  . Ganglion cyst excision    . Thyroidectomy, partial      left    There were no vitals filed for this visit.  Visit Diagnosis:  Right-sided low back pain with right-sided sciatica  Weakness      Subjective Assessment - 06/07/15 0849    Subjective Woke up with 4/10 pain but it is wearing off.    Currently  in Pain? No/denies            [facility] PT Assessment - 06/07/15 0539    Observation/Other Assessments   Focus on Therapeutic Outcomes (FOTO)  35% limited improved from 50% limited on eval and 38% predicated                     [facility] Adult PT Treatment/Exercise - 06/07/15 0001    Lumbar Exercises: Stretches   Piriformis Stretch 2 reps;10 seconds   Lumbar Exercises: Aerobic   Stationary Bike Nu-Step L5 5 min   Elliptical Level 1 Ramp1 5 minutes x 2   Tread Mill 5 min 2.3 mph   UBE (Upper Arm Bike) Recumbent bike L1-2 x 5 minute monitoring HR for conditioning- HR up to 109 bpm   Lumbar Exercises: Supine   Bent Knee Raise 10 reps   Bent Knee Raise Limitations Scissors level 2,3    Other Supine Lumbar Exercises Dead bug, level 3 scissors x 10   Lumbar Exercises: Prone   Straight Leg Raise 10 reps   Straight Leg Raises Limitations each side 2#  pillow under hips   Other  Prone Lumbar Exercises donkey kick 10 x 2 2# -some LBP with RLE exercises                   PT Short Term Goals - 06/04/15 0909    PT SHORT TERM GOAL #1   Title Pt will be I with initial HEP   Status Achieved   PT SHORT TERM GOAL #2   Title Pt will report gait x 30 min with </= 2/10 pain to shop   Baseline 1 mile 06/04/15   Time 2   Period Weeks   Status Partially Met   PT SHORT TERM GOAL #3   Title Pt will be able to R sidelye with 50% less difficulty for sleeping   Status Achieved   PT SHORT TERM GOAL #4   Title Pt will be able to lie supine with 50% less difficulty for resting   Status Achieved           PT Long Term Goals - 06/07/15 4403    PT LONG TERM GOAL #1   Title Pt will be able to tolerate performing housework with proper body mechanics with 75% less difficulty   Time 6   Period Weeks   Status Achieved   PT LONG TERM GOAL #2   Title Pt will report overall LE radicular sxs R to have decreased >/= 50% with functional mobility   Time 6   Period Weeks   Status  Achieved   PT LONG TERM GOAL #3   Title Pt will be able to perform water activities without pain   Time 6   Period Weeks   Status Unable to assess               Plan - 06/07/15 0907    Clinical Impression Statement Pt presents with no pain however did expreience increased radicular symptoms this morning which have subsided. She is interested in doing treadmill and elliptical today to test he response. The elliptical did not cause any sx after 5 minutes. The treadmil did cause lower leg radicular sx after 5 minutes. The patient returned to the elliptical for another 5 minutes without radicaulr sx. HR 110 bpm. Educated pt on target HR zone of 92 to 123 bpm. Her HR ranged from 105 to 116 with aerobic exercise. today. Review of core HEP. Pt is independent.    PT Next Visit Plan discharge today     G code:  Mobility goal CI;  Discharge CI  PHYSICAL THERAPY DISCHARGE SUMMARY  Visits from Start of Care: 11  Current functional level related to goals / functional outcomes: See above clinical impressions  Remaining deficits: Majority of goals met   Education / Equipment: HEP Plan: Patient agrees to discharge.  Patient goals were partially met. Patient is being discharged due to meeting the stated rehab goals.  ?????  Ruben Im, PT 06/07/2015 10:59 AM Phone: [phone] Fax: [phone]     Problem List Patient Active Problem List   Diagnosis Date Noted  . Right lumbar radiculitis 04/18/2015  . Spinal stenosis of lumbar region with radiculopathy 04/18/2015  . Tinnitus aurium 02/21/2015  . Osteopenia, senile 02/20/2015  . BPPV (benign paroxysmal positional vertigo) 01/23/2015  . Obesity (BMI 30.0-34.9) 03/22/2013  . RLS (restless legs syndrome) 04/18/2012  . Routine general medical examination at a health care facility 10/19/2011  . Thyroid nodule   . Hyperglycemia 07/05/2009  . Hyperlipidemia with target LDL less than 130 12/11/2008  . Iron deficiency anemia  05/03/2008  . DEPRESSION/ANXIETY 05/03/2008  . Essential hypertension 05/03/2008  . Asthma 05/03/2008  . GERD 05/03/2008  . DEGENERATIVE JOINT DISEASE 05/03/2008  . OSA (obstructive sleep apnea) 05/03/2008    Dorene Ar, PTA 06/07/2015, 9:30 AM  [place], [place], [address] Phone: [phone]   Fax:  [phone]

## 2015-07-02 ENCOUNTER — Encounter: Payer: Self-pay | Admitting: Internal Medicine

## 2015-07-02 ENCOUNTER — Ambulatory Visit (INDEPENDENT_AMBULATORY_CARE_PROVIDER_SITE_OTHER): Payer: Medicare Other | Admitting: Internal Medicine

## 2015-07-02 VITALS — BP 130/80 | HR 89 | Temp 97.8°F | Resp 16 | Wt 196.0 lb

## 2015-07-02 DIAGNOSIS — I10 Essential (primary) hypertension: Secondary | ICD-10-CM

## 2015-07-02 DIAGNOSIS — M4806 Spinal stenosis, lumbar region: Secondary | ICD-10-CM

## 2015-07-02 DIAGNOSIS — M5416 Radiculopathy, lumbar region: Secondary | ICD-10-CM

## 2015-07-02 DIAGNOSIS — M48061 Spinal stenosis, lumbar region without neurogenic claudication: Secondary | ICD-10-CM

## 2015-07-02 NOTE — Progress Notes (Signed)
Subjective:  Patient ID: [name], female    DOB: Jan 13, 1951  Age: 64 y.o. MRN: [id number]  CC: Follow-up and Back Pain   HPI Sui L Savin presents for follow-up on low back pain. Her recent MRI of the lumbar spine showed spinal stenosis. She was referred to pain management but she has decided to not be seen by pain management. Her back pain has improved with physical therapy. She complains of an achy sensation in her lower back that rarely radiates into her right lower extremity. She denies any numbness, weakness, tingling. She tried tramadol but says it didn't help her back pain. She doesn't want to take any medications for the pain.  Outpatient Prescriptions Prior to Visit  Medication Sig Dispense Refill  . Albuterol Sulfate (PROAIR RESPICLICK) 267 (90 BASE) MCG/ACT AEPB Inhale 1 puff into the lungs 4 (four) times daily as needed. 1 each 11  . ALPRAZolam (XANAX) 0.25 MG tablet Take 1 tablet (0.25 mg total) by mouth 2 (two) times daily as needed. 35 tablet 2  . Arginine 1000 MG TABS Take 2 tablets by mouth 2 (two) times daily.     . Ascorbic Acid (VITAMIN C) 1000 MG tablet Take 1,000 mg by mouth daily.      Marland Kitchen aspirin 81 MG tablet Take 81 mg by mouth daily.      Marland Kitchen CALCIUM-MAGNESIUM-ZINC PO Take 1 tablet by mouth daily.      . Cholecalciferol (VITAMIN D) 2000 UNITS CAPS Take 1 capsule by mouth daily.      . clobetasol (OLUX) 0.05 % topical foam Apply topically 2 (two) times daily. 50 g 5  . Coenzyme Q10 (CO Q 10) 100 MG CAPS Take 1 tablet by mouth daily.      . fish oil-omega-3 fatty acids 1000 MG capsule Take 3 g by mouth daily.      Marland Kitchen losartan (COZAAR) 50 MG tablet Take 1 tablet (50 mg total) by mouth daily. 30 tablet 12  . Multiple Vitamin (MULTIVITAMIN) tablet Take 1 tablet by mouth daily.      . NON FORMULARY CITRULLINE 500 mg -- twice daily     . olopatadine (PATANOL) 0.1 % ophthalmic solution Place 1 drop into both eyes 2 (two) times daily.      Marland Kitchen PARoxetine (PAXIL-CR) 25  MG 24 hr tablet Take 1 tablet (25 mg total) by mouth daily. 90 tablet 3  . pravastatin (PRAVACHOL) 40 MG tablet take 1 tablet by mouth once daily 90 tablet 3  . traMADol (ULTRAM) 50 MG tablet Take 1 tablet (50 mg total) by mouth every 8 (eight) hours as needed. (Patient not taking: Reported on 07/02/2015) 65 tablet 2   No facility-administered medications prior to visit.    ROS Review of Systems  Constitutional: Negative.  Negative for fever, chills, diaphoresis, appetite change and fatigue.  HENT: Negative.   Eyes: Negative.   Respiratory: Negative.  Negative for cough, choking, chest tightness, shortness of breath and stridor.   Cardiovascular: Negative.  Negative for chest pain and leg swelling.  Gastrointestinal: Negative.  Negative for abdominal pain.  Endocrine: Negative.   Genitourinary: Negative.   Musculoskeletal: Positive for back pain. Negative for myalgias and gait problem.  Skin: Negative.   Allergic/Immunologic: Negative.   Neurological: Negative.  Negative for dizziness, tremors, weakness, light-headedness and numbness.  Hematological: Negative.  Negative for adenopathy. Does not bruise/bleed easily.  Psychiatric/Behavioral: Negative.     Objective:  BP 130/80 mmHg  Pulse 89  Temp(Src) 97.8  F (36.6 C)  Resp 16  Wt 196 lb (88.905 kg)  SpO2 94%  BP Readings from Last 3 Encounters:  07/02/15 130/80  04/18/15 130/74  02/20/15 138/82    Wt Readings from Last 3 Encounters:  07/02/15 196 lb (88.905 kg)  02/20/15 198 lb (89.812 kg)  01/23/15 199 lb (90.266 kg)    Physical Exam  Constitutional: She is oriented to person, place, and time. She appears well-developed and well-nourished. No distress.  HENT:  Head: Normocephalic and atraumatic.  Mouth/Throat: Oropharynx is clear and moist. No oropharyngeal exudate.  Eyes: Conjunctivae are normal. Right eye exhibits no discharge. Left eye exhibits no discharge. No scleral icterus.  Neck: Normal range of motion.  Neck supple. No JVD present. No tracheal deviation present. No thyromegaly present.  Cardiovascular: Normal rate, regular rhythm, normal heart sounds and intact distal pulses.  Exam reveals no gallop and no friction rub.   No murmur heard. Pulmonary/Chest: Effort normal and breath sounds normal. No stridor. No respiratory distress. She has no wheezes. She has no rales. She exhibits no tenderness.  Abdominal: Soft. Bowel sounds are normal. She exhibits no distension. There is no tenderness. There is no rebound and no guarding.  Musculoskeletal: Normal range of motion. She exhibits no edema or tenderness.  Lymphadenopathy:    She has no cervical adenopathy.  Neurological: She is alert and oriented to person, place, and time. She has normal strength and normal reflexes. She displays no atrophy, no tremor and normal reflexes. No cranial nerve deficit or sensory deficit. She exhibits normal muscle tone. She displays a negative Romberg sign. She displays no seizure activity. Coordination and gait normal.  Reflex Scores:      Tricep reflexes are 2+ on the right side and 2+ on the left side.      Bicep reflexes are 2+ on the right side and 2+ on the left side.      Brachioradialis reflexes are 2+ on the right side and 2+ on the left side.      Patellar reflexes are 2+ on the right side and 2+ on the left side.      Achilles reflexes are 2+ on the right side and 2+ on the left side. Neg SLR in BLE  Skin: Skin is warm and dry. No rash noted. She is not diaphoretic. No erythema. No pallor.  Vitals reviewed.   Lab Results  Component Value Date   WBC 5.4 01/23/2015   HGB 13.8 01/23/2015   HCT 40.7 01/23/2015   PLT 279.0 01/23/2015   GLUCOSE 83 01/23/2015   CHOL 159 01/23/2015   TRIG 192.0* 01/23/2015   HDL 36.80* 01/23/2015   LDLCALC 84 01/23/2015   ALT 21 01/23/2015   AST 17 01/23/2015   NA 142 01/23/2015   K 4.3 01/23/2015   CL 107 01/23/2015   CREATININE 0.75 01/23/2015   BUN 23 01/23/2015    CO2 30 01/23/2015   TSH 1.68 01/23/2015   HGBA1C 5.8 01/24/2013    Mr Lumbar Spine Wo Contrast  05/08/2015  CLINICAL DATA:  Right-sided radiculitis.  Right-sided back pain EXAM: MRI LUMBAR SPINE WITHOUT CONTRAST TECHNIQUE: Multiplanar, multisequence MR imaging of the lumbar spine was performed. No intravenous contrast was administered. COMPARISON:  Lumbar MRI 02/12/2006 FINDINGS: Normal lumbar alignment. Negative for fracture or mass lesion. 15 mm parapelvic cyst left kidney. Conus medullaris normal and terminates at L1-2. L1-2:  Negative L2-3: Diffuse bulging of the disc. Bilateral facet hypertrophy causing mild spinal stenosis. Progression of degenerative  change and stenosis since 2007 L3-4: Diffuse disc bulging and mild endplate osteophyte formation. Bilateral facet hypertrophy. Moderate spinal stenosis. Progression of degenerative changes stenosis since the prior MRI L4-5: Diffuse disc bulging. Superimposed central disc protrusion. Bilateral facet hypertrophy. Moderate to severe spinal stenosis has progressed in the interval. Subarticular stenosis bilaterally with expected impingement of the L5 nerve root bilaterally. L5-S1: Diffuse disc bulging more prominent on the left than the right. Mild spinal stenosis. Mild left S1 nerve root impingement. IMPRESSION: L2-3 mild spinal stenosis has progressed since 2007 L3-4 moderate spinal stenosis has progressed since prior study Moderate to severe spinal stenosis L4-5 has progressed. There is disc bulging and central disc protrusion. Expected impingement of the L5 nerve root bilaterally. Mild spinal stenosis L5-S1 with mild impingement of the left S1 nerve root. Electronically Signed   By: Franchot Gallo M.D.   On: 05/08/2015 07:08    Assessment & Plan:   There are no diagnoses linked to this encounter. I have discontinued Ms. Bodkin's traMADol. I am also having her maintain her aspirin, CALCIUM-MAGNESIUM-ZINC PO, Co Q 10, Vitamin D, fish oil-omega-3  fatty acids, NON FORMULARY, Arginine, multivitamin, olopatadine, vitamin C, pravastatin, Albuterol Sulfate, ALPRAZolam, losartan, clobetasol, and PARoxetine.  No orders of the defined types were placed in this encounter.     Follow-up: Return in about 6 months (around 12/31/2015).  Scarlette Calico, MD

## 2015-07-02 NOTE — Patient Instructions (Signed)

## 2015-07-02 NOTE — Assessment & Plan Note (Signed)
The pain has improved with physical therapy. She does not want to consider surgery or ESI. She does not want to take any medications for the pain. We'll recheck her in 4-6 months to see if there has been any progression. In the meantime she will let me know if she develops any new or worsening symptoms.

## 2015-07-02 NOTE — Assessment & Plan Note (Signed)
Her blood pressure is well controlled. 

## 2015-07-29 DIAGNOSIS — H524 Presbyopia: Secondary | ICD-10-CM | POA: Diagnosis not present

## 2015-07-29 DIAGNOSIS — H5213 Myopia, bilateral: Secondary | ICD-10-CM | POA: Diagnosis not present

## 2015-07-29 DIAGNOSIS — H2182 Plateau iris syndrome (post-iridectomy) (postprocedural): Secondary | ICD-10-CM | POA: Diagnosis not present

## 2015-07-29 DIAGNOSIS — H2513 Age-related nuclear cataract, bilateral: Secondary | ICD-10-CM | POA: Diagnosis not present

## 2015-08-21 ENCOUNTER — Ambulatory Visit: Payer: BC Managed Care – PPO | Admitting: Pulmonary Disease

## 2015-09-16 ENCOUNTER — Encounter: Payer: Self-pay | Admitting: Pulmonary Disease

## 2015-09-17 ENCOUNTER — Ambulatory Visit (INDEPENDENT_AMBULATORY_CARE_PROVIDER_SITE_OTHER): Payer: Medicare Other | Admitting: Pulmonary Disease

## 2015-09-17 ENCOUNTER — Encounter: Payer: Self-pay | Admitting: Pulmonary Disease

## 2015-09-17 VITALS — BP 136/84 | HR 76 | Ht 65.0 in | Wt 197.6 lb

## 2015-09-17 DIAGNOSIS — G2581 Restless legs syndrome: Secondary | ICD-10-CM

## 2015-09-17 DIAGNOSIS — G4733 Obstructive sleep apnea (adult) (pediatric): Secondary | ICD-10-CM

## 2015-09-17 NOTE — Assessment & Plan Note (Signed)
CPAP supplies will be renewed x 1 year  Weight loss encouraged, compliance with goal of at least 4-6 hrs every night is the expectation. Advised against medications with sedative side effects Cautioned against driving when sleepy - understanding that sleepiness will vary on a day to day basis  

## 2015-09-17 NOTE — Progress Notes (Signed)
   Subjective:    Patient ID: Kayla Khan, female    DOB: April 30, 1951, 65 y.o.   MRN: [id number]  HPI  Chief Complaint  Patient presents with  . Sleep Apnea    Former [place] Patient; doing well on CPAP; needs renewal on CPAP supplies x 1 year   For FU of OSA & RLS  she also has a history of spinal stenosis with neuropathy, and did not respond well to Requip in the past. She states RLS symptoms are intermittent and did not require medical therapy Her weight is unchanged. She is very compliant with CPAP and had excellent results, denies daytime drowsiness or snoring. Mask is okay and pressure was working well. No dryness or snoring that has been noted by family  Download 09/2015 - excellent usage on 10 cm  NPSG 2004:  AHI 10/hr, cpap optimized to 5cm.   Review of Systems neg for any significant sore throat, dysphagia, itching, sneezing, nasal congestion or excess/ purulent secretions, fever, chills, sweats, unintended wt loss, pleuritic or exertional cp, hempoptysis, orthopnea pnd or change in chronic leg swelling. Also denies presyncope, palpitations, heartburn, abdominal pain, nausea, vomiting, diarrhea or change in bowel or urinary habits, dysuria,hematuria, rash, arthralgias, visual complaints, headache, numbness weakness or ataxia.     Objective:   Physical Exam Gen. Pleasant, well-nourished, in no distress ENT - no lesions, no post nasal drip Neck: No JVD, no thyromegaly, no carotid bruits Lungs: no use of accessory muscles, no dullness to percussion, clear without rales or rhonchi  Cardiovascular: Rhythm regular, heart sounds  normal, no murmurs or gallops, no peripheral edema Musculoskeletal: No deformities, no cyanosis or clubbing         Assessment & Plan:

## 2015-09-17 NOTE — Assessment & Plan Note (Signed)
Stable off meds. ?

## 2015-09-17 NOTE — Patient Instructions (Signed)
CPAP supplies will be renewed x 1 year 

## 2015-09-20 ENCOUNTER — Encounter: Payer: Self-pay | Admitting: Pulmonary Disease

## 2015-10-08 ENCOUNTER — Telehealth: Payer: Self-pay | Admitting: Pulmonary Disease

## 2015-10-08 NOTE — Telephone Encounter (Signed)
CMN has been received by anita and is awaiting RA signature. LMTCB x1 for pt to make aware.

## 2015-10-09 NOTE — Telephone Encounter (Signed)
Called and spoke with the patient. I explained to her that our office has received the forms. I informed her that RA will return to the office on 10/11/15 and we would be able to get his signature. She voiced understanding and had no further questions.  Will forward message to [place]

## 2015-10-09 NOTE — Telephone Encounter (Signed)
[phone], pt cb

## 2015-10-09 NOTE — Telephone Encounter (Signed)
Dr. Elsworth Soho signed this form on 01/31 and I faxed it to [employer]. I just called and spoke with Jeani Hawking at [place] she got this form yesterday so I called the patient and let here know the [employer] has this form

## 2015-10-09 NOTE — Telephone Encounter (Signed)
LMTCB

## 2015-11-20 ENCOUNTER — Encounter: Payer: Self-pay | Admitting: Internal Medicine

## 2015-11-28 ENCOUNTER — Ambulatory Visit (INDEPENDENT_AMBULATORY_CARE_PROVIDER_SITE_OTHER): Payer: Medicare Other | Admitting: Internal Medicine

## 2015-11-28 ENCOUNTER — Other Ambulatory Visit (INDEPENDENT_AMBULATORY_CARE_PROVIDER_SITE_OTHER): Payer: Medicare Other

## 2015-11-28 VITALS — BP 140/90 | HR 87 | Temp 97.8°F | Resp 16 | Ht 65.0 in | Wt 201.2 lb

## 2015-11-28 DIAGNOSIS — I10 Essential (primary) hypertension: Secondary | ICD-10-CM

## 2015-11-28 DIAGNOSIS — R739 Hyperglycemia, unspecified: Secondary | ICD-10-CM | POA: Diagnosis not present

## 2015-11-28 DIAGNOSIS — E041 Nontoxic single thyroid nodule: Secondary | ICD-10-CM | POA: Diagnosis not present

## 2015-11-28 DIAGNOSIS — D509 Iron deficiency anemia, unspecified: Secondary | ICD-10-CM

## 2015-11-28 DIAGNOSIS — Z1159 Encounter for screening for other viral diseases: Secondary | ICD-10-CM

## 2015-11-28 DIAGNOSIS — E785 Hyperlipidemia, unspecified: Secondary | ICD-10-CM | POA: Diagnosis not present

## 2015-11-28 DIAGNOSIS — F341 Dysthymic disorder: Secondary | ICD-10-CM

## 2015-11-28 LAB — LIPID PANEL
CHOL/HDL RATIO: 4
Cholesterol: 161 mg/dL (ref 0–200)
HDL: 43.1 mg/dL (ref >39.00)
NONHDL: 118.31
TRIGLYCERIDES: 246 mg/dL — AB (ref 0.0–149.0)
VLDL: 49.2 mg/dL — ABNORMAL HIGH (ref 0.0–40.0)

## 2015-11-28 LAB — CBC WITH DIFFERENTIAL/PLATELET
BASOS PCT: 0.4 % (ref 0.0–3.0)
Basophils Absolute: 0 10*3/uL (ref 0.0–0.1)
EOS PCT: 2.5 % (ref 0.0–5.0)
Eosinophils Absolute: 0.2 10*3/uL (ref 0.0–0.7)
HCT: 42 % (ref 36.0–46.0)
Hemoglobin: 14.3 g/dL (ref 12.0–15.0)
LYMPHS ABS: 2.6 10*3/uL (ref 0.7–4.0)
Lymphocytes Relative: 36.4 % (ref 12.0–46.0)
MCHC: 34.1 g/dL (ref 30.0–36.0)
MCV: 92.1 fl (ref 78.0–100.0)
MONO ABS: 0.5 10*3/uL (ref 0.1–1.0)
Monocytes Relative: 7.3 % (ref 3.0–12.0)
NEUTROS ABS: 3.8 10*3/uL (ref 1.4–7.7)
NEUTROS PCT: 53.4 % (ref 43.0–77.0)
Platelets: 265 10*3/uL (ref 150.0–400.0)
RBC: 4.57 Mil/uL (ref 3.87–5.11)
RDW: 13.3 % (ref 11.5–15.5)
WBC: 7.2 10*3/uL (ref 4.0–10.5)

## 2015-11-28 LAB — BASIC METABOLIC PANEL
BUN: 23 mg/dL (ref 6–23)
CALCIUM: 10.2 mg/dL (ref 8.4–10.5)
CO2: 32 mEq/L (ref 19–32)
CREATININE: 0.85 mg/dL (ref 0.40–1.20)
Chloride: 103 mEq/L (ref 96–112)
GFR: 70.87 mL/min (ref >60.00)
Glucose, Bld: 104 mg/dL — ABNORMAL HIGH (ref 70–99)
Potassium: 3.9 mEq/L (ref 3.5–5.1)
SODIUM: 142 meq/L (ref 135–145)

## 2015-11-28 LAB — LDL CHOLESTEROL, DIRECT: LDL DIRECT: 87 mg/dL

## 2015-11-28 LAB — HEMOGLOBIN A1C: HEMOGLOBIN A1C: 6.1 % (ref 4.6–6.5)

## 2015-11-28 MED ORDER — PAROXETINE HCL 10 MG PO TABS: 10.0000 mg | ORAL_TABLET | Freq: Every day | ORAL | Status: DC

## 2015-11-28 MED ORDER — LOSARTAN POTASSIUM 50 MG PO TABS: 50.0000 mg | ORAL_TABLET | Freq: Every day | ORAL | Status: DC

## 2015-11-28 NOTE — Progress Notes (Signed)
Subjective:  Patient ID: Kayla Khan, female    DOB: June 26, 1951  Age: 65 y.o. MRN: [id number]  CC: Depression; Hypertension; and Hyperlipidemia   HPI Kayla Khan presents for follow-up on depression and anxiety as well as high blood pressure.  She complains that her insurance company will no longer cover Paxil CR, she also wants to start lowering the Paxil dose, she has felt well lately with no symptoms of depression and rare feelings of anxiety anxiety. She rarely takes Xanax.  She also tells me that her blood pressures been well controlled on losartan, she denies chest pain, shortness of breath, dyspnea on exertion, headache, blurred vision, or fatigue.  She is also due for a cholesterol check, she is tolerating Pravachol well with no muscle aches or joint aches.  Outpatient Prescriptions Prior to Visit  Medication Sig Dispense Refill  . Albuterol Sulfate (PROAIR RESPICLICK) 123XX123 (90 BASE) MCG/ACT AEPB Inhale 1 puff into the lungs 4 (four) times daily as needed. 1 each 11  . ALPRAZolam (XANAX) 0.25 MG tablet Take 1 tablet (0.25 mg total) by mouth 2 (two) times daily as needed. 35 tablet 2  . Ascorbic Acid (VITAMIN C) 1000 MG tablet Take 1,000 mg by mouth daily.      Marland Kitchen aspirin 81 MG tablet Take 81 mg by mouth daily.      Marland Kitchen CALCIUM-MAGNESIUM-ZINC PO Take 1 tablet by mouth daily.      . clobetasol (OLUX) 0.05 % topical foam Apply topically 2 (two) times daily. 50 g 5  . fish oil-omega-3 fatty acids 1000 MG capsule Take 3 g by mouth daily.      . Multiple Vitamin (MULTIVITAMIN) tablet Take 1 tablet by mouth daily.      Marland Kitchen olopatadine (PATANOL) 0.1 % ophthalmic solution Place 1 drop into both eyes 2 (two) times daily.      . pravastatin (PRAVACHOL) 40 MG tablet take 1 tablet by mouth once daily 90 tablet 3  . Arginine 1000 MG TABS Take 2 tablets by mouth 2 (two) times daily.     . Coenzyme Q10 (CO Q 10) 100 MG CAPS Take 1 tablet by mouth daily.      Marland Kitchen losartan (COZAAR) 50 MG tablet  Take 1 tablet (50 mg total) by mouth daily. 30 tablet 12  . NON FORMULARY CITRULLINE 500 mg -- twice daily     . PARoxetine (PAXIL-CR) 25 MG 24 hr tablet Take 1 tablet (25 mg total) by mouth daily. 90 tablet 3   No facility-administered medications prior to visit.    ROS Review of Systems  Constitutional: Negative.  Negative for fever, chills and fatigue.  HENT: Negative.  Negative for dental problem, sinus pressure, sore throat and trouble swallowing.   Eyes: Negative.   Respiratory: Negative.  Negative for cough, choking, chest tightness, shortness of breath and stridor.   Cardiovascular: Negative.  Negative for chest pain, palpitations and leg swelling.  Gastrointestinal: Negative.  Negative for nausea, vomiting, abdominal pain, diarrhea, constipation and blood in stool.  Endocrine: Negative.   Genitourinary: Negative.  Negative for dysuria, frequency and difficulty urinating.  Musculoskeletal: Negative.  Negative for myalgias, back pain, arthralgias and neck pain.  Skin: Negative.   Allergic/Immunologic: Negative.   Neurological: Negative.   Hematological: Negative.  Negative for adenopathy. Does not bruise/bleed easily.  Psychiatric/Behavioral: Negative for suicidal ideas, hallucinations, behavioral problems, confusion, sleep disturbance, self-injury, dysphoric mood, decreased concentration and agitation. The patient is nervous/anxious. The patient is not hyperactive.  Objective:  BP 140/90 mmHg  Pulse 87  Temp(Src) 97.8 F (36.6 C) (Oral)  Ht 5\' 5"  (1.651 m)  Wt 201 lb 4 oz (91.286 kg)  BMI 33.49 kg/m2  SpO2 94%  BP Readings from Last 3 Encounters:  11/28/15 140/90  09/17/15 136/84  07/02/15 130/80    Wt Readings from Last 3 Encounters:  11/28/15 201 lb 4 oz (91.286 kg)  09/17/15 197 lb 9.6 oz (89.631 kg)  07/02/15 196 lb (88.905 kg)    Physical Exam  Constitutional: She is oriented to person, place, and time. No distress.  HENT:  Head: Normocephalic and  atraumatic.  Mouth/Throat: Oropharynx is clear and moist. No oropharyngeal exudate.  Eyes: Conjunctivae are normal. Right eye exhibits no discharge. Left eye exhibits no discharge. No scleral icterus.  Neck: Normal range of motion. Neck supple. No JVD present. No tracheal tenderness present. No tracheal deviation present. No thyroid mass and no thyromegaly present.  Cardiovascular: Normal rate, regular rhythm, normal heart sounds and intact distal pulses.  Exam reveals no gallop and no friction rub.   No murmur heard. Pulmonary/Chest: Effort normal and breath sounds normal. No stridor. No respiratory distress. She has no wheezes. She has no rales. She exhibits no tenderness.  Abdominal: Soft. Bowel sounds are normal. She exhibits no distension and no mass. There is no tenderness. There is no rebound and no guarding.  Musculoskeletal: Normal range of motion. She exhibits no edema or tenderness.  Lymphadenopathy:    She has no cervical adenopathy.  Neurological: She is oriented to person, place, and time.  Skin: Skin is warm and dry. No rash noted. She is not diaphoretic. No erythema. No pallor.  Psychiatric: She has a normal mood and affect. Her behavior is normal. Judgment and thought content normal.  Vitals reviewed.   Lab Results  Component Value Date   WBC 7.2 11/28/2015   HGB 14.3 11/28/2015   HCT 42.0 11/28/2015   PLT 265.0 11/28/2015   GLUCOSE 104* 11/28/2015   CHOL 161 11/28/2015   TRIG 246.0* 11/28/2015   HDL 43.10 11/28/2015   LDLDIRECT 87.0 11/28/2015   LDLCALC 84 01/23/2015   ALT 21 01/23/2015   AST 17 01/23/2015   NA 142 11/28/2015   K 3.9 11/28/2015   CL 103 11/28/2015   CREATININE 0.85 11/28/2015   BUN 23 11/28/2015   CO2 32 11/28/2015   TSH 1.85 11/28/2015   HGBA1C 6.1 11/28/2015    Mr Lumbar Spine Wo Contrast  05/08/2015  CLINICAL DATA:  Right-sided radiculitis.  Right-sided back pain EXAM: MRI LUMBAR SPINE WITHOUT CONTRAST TECHNIQUE: Multiplanar,  multisequence MR imaging of the lumbar spine was performed. No intravenous contrast was administered. COMPARISON:  Lumbar MRI 02/12/2006 FINDINGS: Normal lumbar alignment. Negative for fracture or mass lesion. 15 mm parapelvic cyst left kidney. Conus medullaris normal and terminates at L1-2. L1-2:  Negative L2-3: Diffuse bulging of the disc. Bilateral facet hypertrophy causing mild spinal stenosis. Progression of degenerative change and stenosis since 2007 L3-4: Diffuse disc bulging and mild endplate osteophyte formation. Bilateral facet hypertrophy. Moderate spinal stenosis. Progression of degenerative changes stenosis since the prior MRI L4-5: Diffuse disc bulging. Superimposed central disc protrusion. Bilateral facet hypertrophy. Moderate to severe spinal stenosis has progressed in the interval. Subarticular stenosis bilaterally with expected impingement of the L5 nerve root bilaterally. L5-S1: Diffuse disc bulging more prominent on the left than the right. Mild spinal stenosis. Mild left S1 nerve root impingement. IMPRESSION: L2-3 mild spinal stenosis has progressed since 2007 L3-4  moderate spinal stenosis has progressed since prior study Moderate to severe spinal stenosis L4-5 has progressed. There is disc bulging and central disc protrusion. Expected impingement of the L5 nerve root bilaterally. Mild spinal stenosis L5-S1 with mild impingement of the left S1 nerve root. Electronically Signed   By: Franchot Gallo M.D.   On: 05/08/2015 07:08    Assessment & Plan:   Kayla Khan was seen today for depression, hypertension and hyperlipidemia.  Diagnoses and all orders for this visit:  Essential hypertension- her blood pressures adequately well controlled, her electrolytes and renal function are stable. -     losartan (COZAAR) 50 MG tablet; Take 1 tablet (50 mg total) by mouth daily. -     CBC with Differential/Platelet; Future -     Basic metabolic panel; Future  Thyroid nodule- I do not feel a nodule today,  her TSH remains normal -     TSH; Future  Hyperglycemia- she is prediabetic, no medications are needed, she agrees to work on her lifestyle modifications. -     Basic metabolic panel; Future -     Hemoglobin A1c; Future  Iron deficiency anemia- improvement noted -     CBC with Differential/Platelet; Future  Hyperlipidemia with target LDL less than 130- she has achieved her LDL goal is doing well on the statin. -     Lipid panel; Future -     TSH; Future  DEPRESSION/ANXIETY- she is doing quite well and has decided to taper the Paxil dose, her insurance no longer covers Paxil CR so I changed to generic paroxetine 10 mg a day, I've asked her to start a slow taper over the next 3-4 months, she will decrease to 10 mg once a day for a month, then 10 mg every other day for a month, and then 10 mg every 3 days for a month and then will discontinue, if she has any relapse of her symptoms she will let me know and I will reevaluate. -     PARoxetine (PAXIL) 10 MG tablet; Take 1 tablet (10 mg total) by mouth daily.  Need for hepatitis C screening test -     Hepatitis C antibody; Future  I have discontinued Ms. Hanna's Co Q 10, NON FORMULARY, Arginine, and PARoxetine. I am also having her start on PARoxetine. Additionally, I am having her maintain her aspirin, CALCIUM-MAGNESIUM-ZINC PO, fish oil-omega-3 fatty acids, multivitamin, olopatadine, vitamin C, pravastatin, Albuterol Sulfate, ALPRAZolam, clobetasol, and losartan.  Meds ordered this encounter  Medications  . PARoxetine (PAXIL) 10 MG tablet    Sig: Take 1 tablet (10 mg total) by mouth daily.    Dispense:  90 tablet    Refill:  1  . losartan (COZAAR) 50 MG tablet    Sig: Take 1 tablet (50 mg total) by mouth daily.    Dispense:  30 tablet    Refill:  11     Follow-up: Return in about 6 months (around 05/30/2016).  Scarlette Calico, MD

## 2015-11-28 NOTE — Patient Instructions (Signed)
Hypertension Hypertension, commonly called high blood pressure, is when the force of blood pumping through your arteries is too strong. Your arteries are the blood vessels that carry blood from your heart throughout your body. A blood pressure reading consists of a higher number over a lower number, such as 110/72. The higher number (systolic) is the pressure inside your arteries when your heart pumps. The lower number (diastolic) is the pressure inside your arteries when your heart relaxes. Ideally you want your blood pressure below 120/80. Hypertension forces your heart to work harder to pump blood. Your arteries may become narrow or stiff. Having untreated or uncontrolled hypertension can cause heart attack, stroke, kidney disease, and other problems. RISK FACTORS Some risk factors for high blood pressure are controllable. Others are not.  Risk factors you cannot control include:   Race. You may be at higher risk if you are African American.  Age. Risk increases with age.  Gender. Men are at higher risk than women before age 45 years. After age 65, women are at higher risk than men. Risk factors you can control include:  Not getting enough exercise or physical activity.  Being overweight.  Getting too much fat, sugar, calories, or salt in your diet.  Drinking too much alcohol. SIGNS AND SYMPTOMS Hypertension does not usually cause signs or symptoms. Extremely high blood pressure (hypertensive crisis) may cause headache, anxiety, shortness of breath, and nosebleed. DIAGNOSIS To check if you have hypertension, your health care provider will measure your blood pressure while you are seated, with your arm held at the level of your heart. It should be measured at least twice using the same arm. Certain conditions can cause a difference in blood pressure between your right and left arms. A blood pressure reading that is higher than normal on one occasion does not mean that you need treatment. If  it is not clear whether you have high blood pressure, you may be asked to return on a different day to have your blood pressure checked again. Or, you may be asked to monitor your blood pressure at home for 1 or more weeks. TREATMENT Treating high blood pressure includes making lifestyle changes and possibly taking medicine. Living a healthy lifestyle can help lower high blood pressure. You may need to change some of your habits. Lifestyle changes may include:  Following the DASH diet. This diet is high in fruits, vegetables, and whole grains. It is low in salt, red meat, and added sugars.  Keep your sodium intake below 2,300 mg per day.  Getting at least 30-45 minutes of aerobic exercise at least 4 times per week.  Losing weight if necessary.  Not smoking.  Limiting alcoholic beverages.  Learning ways to reduce stress. Your health care provider may prescribe medicine if lifestyle changes are not enough to get your blood pressure under control, and if one of the following is true:  You are 18-59 years of age and your systolic blood pressure is above 140.  You are 60 years of age or older, and your systolic blood pressure is above 150.  Your diastolic blood pressure is above 90.  You have diabetes, and your systolic blood pressure is over 140 or your diastolic blood pressure is over 90.  You have kidney disease and your blood pressure is above 140/90.  You have heart disease and your blood pressure is above 140/90. Your personal target blood pressure may vary depending on your medical conditions, your age, and other factors. HOME CARE INSTRUCTIONS    Have your blood pressure rechecked as directed by your health care provider.   Take medicines only as directed by your health care provider. Follow the directions carefully. Blood pressure medicines must be taken as prescribed. The medicine does not work as well when you skip doses. Skipping doses also puts you at risk for  problems.  Do not smoke.   Monitor your blood pressure at home as directed by your health care provider. SEEK MEDICAL CARE IF:   You think you are having a reaction to medicines taken.  You have recurrent headaches or feel dizzy.  You have swelling in your ankles.  You have trouble with your vision. SEEK IMMEDIATE MEDICAL CARE IF:  You develop a severe headache or confusion.  You have unusual weakness, numbness, or feel faint.  You have severe chest or abdominal pain.  You vomit repeatedly.  You have trouble breathing. MAKE SURE YOU:   Understand these instructions.  Will watch your condition.  Will get help right away if you are not doing well or get worse.   This information is not intended to replace advice given to you by your health care provider. Make sure you discuss any questions you have with your health care provider.   Document Released: 08/24/2005 Document Revised: 01/08/2015 Document Reviewed: 06/16/2013 Elsevier Interactive Patient Education 2016 Elsevier Inc.  

## 2015-11-28 NOTE — Progress Notes (Signed)
Pre visit review using our clinic review tool, if applicable. No additional management support is needed unless otherwise documented below in the visit note. (sarah self, [employer] student  documented this record)  

## 2015-11-29 LAB — TSH: TSH: 1.85 u[IU]/mL (ref 0.35–4.50)

## 2015-11-30 ENCOUNTER — Encounter: Payer: Self-pay | Admitting: Internal Medicine

## 2015-12-02 ENCOUNTER — Encounter: Payer: Self-pay | Admitting: Internal Medicine

## 2015-12-02 LAB — HEPATITIS C ANTIBODY: HCV AB: NEGATIVE

## 2015-12-25 LAB — HM MAMMOGRAPHY

## 2015-12-27 ENCOUNTER — Encounter: Payer: Self-pay | Admitting: Internal Medicine

## 2016-03-11 ENCOUNTER — Other Ambulatory Visit: Payer: Self-pay

## 2016-03-11 DIAGNOSIS — I10 Essential (primary) hypertension: Secondary | ICD-10-CM

## 2016-03-11 MED ORDER — LOSARTAN POTASSIUM 50 MG PO TABS: 50.0000 mg | ORAL_TABLET | Freq: Every day | ORAL | Status: DC

## 2016-03-11 NOTE — Telephone Encounter (Signed)
90 day supply

## 2016-03-14 ENCOUNTER — Ambulatory Visit (INDEPENDENT_AMBULATORY_CARE_PROVIDER_SITE_OTHER): Payer: Medicare Other | Admitting: Family Medicine

## 2016-03-14 ENCOUNTER — Encounter: Payer: Self-pay | Admitting: Family Medicine

## 2016-03-14 VITALS — BP 128/74 | HR 81 | Temp 98.4°F | Ht 65.0 in | Wt 195.0 lb

## 2016-03-14 DIAGNOSIS — R42 Dizziness and giddiness: Secondary | ICD-10-CM | POA: Diagnosis not present

## 2016-03-14 NOTE — Patient Instructions (Signed)
I still think this is benign positional vertigo. May try meclizine. If ongoing short episodes of vertigo, let us know for retrial vestibular rehab. If prolonged episode or severe worsening, please return to see [place] or seek urgent care for recheck.

## 2016-03-14 NOTE — Progress Notes (Signed)
Pre visit review using our clinic review tool, if applicable. No additional management support is needed unless otherwise documented below in the visit note. 

## 2016-03-14 NOTE — Progress Notes (Signed)
BP 128/74 mmHg  Pulse 81  Temp(Src) 98.4 F (36.9 C) (Oral)  Ht 5\' 5"  (1.651 m)  Wt 195 lb (88.451 kg)  BMI 32.45 kg/m2  SpO2 96%   CC: dizzy  Subjective:    Patient ID: Kayla Khan, female    DOB: [birth year], 65 y.o.   MRN: [id number]  HPI: Kayla Khan is a 65 y.o. female presenting on 03/14/2016 for Dizziness and Sinus Problem   Recurrent vertigo "room spinning" symptoms over last 2 months, worsened. Saw ENT audiologist twice in last few months, referred back to PCP office due to ongoing symptoms. Trouble driving due to severity yesterday, symptoms fully reproducible with looking up and down. + nausea and head fullness with this. Today feeling better.   No fevers/chills, headaches, vision changes, slurred speech, unilateral weakness, confusion. Never syncope, dysequilibrium. No paresthesias.   Has meclizine 12.5mg  at home.   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   H/o HLD, HTN. + fmhx CAD/CVA.  Father with cerebral hemorrhage age 24  Yesterday inducible vertigo with dix hallpike to right, worsened with epley at home.   Relevant past medical, surgical, family and social history reviewed and updated as indicated. Interim medical history since our last visit reviewed. Allergies and medications reviewed and updated. Current Outpatient Prescriptions on File Prior to Visit  Medication Sig  . Albuterol Sulfate (PROAIR RESPICLICK) [id number] (90 BASE) MCG/ACT AEPB Inhale 1 puff into the lungs 4 (four) times daily as needed.  . ALPRAZolam (XANAX) 0.25 MG tablet Take 1 tablet (0.25 mg total) by mouth 2 (two) times daily as needed.  . Ascorbic Acid (VITAMIN C) 1000 MG tablet Take 1,000 mg by mouth daily.    Marland Kitchen aspirin 81 MG tablet Take 81 mg by mouth daily.    Marland Kitchen CALCIUM-MAGNESIUM-ZINC PO Take 1 tablet by mouth daily.    . clobetasol (OLUX) 0.05 % topical foam Apply topically 2 (two) times daily.  . fish oil-omega-3 fatty acids 1000 MG capsule Take 3 g by  mouth daily.    Marland Kitchen losartan (COZAAR) 50 MG tablet Take 1 tablet (50 mg total) by mouth daily.  . Multiple Vitamin (MULTIVITAMIN) tablet Take 1 tablet by mouth daily.    Marland Kitchen olopatadine (PATANOL) 0.1 % ophthalmic solution Place 1 drop into both eyes 2 (two) times daily.    Marland Kitchen PARoxetine (PAXIL) 10 MG tablet Take 1 tablet (10 mg total) by mouth daily. (Patient taking differently: Take 5 mg by mouth every other day. )  . pravastatin (PRAVACHOL) 40 MG tablet take 1 tablet by mouth once daily   No current facility-administered medications on file prior to visit.    Review of Systems Per HPI unless specifically indicated in ROS section     Objective:    BP 128/74 mmHg  Pulse 81  Temp(Src) 98.4 F (36.9 C) (Oral)  Ht 5\' 5"  (1.651 m)  Wt 195 lb (88.451 kg)  BMI 32.45 kg/m2  SpO2 96%  Wt Readings from Last 3 Encounters:  03/14/16 195 lb (88.451 kg)  11/28/15 201 lb 4 oz (91.286 kg)  09/17/15 197 lb 9.6 oz (89.631 kg)    Physical Exam  Constitutional: She is oriented to person, place, and time. She appears well-developed and well-nourished. No distress.  HENT:  Head: Normocephalic and atraumatic.  Mouth/Throat: Oropharynx is clear and moist. No oropharyngeal exudate.  Eyes: Conjunctivae and EOM are normal. Pupils are equal, round, and reactive to light. No scleral icterus.  Neck: Normal  range of motion. Neck supple. Carotid bruit is not present.  Cardiovascular: Normal rate, regular rhythm, normal heart sounds and intact distal pulses.   No murmur heard. Pulmonary/Chest: Effort normal and breath sounds normal. No respiratory distress. She has no wheezes. She has no rales.  Musculoskeletal: She exhibits no edema.  Lymphadenopathy:    She has no cervical adenopathy.  Neurological: She is alert and oriented to person, place, and time. She has normal strength. No cranial nerve deficit or sensory deficit. She displays a negative Romberg sign. Coordination and gait normal.  CN 2-12 intact FTN  intct EOMI intact Neg dix hallpike HINTS negative  Skin: Skin is warm and dry. No rash noted.  Psychiatric: She has a normal mood and affect.  Nursing note and vitals reviewed.      Assessment & Plan:  Over 25 minutes were spent face-to-face with the patient during this encounter and >50% of that time was spent on counseling and coordination of care  Problem List Items Addressed This Visit    Vertigo - Primary    Reassuring exam today, nonfocal neurological exam - anticipate peripheral cause like ongoing BPPV. May try meclizine at home.  If recurrent typical episodes offered vestibular rehab referral. If prolonged vertigo or acute worsening or new symptoms like headache, rec urgent eval.  Pt agrees with plan.          Follow up plan: Return if symptoms worsen or fail to improve.  Ria Bush, MD

## 2016-03-14 NOTE — Assessment & Plan Note (Signed)
Reassuring exam today, nonfocal neurological exam - anticipate peripheral cause like ongoing BPPV. May try meclizine at home.  If recurrent typical episodes offered vestibular rehab referral. If prolonged vertigo or acute worsening or new symptoms like headache, rec urgent eval.  Pt agrees with plan.

## 2016-05-22 ENCOUNTER — Other Ambulatory Visit: Payer: Self-pay | Admitting: Physician Assistant

## 2016-06-25 ENCOUNTER — Encounter: Payer: Self-pay | Admitting: Family Medicine

## 2016-06-25 ENCOUNTER — Ambulatory Visit (INDEPENDENT_AMBULATORY_CARE_PROVIDER_SITE_OTHER): Payer: Medicare Other | Admitting: Family Medicine

## 2016-06-25 VITALS — BP 138/86 | HR 85 | Temp 98.2°F | Wt 194.4 lb

## 2016-06-25 DIAGNOSIS — J0140 Acute pansinusitis, unspecified: Secondary | ICD-10-CM | POA: Diagnosis not present

## 2016-06-25 DIAGNOSIS — R05 Cough: Secondary | ICD-10-CM

## 2016-06-25 DIAGNOSIS — R059 Cough, unspecified: Secondary | ICD-10-CM

## 2016-06-25 MED ORDER — BENZONATATE 100 MG PO CAPS: 100.0000 mg | ORAL_CAPSULE | Freq: Three times a day (TID) | ORAL | 0 refills | Status: DC

## 2016-06-25 MED ORDER — DOXYCYCLINE HYCLATE 100 MG PO TABS: 100.0000 mg | ORAL_TABLET | Freq: Two times a day (BID) | ORAL | 0 refills | Status: DC

## 2016-06-25 NOTE — Progress Notes (Signed)
Subjective:    Patient ID: Kayla Khan, female    DOB: [birth year], 65 y.o.   MRN: [id number]  HPI  Ms. Kayla Khan is a 65 year old female who presents today with chills, rhinitis, and cough that has been present for one week.  Associated symptoms of sinus pressure/pain, ear pressure/pain, rhinitis with green drainage, cough that is nonproductive, and sore throat.  Symptoms were improving however have returned and worsened.  Denies fever, N/V, diarrhea, SOB, and myalgias. History of bronchitis/asthma. 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   No recent antibiotic use.   Treatment at home includes coricidin that has provided has minimal benefit. She denies use of her rescue inhaler at this time.  No aggravating or alleviating factors noted.  Review of Systems  Constitutional: Positive for chills and fatigue. Negative for fever.  HENT: Positive for congestion, postnasal drip, rhinorrhea, sinus pressure and sore throat.   Eyes: Negative for visual disturbance.  Respiratory: Positive for cough. Negative for shortness of breath and wheezing.   Cardiovascular: Negative for chest pain and palpitations.  Gastrointestinal: Negative for abdominal pain, diarrhea, nausea and vomiting.  Musculoskeletal: Negative for myalgias.  Skin: Negative for rash.  Neurological: Negative for dizziness, weakness, light-headedness and headaches.   Past Medical History:  Diagnosis Date  . Allergic conjunctivitis   . Anemia, iron deficiency   . Angina pectoris   . Arthritis   . Asthma   . 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 Depression with anxiety   . Diverticulosis of colon   . Diverticulosis of colon   . DJD (degenerative joint disease)   . Ejection fraction    70%, catheter 2004 / normal LV function nuclear, 2008 ( no echo data as of December 23, 2010)  .  Fatigue   . GERD (gastroesophageal reflux disease)   . HLA B27 positive   . HLD (hyperlipidemia)   . Palpitations   . Sleep apnea   . Thyroid nodule    The patient had a thyroid nodule that was surgically removed in the past.  She has one half of her thyroid and does not take replacement of Taxol and he was benign     Social History   Social History  . Marital status: Divorced    Spouse name: N/A  . Number of children: N/A  . Years of education: N/A   Occupational History  . teacher    Social History Main Topics  . Smoking status: Never Smoker  . Smokeless tobacco: Never Used  . Alcohol use No  . Drug use: No  . Sexual activity: Not Currently   Other Topics Concern  . Not on file   Social History Narrative  . No narrative on file    Past Surgical History:  Procedure Laterality Date  . APPENDECTOMY    . BREAST BIOPSY    . CESAREAN SECTION     x2  . GANGLION CYST EXCISION    . THYROIDECTOMY, PARTIAL     left  . TUBAL LIGATION    . VESICOVAGINAL FISTULA CLOSURE W/ TAH      Family History  Problem Relation Age of Onset  . Diabetes    . Heart disease    . Cancer    . Arthritis    . Hypertension    . Aneurysm    . Heart attack  Maternal Grandfather   . Stroke Paternal Grandfather   . Stroke Maternal Grandmother   . Hypertension Father   . Cancer Mother   . Cancer Brother   . Aneurysm Father 76    cerebral hemorrhage    Allergies  Allergen Reactions  . Amoxicillin-Pot Clavulanate   . Latex   . Meperidine Hcl     Current Outpatient Prescriptions on File Prior to Visit  Medication Sig Dispense Refill  . Albuterol Sulfate (PROAIR RESPICLICK) 032 (90 BASE) MCG/ACT AEPB Inhale 1 puff into the lungs 4 (four) times daily as needed. 1 each 11  . ALPRAZolam (XANAX) 0.25 MG tablet Take 1 tablet (0.25 mg total) by mouth 2 (two) times daily as needed. 35 tablet 2  . Ascorbic Acid (VITAMIN C) 1000 MG tablet Take 1,000 mg by mouth daily.      Marland Kitchen aspirin 81 MG  tablet Take 81 mg by mouth daily.      Marland Kitchen CALCIUM-MAGNESIUM-ZINC PO Take 1 tablet by mouth daily.      . clobetasol (OLUX) 0.05 % topical foam Apply topically 2 (two) times daily. 50 g 5  . fish oil-omega-3 fatty acids 1000 MG capsule Take 3 g by mouth daily.      Marland Kitchen losartan (COZAAR) 50 MG tablet Take 1 tablet (50 mg total) by mouth daily. 90 tablet 3  . Multiple Vitamin (MULTIVITAMIN) tablet Take 1 tablet by mouth daily.      . pravastatin (PRAVACHOL) 40 MG tablet take 1 tablet by mouth once daily 90 tablet 3   No current facility-administered medications on file prior to visit.     BP 138/86 (BP Location: Left Arm, Patient Position: Sitting, Cuff Size: Normal)   Pulse 85   Temp 98.2 F (36.8 C) (Oral)   Wt 194 lb 6.4 oz (88.2 kg)   SpO2 98%   BMI 32.35 kg/m        Objective:   Physical Exam  Constitutional: She is oriented to person, place, and time. She appears well-developed and well-nourished.  HENT:  Right Ear: Tympanic membrane normal.  Left Ear: Tympanic membrane normal.  Nose: Rhinorrhea present. Right sinus exhibits maxillary sinus tenderness and frontal sinus tenderness. Left sinus exhibits maxillary sinus tenderness and frontal sinus tenderness.  Mouth/Throat: Mucous membranes are normal. No oropharyngeal exudate or posterior oropharyngeal erythema.  Eyes: Pupils are equal, round, and reactive to light. No scleral icterus.  Neck: Neck supple.  Cardiovascular: Normal rate and regular rhythm.   Pulmonary/Chest: Effort normal and breath sounds normal. She has no wheezes. She has no rales.  Abdominal: Soft. Bowel sounds are normal.  Lymphadenopathy:    She has cervical adenopathy.  Neurological: She is alert and oriented to person, place, and time. Coordination normal.  Skin: Skin is warm and dry. No rash noted.       Assessment & Plan:  1. Acute pansinusitis, recurrence not specified Symptom duration of one week and double sickening presentation with recent  exposure to sick children support treatment for sinusitis. Allergy to augmentin present. Advised supportive measures of increasing fluid intake and follow up if symptoms do not improve with treatment, worsen, or she develops a fever >101. Further advised her to follow up if she begins to use her rescue inhaler more than twice in one week as she has not had to use it during the course of this illness. - doxycycline (VIBRA-TABS) 100 MG tablet; Take 1 tablet (100 mg total) by mouth 2 (two) times daily.  Dispense: 20  tablet; Refill: 0  2. Cough  - benzonatate (TESSALON) 100 MG capsule; Take 1 capsule (100 mg total) by mouth 3 (three) times daily.  Dispense: 20 capsule; Refill: 0  Advised recommended follow up with her PCP as plan or sooner if symptoms do not improve with treatment.  Delano Metz, FNP-C

## 2016-06-25 NOTE — Progress Notes (Signed)
Patient received education resource, including the self-management goal and tool. Patient verbalized understanding. 

## 2016-06-25 NOTE — Patient Instructions (Signed)
Please take medication as directed and follow up if symptoms do not improve, worsen, or you develop a fever >101.  Feel better soon!   Sinusitis, Adult Sinusitis is redness, soreness, and inflammation of the paranasal sinuses. Paranasal sinuses are air pockets within the bones of your face. They are located beneath your eyes, in the middle of your forehead, and above your eyes. In healthy paranasal sinuses, mucus is able to drain out, and air is able to circulate through them by way of your nose. However, when your paranasal sinuses are inflamed, mucus and air can become trapped. This can allow bacteria and other germs to grow and cause infection. Sinusitis can develop quickly and last only a short time (acute) or continue over a long period (chronic). Sinusitis that lasts for more than 12 weeks is considered chronic. CAUSES Causes of sinusitis include:  Allergies.  Structural abnormalities, such as displacement of the cartilage that separates your nostrils (deviated septum), which can decrease the air flow through your nose and sinuses and affect sinus drainage.  Functional abnormalities, such as when the small hairs (cilia) that line your sinuses and help remove mucus do not work properly or are not present. SIGNS AND SYMPTOMS Symptoms of acute and chronic sinusitis are the same. The primary symptoms are pain and pressure around the affected sinuses. Other symptoms include:  Upper toothache.  Earache.  Headache.  Bad breath.  Decreased sense of smell and taste.  A cough, which worsens when you are lying flat.  Fatigue.  Fever.  Thick drainage from your nose, which often is green and may contain pus (purulent).  Swelling and warmth over the affected sinuses. DIAGNOSIS Your health care provider will perform a physical exam. During your exam, your health care provider may perform any of the following to help determine if you have acute sinusitis or chronic sinusitis:  Look in  your nose for signs of abnormal growths in your nostrils (nasal polyps).  Tap over the affected sinus to check for signs of infection.  View the inside of your sinuses using an imaging device that has a light attached (endoscope). If your health care provider suspects that you have chronic sinusitis, one or more of the following tests may be recommended:  Allergy tests.  Nasal culture. A sample of mucus is taken from your nose, sent to a lab, and screened for bacteria.  Nasal cytology. A sample of mucus is taken from your nose and examined by your health care provider to determine if your sinusitis is related to an allergy. TREATMENT Most cases of acute sinusitis are related to a viral infection and will resolve on their own within 10 days. Sometimes, medicines are prescribed to help relieve symptoms of both acute and chronic sinusitis. These may include pain medicines, decongestants, nasal steroid sprays, or saline sprays. However, for sinusitis related to a bacterial infection, your health care provider will prescribe antibiotic medicines. These are medicines that will help kill the bacteria causing the infection. Rarely, sinusitis is caused by a fungal infection. In these cases, your health care provider will prescribe antifungal medicine. For some cases of chronic sinusitis, surgery is needed. Generally, these are cases in which sinusitis recurs more than 3 times per year, despite other treatments. HOME CARE INSTRUCTIONS  Drink plenty of water. Water helps thin the mucus so your sinuses can drain more easily.  Use a humidifier.  Inhale steam 3-4 times a day (for example, sit in the bathroom with the shower running).  Apply  a warm, moist washcloth to your face 3-4 times a day, or as directed by your health care provider.  Use saline nasal sprays to help moisten and clean your sinuses.  Take medicines only as directed by your health care provider.  If you were prescribed either an  antibiotic or antifungal medicine, finish it all even if you start to feel better. SEEK IMMEDIATE MEDICAL CARE IF:  You have increasing pain or severe headaches.  You have nausea, vomiting, or drowsiness.  You have swelling around your face.  You have vision problems.  You have a stiff neck.  You have difficulty breathing.   This information is not intended to replace advice given to you by your health care provider. Make sure you discuss any questions you have with your health care provider.   Document Released: 08/24/2005 Document Revised: 09/14/2014 Document Reviewed: 09/08/2011 Elsevier Interactive Patient Education [employer].

## 2016-07-15 ENCOUNTER — Other Ambulatory Visit: Payer: Self-pay | Admitting: Internal Medicine

## 2016-07-15 DIAGNOSIS — E785 Hyperlipidemia, unspecified: Secondary | ICD-10-CM

## 2016-07-22 ENCOUNTER — Other Ambulatory Visit: Payer: Self-pay | Admitting: Physician Assistant

## 2016-09-16 ENCOUNTER — Ambulatory Visit (INDEPENDENT_AMBULATORY_CARE_PROVIDER_SITE_OTHER): Payer: Medicare Other | Admitting: Adult Health

## 2016-09-16 ENCOUNTER — Encounter: Payer: Self-pay | Admitting: Adult Health

## 2016-09-16 VITALS — BP 142/82 | HR 74 | Temp 98.3°F | Ht 65.0 in | Wt 195.4 lb

## 2016-09-16 DIAGNOSIS — G4733 Obstructive sleep apnea (adult) (pediatric): Secondary | ICD-10-CM

## 2016-09-16 DIAGNOSIS — E669 Obesity, unspecified: Secondary | ICD-10-CM | POA: Diagnosis not present

## 2016-09-16 DIAGNOSIS — Z9989 Dependence on other enabling machines and devices: Secondary | ICD-10-CM

## 2016-09-16 NOTE — Progress Notes (Signed)
'@Patient'  ID: Kayla Khan, female    DOB: April 05, 1951, 66 y.o.   MRN: [id number]  Chief Complaint  Patient presents with  . Follow-up    OSA     Referring provider: Janith Lima, MD  HPI: 66 yo female followed for mild OSA on CPAP   TEST/Events NPSG 2004:  AHI 10/hr, cpap optimized to 5cm.  09/16/2016 Follow up : OSA  Pt returns for 1 year follow up for OSA . Says she is doing well on CPAP . Feels rested.  Wears each night for around 7 hr .  Download shows excellent compliance and control with AHI at 4/hr . Avg usage at 8hr .  Does think her card not working at times. We discussed getting new SD card for CPAP .    Allergies  Allergen Reactions  . Amoxicillin-Pot Clavulanate   . Latex   . Meperidine Hcl     Immunization History  Administered Date(s) Administered  . DTaP 10/18/2004  . Influenza Split 07/08/2012, 04/16/2016  . Influenza Whole 06/19/2010, 06/30/2011  . Influenza,inj,Quad PF,36+ Mos 07/03/2013  . Influenza-Unspecified 06/07/2014  . Pneumococcal Conjugate-13 03/22/2013  . Pneumococcal Polysaccharide-23 04/28/2007  . Zoster 06/19/2010    Past Medical History:  Diagnosis Date  . Allergic conjunctivitis   . Anemia, iron deficiency   . Angina pectoris   . Arthritis   . Asthma   . 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 Depression with anxiety   . Diverticulosis of colon   . Diverticulosis of colon   . DJD (degenerative joint disease)   . Ejection fraction    70%, catheter 2004 / normal LV function nuclear, 2008 ( no echo data as of December 23, 2010)  . Fatigue   . GERD (gastroesophageal reflux disease)   . HLA B27 positive   . HLD (hyperlipidemia)   . Palpitations   . Sleep apnea   . Thyroid nodule    The patient had a thyroid nodule that was surgically removed in the past.  She has one half of her thyroid and does not take replacement of Taxol and he  was benign    Tobacco History: History  Smoking Status  . Never Smoker  Smokeless Tobacco  . Never Used   Counseling given: Not Answered   Outpatient Encounter Prescriptions as of 09/16/2016  Medication Sig  . Albuterol Sulfate (PROAIR RESPICLICK) 811 (90 BASE) MCG/ACT AEPB Inhale 1 puff into the lungs 4 (four) times daily as needed.  . ALPRAZolam (XANAX) 0.25 MG tablet Take 1 tablet (0.25 mg total) by mouth 2 (two) times daily as needed.  . Ascorbic Acid (VITAMIN C) 1000 MG tablet Take 1,000 mg by mouth daily.    Marland Kitchen aspirin 81 MG tablet Take 81 mg by mouth daily.    . clobetasol (OLUX) 0.05 % topical foam Apply topically 2 (two) times daily.  . cycloSPORINE (RESTASIS) 0.05 % ophthalmic emulsion 1 drop 2 (two) times daily.  . fish oil-omega-3 fatty acids 1000 MG capsule Take 3 g by mouth daily.    Marland Kitchen losartan (COZAAR) 50 MG tablet Take 1 tablet (50 mg total) by mouth daily.  . Multiple Vitamin (MULTIVITAMIN) tablet Take 1 tablet by mouth daily.    . pravastatin (PRAVACHOL) 40 MG tablet take 1 tablet by mouth once daily  . benzonatate (TESSALON) 100 MG capsule Take 1 capsule (100 mg total) by mouth 3 (three) times  daily. (Patient not taking: Reported on 09/16/2016)  . CALCIUM-MAGNESIUM-ZINC PO Take 1 tablet by mouth daily.    . [DISCONTINUED] doxycycline (VIBRA-TABS) 100 MG tablet Take 1 tablet (100 mg total) by mouth 2 (two) times daily. (Patient not taking: Reported on 09/16/2016)   No facility-administered encounter medications on file as of 09/16/2016.      Review of Systems  Constitutional:   No  weight loss, night sweats,  Fevers, chills, fatigue, or  lassitude.  HEENT:   No headaches,  Difficulty swallowing,  Tooth/dental problems, or  Sore throat,                No sneezing, itching, ear ache, nasal congestion, post nasal drip,   CV:  No chest pain,  Orthopnea, PND, swelling in lower extremities, anasarca, dizziness, palpitations, syncope.   GI  No heartburn, indigestion,  abdominal pain, nausea, vomiting, diarrhea, change in bowel habits, loss of appetite, bloody stools.   Resp: No shortness of breath with exertion or at rest.  No excess mucus, no productive cough,  No non-productive cough,  No coughing up of blood.  No change in color of mucus.  No wheezing.  No chest wall deformity  Skin: no rash or lesions.  GU: no dysuria, change in color of urine, no urgency or frequency.  No flank pain, no hematuria   MS:  No joint pain or swelling.  No decreased range of motion.  No back pain.    Physical Exam  BP (!) 142/82 (BP Location: Left Arm, Cuff Size: Normal)   Pulse 74   Temp 98.3 F (36.8 C) (Oral)   Ht '5\' 5"'  (1.651 m)   Wt 195 lb 6.4 oz (88.6 kg)   SpO2 96%   BMI 32.52 kg/m   GEN: A/Ox3; pleasant , NAD, well nourished    HEENT:  [place]/AT,  EACs-clear, TMs-wnl, NOSE-clear, THROAT-clear, no lesions, no postnasal drip or exudate noted. Class 2-3 MP airway   NECK:  Supple w/ fair ROM; no JVD; normal carotid impulses w/o bruits; no thyromegaly or nodules palpated; no lymphadenopathy.    RESP  Clear  P & A; w/o, wheezes/ rales/ or rhonchi. no accessory muscle use, no dullness to percussion  CARD:  RRR, no m/r/g, no peripheral edema, pulses intact, no cyanosis or clubbing.  GI:   Soft & nt; nml bowel sounds; no organomegaly or masses detected.   Musco: Warm bil, no deformities or joint swelling noted.   Neuro: alert, no focal deficits noted.    Skin: Warm, no lesions or rashes  Psych:  No change in mood or affect. No depression or anxiety.  No memory loss.  Lab Results:  CBC    Component Value Date/Time   WBC 7.2 11/28/2015 1544   RBC 4.57 11/28/2015 1544   HGB 14.3 11/28/2015 1544   HCT 42.0 11/28/2015 1544   PLT 265.0 11/28/2015 1544   MCV 92.1 11/28/2015 1544   MCHC 34.1 11/28/2015 1544   RDW 13.3 11/28/2015 1544   LYMPHSABS 2.6 11/28/2015 1544   MONOABS 0.5 11/28/2015 1544   EOSABS 0.2 11/28/2015 1544   BASOSABS 0.0 11/28/2015  1544    BMET    Component Value Date/Time   NA 142 11/28/2015 1544   K 3.9 11/28/2015 1544   CL 103 11/28/2015 1544   CO2 32 11/28/2015 1544   GLUCOSE 104 (H) 11/28/2015 1544   BUN 23 11/28/2015 1544   CREATININE 0.85 11/28/2015 1544   CALCIUM 10.2 11/28/2015 1544  GFRNONAA 84.60 06/19/2010 0937   GFRAA 94 11/16/2008 1622    BNP No results found for: BNP  ProBNP No results found for: PROBNP  Imaging: No results found.   Assessment & Plan:   No problem-specific Assessment & Plan notes found for this encounter.     Rexene Edison, NP 09/16/2016

## 2016-09-16 NOTE — Assessment & Plan Note (Addendum)
Doing well on CPAP At bedtime   Plan  Patient Instructions  Continue on CPAP At bedtime.  Keep up good work .  Order for CPAP new SD card.  Stay active , work on weight loss  Do not drive if sleepy  follow up Dr. Elsworth Soho  In 1 year and As needed

## 2016-09-16 NOTE — Patient Instructions (Addendum)
Continue on CPAP At bedtime.  Keep up good work .  Order for CPAP new SD card.  Stay active , work on weight loss  Do not drive if sleepy  follow up Dr. Elsworth Soho  In 1 year and As needed

## 2016-09-16 NOTE — Assessment & Plan Note (Addendum)
Work on weight loss.

## 2016-09-17 NOTE — Progress Notes (Signed)
Reviewed & agree with plan  

## 2016-11-09 ENCOUNTER — Encounter: Payer: Medicare Other | Admitting: Cardiology

## 2016-11-12 ENCOUNTER — Ambulatory Visit (INDEPENDENT_AMBULATORY_CARE_PROVIDER_SITE_OTHER): Payer: Medicare Other | Admitting: Cardiology

## 2016-11-12 ENCOUNTER — Encounter: Payer: Self-pay | Admitting: Cardiology

## 2016-11-12 VITALS — BP 124/80 | HR 70 | Ht 65.0 in | Wt 192.0 lb

## 2016-11-12 DIAGNOSIS — E785 Hyperlipidemia, unspecified: Secondary | ICD-10-CM | POA: Diagnosis not present

## 2016-11-12 DIAGNOSIS — I2510 Atherosclerotic heart disease of native coronary artery without angina pectoris: Secondary | ICD-10-CM

## 2016-11-12 DIAGNOSIS — I10 Essential (primary) hypertension: Secondary | ICD-10-CM | POA: Diagnosis not present

## 2016-11-12 MED ORDER — PRAVASTATIN SODIUM 40 MG PO TABS: 40.0000 mg | ORAL_TABLET | Freq: Every day | ORAL | 3 refills | Status: DC

## 2016-11-12 MED ORDER — LOSARTAN POTASSIUM 50 MG PO TABS: 50.0000 mg | ORAL_TABLET | Freq: Every day | ORAL | 3 refills | Status: DC

## 2016-11-12 NOTE — Progress Notes (Signed)
Cardiology Office Note    Date:  11/14/2016   ID:  Kayla Khan, DOB 09-26-1950, MRN [id number]  PCP:  Scarlette Calico, MD  Cardiologist:  Ena Dawley, MD   Reason for visit: 1 year follow up  History of Present Illness:  Kayla Khan is a 66 y.o. female who was previously followed by Dr Wendelyn Breslow for chest discomfort. Coronary arteries were normal by catheterization in 2004. 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 Nuclear scan in December, 2008 was normal. In the past few years she started taking arginine. She feels this has helped her substantially. She is not having any significant pain.  See states that she has been stable, she has been experimenting with different OTC medications and states that L-citruline and L arginine made a significant difference for her. She only feels occasional exertional SOB or CP, no CP at rest, no palpitations, falls, syncope. No LE edema. She is tolerating pravastatin well.   Past Medical History:  Diagnosis Date  . Allergic conjunctivitis   . Anemia, iron deficiency   . Angina pectoris   . Arthritis   . Asthma   . 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 Depression with anxiety   . Diverticulosis of colon   . Diverticulosis of colon   . DJD (degenerative joint disease)   . Ejection fraction    70%, catheter 2004 / normal LV function nuclear, 2008 ( no echo data as of December 23, 2010)  . Fatigue   . GERD (gastroesophageal reflux disease)   . HLA B27 positive   . HLD (hyperlipidemia)   . Palpitations   . Sleep apnea   . Thyroid nodule    The patient had a thyroid nodule that was surgically removed in the past.  She has one half of her thyroid and does not take replacement of Taxol and he was benign    Past Surgical History:  Procedure Laterality Date  . APPENDECTOMY    . BREAST BIOPSY    . CESAREAN SECTION     x2  . GANGLION CYST  EXCISION    . THYROIDECTOMY, PARTIAL     left  . TUBAL LIGATION    . VESICOVAGINAL FISTULA CLOSURE W/ TAH      Current Medications: Outpatient Medications Prior to Visit  Medication Sig Dispense Refill  . Albuterol Sulfate (PROAIR RESPICLICK) 465 (90 BASE) MCG/ACT AEPB Inhale 1 puff into the lungs 4 (four) times daily as needed. 1 each 11  . ALPRAZolam (XANAX) 0.25 MG tablet Take 1 tablet (0.25 mg total) by mouth 2 (two) times daily as needed. 35 tablet 2  . Ascorbic Acid (VITAMIN C) 1000 MG tablet Take 1,000 mg by mouth daily.      Marland Kitchen aspirin 81 MG tablet Take 81 mg by mouth daily.      . clobetasol (OLUX) 0.05 % topical foam Apply topically 2 (two) times daily. 50 g 5  . fish oil-omega-3 fatty acids 1000 MG capsule Take 3 g by mouth daily.      . Multiple Vitamin (MULTIVITAMIN) tablet Take 1 tablet by mouth daily.      Marland Kitchen losartan (COZAAR) 50 MG tablet Take 1 tablet (50 mg total) by mouth daily. 90 tablet 3  . pravastatin (PRAVACHOL) 40 MG tablet take 1 tablet by mouth once daily 90 tablet 0  . benzonatate (TESSALON) 100 MG capsule Take 1 capsule (100  mg total) by mouth 3 (three) times daily. (Patient not taking: Reported on 09/16/2016) 20 capsule 0  . CALCIUM-MAGNESIUM-ZINC PO Take 1 tablet by mouth daily.      . cycloSPORINE (RESTASIS) 0.05 % ophthalmic emulsion 1 drop 2 (two) times daily.     No facility-administered medications prior to visit.      Allergies:   Amoxicillin-pot clavulanate; Latex; and Meperidine hcl   Social History   Social History  . Marital status: Divorced    Spouse name: N/A  . Number of children: N/A  . Years of education: N/A   Occupational History  . teacher    Social History Main Topics  . Smoking status: Never Smoker  . Smokeless tobacco: Never Used  . Alcohol use No  . Drug use: No  . Sexual activity: Not Currently   Other Topics Concern  . None   Social History Narrative  . None    Family History:  The patient's family history  includes Aneurysm (age of onset: 54) in her father; Cancer in her brother and mother; Heart attack in her maternal grandfather; Hypertension in her father; Stroke in her maternal grandmother and paternal grandfather.   ROS:   Please see the history of present illness.    ROS All other systems reviewed and are negative.  PHYSICAL EXAM:   VS:  BP 124/80   Pulse 70   Ht '5\' 5"'  (1.651 m)   Wt 192 lb (87.1 kg)   BMI 31.95 kg/m    GEN: Well nourished, well developed, in no acute distress  HEENT: normal  Neck: no JVD, carotid bruits, or masses Cardiac: RRR; no murmurs, rubs, or gallops,no edema  Respiratory:  clear to auscultation bilaterally, normal work of breathing GI: soft, nontender, nondistended, + BS MS: no deformity or atrophy  Skin: warm and dry, no rash Neuro:  Alert and Oriented x 3, Strength and sensation are intact Psych: euthymic mood, full affect  Wt Readings from Last 3 Encounters:  11/12/16 192 lb (87.1 kg)  09/16/16 195 lb 6.4 oz (88.6 kg)  06/25/16 194 lb 6.4 oz (88.2 kg)    Studies/Labs Reviewed:   EKG:  EKG is ordered today.  The ekg ordered today demonstrates SR, normal ECG, unchanged from prior.  Recent Labs: 11/28/2015: BUN 23; Creatinine, Ser 0.85; Hemoglobin 14.3; Platelets 265.0; Potassium 3.9; Sodium 142; TSH 1.85   Lipid Panel    Component Value Date/Time   CHOL 161 11/28/2015 1544   TRIG 246.0 (H) 11/28/2015 1544   HDL 43.10 11/28/2015 1544   CHOLHDL 4 11/28/2015 1544   VLDL 49.2 (H) 11/28/2015 1544   LDLCALC 84 01/23/2015 1055   LDLDIRECT 87.0 11/28/2015 1544    Additional studies/ records that were reviewed today include:      ASSESSMENT:    1. Coronary artery disease involving native coronary artery of native heart without angina pectoris   2. Hyperlipidemia with target LDL less than 130   3. Essential hypertension      PLAN:  In order of problems listed above:  1. The patient is stable with minimal stable angina, continue the  same management. 2. BP controlled. 3. TG elevated, however on pravastatin and fish oil already, we will continue.    Medication Adjustments/Labs and Tests Ordered: Current medicines are reviewed at length with the patient today.  Concerns regarding medicines are outlined above.  Medication changes, Labs and Tests ordered today are listed in the Patient Instructions below. Patient Instructions  Medication Instructions:  Your physician recommends that you continue on your current medications as directed. Please refer to the Current Medication list given to you today.     Follow-Up:  Your physician wants you to follow-up in: [place] will receive a reminder letter in the mail two months in advance. If you don't receive a letter, please call our office to schedule the follow-up appointment.      If you need a refill on your cardiac medications before your next appointment, please call your pharmacy.      Signed, Ena Dawley, MD  11/14/2016 8:03 AM    [address], [place], [place]  [address] Phone: [phone]; Fax: [phone]

## 2016-11-12 NOTE — Patient Instructions (Signed)

## 2016-12-14 ENCOUNTER — Ambulatory Visit (INDEPENDENT_AMBULATORY_CARE_PROVIDER_SITE_OTHER): Payer: Medicare Other | Admitting: Internal Medicine

## 2016-12-14 ENCOUNTER — Encounter: Payer: Self-pay | Admitting: Internal Medicine

## 2016-12-14 VITALS — BP 162/94 | HR 82 | Temp 98.2°F | Resp 16 | Ht 65.0 in | Wt 193.0 lb

## 2016-12-14 DIAGNOSIS — Z0000 Encounter for general adult medical examination without abnormal findings: Secondary | ICD-10-CM

## 2016-12-14 DIAGNOSIS — R739 Hyperglycemia, unspecified: Secondary | ICD-10-CM

## 2016-12-14 DIAGNOSIS — E785 Hyperlipidemia, unspecified: Secondary | ICD-10-CM

## 2016-12-14 DIAGNOSIS — I10 Essential (primary) hypertension: Secondary | ICD-10-CM

## 2016-12-14 DIAGNOSIS — Z23 Encounter for immunization: Secondary | ICD-10-CM | POA: Diagnosis not present

## 2016-12-14 DIAGNOSIS — D508 Other iron deficiency anemias: Secondary | ICD-10-CM | POA: Diagnosis not present

## 2016-12-14 DIAGNOSIS — Z1231 Encounter for screening mammogram for malignant neoplasm of breast: Secondary | ICD-10-CM

## 2016-12-14 MED ORDER — ZOSTER VAC RECOMB ADJUVANTED 50 MCG/0.5ML IM SUSR: 0.5000 mL | Freq: Once | INTRAMUSCULAR | 1 refills | Status: AC

## 2016-12-14 MED ORDER — TELMISARTAN 40 MG PO TABS: 40.0000 mg | ORAL_TABLET | Freq: Every day | ORAL | 1 refills | Status: DC

## 2016-12-14 MED ORDER — CHLORTHALIDONE 25 MG PO TABS: 25.0000 mg | ORAL_TABLET | Freq: Every day | ORAL | 1 refills | Status: DC

## 2016-12-14 NOTE — Patient Instructions (Signed)
Health Maintenance, Female Adopting a healthy lifestyle and getting preventive care can go a long way to promote health and wellness. Talk with your health care provider about what schedule of regular examinations is right for you. This is a good chance for you to check in with your provider about disease prevention and staying healthy. In between checkups, there are plenty of things you can do on your own. Experts have done a lot of research about which lifestyle changes and preventive measures are most likely to keep you healthy. Ask your health care provider for more information. Weight and diet Eat a healthy diet  Be sure to include plenty of vegetables, fruits, low-fat dairy products, and lean protein.  Do not eat a lot of foods high in solid fats, added sugars, or salt.  Get regular exercise. This is one of the most important things you can do for your health.  Most adults should exercise for at least 150 minutes each week. The exercise should increase your heart rate and make you sweat (moderate-intensity exercise).  Most adults should also do strengthening exercises at least twice a week. This is in addition to the moderate-intensity exercise. Maintain a healthy weight  Body mass index (BMI) is a measurement that can be used to identify possible weight problems. It estimates body fat based on height and weight. Your health care provider can help determine your BMI and help you achieve or maintain a healthy weight.  For females 76 years of age and older:  A BMI below 18.5 is considered underweight.  A BMI of 18.5 to 24.9 is normal.  A BMI of 25 to 29.9 is considered overweight.  A BMI of 30 and above is considered obese. Watch levels of cholesterol and blood lipids  You should start having your blood tested for lipids and cholesterol at 68 years of age, then have this test every 5 years.  You may need to have your cholesterol levels checked more often if:  Your lipid or  cholesterol levels are high.  You are older than 68 years of age.  You are at high risk for heart disease. Cancer screening Lung Cancer  Lung cancer screening is recommended for adults 64-42 years old who are at high risk for lung cancer because of a history of smoking.  A yearly low-dose CT scan of the lungs is recommended for people who:  Currently smoke.  Have quit within the past 15 years.  Have at least a 30-pack-year history of smoking. A pack year is smoking an average of one pack of cigarettes a day for 1 year.  Yearly screening should continue until it has been 15 years since you quit.  Yearly screening should stop if you develop a health problem that would prevent you from having lung cancer treatment. Breast Cancer  Practice breast self-awareness. This means understanding how your breasts normally appear and feel.  It also means doing regular breast self-exams. Let your health care provider know about any changes, no matter how small.  If you are in your 20s or 30s, you should have a clinical breast exam (CBE) by a health care provider every 1-3 years as part of a regular health exam.  If you are 34 or older, have a CBE every year. Also consider having a breast X-ray (mammogram) every year.  If you have a family history of breast cancer, talk to your health care provider about genetic screening.  If you are at high risk for breast cancer, talk  to your health care provider about having an MRI and a mammogram every year.  Breast cancer gene (BRCA) assessment is recommended for women who have family members with BRCA-related cancers. BRCA-related cancers include:  Breast.  Ovarian.  Tubal.  Peritoneal cancers.  Results of the assessment will determine the need for genetic counseling and BRCA1 and BRCA2 testing. Cervical Cancer  Your health care provider may recommend that you be screened regularly for cancer of the pelvic organs (ovaries, uterus, and vagina).  This screening involves a pelvic examination, including checking for microscopic changes to the surface of your cervix (Pap test). You may be encouraged to have this screening done every 3 years, beginning at age 24.  For women ages 66-65, health care providers may recommend pelvic exams and Pap testing every 3 years, or they may recommend the Pap and pelvic exam, combined with testing for human papilloma virus (HPV), every 5 years. Some types of HPV increase your risk of cervical cancer. Testing for HPV may also be done on women of any age with unclear Pap test results.  Other health care providers may not recommend any screening for nonpregnant women who are considered low risk for pelvic cancer and who do not have symptoms. Ask your health care provider if a screening pelvic exam is right for you.  If you have had past treatment for cervical cancer or a condition that could lead to cancer, you need Pap tests and screening for cancer for at least 20 years after your treatment. If Pap tests have been discontinued, your risk factors (such as having a new sexual partner) need to be reassessed to determine if screening should resume. Some women have medical problems that increase the chance of getting cervical cancer. In these cases, your health care provider may recommend more frequent screening and Pap tests. Colorectal Cancer  This type of cancer can be detected and often prevented.  Routine colorectal cancer screening usually begins at 68 years of age and continues through 68 years of age.  Your health care provider may recommend screening at an earlier age if you have risk factors for colon cancer.  Your health care provider may also recommend using home test kits to check for hidden blood in the stool.  A small camera at the end of a tube can be used to examine your colon directly (sigmoidoscopy or colonoscopy). This is done to check for the earliest forms of colorectal cancer.  Routine  screening usually begins at age 41.  Direct examination of the colon should be repeated every 5-10 years through 68 years of age. However, you may need to be screened more often if early forms of precancerous polyps or small growths are found. Skin Cancer  Check your skin from head to toe regularly.  Tell your health care provider about any new moles or changes in moles, especially if there is a change in a mole's shape or color.  Also tell your health care provider if you have a mole that is larger than the size of a pencil eraser.  Always use sunscreen. Apply sunscreen liberally and repeatedly throughout the day.  Protect yourself by wearing long sleeves, pants, a wide-brimmed hat, and sunglasses whenever you are outside. Heart disease, diabetes, and high blood pressure  High blood pressure causes heart disease and increases the risk of stroke. High blood pressure is more likely to develop in:  People who have blood pressure in the high end of the normal range (130-139/85-89 mm Hg).  People who are overweight or obese.  People who are African American.  If you are 59-24 years of age, have your blood pressure checked every 3-5 years. If you are 34 years of age or older, have your blood pressure checked every year. You should have your blood pressure measured twice-once when you are at a hospital or clinic, and once when you are not at a hospital or clinic. Record the average of the two measurements. To check your blood pressure when you are not at a hospital or clinic, you can use:  An automated blood pressure machine at a pharmacy.  A home blood pressure monitor.  If you are between 29 years and 60 years old, ask your health care provider if you should take aspirin to prevent strokes.  Have regular diabetes screenings. This involves taking a blood sample to check your fasting blood sugar level.  If you are at a normal weight and have a low risk for diabetes, have this test once  every three years after 68 years of age.  If you are overweight and have a high risk for diabetes, consider being tested at a younger age or more often. Preventing infection Hepatitis B  If you have a higher risk for hepatitis B, you should be screened for this virus. You are considered at high risk for hepatitis B if:  You were born in a country where hepatitis B is common. Ask your health care provider which countries are considered high risk.  Your parents were born in a high-risk country, and you have not been immunized against hepatitis B (hepatitis B vaccine).  You have HIV or AIDS.  You use needles to inject street drugs.  You live with someone who has hepatitis B.  You have had sex with someone who has hepatitis B.  You get hemodialysis treatment.  You take certain medicines for conditions, including cancer, organ transplantation, and autoimmune conditions. Hepatitis C  Blood testing is recommended for:  Everyone born from [birth year] through 1967.  Anyone with known risk factors for hepatitis C. Sexually transmitted infections (STIs)  You should be screened for sexually transmitted infections (STIs) including gonorrhea and chlamydia if:  You are sexually active and are younger than 68 years of age.  You are older than 68 years of age and your health care provider tells you that you are at risk for this type of infection.  Your sexual activity has changed since you were last screened and you are at an increased risk for chlamydia or gonorrhea. Ask your health care provider if you are at risk.  If you do not have HIV, but are at risk, it may be recommended that you take a prescription medicine daily to prevent HIV infection. This is called pre-exposure prophylaxis (PrEP). You are considered at risk if:  You are sexually active and do not regularly use condoms or know the HIV status of your partner(s).  You take drugs by injection.  You are sexually active with a partner  who has HIV. Talk with your health care provider about whether you are at high risk of being infected with HIV. If you choose to begin PrEP, you should first be tested for HIV. You should then be tested every 3 months for as long as you are taking PrEP. Pregnancy  If you are premenopausal and you may become pregnant, ask your health care provider about preconception counseling.  If you may become pregnant, take 400 to 800 micrograms (mcg) of folic acid  every day.  If you want to prevent pregnancy, talk to your health care provider about birth control (contraception). Osteoporosis and menopause  Osteoporosis is a disease in which the bones lose minerals and strength with aging. This can result in serious bone fractures. Your risk for osteoporosis can be identified using a bone density scan.  If you are 4 years of age or older, or if you are at risk for osteoporosis and fractures, ask your health care provider if you should be screened.  Ask your health care provider whether you should take a calcium or vitamin D supplement to lower your risk for osteoporosis.  Menopause may have certain physical symptoms and risks.  Hormone replacement therapy may reduce some of these symptoms and risks. Talk to your health care provider about whether hormone replacement therapy is right for you. Follow these instructions at home:  Schedule regular health, dental, and eye exams.  Stay current with your immunizations.  Do not use any tobacco products including cigarettes, chewing tobacco, or electronic cigarettes.  If you are pregnant, do not drink alcohol.  If you are breastfeeding, limit how much and how often you drink alcohol.  Limit alcohol intake to no more than 1 drink per day for nonpregnant women. One drink equals 12 ounces of beer, 5 ounces of wine, or 1 ounces of hard liquor.  Do not use street drugs.  Do not share needles.  Ask your health care provider for help if you need support  or information about quitting drugs.  Tell your health care provider if you often feel depressed.  Tell your health care provider if you have ever been abused or do not feel safe at home. This information is not intended to replace advice given to you by your health care provider. Make sure you discuss any questions you have with your health care provider. Document Released: 03/09/2011 Document Revised: 01/30/2016 Document Reviewed: 05/28/2015 Elsevier Interactive Patient Education  2017 [employer].

## 2016-12-14 NOTE — Progress Notes (Signed)
Pre visit review using our clinic review tool, if applicable. No additional management support is needed unless otherwise documented below in the visit note. 

## 2016-12-14 NOTE — Progress Notes (Signed)
Subjective:  Patient ID: Kayla Khan, female    DOB: 1950/12/13  Age: 66 y.o. MRN: [id number]  CC: Annual Exam; Hypertension; Hyperlipidemia; and Anemia   HPI Judythe L Maines presents for a CPX.  She is concerned that her blood pressure is not well controlled. She has had no episodes of headache, blurred vision, CP, DOE, palpitations, edema, or fatigue. She does occasionally take anti-inflammatories but she does not take decongestants or stimulants. She has been compliant with losartan. She tells me her sleep apnea is adequately treated.  Past Medical History:  Diagnosis Date  . Allergic conjunctivitis   . Anemia, iron deficiency   . Angina pectoris   . Arthritis   . Asthma   . 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 Depression with anxiety   . Diverticulosis of colon   . Diverticulosis of colon   . DJD (degenerative joint disease)   . Ejection fraction    70%, catheter 2004 / normal LV function nuclear, 2008 ( no echo data as of December 23, 2010)  . Fatigue   . GERD (gastroesophageal reflux disease)   . HLA B27 positive   . HLD (hyperlipidemia)   . Palpitations   . Sleep apnea   . Thyroid nodule    The patient had a thyroid nodule that was surgically removed in the past.  She has one half of her thyroid and does not take replacement of Taxol and he was benign   Past Surgical History:  Procedure Laterality Date  . APPENDECTOMY    . BREAST BIOPSY    . CESAREAN SECTION     x2  . GANGLION CYST EXCISION    . THYROIDECTOMY, PARTIAL     left  . TUBAL LIGATION    . VESICOVAGINAL FISTULA CLOSURE W/ TAH      reports that she has never smoked. She has never used smokeless tobacco. She reports that she does not drink alcohol or use drugs. family history includes Aneurysm (age of onset: 40) in her father; Cancer in her brother and mother; Heart attack in her maternal grandfather;  Hypertension in her father; Stroke in her maternal grandmother and paternal grandfather. Allergies  Allergen Reactions  . Amoxicillin-Pot Clavulanate   . Latex   . Meperidine Hcl     Outpatient Medications Prior to Visit  Medication Sig Dispense Refill  . Albuterol Sulfate (PROAIR RESPICLICK) 469 (90 BASE) MCG/ACT AEPB Inhale 1 puff into the lungs 4 (four) times daily as needed. 1 each 11  . ALPRAZolam (XANAX) 0.25 MG tablet Take 1 tablet (0.25 mg total) by mouth 2 (two) times daily as needed. 35 tablet 2  . Arginine 1000 MG TABS Take 2,000 mg by mouth 2 (two) times daily.    . Ascorbic Acid (VITAMIN C) 1000 MG tablet Take 1,000 mg by mouth daily.      Marland Kitchen aspirin 81 MG tablet Take 81 mg by mouth daily.      Marland Kitchen CITRULLINE PO Take 500 mg by mouth 2 (two) times daily.    . clobetasol (OLUX) 0.05 % topical foam Apply topically 2 (two) times daily. 50 g 5  . fish oil-omega-3 fatty acids 1000 MG capsule Take 3 g by mouth daily.      . meloxicam (MOBIC) 15 MG tablet Take 15 mg by mouth daily as needed.  0  . pravastatin (PRAVACHOL) 40 MG tablet Take 1 tablet (40 mg  total) by mouth daily. 90 tablet 3  . losartan (COZAAR) 50 MG tablet Take 1 tablet (50 mg total) by mouth daily. 90 tablet 3  . Multiple Vitamin (MULTIVITAMIN) tablet Take 1 tablet by mouth daily.       No facility-administered medications prior to visit.     ROS Review of Systems  Constitutional: Negative.  Negative for appetite change, chills, diaphoresis, fatigue and unexpected weight change.  HENT: Negative.   Eyes: Negative for visual disturbance.  Respiratory: Positive for apnea. Negative for cough, chest tightness, shortness of breath and wheezing.   Cardiovascular: Negative for chest pain, palpitations and leg swelling.  Gastrointestinal: Negative.  Negative for abdominal pain, blood in stool, constipation, diarrhea, nausea and vomiting.  Endocrine: Negative.  Negative for cold intolerance, heat intolerance, polydipsia,  polyphagia and polyuria.  Genitourinary: Negative.  Negative for difficulty urinating.  Musculoskeletal: Positive for arthralgias. Negative for back pain, myalgias and neck pain.  Allergic/Immunologic: Negative.   Neurological: Negative.  Negative for dizziness, weakness and light-headedness.  Hematological: Negative for adenopathy. Does not bruise/bleed easily.  Psychiatric/Behavioral: Negative.     Objective:  BP (!) 162/94 (BP Location: Left Arm, Patient Position: Sitting, Cuff Size: Normal)   Pulse 82   Temp 98.2 F (36.8 C) (Oral)   Resp 16   Ht '5\' 5"'  (1.651 m)   Wt 193 lb (87.5 kg)   SpO2 98%   BMI 32.12 kg/m   BP Readings from Last 3 Encounters:  12/14/16 (!) 162/94  11/12/16 124/80  09/16/16 (!) 142/82    Wt Readings from Last 3 Encounters:  12/14/16 193 lb (87.5 kg)  11/12/16 192 lb (87.1 kg)  09/16/16 195 lb 6.4 oz (88.6 kg)    Physical Exam  Constitutional: She is oriented to person, place, and time. She appears well-developed and well-nourished. No distress.  HENT:  Mouth/Throat: Oropharynx is clear and moist. No oropharyngeal exudate.  Eyes: Conjunctivae are normal. Right eye exhibits no discharge. Left eye exhibits no discharge. No scleral icterus.  Neck: Normal range of motion. Neck supple. No JVD present. No tracheal deviation present. No thyromegaly present.  Cardiovascular: Normal rate, regular rhythm and intact distal pulses.  Exam reveals no gallop and no friction rub.   No murmur heard. Pulmonary/Chest: Effort normal and breath sounds normal. No stridor. No respiratory distress. She has no wheezes. She has no rales. She exhibits no tenderness.  Abdominal: Soft. Bowel sounds are normal. She exhibits no distension and no mass. There is no tenderness. There is no rebound and no guarding.  Musculoskeletal: Normal range of motion. She exhibits no edema, tenderness or deformity.  Lymphadenopathy:    She has no cervical adenopathy.  Neurological: She is  oriented to person, place, and time.  Skin: Skin is warm and dry. No rash noted. She is not diaphoretic. No erythema. No pallor.  Psychiatric: She has a normal mood and affect. Her behavior is normal. Judgment and thought content normal.  Vitals reviewed.   Lab Results  Component Value Date   WBC 6.8 12/15/2016   HGB 14.5 12/15/2016   HCT 42.1 12/15/2016   PLT 239.0 12/15/2016   GLUCOSE 113 (H) 12/15/2016   CHOL 157 12/15/2016   TRIG 162.0 (H) 12/15/2016   HDL 38.00 (L) 12/15/2016   LDLDIRECT 92.0 12/15/2016   LDLCALC 86 12/15/2016   ALT 23 12/15/2016   AST 18 12/15/2016   NA 141 12/15/2016   K 4.3 12/15/2016   CL 105 12/15/2016   CREATININE 0.80 12/15/2016  BUN 24 (H) 12/15/2016   CO2 30 12/15/2016   TSH 2.00 12/15/2016   HGBA1C 6.0 12/15/2016    Mr Lumbar Spine Wo Contrast  Result Date: 05/08/2015 CLINICAL DATA:  Right-sided radiculitis.  Right-sided back pain EXAM: MRI LUMBAR SPINE WITHOUT CONTRAST TECHNIQUE: Multiplanar, multisequence MR imaging of the lumbar spine was performed. No intravenous contrast was administered. COMPARISON:  Lumbar MRI 02/12/2006 FINDINGS: Normal lumbar alignment. Negative for fracture or mass lesion. 15 mm parapelvic cyst left kidney. Conus medullaris normal and terminates at L1-2. L1-2:  Negative L2-3: Diffuse bulging of the disc. Bilateral facet hypertrophy causing mild spinal stenosis. Progression of degenerative change and stenosis since 2007 L3-4: Diffuse disc bulging and mild endplate osteophyte formation. Bilateral facet hypertrophy. Moderate spinal stenosis. Progression of degenerative changes stenosis since the prior MRI L4-5: Diffuse disc bulging. Superimposed central disc protrusion. Bilateral facet hypertrophy. Moderate to severe spinal stenosis has progressed in the interval. Subarticular stenosis bilaterally with expected impingement of the L5 nerve root bilaterally. L5-S1: Diffuse disc bulging more prominent on the left than the right.  Mild spinal stenosis. Mild left S1 nerve root impingement. IMPRESSION: L2-3 mild spinal stenosis has progressed since 2007 L3-4 moderate spinal stenosis has progressed since prior study Moderate to severe spinal stenosis L4-5 has progressed. There is disc bulging and central disc protrusion. Expected impingement of the L5 nerve root bilaterally. Mild spinal stenosis L5-S1 with mild impingement of the left S1 nerve root. Electronically Signed   By: Franchot Gallo M.D.   On: 05/08/2015 07:08    Assessment & Plan:   Dareth was seen today for annual exam, hypertension, hyperlipidemia and anemia.  Diagnoses and all orders for this visit:  Essential hypertension- her blood pressure is not adequately well controlled so I've asked her to upgrade to a more potent ARB and will add a thiazide diuretic. -     telmisartan (MICARDIS) 40 MG tablet; Take 1 tablet (40 mg total) by mouth daily. -     chlorthalidone (HYGROTON) 25 MG tablet; Take 1 tablet (25 mg total) by mouth daily. -     CBC w/Diff; Future -     Hepatic function panel; Future  Other iron deficiency anemia- improvement noted -     CBC w/Diff; Future  Hyperglycemia- her A1c is 6.0%, she is prediabetic but medications are not needed to treat this. -     Basic Metabolic Panel (BMET); Future -     HgB A1c; Future  Hyperlipidemia with target LDL less than 130- she has achieved her LDL goal is doing well on the statin. -     LDL cholesterol, direct; Future -     Lipid Profile; Future  Visit for screening mammogram -     MM DIGITAL SCREENING BILATERAL; Future  Routine general medical examination at a health care facility -     Zoster Vac Recomb Adjuvanted [facility]) injection; Inject 0.5 mLs into the muscle once. -     TSH; Future  Need for diphtheria-tetanus-pertussis (Tdap) vaccine -     Tdap vaccine greater than or equal to 7yo IM  Need for Streptococcus pneumoniae vaccination -     Pneumococcal polysaccharide vaccine 23-valent greater  than or equal to 2yo subcutaneous/IM   I have discontinued Ms. Saye's multivitamin and losartan. I am also having her start on telmisartan, chlorthalidone, and Zoster Vac Recomb Adjuvanted. Additionally, I am having her maintain her aspirin, fish oil-omega-3 fatty acids, vitamin C, Albuterol Sulfate, ALPRAZolam, clobetasol, meloxicam, Arginine, CITRULLINE PO, pravastatin, and  cholecalciferol.  Meds ordered this encounter  Medications  . cholecalciferol (VITAMIN D) 1000 units tablet    Sig: Take 5,000 Units by mouth daily.  Marland Kitchen telmisartan (MICARDIS) 40 MG tablet    Sig: Take 1 tablet (40 mg total) by mouth daily.    Dispense:  90 tablet    Refill:  1  . chlorthalidone (HYGROTON) 25 MG tablet    Sig: Take 1 tablet (25 mg total) by mouth daily.    Dispense:  90 tablet    Refill:  1  . Zoster Vac Recomb Adjuvanted [facility]) injection    Sig: Inject 0.5 mLs into the muscle once.    Dispense:  1 each    Refill:  1   See AVS for instructions about healthy living and anticipatory guidance.  Follow-up: Return in about 6 weeks (around 01/25/2017).  Scarlette Calico, MD

## 2016-12-15 ENCOUNTER — Encounter: Payer: Self-pay | Admitting: Internal Medicine

## 2016-12-15 ENCOUNTER — Other Ambulatory Visit (INDEPENDENT_AMBULATORY_CARE_PROVIDER_SITE_OTHER): Payer: Medicare Other

## 2016-12-15 DIAGNOSIS — Z0000 Encounter for general adult medical examination without abnormal findings: Secondary | ICD-10-CM

## 2016-12-15 DIAGNOSIS — I10 Essential (primary) hypertension: Secondary | ICD-10-CM

## 2016-12-15 DIAGNOSIS — E785 Hyperlipidemia, unspecified: Secondary | ICD-10-CM | POA: Diagnosis not present

## 2016-12-15 DIAGNOSIS — D508 Other iron deficiency anemias: Secondary | ICD-10-CM

## 2016-12-15 DIAGNOSIS — R739 Hyperglycemia, unspecified: Secondary | ICD-10-CM | POA: Diagnosis not present

## 2016-12-15 LAB — BASIC METABOLIC PANEL
BUN: 24 mg/dL — AB (ref 6–23)
CO2: 30 meq/L (ref 19–32)
Calcium: 9.8 mg/dL (ref 8.4–10.5)
Chloride: 105 mEq/L (ref 96–112)
Creatinine, Ser: 0.8 mg/dL (ref 0.40–1.20)
GFR: 75.77 mL/min (ref >60.00)
GLUCOSE: 113 mg/dL — AB (ref 70–99)
Potassium: 4.3 mEq/L (ref 3.5–5.1)
Sodium: 141 mEq/L (ref 135–145)

## 2016-12-15 LAB — HEPATIC FUNCTION PANEL
ALT: 23 U/L (ref 0–35)
AST: 18 U/L (ref 0–37)
Albumin: 4.2 g/dL (ref 3.5–5.2)
Alkaline Phosphatase: 70 U/L (ref 39–117)
BILIRUBIN TOTAL: 0.6 mg/dL (ref 0.2–1.2)
Bilirubin, Direct: 0.1 mg/dL (ref 0.0–0.3)
TOTAL PROTEIN: 7.4 g/dL (ref 6.0–8.3)

## 2016-12-15 LAB — CBC WITH DIFFERENTIAL/PLATELET
BASOS ABS: 0.1 10*3/uL (ref 0.0–0.1)
Basophils Relative: 0.7 % (ref 0.0–3.0)
EOS ABS: 0.1 10*3/uL (ref 0.0–0.7)
Eosinophils Relative: 1.9 % (ref 0.0–5.0)
HEMATOCRIT: 42.1 % (ref 36.0–46.0)
HEMOGLOBIN: 14.5 g/dL (ref 12.0–15.0)
LYMPHS PCT: 29.7 % (ref 12.0–46.0)
Lymphs Abs: 2 10*3/uL (ref 0.7–4.0)
MCHC: 34.4 g/dL (ref 30.0–36.0)
MCV: 91.5 fl (ref 78.0–100.0)
MONOS PCT: 7.7 % (ref 3.0–12.0)
Monocytes Absolute: 0.5 10*3/uL (ref 0.1–1.0)
Neutro Abs: 4.1 10*3/uL (ref 1.4–7.7)
Neutrophils Relative %: 60 % (ref 43.0–77.0)
Platelets: 239 10*3/uL (ref 150.0–400.0)
RBC: 4.6 Mil/uL (ref 3.87–5.11)
RDW: 13.4 % (ref 11.5–15.5)
WBC: 6.8 10*3/uL (ref 4.0–10.5)

## 2016-12-15 LAB — LIPID PANEL
CHOL/HDL RATIO: 4
Cholesterol: 157 mg/dL (ref 0–200)
HDL: 38 mg/dL — ABNORMAL LOW (ref >39.00)
LDL CALC: 86 mg/dL (ref 0–99)
NONHDL: 118.84
TRIGLYCERIDES: 162 mg/dL — AB (ref 0.0–149.0)
VLDL: 32.4 mg/dL (ref 0.0–40.0)

## 2016-12-15 LAB — LDL CHOLESTEROL, DIRECT: Direct LDL: 92 mg/dL

## 2016-12-15 LAB — TSH: TSH: 2 u[IU]/mL (ref 0.35–4.50)

## 2016-12-15 LAB — HEMOGLOBIN A1C: HEMOGLOBIN A1C: 6 % (ref 4.6–6.5)

## 2016-12-15 NOTE — Assessment & Plan Note (Signed)

## 2016-12-28 LAB — HM MAMMOGRAPHY

## 2017-01-04 ENCOUNTER — Encounter: Payer: Self-pay | Admitting: Internal Medicine

## 2017-01-25 ENCOUNTER — Encounter: Payer: Self-pay | Admitting: Internal Medicine

## 2017-01-25 ENCOUNTER — Ambulatory Visit (INDEPENDENT_AMBULATORY_CARE_PROVIDER_SITE_OTHER): Payer: Medicare Other | Admitting: Internal Medicine

## 2017-01-25 VITALS — BP 124/64 | HR 95 | Temp 97.8°F | Resp 16 | Ht 65.0 in | Wt 192.2 lb

## 2017-01-25 DIAGNOSIS — I10 Essential (primary) hypertension: Secondary | ICD-10-CM

## 2017-01-25 NOTE — Progress Notes (Signed)
Subjective:  Patient ID: Kayla Khan, female    DOB: 10-24-1950  Age: 66 y.o. MRN: [id number]  CC: Hypertension   HPI Kayla Khan presents for a BP check - she complains of fatigue and orthostatic dizziness and her BP was recently down to 118/64.  Outpatient Medications Prior to Visit  Medication Sig Dispense Refill  . Albuterol Sulfate (PROAIR RESPICLICK) 712 (90 BASE) MCG/ACT AEPB Inhale 1 puff into the lungs 4 (four) times daily as needed. 1 each 11  . ALPRAZolam (XANAX) 0.25 MG tablet Take 1 tablet (0.25 mg total) by mouth 2 (two) times daily as needed. 35 tablet 2  . Arginine 1000 MG TABS Take 2,000 mg by mouth 2 (two) times daily.    . Ascorbic Acid (VITAMIN C) 1000 MG tablet Take 1,000 mg by mouth daily.      Marland Kitchen aspirin 81 MG tablet Take 81 mg by mouth daily.      . cholecalciferol (VITAMIN D) 1000 units tablet Take 5,000 Units by mouth daily.    Marland Kitchen CITRULLINE PO Take 500 mg by mouth 2 (two) times daily.    . clobetasol (OLUX) 0.05 % topical foam Apply topically 2 (two) times daily. 50 g 5  . fish oil-omega-3 fatty acids 1000 MG capsule Take 3 g by mouth daily.      . pravastatin (PRAVACHOL) 40 MG tablet Take 1 tablet (40 mg total) by mouth daily. 90 tablet 3  . telmisartan (MICARDIS) 40 MG tablet Take 1 tablet (40 mg total) by mouth daily. 90 tablet 1  . chlorthalidone (HYGROTON) 25 MG tablet Take 1 tablet (25 mg total) by mouth daily. 90 tablet 1  . meloxicam (MOBIC) 15 MG tablet Take 15 mg by mouth daily as needed.  0   No facility-administered medications prior to visit.     ROS Review of Systems  Constitutional: Positive for fatigue. Negative for activity change, appetite change and diaphoresis.  HENT: Negative.  Negative for sinus pressure and trouble swallowing.   Eyes: Negative for visual disturbance.  Respiratory: Negative for cough, chest tightness, shortness of breath and wheezing.   Cardiovascular: Negative for chest pain, palpitations and leg swelling.    Gastrointestinal: Negative for abdominal pain, constipation, diarrhea, nausea and vomiting.  Genitourinary: Negative.  Negative for decreased urine volume, difficulty urinating, dysuria, frequency and urgency.  Musculoskeletal: Negative.  Negative for back pain and neck pain.  Skin: Negative.   Neurological: Positive for dizziness and light-headedness. Negative for syncope, weakness and headaches.  Hematological: Negative for adenopathy. Does not bruise/bleed easily.  Psychiatric/Behavioral: Negative.     Objective:  BP 124/64 (BP Location: Left Arm, Patient Position: Sitting, Cuff Size: Normal)   Pulse 95   Temp 97.8 F (36.6 C) (Oral)   Resp 16   Ht 5\' 5"  (1.651 m)   Wt 192 lb 4 oz (87.2 kg)   SpO2 98%   BMI 31.99 kg/m   BP Readings from Last 3 Encounters:  01/25/17 124/64  12/14/16 (!) 162/94  11/12/16 124/80    Wt Readings from Last 3 Encounters:  01/25/17 192 lb 4 oz (87.2 kg)  12/14/16 193 lb (87.5 kg)  11/12/16 192 lb (87.1 kg)    Physical Exam  Constitutional: She is oriented to person, place, and time. No distress.  HENT:  Mouth/Throat: Oropharynx is clear and moist. No oropharyngeal exudate.  Eyes: Conjunctivae are normal. Right eye exhibits no discharge. Left eye exhibits no discharge. No scleral icterus.  Neck: Normal range of  motion. Neck supple. No JVD present. No tracheal deviation present. No thyromegaly present.  Cardiovascular: Normal rate, regular rhythm, normal heart sounds and intact distal pulses.  Exam reveals no gallop and no friction rub.   No murmur heard. Pulmonary/Chest: Effort normal and breath sounds normal. No stridor. No respiratory distress. She has no wheezes. She has no rales. She exhibits no tenderness.  Abdominal: Soft. Bowel sounds are normal. She exhibits no distension and no mass. There is no tenderness. There is no rebound and no guarding.  Musculoskeletal: Normal range of motion. She exhibits no edema, tenderness or deformity.   Lymphadenopathy:    She has no cervical adenopathy.  Neurological: She is oriented to person, place, and time.  Skin: Skin is warm and dry. No rash noted. She is not diaphoretic. No erythema. No pallor.  Vitals reviewed.   Lab Results  Component Value Date   WBC 6.8 12/15/2016   HGB 14.5 12/15/2016   HCT 42.1 12/15/2016   PLT 239.0 12/15/2016   GLUCOSE 113 (H) 12/15/2016   CHOL 157 12/15/2016   TRIG 162.0 (H) 12/15/2016   HDL 38.00 (L) 12/15/2016   LDLDIRECT 92.0 12/15/2016   LDLCALC 86 12/15/2016   ALT 23 12/15/2016   AST 18 12/15/2016   NA 141 12/15/2016   K 4.3 12/15/2016   CL 105 12/15/2016   CREATININE 0.80 12/15/2016   BUN 24 (H) 12/15/2016   CO2 30 12/15/2016   TSH 2.00 12/15/2016   HGBA1C 6.0 12/15/2016    Mr Lumbar Spine Wo Contrast  Result Date: 05/08/2015 CLINICAL DATA:  Right-sided radiculitis.  Right-sided back pain EXAM: MRI LUMBAR SPINE WITHOUT CONTRAST TECHNIQUE: Multiplanar, multisequence MR imaging of the lumbar spine was performed. No intravenous contrast was administered. COMPARISON:  Lumbar MRI 02/12/2006 FINDINGS: Normal lumbar alignment. Negative for fracture or mass lesion. 15 mm parapelvic cyst left kidney. Conus medullaris normal and terminates at L1-2. L1-2:  Negative L2-3: Diffuse bulging of the disc. Bilateral facet hypertrophy causing mild spinal stenosis. Progression of degenerative change and stenosis since 2007 L3-4: Diffuse disc bulging and mild endplate osteophyte formation. Bilateral facet hypertrophy. Moderate spinal stenosis. Progression of degenerative changes stenosis since the prior MRI L4-5: Diffuse disc bulging. Superimposed central disc protrusion. Bilateral facet hypertrophy. Moderate to severe spinal stenosis has progressed in the interval. Subarticular stenosis bilaterally with expected impingement of the L5 nerve root bilaterally. L5-S1: Diffuse disc bulging more prominent on the left than the right. Mild spinal stenosis. Mild left S1  nerve root impingement. IMPRESSION: L2-3 mild spinal stenosis has progressed since 2007 L3-4 moderate spinal stenosis has progressed since prior study Moderate to severe spinal stenosis L4-5 has progressed. There is disc bulging and central disc protrusion. Expected impingement of the L5 nerve root bilaterally. Mild spinal stenosis L5-S1 with mild impingement of the left S1 nerve root. Electronically Signed   By: Franchot Gallo M.D.   On: 05/08/2015 07:08    Assessment & Plan:   Kayla Khan was seen today for hypertension.  Diagnoses and all orders for this visit:  Essential hypertension- her BP is over-controlled, I have asked her to stop the thiazide diuretic but to continue the ARB. She will monitor her BP and will let me know if further action is needed.   I have discontinued Kayla Khan's meloxicam and chlorthalidone. I am also having her maintain her aspirin, fish oil-omega-3 fatty acids, vitamin C, Albuterol Sulfate, ALPRAZolam, clobetasol, Arginine, CITRULLINE PO, pravastatin, cholecalciferol, and telmisartan.  No orders of the defined types were  placed in this encounter.    Follow-up: Return in about 4 months (around 05/28/2017).  Scarlette Calico, MD

## 2017-01-25 NOTE — Patient Instructions (Signed)

## 2017-06-11 ENCOUNTER — Other Ambulatory Visit: Payer: Self-pay | Admitting: Internal Medicine

## 2017-06-11 DIAGNOSIS — I10 Essential (primary) hypertension: Secondary | ICD-10-CM

## 2017-06-14 NOTE — Telephone Encounter (Signed)
Pt due for an appt in September per 01/2017 OV note

## 2017-06-16 ENCOUNTER — Telehealth: Payer: Self-pay | Admitting: *Deleted

## 2017-06-16 DIAGNOSIS — I10 Essential (primary) hypertension: Secondary | ICD-10-CM

## 2017-06-16 MED ORDER — TELMISARTAN 40 MG PO TABS: 40.0000 mg | ORAL_TABLET | Freq: Every day | ORAL | 1 refills | Status: DC

## 2017-06-16 NOTE — Telephone Encounter (Signed)
RX sent

## 2017-07-14 ENCOUNTER — Other Ambulatory Visit: Payer: Self-pay | Admitting: Physician Assistant

## 2017-09-24 ENCOUNTER — Ambulatory Visit: Payer: Self-pay

## 2017-09-24 ENCOUNTER — Other Ambulatory Visit: Payer: Self-pay | Admitting: Occupational Medicine

## 2017-09-24 DIAGNOSIS — M25562 Pain in left knee: Secondary | ICD-10-CM

## 2017-11-10 ENCOUNTER — Encounter: Payer: Self-pay | Admitting: Pulmonary Disease

## 2017-11-10 ENCOUNTER — Ambulatory Visit: Payer: Medicare Other | Admitting: Pulmonary Disease

## 2017-11-10 VITALS — BP 144/78 | HR 69 | Ht 60.0 in | Wt 170.0 lb

## 2017-11-10 DIAGNOSIS — G4733 Obstructive sleep apnea (adult) (pediatric): Secondary | ICD-10-CM | POA: Diagnosis not present

## 2017-11-10 NOTE — Progress Notes (Signed)
   Subjective:    Patient ID: [name], female    DOB: 1950/10/10, 67 y.o.   MRN: [id number]  HPI  67 yo female followed for mild OSA on CPAP & RLS she has a history of spinal stenosis with neuropathy She has been diagnosed in 2004 and maintained on CPAP since then. Download, last reviewed in 2018 had shown excellent compliance.  3 brevis only improved her daytime somnolence and fatigue. She has lost about 26 pounds to a current weight of 178 pounds over the last 4-5 years. She would like to discuss alternatives to CPAP therapy today, she has good dentition, uses a mouthguard for grinding her teeth She also wonders if she can get a new CPAP machine   She reports frequent nocturnal awakenings due to back pain and sometimes has tingling in her feet.  Restless leg symptoms are well controlled and has not recurred  Significant tests/ events reviewed  NPSG 2004: AHI 10/hr, cpap optimized to 5cm.  Review of Systems Patient denies significant dyspnea,cough, hemoptysis,  chest pain, palpitations, pedal edema, orthopnea, paroxysmal nocturnal dyspnea, lightheadedness, nausea, vomiting, abdominal or  leg pains      Objective:   Physical Exam  Gen. Pleasant, well-nourished, in no distress ENT - no thrush, no post nasal drip Neck: No JVD, no thyromegaly, no carotid bruits Lungs: no use of accessory muscles, no dullness to percussion, clear without rales or rhonchi  Cardiovascular: Rhythm regular, heart sounds  normal, no murmurs or gallops, no peripheral edema Musculoskeletal: No deformities, no cyanosis or clubbing        Assessment & Plan:

## 2017-11-10 NOTE — Assessment & Plan Note (Signed)
Well-controlled, does not appear to be to restless legs, may be related to spinal stenosis

## 2017-11-10 NOTE — Patient Instructions (Signed)
Home sleep study will be scheduled due to her weight loss. Referral to Dr. Oneal Grout for oral appliance.

## 2017-11-10 NOTE — Assessment & Plan Note (Signed)
Home sleep study will be scheduled due to her weight loss. We discussed alternatives to CPAP Referral to Dr. Oneal Grout for oral appliance.  If above does not work, we will get her a new CPAP machine   compliance with goal of at least 4-6 hrs every night is the expectation. Advised against medications with sedative side effects Cautioned against driving when sleepy - understanding that sleepiness will vary on a day to day basis

## 2017-11-15 ENCOUNTER — Encounter: Payer: Self-pay | Admitting: Cardiology

## 2017-11-15 ENCOUNTER — Ambulatory Visit: Payer: Medicare Other | Admitting: Cardiology

## 2017-11-15 VITALS — BP 120/80 | HR 80 | Ht 60.0 in | Wt 171.0 lb

## 2017-11-15 DIAGNOSIS — I2510 Atherosclerotic heart disease of native coronary artery without angina pectoris: Secondary | ICD-10-CM

## 2017-11-15 DIAGNOSIS — I10 Essential (primary) hypertension: Secondary | ICD-10-CM

## 2017-11-15 DIAGNOSIS — E785 Hyperlipidemia, unspecified: Secondary | ICD-10-CM | POA: Diagnosis not present

## 2017-11-15 MED ORDER — PRAVASTATIN SODIUM 40 MG PO TABS: 40.0000 mg | ORAL_TABLET | Freq: Every day | ORAL | 3 refills | Status: DC

## 2017-11-15 MED ORDER — TELMISARTAN 40 MG PO TABS: 40.0000 mg | ORAL_TABLET | Freq: Every day | ORAL | 1 refills | Status: DC

## 2017-11-15 NOTE — Patient Instructions (Signed)

## 2017-11-15 NOTE — Progress Notes (Signed)
Cardiology Office Note    Date:  11/15/2017   ID:  Kayla Khan, DOB 12-29-1950, MRN [id number]  PCP:  Janith Lima, MD  Cardiologist:  Ena Dawley, MD   Reason for visit: 1 year follow up  History of Present Illness:  Kayla Khan is a 67 y.o. female who was previously followed by Dr Ron Parker for chest discomfort. Coronary arteries were normal by catheterization in 2004. 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 Nuclear scan in December, 2008 was normal. In the past few years she started taking arginine. She feels this has helped her substantially. She is not having any significant pain.  See states that she has been stable, she has been experimenting with different OTC medications and states that L-citruline and L arginine made a significant difference for her. She only feels occasional exertional SOB or CP, no CP at rest, no palpitations, falls, syncope. No LE edema. She is tolerating pravastatin well.   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 She has changed her diet, and has lost 25 pounds since last year. She feels great.   Past Medical History:  Diagnosis Date  . Allergic conjunctivitis   . Anemia, iron deficiency   . Angina pectoris   . Arthritis   . Asthma   . 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 Depression with anxiety   . Diverticulosis of colon   . Diverticulosis of colon   . DJD (degenerative joint disease)   . Ejection fraction    70%, catheter 2004 / normal LV function nuclear, 2008 ( no echo data as of December 23, 2010)  . Fatigue   . GERD (gastroesophageal reflux disease)   . HLA B27 positive   . HLD (hyperlipidemia)   . Palpitations   . Sleep apnea     . Thyroid nodule    The patient had a thyroid nodule that was surgically removed in the past.  She has one half of her thyroid and does not take replacement of Taxol and he was benign    Past Surgical History:  Procedure Laterality Date  . APPENDECTOMY    . BREAST BIOPSY    . CESAREAN SECTION     x2  . GANGLION CYST EXCISION    . THYROIDECTOMY, PARTIAL     left  . TUBAL LIGATION    . VESICOVAGINAL FISTULA CLOSURE W/ TAH      Current Medications: Outpatient Medications Prior to Visit  Medication Sig Dispense Refill  . Albuterol Sulfate (PROAIR RESPICLICK) 440 (90 BASE) MCG/ACT AEPB Inhale 1 puff into the lungs 4 (four) times daily as needed. 1 each 11  . ALPRAZolam (XANAX) 0.25 MG tablet Take 1 tablet (0.25 mg total) by mouth 2 (two) times daily as needed. 35 tablet 2  . Arginine 1000 MG TABS Take 2,000 mg by mouth 2 (two) times daily.    . Ascorbic Acid (VITAMIN C) 1000 MG tablet Take 1,000 mg by mouth daily.      Marland Kitchen aspirin 81 MG tablet Take 81 mg by mouth daily.      . cholecalciferol (VITAMIN D) 1000 units tablet Take 5,000 Units by mouth daily.    Marland Kitchen CITRULLINE  PO Take 500 mg by mouth 2 (two) times daily.    . clobetasol (OLUX) 0.05 % topical foam Apply topically 2 (two) times daily. 50 g 5  . fish oil-omega-3 fatty acids 1000 MG capsule Take 3 g by mouth daily.      . pravastatin (PRAVACHOL) 40 MG tablet Take 1 tablet (40 mg total) by mouth daily. 90 tablet 3  . telmisartan (MICARDIS) 40 MG tablet Take 1 tablet (40 mg total) by mouth daily. 90 tablet 1   No facility-administered medications prior to visit.      Allergies:   Amoxicillin-pot clavulanate; Latex; and Meperidine hcl   Social History   Socioeconomic History  . Marital status: Divorced    Spouse name: None  . Number of children: None  . Years of education: None  . Highest education level: None  Social Needs  . Financial resource strain: None  . Food insecurity - worry: None  . Food insecurity -  inability: None  . Transportation needs - medical: None  . Transportation needs - non-medical: None  Occupational History  . Occupation: Pharmacist, hospital  Tobacco Use  . Smoking status: Never Smoker  . Smokeless tobacco: Never Used  Substance and Sexual Activity  . Alcohol use: No  . Drug use: No  . Sexual activity: Not Currently  Other Topics Concern  . None  Social History Narrative  . None    Family History:  The patient's family history includes Aneurysm in her unknown relative; Aneurysm (age of onset: 37) in her father; Arthritis in her unknown relative; Cancer in her brother, mother, and unknown relative; Diabetes in her unknown relative; Heart attack in her maternal grandfather; Heart disease in her unknown relative; Hypertension in her father and unknown relative; Stroke in her maternal grandmother and paternal grandfather.   ROS:   Please see the history of present illness.    ROS All other systems reviewed and are negative.  PHYSICAL EXAM:   VS:  BP 120/80 (BP Location: Left Arm, Patient Position: Sitting, Cuff Size: Normal)   Pulse 80   Ht 5' (1.524 m)   Wt 171 lb (77.6 kg)   SpO2 95%   BMI 33.40 kg/m    GEN: Well nourished, well developed, in no acute distress  HEENT: normal  Neck: no JVD, carotid bruits, or masses Cardiac: RRR; no murmurs, rubs, or gallops,no edema  Respiratory:  clear to auscultation bilaterally, normal work of breathing GI: soft, nontender, nondistended, + BS MS: no deformity or atrophy  Skin: warm and dry, no rash Neuro:  Alert and Oriented x 3, Strength and sensation are intact Psych: euthymic mood, full affect  Wt Readings from Last 3 Encounters:  11/15/17 171 lb (77.6 kg)  11/10/17 170 lb (77.1 kg)  01/25/17 192 lb 4 oz (87.2 kg)    Studies/Labs Reviewed:   EKG:  EKG is ordered today.  The ekg ordered today demonstrates SR, normal ECG, unchanged from prior.  Recent Labs: 12/15/2016: ALT 23; BUN 24; Creatinine, Ser 0.80; Hemoglobin 14.5;  Platelets 239.0; Potassium 4.3; Sodium 141; TSH 2.00   Lipid Panel    Component Value Date/Time   CHOL 157 12/15/2016 0934   TRIG 162.0 (H) 12/15/2016 0934   HDL 38.00 (L) 12/15/2016 0934   CHOLHDL 4 12/15/2016 0934   VLDL 32.4 12/15/2016 0934   LDLCALC 86 12/15/2016 0934   LDLDIRECT 92.0 12/15/2016 0934    Additional studies/ records that were reviewed today include:   ECG: SR, normal ECG, no  change, personally reviewed.   ASSESSMENT:    1. Essential hypertension   2. Coronary artery disease involving native coronary artery of native heart without angina pectoris   3. Hyperlipidemia with target LDL less than 130      PLAN:  In order of problems listed above:  1. The patient is asymptomatic.  2. BP controlled. 3. TG elevated, however on pravastatin and fish oil already, with the new diet is most probably controlled, she is seeing Dr Ronnald Ramp soon and he will recheck it.  Medication Adjustments/Labs and Tests Ordered: Current medicines are reviewed at length with the patient today.  Concerns regarding medicines are outlined above.  Medication changes, Labs and Tests ordered today are listed in the Patient Instructions below. Patient Instructions  Medication Instructions:   Your physician recommends that you continue on your current medications as directed. Please refer to the Current Medication list given to you today.     Follow-Up:  Your physician wants you to follow-up in: [place] will receive a reminder letter in the mail two months in advance. If you don't receive a letter, please call our office to schedule the follow-up appointment.        If you need a refill on your cardiac medications before your next appointment, please call your pharmacy.      Signed, Ena Dawley, MD  11/15/2017 4:07 PM    [facility] [place], [place], [place]  [address] Phone: [phone]; Fax: [phone]

## 2017-12-08 DIAGNOSIS — G4733 Obstructive sleep apnea (adult) (pediatric): Secondary | ICD-10-CM | POA: Diagnosis not present

## 2017-12-10 ENCOUNTER — Other Ambulatory Visit: Payer: Self-pay | Admitting: *Deleted

## 2017-12-10 ENCOUNTER — Other Ambulatory Visit: Payer: Self-pay | Admitting: Internal Medicine

## 2017-12-10 DIAGNOSIS — G4733 Obstructive sleep apnea (adult) (pediatric): Secondary | ICD-10-CM

## 2017-12-10 DIAGNOSIS — I10 Essential (primary) hypertension: Secondary | ICD-10-CM

## 2017-12-14 DIAGNOSIS — G4733 Obstructive sleep apnea (adult) (pediatric): Secondary | ICD-10-CM | POA: Diagnosis not present

## 2017-12-15 ENCOUNTER — Telehealth: Payer: Self-pay | Admitting: Pulmonary Disease

## 2017-12-15 DIAGNOSIS — G4733 Obstructive sleep apnea (adult) (pediatric): Secondary | ICD-10-CM

## 2017-12-15 NOTE — Telephone Encounter (Signed)
Per RA, HST showed that mild OSA persists. 14 events per hour, increased when patient sleeps on her back.   He recommends a referral to Dr. Ron Parker for oral appliance for OSA.

## 2017-12-20 NOTE — Telephone Encounter (Signed)
Spoke with patient. She is aware of results. She wishes to proceed with the referral to Dr. Ron Parker.   Referral has been placed. Nothing else needed at time of call.

## 2017-12-22 ENCOUNTER — Ambulatory Visit: Payer: Medicare Other | Admitting: Family

## 2017-12-22 ENCOUNTER — Encounter: Payer: Self-pay | Admitting: Family

## 2017-12-22 VITALS — BP 120/72 | HR 84 | Temp 98.3°F | Ht 60.0 in | Wt 169.1 lb

## 2017-12-22 DIAGNOSIS — J4520 Mild intermittent asthma, uncomplicated: Secondary | ICD-10-CM | POA: Diagnosis not present

## 2017-12-22 DIAGNOSIS — J309 Allergic rhinitis, unspecified: Secondary | ICD-10-CM | POA: Diagnosis not present

## 2017-12-22 MED ORDER — ALBUTEROL SULFATE 108 (90 BASE) MCG/ACT IN AEPB: 1.0000 | INHALATION_SPRAY | Freq: Four times a day (QID) | RESPIRATORY_TRACT | 11 refills | Status: DC | PRN

## 2017-12-22 MED ORDER — FLUTICASONE PROPIONATE 50 MCG/ACT NA SUSP: 2.0000 | Freq: Every day | NASAL | 6 refills | Status: DC

## 2017-12-22 NOTE — Patient Instructions (Signed)
Ask your pharmacist about covering for Xyzal if you don't like Zyrtec;

## 2017-12-22 NOTE — Progress Notes (Signed)
Kayla Khan is a 67 y.o. female with the following history as recorded in EpicCare:  Patient Active Problem List   Diagnosis Date Noted  . Visit for screening mammogram 12/14/2016  . Spinal stenosis of lumbar region with radiculopathy 04/18/2015  . Osteopenia, senile 02/20/2015  . Obesity (BMI 30.0-34.9) 03/22/2013  . RLS (restless legs syndrome) 04/18/2012  . Routine general medical examination at a health care facility 10/19/2011  . Thyroid nodule   . Hyperglycemia 07/05/2009  . Hyperlipidemia with target LDL less than 130 12/11/2008  . Iron deficiency anemia 05/03/2008  . DEPRESSION/ANXIETY 05/03/2008  . Essential hypertension 05/03/2008  . Asthma 05/03/2008  . GERD 05/03/2008  . DEGENERATIVE JOINT DISEASE 05/03/2008  . OSA (obstructive sleep apnea) 05/03/2008    Current Outpatient Medications  Medication Sig Dispense Refill  . Albuterol Sulfate (PROAIR RESPICLICK) 720 (90 Base) MCG/ACT AEPB Inhale 1 puff into the lungs 4 (four) times daily as needed. 1 each 11  . ALPRAZolam (XANAX) 0.25 MG tablet Take 1 tablet (0.25 mg total) by mouth 2 (two) times daily as needed. 35 tablet 2  . Arginine 1000 MG TABS Take 2,000 mg by mouth 2 (two) times daily.    . Ascorbic Acid (VITAMIN C) 1000 MG tablet Take 1,000 mg by mouth daily.      Marland Kitchen aspirin 81 MG tablet Take 81 mg by mouth daily.      . cholecalciferol (VITAMIN D) 1000 units tablet Take 5,000 Units by mouth daily.    Marland Kitchen CITRULLINE PO Take 500 mg by mouth 2 (two) times daily.    . clobetasol (OLUX) 0.05 % topical foam Apply topically 2 (two) times daily. 50 g 5  . fish oil-omega-3 fatty acids 1000 MG capsule Take 3 g by mouth daily.      . fluticasone (FLONASE) 50 MCG/ACT nasal spray Place 2 sprays into both nostrils daily. 16 g 6  . pravastatin (PRAVACHOL) 40 MG tablet Take 1 tablet (40 mg total) by mouth daily. 90 tablet 3  . telmisartan (MICARDIS) 40 MG tablet Take 1 tablet (40 mg total) by mouth daily. 90 tablet 1  .  telmisartan (MICARDIS) 40 MG tablet TAKE 1 TABLET(40 MG) BY MOUTH DAILY 90 tablet 0   No current facility-administered medications for this visit.     Allergies: Amoxicillin-pot clavulanate; Latex; and Meperidine hcl  Past Medical History:  Diagnosis Date  . Allergic conjunctivitis   . Anemia, iron deficiency   . Angina pectoris   . Arthritis   . Asthma   . 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 Depression with anxiety   . Diverticulosis of colon   . Diverticulosis of colon   . DJD (degenerative joint disease)   . Ejection fraction    70%, catheter 2004 / normal LV function nuclear, 2008 ( no echo data as of December 23, 2010)  . Fatigue   . GERD (gastroesophageal reflux disease)   . HLA B27 positive   . HLD (hyperlipidemia)   . Palpitations   . Sleep apnea   . Thyroid nodule    The patient had a thyroid nodule that was surgically removed in the past.  She has one half of her thyroid and does not take replacement of Taxol and he was benign    Past Surgical History:  Procedure Laterality Date  . APPENDECTOMY    . BREAST BIOPSY    . CESAREAN SECTION  x2  . GANGLION CYST EXCISION    . THYROIDECTOMY, PARTIAL     left  . TUBAL LIGATION    . VESICOVAGINAL FISTULA CLOSURE W/ TAH      Family History  Problem Relation Age of Onset  . Cancer Mother   . Hypertension Father   . Aneurysm Father 65       cerebral hemorrhage  . Diabetes Unknown   . Heart disease Unknown   . Cancer Unknown   . Arthritis Unknown   . Hypertension Unknown   . Aneurysm Unknown   . Heart attack Maternal Grandfather   . Stroke Paternal Grandfather   . Stroke Maternal Grandmother   . Cancer Brother     Social History   Tobacco Use  . Smoking status: Never Smoker  . Smokeless tobacco: Never Used  Substance Use Topics  . Alcohol use: No    Subjective:  Patient presents with worsening allergy symptoms;  notes that she has been having increased problems with sneezing recently- does not feel that Allegra is helping anymore- wonders about alternatives; would like referral back to allergist- has taken allergy shots in the past; also requesting refill on her albuterol; has allergic asthma and is worried about having an asthma attack;   Objective:  Vitals:   12/22/17 1054  BP: 120/72  Pulse: 84  Temp: 98.3 F (36.8 C)  TempSrc: Oral  SpO2: 96%  Weight: 169 lb 1.3 oz (76.7 kg)  Height: 5' (1.524 m)    General: Well developed, well nourished, in no acute distress  Skin : Warm and dry.  Head: Normocephalic and atraumatic  Eyes: Sclera and conjunctiva clear; pupils round and reactive to light; extraocular movements intact  Ears: External normal; canals clear; tympanic membranes normal  Oropharynx: Pink, supple. No suspicious lesions  Neck: Supple without thyromegaly, adenopathy  Lungs: Respirations unlabored; clear to auscultation bilaterally without wheeze, rales, rhonchi  CVS exam: normal rate and regular rhythm.  Neurologic: Alert and oriented; speech intact; face symmetrical; moves all extremities well; CNII-XII intact without focal deficit   Assessment:  1. Allergic rhinitis, unspecified seasonality, unspecified trigger   2. Mild intermittent asthma without complication     Plan:  Referral to allergist as requested; trial of Flonase and Zyrtec as opposed to Allegra ( also discussed option of Xyzal); refill given on albuterol as requested;  Keep planned follow-up with her PCP for upcoming CPE;   No follow-ups on file.  Orders Placed This Encounter  Procedures  . Ambulatory referral to Allergy    Referral Priority:   Routine    Referral Type:   Allergy Testing    Referral Reason:   Specialty Services Required    Requested Specialty:   Allergy    Number of Visits Requested:   1    Requested Prescriptions   Signed Prescriptions Disp Refills  . fluticasone (FLONASE) 50 MCG/ACT  nasal spray 16 g 6    Sig: Place 2 sprays into both nostrils daily.  . Albuterol Sulfate (PROAIR RESPICLICK) 644 (90 Base) MCG/ACT AEPB 1 each 11    Sig: Inhale 1 puff into the lungs 4 (four) times daily as needed.

## 2017-12-29 LAB — HM MAMMOGRAPHY

## 2018-01-03 ENCOUNTER — Encounter: Payer: Self-pay | Admitting: Internal Medicine

## 2018-01-05 ENCOUNTER — Ambulatory Visit (INDEPENDENT_AMBULATORY_CARE_PROVIDER_SITE_OTHER): Payer: Medicare Other | Admitting: Internal Medicine

## 2018-01-05 ENCOUNTER — Other Ambulatory Visit (INDEPENDENT_AMBULATORY_CARE_PROVIDER_SITE_OTHER): Payer: Medicare Other

## 2018-01-05 ENCOUNTER — Encounter: Payer: Self-pay | Admitting: Internal Medicine

## 2018-01-05 VITALS — BP 132/70 | HR 77 | Temp 98.3°F | Ht 60.0 in | Wt 171.2 lb

## 2018-01-05 DIAGNOSIS — E785 Hyperlipidemia, unspecified: Secondary | ICD-10-CM | POA: Diagnosis not present

## 2018-01-05 DIAGNOSIS — E041 Nontoxic single thyroid nodule: Secondary | ICD-10-CM | POA: Diagnosis not present

## 2018-01-05 DIAGNOSIS — Z0000 Encounter for general adult medical examination without abnormal findings: Secondary | ICD-10-CM

## 2018-01-05 DIAGNOSIS — K2100 Gastro-esophageal reflux disease with esophagitis, without bleeding: Secondary | ICD-10-CM

## 2018-01-05 DIAGNOSIS — R7303 Prediabetes: Secondary | ICD-10-CM

## 2018-01-05 DIAGNOSIS — G2581 Restless legs syndrome: Secondary | ICD-10-CM | POA: Diagnosis not present

## 2018-01-05 DIAGNOSIS — I10 Essential (primary) hypertension: Secondary | ICD-10-CM

## 2018-01-05 DIAGNOSIS — D508 Other iron deficiency anemias: Secondary | ICD-10-CM | POA: Diagnosis not present

## 2018-01-05 DIAGNOSIS — F341 Dysthymic disorder: Secondary | ICD-10-CM

## 2018-01-05 LAB — IBC PANEL
Iron: 71 ug/dL (ref 42–145)
SATURATION RATIOS: 22.4 % (ref 20.0–50.0)
Transferrin: 226 mg/dL (ref 212.0–360.0)

## 2018-01-05 LAB — COMPREHENSIVE METABOLIC PANEL
ALBUMIN: 4.3 g/dL (ref 3.5–5.2)
ALK PHOS: 75 U/L (ref 39–117)
ALT: 15 U/L (ref 0–35)
AST: 13 U/L (ref 0–37)
BUN: 23 mg/dL (ref 6–23)
CO2: 32 mEq/L (ref 19–32)
Calcium: 10 mg/dL (ref 8.4–10.5)
Chloride: 105 mEq/L (ref 96–112)
Creatinine, Ser: 0.79 mg/dL (ref 0.40–1.20)
GFR: 76.64 mL/min (ref >60.00)
GLUCOSE: 112 mg/dL — AB (ref 70–99)
Potassium: 4.4 mEq/L (ref 3.5–5.1)
Sodium: 143 mEq/L (ref 135–145)
TOTAL PROTEIN: 7.4 g/dL (ref 6.0–8.3)
Total Bilirubin: 0.3 mg/dL (ref 0.2–1.2)

## 2018-01-05 LAB — CBC WITH DIFFERENTIAL/PLATELET
BASOS ABS: 0.1 10*3/uL (ref 0.0–0.1)
Basophils Relative: 0.8 % (ref 0.0–3.0)
Eosinophils Absolute: 0.1 10*3/uL (ref 0.0–0.7)
Eosinophils Relative: 2.2 % (ref 0.0–5.0)
HCT: 40 % (ref 36.0–46.0)
Hemoglobin: 13.7 g/dL (ref 12.0–15.0)
LYMPHS ABS: 2.4 10*3/uL (ref 0.7–4.0)
Lymphocytes Relative: 39.7 % (ref 12.0–46.0)
MCHC: 34.3 g/dL (ref 30.0–36.0)
MCV: 92.1 fl (ref 78.0–100.0)
Monocytes Absolute: 0.5 10*3/uL (ref 0.1–1.0)
Monocytes Relative: 7.4 % (ref 3.0–12.0)
NEUTROS PCT: 49.9 % (ref 43.0–77.0)
Neutro Abs: 3 10*3/uL (ref 1.4–7.7)
Platelets: 262 10*3/uL (ref 150.0–400.0)
RBC: 4.34 Mil/uL (ref 3.87–5.11)
RDW: 12.9 % (ref 11.5–15.5)
WBC: 6.1 10*3/uL (ref 4.0–10.5)

## 2018-01-05 LAB — LIPID PANEL
CHOLESTEROL: 151 mg/dL (ref 0–200)
HDL: 37.4 mg/dL — AB (ref >39.00)
LDL Cholesterol: 80 mg/dL (ref 0–99)
NONHDL: 113.51
Total CHOL/HDL Ratio: 4
Triglycerides: 167 mg/dL — ABNORMAL HIGH (ref 0.0–149.0)
VLDL: 33.4 mg/dL (ref 0.0–40.0)

## 2018-01-05 LAB — FERRITIN: FERRITIN: 202.3 ng/mL (ref 10.0–291.0)

## 2018-01-05 LAB — HEMOGLOBIN A1C: Hgb A1c MFr Bld: 5.8 % (ref 4.6–6.5)

## 2018-01-05 MED ORDER — ALBUTEROL SULFATE HFA 108 (90 BASE) MCG/ACT IN AERS: 2.0000 | INHALATION_SPRAY | Freq: Four times a day (QID) | RESPIRATORY_TRACT | 3 refills | Status: DC | PRN

## 2018-01-05 MED ORDER — ALPRAZOLAM 0.25 MG PO TABS: 0.2500 mg | ORAL_TABLET | Freq: Two times a day (BID) | ORAL | 2 refills | Status: DC | PRN

## 2018-01-05 NOTE — Patient Instructions (Signed)

## 2018-01-05 NOTE — Progress Notes (Signed)
Subjective:  Patient ID: Kayla Khan, female    DOB: Jan 26, 1951  Age: 67 y.o. MRN: [id number]  CC: Hypertension; Hyperlipidemia; and Annual Exam   HPI Kayla Khan presents for a CPX.  She tells me that her asthma and allergy symptoms are well controlled with the current combination of antihistamines and inhalers.  She continues to struggle with the occasional episode of anxiety and panic and wants a refill of Xanax.  She does not feel like she needs to take an SSRI.  She tells me her blood pressure has been well controlled and she denies any recent episodes of DOE, CP, SOB, palpitations, edema, or fatigue.  She has been able to lose weight recently with diet and lifestyle modifications.  Past Medical History:  Diagnosis Date  . Allergic conjunctivitis   . Anemia, iron deficiency   . Angina pectoris   . Arthritis   . Asthma   . 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 Depression with anxiety   . Diverticulosis of colon   . Diverticulosis of colon   . DJD (degenerative joint disease)   . Ejection fraction    70%, catheter 2004 / normal LV function nuclear, 2008 ( no echo data as of December 23, 2010)  . Fatigue   . GERD (gastroesophageal reflux disease)   . HLA B27 positive   . HLD (hyperlipidemia)   . Palpitations   . Sleep apnea   . Thyroid nodule    The patient had a thyroid nodule that was surgically removed in the past.  She has one half of her thyroid and does not take replacement of Taxol and he was benign   Past Surgical History:  Procedure Laterality Date  . APPENDECTOMY    . BREAST BIOPSY    . CESAREAN SECTION     x2  . GANGLION CYST EXCISION    . THYROIDECTOMY, PARTIAL     left  . TUBAL LIGATION    . VESICOVAGINAL FISTULA CLOSURE W/ TAH      reports that she has never smoked. She has never used smokeless tobacco. She reports that she does not drink alcohol or use  drugs. family history includes Aneurysm in her unknown relative; Aneurysm (age of onset: 58) in her father; Arthritis in her unknown relative; Cancer in her brother, mother, and unknown relative; Diabetes in her unknown relative; Heart attack in her maternal grandfather; Heart disease in her unknown relative; Hypertension in her father and unknown relative; Stroke in her maternal grandmother and paternal grandfather. Allergies  Allergen Reactions  . Amoxicillin-Pot Clavulanate   . Latex   . Meperidine Hcl     Outpatient Medications Prior to Visit  Medication Sig Dispense Refill  . cholecalciferol (VITAMIN D) 1000 units tablet Take 5,000 Units by mouth daily.    . clobetasol (OLUX) 0.05 % topical foam Apply topically 2 (two) times daily. 50 g 5  . fish oil-omega-3 fatty acids 1000 MG capsule Take 3 g by mouth daily.      . fluticasone (FLONASE) 50 MCG/ACT nasal spray Place 2 sprays into both nostrils daily. 16 g 6  . pravastatin (PRAVACHOL) 40 MG tablet Take 1 tablet (40 mg total) by mouth daily. 90 tablet 3  . telmisartan (MICARDIS) 40 MG tablet Take 1 tablet (40 mg total) by mouth daily. 90 tablet 1  . Albuterol Sulfate (PROAIR RESPICLICK) 272 (90 Base) MCG/ACT AEPB Inhale 1  puff into the lungs 4 (four) times daily as needed. 1 each 11  . ALPRAZolam (XANAX) 0.25 MG tablet Take 1 tablet (0.25 mg total) by mouth 2 (two) times daily as needed. 35 tablet 2  . Arginine 1000 MG TABS Take 2,000 mg by mouth 2 (two) times daily.    . Ascorbic Acid (VITAMIN C) 1000 MG tablet Take 1,000 mg by mouth daily.      Marland Kitchen aspirin 81 MG tablet Take 81 mg by mouth daily.      Marland Kitchen CITRULLINE PO Take 500 mg by mouth 2 (two) times daily.    Marland Kitchen telmisartan (MICARDIS) 40 MG tablet TAKE 1 TABLET(40 MG) BY MOUTH DAILY 90 tablet 0   No facility-administered medications prior to visit.     ROS Review of Systems  Constitutional: Negative.  Negative for chills, fatigue and fever.  HENT: Positive for congestion, postnasal  drip and rhinorrhea. Negative for nosebleeds, sinus pressure, sore throat and trouble swallowing.   Eyes: Negative.   Respiratory: Negative.  Negative for cough, chest tightness, shortness of breath and wheezing.   Cardiovascular: Negative for chest pain, palpitations and leg swelling.  Gastrointestinal: Negative for abdominal pain, diarrhea, nausea and vomiting.  Endocrine: Negative.   Genitourinary: Negative.  Negative for difficulty urinating.  Musculoskeletal: Negative.  Negative for arthralgias, myalgias and neck pain.  Skin: Negative.  Negative for color change, pallor and rash.  Allergic/Immunologic: Negative.   Neurological: Negative.  Negative for dizziness, weakness, light-headedness and numbness.  Hematological: Negative for adenopathy. Does not bruise/bleed easily.  Psychiatric/Behavioral: Negative for agitation, confusion, decreased concentration, dysphoric mood, sleep disturbance and suicidal ideas. The patient is nervous/anxious.     Objective:  BP 132/70 (BP Location: Left Arm, Patient Position: Sitting, Cuff Size: Normal)   Pulse 77   Temp 98.3 F (36.8 C) (Oral)   Ht 5' (1.524 m)   Wt 171 lb 4 oz (77.7 kg)   SpO2 97%   BMI 33.44 kg/m   BP Readings from Last 3 Encounters:  01/05/18 132/70  12/22/17 120/72  11/15/17 120/80    Wt Readings from Last 3 Encounters:  01/05/18 171 lb 4 oz (77.7 kg)  12/22/17 169 lb 1.3 oz (76.7 kg)  11/15/17 171 lb (77.6 kg)    Physical Exam  Constitutional: She is oriented to person, place, and time. No distress.  HENT:  Mouth/Throat: Oropharynx is clear and moist. No oropharyngeal exudate.  Eyes: Conjunctivae are normal. No scleral icterus.  Neck: Normal range of motion. Neck supple. No JVD present. No thyroid mass and no thyromegaly present.  Cardiovascular: Normal rate, regular rhythm and normal heart sounds. Exam reveals no gallop and no friction rub.  No murmur heard. Pulmonary/Chest: Effort normal and breath sounds  normal. No stridor. No respiratory distress. She has no wheezes. She has no rales.  Abdominal: Soft. Bowel sounds are normal. She exhibits no mass. There is no tenderness.  Genitourinary:  Genitourinary Comments: Breast, GU, rectal exams were deferred at her request since she tells me that this was recently done by gynecologist.  Musculoskeletal: Normal range of motion. She exhibits no edema, tenderness or deformity.  Lymphadenopathy:    She has no cervical adenopathy.  Neurological: She is alert and oriented to person, place, and time.  Skin: Skin is warm and dry. She is not diaphoretic. No pallor.  Psychiatric: She has a normal mood and affect. Her behavior is normal. Judgment and thought content normal.  Vitals reviewed.   Lab Results  Component Value  Date   WBC 6.1 01/05/2018   HGB 13.7 01/05/2018   HCT 40.0 01/05/2018   PLT 262.0 01/05/2018   GLUCOSE 112 (H) 01/05/2018   CHOL 151 01/05/2018   TRIG 167.0 (H) 01/05/2018   HDL 37.40 (L) 01/05/2018   LDLDIRECT 92.0 12/15/2016   LDLCALC 80 01/05/2018   ALT 15 01/05/2018   AST 13 01/05/2018   NA 143 01/05/2018   K 4.4 01/05/2018   CL 105 01/05/2018   CREATININE 0.79 01/05/2018   BUN 23 01/05/2018   CO2 32 01/05/2018   TSH 1.56 01/05/2018   HGBA1C 5.8 01/05/2018    Mr Lumbar Spine Wo Contrast  Result Date: 05/08/2015 CLINICAL DATA:  Right-sided radiculitis.  Right-sided back pain EXAM: MRI LUMBAR SPINE WITHOUT CONTRAST TECHNIQUE: Multiplanar, multisequence MR imaging of the lumbar spine was performed. No intravenous contrast was administered. COMPARISON:  Lumbar MRI 02/12/2006 FINDINGS: Normal lumbar alignment. Negative for fracture or mass lesion. 15 mm parapelvic cyst left kidney. Conus medullaris normal and terminates at L1-2. L1-2:  Negative L2-3: Diffuse bulging of the disc. Bilateral facet hypertrophy causing mild spinal stenosis. Progression of degenerative change and stenosis since 2007 L3-4: Diffuse disc bulging and mild  endplate osteophyte formation. Bilateral facet hypertrophy. Moderate spinal stenosis. Progression of degenerative changes stenosis since the prior MRI L4-5: Diffuse disc bulging. Superimposed central disc protrusion. Bilateral facet hypertrophy. Moderate to severe spinal stenosis has progressed in the interval. Subarticular stenosis bilaterally with expected impingement of the L5 nerve root bilaterally. L5-S1: Diffuse disc bulging more prominent on the left than the right. Mild spinal stenosis. Mild left S1 nerve root impingement. IMPRESSION: L2-3 mild spinal stenosis has progressed since 2007 L3-4 moderate spinal stenosis has progressed since prior study Moderate to severe spinal stenosis L4-5 has progressed. There is disc bulging and central disc protrusion. Expected impingement of the L5 nerve root bilaterally. Mild spinal stenosis L5-S1 with mild impingement of the left S1 nerve root. Electronically Signed   By: Franchot Gallo M.D.   On: 05/08/2015 07:08    Assessment & Plan:   Daniel was seen today for hypertension, hyperlipidemia and annual exam.  Diagnoses and all orders for this visit:  Essential hypertension - Her blood pressure is well controlled.  Electrolytes and renal function are normal. -     Comprehensive metabolic panel; Future  Gastroesophageal reflux disease with esophagitis- Her symptoms are well controlled with no medical therapy. -     CBC with Differential/Platelet; Future  Thyroid nodule- There is no palpable nodule at this time and her TFTs are normal. -     Thyroid Panel With TSH; Future  Hyperlipidemia with target LDL less than 130- She has achieved her LDL goal and is doing well on the statin. -     Lipid panel; Future -     Comprehensive metabolic panel; Future  Iron deficiency anemia secondary to inadequate dietary iron intake-improvement noted -     CBC with Differential/Platelet; Future -     IBC panel; Future -     Ferritin; Future  Routine general medical  examination at a health care facility  Prediabetes- Her A1c is down to 5.8%.  She was praised for improvements in her lifestyle modifications. -     Hemoglobin A1c; Future -     Comprehensive metabolic panel; Future  RLS (restless legs syndrome)- She has not been symptomatic with this recently. -     IBC panel; Future -     Ferritin; Future  DEPRESSION/ANXIETY -  ALPRAZolam (XANAX) 0.25 MG tablet; Take 1 tablet (0.25 mg total) by mouth 2 (two) times daily as needed.  Other orders -     albuterol (PROVENTIL HFA;VENTOLIN HFA) 108 (90 Base) MCG/ACT inhaler; Inhale 2 puffs into the lungs every 6 (six) hours as needed for wheezing or shortness of breath.   I have discontinued Tifanny L. Sloop's aspirin, vitamin C, Arginine, CITRULLINE PO, and Albuterol Sulfate. I am also having her start on albuterol. Additionally, I am having her maintain her fish oil-omega-3 fatty acids, clobetasol, cholecalciferol, telmisartan, pravastatin, fluticasone, and ALPRAZolam.  Meds ordered this encounter  Medications  . albuterol (PROVENTIL HFA;VENTOLIN HFA) 108 (90 Base) MCG/ACT inhaler    Sig: Inhale 2 puffs into the lungs every 6 (six) hours as needed for wheezing or shortness of breath.    Dispense:  1 Inhaler    Refill:  3  . ALPRAZolam (XANAX) 0.25 MG tablet    Sig: Take 1 tablet (0.25 mg total) by mouth 2 (two) times daily as needed.    Dispense:  35 tablet    Refill:  2    See AVS for instructions about healthy living and anticipatory guidance.  Follow-up: Return in about 6 months (around 07/08/2018).  Scarlette Calico, MD

## 2018-01-06 ENCOUNTER — Encounter: Payer: Self-pay | Admitting: Internal Medicine

## 2018-01-06 LAB — THYROID PANEL WITH TSH
Free Thyroxine Index: 2.2 (ref 1.4–3.8)
T3 Uptake: 28 % (ref 22–35)
T4, Total: 7.7 ug/dL (ref 5.1–11.9)
TSH: 1.56 mIU/L (ref 0.40–4.50)

## 2018-01-06 LAB — SPECIMEN COMPROMISED

## 2018-03-01 ENCOUNTER — Telehealth: Payer: Self-pay | Admitting: Pulmonary Disease

## 2018-03-01 NOTE — Telephone Encounter (Signed)
Will send over a fax request for this as well.

## 2018-03-01 NOTE — Telephone Encounter (Signed)
Spoke with Kayla Khan, needs copy of most recent sleep study taxed to below verified fax #.  This has been sent as requested.  Nothing further needed.

## 2018-07-04 ENCOUNTER — Encounter: Payer: Self-pay | Admitting: Gastroenterology

## 2018-09-08 ENCOUNTER — Other Ambulatory Visit: Payer: Self-pay | Admitting: Internal Medicine

## 2018-09-08 DIAGNOSIS — I10 Essential (primary) hypertension: Secondary | ICD-10-CM

## 2018-09-08 DIAGNOSIS — I2510 Atherosclerotic heart disease of native coronary artery without angina pectoris: Secondary | ICD-10-CM

## 2018-09-08 DIAGNOSIS — E785 Hyperlipidemia, unspecified: Secondary | ICD-10-CM

## 2018-09-10 ENCOUNTER — Encounter: Payer: Self-pay | Admitting: Internal Medicine

## 2018-09-10 ENCOUNTER — Ambulatory Visit: Payer: Medicare Other | Admitting: Internal Medicine

## 2018-09-10 VITALS — BP 132/78 | HR 80 | Temp 98.2°F | Wt 167.0 lb

## 2018-09-10 DIAGNOSIS — R21 Rash and other nonspecific skin eruption: Secondary | ICD-10-CM | POA: Diagnosis not present

## 2018-09-10 MED ORDER — NYSTATIN-TRIAMCINOLONE 100000-0.1 UNIT/GM-% EX OINT: 1.0000 "application " | TOPICAL_OINTMENT | Freq: Two times a day (BID) | CUTANEOUS | 0 refills | Status: DC

## 2018-09-10 NOTE — Progress Notes (Signed)
Subjective:    Patient ID: Kayla Khan, female    DOB: 12/31/1950, 68 y.o.   MRN: [id number]  DOS:  09/10/2018 Type of visit - description: acute, Saturday clinic Symptoms started a week ago with a very pruritic rash at the skin folds of the lower abdomen. She has use OTC miconazole powder without much success.  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  The area looks better and does not communicate with the present rash.   Review of Systems No fever chills  + weight loss, approximately 30 pounds in the last year, intentional.   Past Medical History:  Diagnosis Date  . Allergic conjunctivitis   . Anemia, iron deficiency   . Angina pectoris   . Arthritis   . Asthma   . 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 Depression with anxiety   . Diverticulosis of colon   . Diverticulosis of colon   . DJD (degenerative joint disease)   . Ejection fraction    70%, catheter 2004 / normal LV function nuclear, 2008 ( no echo data as of December 23, 2010)  . Fatigue   . GERD (gastroesophageal reflux disease)   . HLA B27 positive   . HLD (hyperlipidemia)   . Palpitations   . Sleep apnea   . Thyroid nodule    The patient had a thyroid nodule that was surgically removed in the past.  She has one half of her thyroid and does not take replacement of Taxol and he was benign    Past Surgical History:  Procedure Laterality Date  . APPENDECTOMY    . BREAST BIOPSY    . CESAREAN SECTION     x2  . GANGLION CYST EXCISION    . THYROIDECTOMY, PARTIAL     left  . TUBAL LIGATION    . VESICOVAGINAL FISTULA CLOSURE W/ TAH      Social History   Socioeconomic History  . Marital status: Divorced    Spouse name: Not on file  . Number of children: Not on file  . Years of education: Not on file  . Highest education level: Not on file  Occupational  History  . Occupation: Pharmacist, hospital  Social Needs  . Financial resource strain: Not on file  . Food insecurity:    Worry: Not on file    Inability: Not on file  . Transportation needs:    Medical: Not on file    Non-medical: Not on file  Tobacco Use  . Smoking status: Never Smoker  . Smokeless tobacco: Never Used  Substance and Sexual Activity  . Alcohol use: No  . Drug use: No  . Sexual activity: Not Currently  Lifestyle  . Physical activity:    Days per week: Not on file    Minutes per session: Not on file  . Stress: Not on file  Relationships  . Social connections:    Talks on phone: Not on file    Gets together: Not on file    Attends religious service: Not on file    Active member of club or organization: Not on file    Attends meetings of clubs or organizations: Not on file    Relationship status: Not on file  . Intimate partner violence:    Fear of current or ex partner: Not on file    Emotionally abused: Not on  file    Physically abused: Not on file    Forced sexual activity: Not on file  Other Topics Concern  . Not on file  Social History Narrative  . Not on file      Allergies as of 09/10/2018      Reactions   Amoxicillin-pot Clavulanate    Latex    Meperidine Hcl       Medication List       Accurate as of September 10, 2018 11:59 PM. Always use your most recent med list.        albuterol 108 (90 Base) MCG/ACT inhaler Commonly known as:  PROVENTIL HFA;VENTOLIN HFA Inhale 2 puffs into the lungs every 6 (six) hours as needed for wheezing or shortness of breath.   ALPRAZolam 0.25 MG tablet Commonly known as:  XANAX Take 1 tablet (0.25 mg total) by mouth 2 (two) times daily as needed.   cholecalciferol 1000 units tablet Commonly known as:  VITAMIN D Take 5,000 Units by mouth daily.   clobetasol ointment 0.05 % Commonly known as:  TEMOVATE Apply 1 application topically 2 (two) times daily.   clobetasol 0.05 % topical foam Commonly known as:   OLUX Apply topically 2 (two) times daily.   fish oil-omega-3 fatty acids 1000 MG capsule Take 3 g by mouth daily.   fluticasone 50 MCG/ACT nasal spray Commonly known as:  FLONASE Place 2 sprays into both nostrils daily.   nystatin-triamcinolone ointment Commonly known as:  MYCOLOG Apply 1 application topically 2 (two) times daily.   pravastatin 40 MG tablet Commonly known as:  PRAVACHOL Take 1 tablet (40 mg total) by mouth daily.   telmisartan 40 MG tablet Commonly known as:  MICARDIS Take 1 tablet (40 mg total) by mouth daily.           Objective:   Physical Exam Skin:        BP 132/78   Pulse 80   Temp 98.2 F (36.8 C) (Oral)   Wt 167 lb (75.8 kg)   SpO2 98%   BMI 32.61 kg/m  General:   Well developed, NAD, BMI noted. HEENT:  Normocephalic . Face symmetric, atraumatic Neurologic:  alert & oriented X3.  Speech normal, gait appropriate for age and unassisted Psych--  Cognition and judgment appear intact.  Cooperative with normal attention span and concentration.  Behavior appropriate. No anxious or depressed appearing.      Assessment    68 year old female, multiple medical problems including asthma,CAD, depression anxiety, high cholesterol, HTN, presents with Rrash: Likely fungal Recommend Mycolog twice a day for 1 week Once she improves, keep the area dry with powder. If not better, consider Nizoral, see AVS

## 2018-09-10 NOTE — Patient Instructions (Signed)
Apply day ointment I sent to the pharmacy twice a day for at least 1 week  Once he is better, keep the area dry with powder  If you are not better or the rash resurface, please contact your primary physician.  You may need a different cream.

## 2018-09-12 NOTE — Telephone Encounter (Signed)
Pt called and made med refill OV.  Pt wants to know if she can get her medication filled until she can get in to see PCP.

## 2018-09-12 NOTE — Addendum Note (Signed)
Addended by: Matilde Sprang on: 09/12/2018 10:51 AM   Modules accepted: Orders

## 2018-09-12 NOTE — Telephone Encounter (Signed)
Requested medication (s) are due for refill today: yes  Requested medication (s) are on the active medication list: yes  Last refill:  Previously filled by a different provider  Future visit scheduled: yes, 10/04/18  Notes to clinic:  Previously filled by a different provider. Pt requesting refill until appt on 10/04/18    Requested Prescriptions  Pending Prescriptions Disp Refills   telmisartan (MICARDIS) 40 MG tablet 90 tablet 1    Sig: Take 1 tablet (40 mg total) by mouth daily.     Cardiovascular:  Angiotensin Receptor Blockers Failed - 09/12/2018 10:51 AM      Failed - Cr in normal range and within 180 days    Creatinine, Ser  Date Value Ref Range Status  01/05/2018 0.79 0.40 - 1.20 mg/dL Final         Failed - K in normal range and within 180 days    Potassium  Date Value Ref Range Status  01/05/2018 4.4 3.5 - 5.1 mEq/L Final         Passed - Patient is not pregnant      Passed - Last BP in normal range    BP Readings from Last 1 Encounters:  09/10/18 132/78         Passed - Valid encounter within last 6 months    Recent Outpatient Visits          2 days ago [place], [place]   8 months ago Essential hypertension   [place], Thomas L, MD   8 months ago Allergic rhinitis, unspecified seasonality, unspecified trigger   [place], Marvis Repress, [place]   1 year ago Essential hypertension   [place], Thomas L, MD   1 year ago Essential hypertension   [place], [place], MD      Future Appointments            In 3 weeks Janith Lima, MD [place]         Refused Prescriptions Disp Refills   telmisartan (MICARDIS) 40 MG tablet [Pharmacy Med Name: TELMISARTAN 40MG  TABLETS] 90 tablet 1    Sig: TAKE 1 TABLET(40 MG) BY MOUTH DAILY     Cardiovascular:  Angiotensin Receptor Blockers Failed - 09/12/2018 10:51 AM      Failed - Cr in normal range and within 180 days    Creatinine, Ser  Date Value Ref Range Status  01/05/2018 0.79 0.40 - 1.20 mg/dL Final         Failed - K in normal range and within 180 days    Potassium  Date Value Ref Range Status  01/05/2018 4.4 3.5 - 5.1 mEq/L Final         Passed - Patient is not pregnant      Passed - Last BP in normal range    BP Readings from Last 1 Encounters:  09/10/18 132/78         Passed - Valid encounter within last 6 months    Recent Outpatient Visits          2 days ago [place] Ne[place]on Torres, MD   8 months ago Essential hypertension   [facility] -Mayer Camel, MD   8 months ago Allergic rhinitis, unspecified seasonality, unspecified trigger  [place], Marvis Repress, [place]   1 year ago Essential hypertension   [facility] -Mayer Camel, MD   1 year ago Essential hypertension   [place], [place]      Future Appointments            In 3 weeks Janith Lima, MD [place], [facility]

## 2018-09-13 NOTE — Addendum Note (Signed)
Addended by: Karle Barr on: 09/13/2018 02:23 PM   Modules accepted: Orders

## 2018-09-15 ENCOUNTER — Other Ambulatory Visit: Payer: Self-pay

## 2018-09-15 NOTE — Patient Outreach (Signed)
[facility]) Care Management  09/15/2018  [name] 1951/02/28 [id number]   Medication Adherence call to Mrs. Kayla Khan spoke with patient she is due on Pravastatin 40 mg she explain she is receiving the 40 mg of Pravastatin but she is cutting then in 1/2 and doctor is aware of this she also said she is doing this to save her self some money. Kayla Khan is showing past due under [place].   [place] Management Direct Dial [phone]  Fax [phone] [email or web address]

## 2018-09-16 ENCOUNTER — Other Ambulatory Visit (INDEPENDENT_AMBULATORY_CARE_PROVIDER_SITE_OTHER): Payer: Medicare Other

## 2018-09-16 ENCOUNTER — Encounter: Payer: Self-pay | Admitting: Family

## 2018-09-16 ENCOUNTER — Ambulatory Visit: Payer: Medicare Other | Admitting: Family

## 2018-09-16 VITALS — BP 126/78 | HR 87 | Temp 98.1°F | Ht 60.0 in | Wt 168.0 lb

## 2018-09-16 DIAGNOSIS — E785 Hyperlipidemia, unspecified: Secondary | ICD-10-CM

## 2018-09-16 DIAGNOSIS — L304 Erythema intertrigo: Secondary | ICD-10-CM

## 2018-09-16 DIAGNOSIS — E041 Nontoxic single thyroid nodule: Secondary | ICD-10-CM | POA: Diagnosis not present

## 2018-09-16 DIAGNOSIS — I2510 Atherosclerotic heart disease of native coronary artery without angina pectoris: Secondary | ICD-10-CM

## 2018-09-16 DIAGNOSIS — I10 Essential (primary) hypertension: Secondary | ICD-10-CM

## 2018-09-16 LAB — COMPREHENSIVE METABOLIC PANEL
ALBUMIN: 4.4 g/dL (ref 3.5–5.2)
ALT: 28 U/L (ref 0–35)
AST: 18 U/L (ref 0–37)
Alkaline Phosphatase: 66 U/L (ref 39–117)
BUN: 31 mg/dL — ABNORMAL HIGH (ref 6–23)
CHLORIDE: 104 meq/L (ref 96–112)
CO2: 27 mEq/L (ref 19–32)
CREATININE: 0.74 mg/dL (ref 0.40–1.20)
Calcium: 10.3 mg/dL (ref 8.4–10.5)
GFR: 82.47 mL/min (ref >60.00)
Glucose, Bld: 94 mg/dL (ref 70–99)
Potassium: 4.9 mEq/L (ref 3.5–5.1)
Sodium: 140 mEq/L (ref 135–145)
Total Bilirubin: 0.4 mg/dL (ref 0.2–1.2)
Total Protein: 7.4 g/dL (ref 6.0–8.3)

## 2018-09-16 LAB — CBC WITH DIFFERENTIAL/PLATELET
Basophils Absolute: 0 10*3/uL (ref 0.0–0.1)
Basophils Relative: 0.6 % (ref 0.0–3.0)
Eosinophils Absolute: 0.1 10*3/uL (ref 0.0–0.7)
Eosinophils Relative: 1 % (ref 0.0–5.0)
HCT: 42.5 % (ref 36.0–46.0)
Hemoglobin: 14.3 g/dL (ref 12.0–15.0)
Lymphocytes Relative: 28 % (ref 12.0–46.0)
Lymphs Abs: 2 10*3/uL (ref 0.7–4.0)
MCHC: 33.7 g/dL (ref 30.0–36.0)
MCV: 93.9 fl (ref 78.0–100.0)
Monocytes Absolute: 0.7 10*3/uL (ref 0.1–1.0)
Monocytes Relative: 9.2 % (ref 3.0–12.0)
Neutro Abs: 4.3 10*3/uL (ref 1.4–7.7)
Neutrophils Relative %: 61.2 % (ref 43.0–77.0)
Platelets: 266 10*3/uL (ref 150.0–400.0)
RBC: 4.52 Mil/uL (ref 3.87–5.11)
RDW: 12.8 % (ref 11.5–15.5)
WBC: 7.1 10*3/uL (ref 4.0–10.5)

## 2018-09-16 LAB — HEMOGLOBIN A1C: Hgb A1c MFr Bld: 6.2 % (ref 4.6–6.5)

## 2018-09-16 LAB — LIPID PANEL
Cholesterol: 157 mg/dL (ref 0–200)
HDL: 56.3 mg/dL (ref >39.00)
LDL Cholesterol: 85 mg/dL (ref 0–99)
NonHDL: 101.16
Total CHOL/HDL Ratio: 3
Triglycerides: 82 mg/dL (ref 0.0–149.0)
VLDL: 16.4 mg/dL (ref 0.0–40.0)

## 2018-09-16 LAB — TSH: TSH: 1.89 u[IU]/mL (ref 0.35–4.50)

## 2018-09-16 MED ORDER — FLUCONAZOLE 100 MG PO TABS: 100.0000 mg | ORAL_TABLET | Freq: Every day | ORAL | 0 refills | Status: DC

## 2018-09-16 MED ORDER — TELMISARTAN 40 MG PO TABS: 40.0000 mg | ORAL_TABLET | Freq: Every day | ORAL | 1 refills | Status: DC

## 2018-09-16 MED ORDER — DOXYCYCLINE HYCLATE 100 MG PO TABS: 100.0000 mg | ORAL_TABLET | Freq: Two times a day (BID) | ORAL | 0 refills | Status: DC

## 2018-09-16 NOTE — Progress Notes (Signed)
Kayla Khan is a 68 y.o. female with the following history as recorded in Epic[employer]:  Patient Active Problem List   Diagnosis Date Noted  . Visit for screening mammogram 12/14/2016  . Spinal stenosis of lumbar region with radiculopathy 04/18/2015  . Osteopenia, senile 02/20/2015  . Obesity (BMI 30.0-34.9) 03/22/2013  . RLS (restless legs syndrome) 04/18/2012  . Routine general medical examination at a health care facility 10/19/2011  . Thyroid nodule   . Prediabetes 07/05/2009  . Hyperlipidemia with target LDL less than 130 12/11/2008  . Iron deficiency anemia 05/03/2008  . DEPRESSION/ANXIETY 05/03/2008  . Essential hypertension 05/03/2008  . Asthma 05/03/2008  . GERD 05/03/2008  . DEGENERATIVE JOINT DISEASE 05/03/2008  . OSA (obstructive sleep apnea) 05/03/2008    Current Outpatient Medications  Medication Sig Dispense Refill  . albuterol (PROVENTIL HFA;VENTOLIN HFA) 108 (90 Base) MCG/ACT inhaler Inhale 2 puffs into the lungs every 6 (six) hours as needed for wheezing or shortness of breath. 1 Inhaler 3  . ALPRAZolam (XANAX) 0.25 MG tablet Take 1 tablet (0.25 mg total) by mouth 2 (two) times daily as needed. 35 tablet 2  . cholecalciferol (VITAMIN D) 1000 units tablet Take 5,000 Units by mouth daily.    . clobetasol (OLUX) 0.05 % topical foam Apply topically 2 (two) times daily. 50 g 5  . clobetasol ointment (TEMOVATE) 3.55 % Apply 1 application topically 2 (two) times daily.    . fish oil-omega-3 fatty acids 1000 MG capsule Take 3 g by mouth daily.      . fluticasone (FLONASE) 50 MCG/ACT nasal spray Place 2 sprays into both nostrils daily. 16 g 6  . nystatin-triamcinolone ointment (MYCOLOG) Apply 1 application topically 2 (two) times daily. 120 g 0  . pravastatin (PRAVACHOL) 40 MG tablet Take 1 tablet (40 mg total) by mouth daily. 90 tablet 3  . telmisartan (MICARDIS) 40 MG tablet Take 1 tablet (40 mg total) by mouth daily. 90 tablet 1  . doxycycline (VIBRA-TABS) 100 MG tablet  Take 1 tablet (100 mg total) by mouth 2 (two) times daily. 10 tablet 0  . fluconazole (DIFLUCAN) 100 MG tablet Take 1 tablet (100 mg total) by mouth daily. 10 tablet 0   No current facility-administered medications for this visit.     Allergies: Amoxicillin-pot clavulanate; Latex; and Meperidine hcl  Past Medical History:  Diagnosis Date  . Allergic conjunctivitis   . Anemia, iron deficiency   . Angina pectoris   . Arthritis   . Asthma   . 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 Depression with anxiety   . Diverticulosis of colon   . Diverticulosis of colon   . DJD (degenerative joint disease)   . Ejection fraction    70%, catheter 2004 / normal LV function nuclear, 2008 ( no echo data as of December 23, 2010)  . Fatigue   . GERD (gastroesophageal reflux disease)   . HLA B27 positive   . HLD (hyperlipidemia)   . Palpitations   . Sleep apnea   . Thyroid nodule    The patient had a thyroid nodule that was surgically removed in the past.  She has one half of her thyroid and does not take replacement of Taxol and he was benign    Past Surgical History:  Procedure Laterality Date  . APPENDECTOMY    . BREAST BIOPSY    . CESAREAN SECTION     x2  .  GANGLION CYST EXCISION    . THYROIDECTOMY, PARTIAL     left  . TUBAL LIGATION    . VESICOVAGINAL FISTULA CLOSURE W/ TAH      Family History  Problem Relation Age of Onset  . Cancer Mother   . Hypertension Father   . Aneurysm Father 18       cerebral hemorrhage  . Diabetes Unknown   . Heart disease Unknown   . Cancer Unknown   . Arthritis Unknown   . Hypertension Unknown   . Aneurysm Unknown   . Heart attack Maternal Grandfather   . Stroke Paternal Grandfather   . Stroke Maternal Grandmother   . Cancer Brother     Social History   Tobacco Use  . Smoking status: Never Smoker  . Smokeless tobacco: Never Used  Substance Use Topics  .  Alcohol use: No    Subjective:  Patient was seen last week at [facility] and diagnosed with fungal infection on her abdomen. Was treated with topical Nystatin/ Triamcinolone but has had no improvement in symptoms; Notes she is working with GYN for treatment/ management of lichen sclerosus- has not been as active due to pain and feels this is source of secondary infection; Not diabetic- was pre-diabetic when checked last May;  Also requesting refill on her Micardis- planning to see her PCP in follow-up later this month; would like to get all of her labs updated if possible.     Objective:  Vitals:   09/16/18 1114  BP: 126/78  Pulse: 87  Temp: 98.1 F (36.7 C)  TempSrc: Oral  SpO2: 97%  Weight: 168 lb 0.6 oz (76.2 kg)  Height: 5' (1.524 m)    General: Well developed, well nourished, in no acute distress  Skin : Warm and dry. Macular, erythematous rash noted in pubic region/ under skin fold of lower abdomen- extends across entire lower abdomen Head: Normocephalic and atraumatic  Eyes: Sclera and conjunctiva clear; pupils round and reactive to light; extraocular movements intact  Ears: External normal; canals clear; tympanic membranes normal  Oropharynx: Pink, supple. No suspicious lesions  Neck: Supple without thyromegaly, adenopathy  Lungs: Respirations unlabored;  Neurologic: Alert and oriented; speech intact; face symmetrical; moves all extremities well; CNII-XII intact without focal deficit   Assessment:  1. Intertrigo   2. Hyperlipidemia, unspecified hyperlipidemia type   3. Thyroid nodule   4. Essential hypertension   5. Coronary artery disease involving native coronary artery of native heart without angina pectoris   6. Hyperlipidemia with target LDL less than 130     Plan:  1. Rx for Diflucan 100 mg qd x 10 days; will cover for secondary bacterial infection with 5 days of Doxycycline; update labs including CMP, hgba1c; 2. Update lipid panel; keep planned follow-up  with PCP; 3. Check TSH today; 4. Stable; refill updated on Telmisartan;   No follow-ups on file.  Orders Placed This Encounter  Procedures  . Comp Met (CMET)    Standing Status:   Future    Number of Occurrences:   1    Standing Expiration Date:   09/16/2019  . HgB A1c    Standing Status:   Future    Number of Occurrences:   1    Standing Expiration Date:   09/16/2019  . Lipid panel    Standing Status:   Future    Number of Occurrences:   1    Standing Expiration Date:   09/17/2019  . TSH    Standing  Status:   Future    Number of Occurrences:   1    Standing Expiration Date:   09/16/2019  . CBC w/Diff    Standing Status:   Future    Number of Occurrences:   1    Standing Expiration Date:   09/16/2019    Requested Prescriptions   Signed Prescriptions Disp Refills  . telmisartan (MICARDIS) 40 MG tablet 90 tablet 1    Sig: Take 1 tablet (40 mg total) by mouth daily.  . fluconazole (DIFLUCAN) 100 MG tablet 10 tablet 0    Sig: Take 1 tablet (100 mg total) by mouth daily.  Marland Kitchen doxycycline (VIBRA-TABS) 100 MG tablet 10 tablet 0    Sig: Take 1 tablet (100 mg total) by mouth 2 (two) times daily.

## 2018-09-27 ENCOUNTER — Other Ambulatory Visit: Payer: Self-pay | Admitting: Family

## 2018-09-27 ENCOUNTER — Ambulatory Visit: Payer: Self-pay

## 2018-09-27 MED ORDER — FLUCONAZOLE 100 MG PO TABS: 100.0000 mg | ORAL_TABLET | Freq: Every day | ORAL | 0 refills | Status: DC

## 2018-09-27 NOTE — Telephone Encounter (Signed)
Patient returned call and I advised her of the note by Jodi Mourning below, she verbalized understanding.  Patient says the rash has improved, but is not all the way gone. I advised I will send this to Mickel Baas and according to her note, she will send in the [place], patient verbalized understanding.

## 2018-09-27 NOTE — Telephone Encounter (Signed)
Patient called, left VM to return call to the office. 

## 2018-09-27 NOTE — Telephone Encounter (Signed)
Patient called, left VM to return call to the office.  Summary: skin yeast infection not completely better   Pt calling to speak with a nurse. States that she saw Jodi Mourning on 01/10 for a yeast infection on her stomach. Pt states that she has finished the antibiotic, but the infection is still there. Pt would like to speak with a nurse to know if she needs additional medication or what she can do from here - how long will it take to go away.

## 2018-09-27 NOTE — Telephone Encounter (Addendum)
Please clarify that the rash has improved some? If yes, I am going to extend the Diflucan for 5 more days; keep planned follow-up with Dr. Ronnald Ramp for 1/28 and let him re-check.  If not, would recommend coming in for re-check.

## 2018-10-04 ENCOUNTER — Encounter: Payer: Self-pay | Admitting: Internal Medicine

## 2018-10-04 ENCOUNTER — Ambulatory Visit: Payer: Medicare Other | Admitting: Internal Medicine

## 2018-10-04 VITALS — BP 148/84 | HR 68 | Temp 98.1°F | Resp 16 | Ht 60.0 in | Wt 169.8 lb

## 2018-10-04 DIAGNOSIS — I10 Essential (primary) hypertension: Secondary | ICD-10-CM | POA: Diagnosis not present

## 2018-10-04 DIAGNOSIS — R7303 Prediabetes: Secondary | ICD-10-CM | POA: Diagnosis not present

## 2018-10-04 NOTE — Progress Notes (Signed)
Subjective:  Patient ID: Kayla Khan, female    DOB: [birth year]  Age: 68 y.o. MRN: [id number]  CC: Hypertension   HPI Kayla Khan presents for a BP check - She is doing well on her current antihypertensives.  She has not been making much progress on her lifestyle modifications.  Outpatient Medications Prior to Visit  Medication Sig Dispense Refill  . albuterol (PROVENTIL HFA;VENTOLIN HFA) 108 (90 Base) MCG/ACT inhaler Inhale 2 puffs into the lungs every 6 (six) hours as needed for wheezing or shortness of breath. 1 Inhaler 3  . ALPRAZolam (XANAX) 0.25 MG tablet Take 1 tablet (0.25 mg total) by mouth 2 (two) times daily as needed. 35 tablet 2  . cholecalciferol (VITAMIN D) 1000 units tablet Take 5,000 Units by mouth daily.    . clobetasol ointment (TEMOVATE) 8.56 % Apply 1 application topically 2 (two) times daily.    Marland Kitchen conjugated estrogens (PREMARIN) vaginal cream Place 1 Applicatorful vaginally daily.    . fish oil-omega-3 fatty acids 1000 MG capsule Take 3 g by mouth daily.      . fluticasone (FLONASE) 50 MCG/ACT nasal spray Place 2 sprays into both nostrils daily. 16 g 6  . pravastatin (PRAVACHOL) 40 MG tablet Take 1 tablet (40 mg total) by mouth daily. 90 tablet 3  . telmisartan (MICARDIS) 40 MG tablet Take 1 tablet (40 mg total) by mouth daily. 90 tablet 1  . clobetasol (OLUX) 0.05 % topical foam Apply topically 2 (two) times daily. 50 g 5  . doxycycline (VIBRA-TABS) 100 MG tablet Take 1 tablet (100 mg total) by mouth 2 (two) times daily. 10 tablet 0  . fluconazole (DIFLUCAN) 100 MG tablet Take 1 tablet (100 mg total) by mouth daily. 5 tablet 0  . nystatin-triamcinolone ointment (MYCOLOG) Apply 1 application topically 2 (two) times daily. 120 g 0   No facility-administered medications prior to visit.     ROS Review of Systems  Constitutional: Positive for unexpected weight change (wt gain). Negative for diaphoresis and fatigue.  HENT: Negative.   Eyes: Negative for  visual disturbance.  Respiratory: Negative for cough, chest tightness, shortness of breath and wheezing.   Cardiovascular: Negative for chest pain, palpitations and leg swelling.  Gastrointestinal: Negative for abdominal pain, constipation, diarrhea and nausea.  Genitourinary: Negative for difficulty urinating.  Musculoskeletal: Negative.  Negative for arthralgias and myalgias.  Skin: Negative.  Negative for color change and pallor.  Neurological: Negative.  Negative for dizziness, weakness and light-headedness.  Hematological: Negative for adenopathy. Does not bruise/bleed easily.  Psychiatric/Behavioral: Negative.     Objective:  BP (!) 148/84 (BP Location: Left Arm, Patient Position: Sitting, Cuff Size: Normal)   Pulse 68   Temp 98.1 F (36.7 C) (Oral)   Resp 16   Ht 5' (1.524 m)   Wt 169 lb 12 oz (77 kg)   SpO2 98%   BMI 33.15 kg/m   BP Readings from Last 3 Encounters:  10/04/18 (!) 148/84  09/16/18 126/78  09/10/18 132/78    Wt Readings from Last 3 Encounters:  10/04/18 169 lb 12 oz (77 kg)  09/16/18 168 lb 0.6 oz (76.2 kg)  09/10/18 167 lb (75.8 kg)    Physical Exam Vitals signs reviewed.  Constitutional:      Appearance: She is obese. She is not ill-appearing or diaphoretic.  HENT:     Nose: Nose normal. No congestion or rhinorrhea.     Mouth/Throat:     Mouth: Mucous membranes are moist.  Pharynx: Oropharynx is clear. No oropharyngeal exudate or posterior oropharyngeal erythema.  Eyes:     General: No scleral icterus.    Conjunctiva/sclera: Conjunctivae normal.  Neck:     Musculoskeletal: Normal range of motion and neck supple. No muscular tenderness.  Cardiovascular:     Rate and Rhythm: Normal rate and regular rhythm.     Heart sounds: No murmur. No friction rub. No gallop.   Pulmonary:     Effort: Pulmonary effort is normal.     Breath sounds: No stridor. No wheezing, rhonchi or rales.  Abdominal:     General: Abdomen is flat.     Palpations:  There is no hepatomegaly, splenomegaly or mass.     Tenderness: There is no guarding.  Musculoskeletal: Normal range of motion.        General: No swelling.     Right lower leg: No edema.     Left lower leg: No edema.  Skin:    General: Skin is warm and dry.     Coloration: Skin is not pale.  Neurological:     General: No focal deficit present.     Mental Status: She is oriented to person, place, and time. Mental status is at baseline.     Lab Results  Component Value Date   WBC 7.1 09/16/2018   HGB 14.3 09/16/2018   HCT 42.5 09/16/2018   PLT 266.0 09/16/2018   GLUCOSE 94 09/16/2018   CHOL 157 09/16/2018   TRIG 82.0 09/16/2018   HDL 56.30 09/16/2018   LDLDIRECT 92.0 12/15/2016   LDLCALC 85 09/16/2018   ALT 28 09/16/2018   AST 18 09/16/2018   NA 140 09/16/2018   K 4.9 09/16/2018   CL 104 09/16/2018   CREATININE 0.74 09/16/2018   BUN 31 (H) 09/16/2018   CO2 27 09/16/2018   TSH 1.89 09/16/2018   HGBA1C 6.2 09/16/2018    Mr Lumbar Spine Wo Contrast  Result Date: 05/08/2015 CLINICAL DATA:  Right-sided radiculitis.  Right-sided back pain EXAM: MRI LUMBAR SPINE WITHOUT CONTRAST TECHNIQUE: Multiplanar, multisequence MR imaging of the lumbar spine was performed. No intravenous contrast was administered. COMPARISON:  Lumbar MRI 02/12/2006 FINDINGS: Normal lumbar alignment. Negative for fracture or mass lesion. 15 mm parapelvic cyst left kidney. Conus medullaris normal and terminates at L1-2. L1-2:  Negative L2-3: Diffuse bulging of the disc. Bilateral facet hypertrophy causing mild spinal stenosis. Progression of degenerative change and stenosis since 2007 L3-4: Diffuse disc bulging and mild endplate osteophyte formation. Bilateral facet hypertrophy. Moderate spinal stenosis. Progression of degenerative changes stenosis since the prior MRI L4-5: Diffuse disc bulging. Superimposed central disc protrusion. Bilateral facet hypertrophy. Moderate to severe spinal stenosis has progressed in  the interval. Subarticular stenosis bilaterally with expected impingement of the L5 nerve root bilaterally. L5-S1: Diffuse disc bulging more prominent on the left than the right. Mild spinal stenosis. Mild left S1 nerve root impingement. IMPRESSION: L2-3 mild spinal stenosis has progressed since 2007 L3-4 moderate spinal stenosis has progressed since prior study Moderate to severe spinal stenosis L4-5 has progressed. There is disc bulging and central disc protrusion. Expected impingement of the L5 nerve root bilaterally. Mild spinal stenosis L5-S1 with mild impingement of the left S1 nerve root. Electronically Signed   By: Franchot Gallo M.D.   On: 05/08/2015 07:08    Assessment & Plan:   Kayla Khan was seen today for hypertension.  Diagnoses and all orders for this visit:  Essential hypertension - Her blood pressure is not adequately well controlled.  I have asked her to be more diligent with her lifestyle modifications.  Prediabetes - Her recent A1c was 6.2%.  Medical therapy is not indicated.  I encouraged her to improve her lifestyle modifications in an effort to lose weight.   I have discontinued Kayla Khan's clobetasol, nystatin-triamcinolone ointment, doxycycline, and fluconazole. I am also having her maintain her fish oil-omega-3 fatty acids, cholecalciferol, pravastatin, fluticasone, albuterol, ALPRAZolam, clobetasol ointment, telmisartan, and conjugated estrogens.  No orders of the defined types were placed in this encounter.    Follow-up: No follow-ups on file.  Scarlette Calico, MD

## 2018-10-05 ENCOUNTER — Encounter: Payer: Self-pay | Admitting: Internal Medicine

## 2018-10-05 NOTE — Patient Instructions (Signed)

## 2018-10-11 ENCOUNTER — Other Ambulatory Visit: Payer: Self-pay | Admitting: Physician Assistant

## 2018-11-25 ENCOUNTER — Other Ambulatory Visit: Payer: Self-pay | Admitting: Cardiology

## 2018-11-25 DIAGNOSIS — E785 Hyperlipidemia, unspecified: Secondary | ICD-10-CM

## 2018-11-25 DIAGNOSIS — I2510 Atherosclerotic heart disease of native coronary artery without angina pectoris: Secondary | ICD-10-CM

## 2018-11-25 DIAGNOSIS — I10 Essential (primary) hypertension: Secondary | ICD-10-CM

## 2018-12-01 ENCOUNTER — Encounter: Payer: Self-pay | Admitting: *Deleted

## 2018-12-14 ENCOUNTER — Other Ambulatory Visit: Payer: Self-pay | Admitting: Obstetrics and Gynecology

## 2018-12-14 ENCOUNTER — Other Ambulatory Visit: Payer: Self-pay

## 2018-12-14 ENCOUNTER — Ambulatory Visit
Admission: RE | Admit: 2018-12-14 | Discharge: 2018-12-14 | Disposition: A | Discharge status: Home / Self Care | Payer: Medicare Other | Source: Ambulatory Visit | Attending: Obstetrics and Gynecology | Admitting: Obstetrics and Gynecology

## 2018-12-14 DIAGNOSIS — M898X9 Other specified disorders of bone, unspecified site: Secondary | ICD-10-CM

## 2019-01-11 ENCOUNTER — Telehealth: Payer: Self-pay | Admitting: Physician Assistant

## 2019-01-11 NOTE — Telephone Encounter (Signed)
Patient set up for MyChart?   YES SENT CONSENT Is patient using Smartphone/computer/tablet? SMARTPHONE  Did audio/video work?  Does patient need telephone visit? NO  Best phone number to use? [phone]   Special Instructions? PATIENT WILL HAVE VITALS WHEN RN CALLS      Virtual Visit Pre-Appointment Phone Call  "(Name), I am calling you today to discuss your upcoming appointment. We are currently trying to limit exposure to the virus that causes COVID-19 by seeing patients at home rather than in the office."  1. "What is the BEST phone number to call the day of the visit?" - include this in appointment notes  2. Do you have or have access to (through a family member/friend) a smartphone with video capability that we can use for your visit?" a. If yes - list this number in appt notes as cell (if different from BEST phone #) and list the appointment type as a VIDEO visit in appointment notes b. If no - list the appointment type as a PHONE visit in appointment notes  3. Confirm consent - "In the setting of the current Covid19 crisis, you are scheduled for a (phone or video) visit with your provider on (date) at (time).  Just as we do with many in-office visits, in order for you to participate in this visit, we must obtain consent.  If you'd like, I can send this to your mychart (if signed up) or email for you to review.  Otherwise, I can obtain your verbal consent now.  All virtual visits are billed to your insurance company just like a normal visit would be.  By agreeing to a virtual visit, we'd like you to understand that the technology does not allow for your provider to perform an examination, and thus may limit your provider's ability to fully assess your condition. If your provider identifies any concerns that need to be evaluated in person, we will make arrangements to do so.  Finally, though the technology is pretty good, we cannot assure that it will always work on either your or  our end, and in the setting of a video visit, we may have to convert it to a phone-only visit.  In either situation, we cannot ensure that we have a secure connection.  Are you willing to proceed?" STAFF: Did the patient verbally acknowledge consent to telehealth visit? Document YES/NO here: YES  4. Advise patient to be prepared - "Two hours prior to your appointment, go ahead and check your blood pressure, pulse, oxygen saturation, and your weight (if you have the equipment to check those) and write them all down. When your visit starts, your provider will ask you for this information. If you have an Apple Watch or Kardia device, please plan to have heart rate information ready on the day of your appointment. Please have a pen and paper handy nearby the day of the visit as well."  5. Give patient instructions for MyChart download to smartphone OR [email or web address] as below if video visit (depending on what platform provider is using)  6. Inform patient they will receive a phone call 15 minutes prior to their appointment time (may be from unknown caller ID) so they should be prepared to answer    [place] has been deemed a candidate for a follow-up tele-health visit to limit community exposure during the Covid-19 pandemic. I spoke with the patient via phone to ensure availability of phone/video source, confirm preferred email & phone number, and  discuss instructions and expectations.  I reminded Kayla Khan to be prepared with any vital sign and/or heart rhythm information that could potentially be obtained via home monitoring, at the time of her visit. I reminded Kayla Khan to expect a phone call prior to her visit.  Howie Ill 01/11/2019 4:37 PM   INSTRUCTIONS FOR DOWNLOADING THE MYCHART APP TO SMARTPHONE  - The patient must first make sure to have activated MyChart and know their login information - If Apple, go to [employer] and type in MyChart in the  search bar and download the app. If Android, ask patient to go to [employer] and type in [place] in the search bar and download the app. The app is free but as with any other app downloads, their phone may require them to verify saved payment information or Apple/Android password.  - The patient will need to then log into the app with their MyChart username and password, and select [facility] as their healthcare provider to link the account. When it is time for your visit, go to the MyChart app, find appointments, and click Begin Video Visit. Be sure to Select Allow for your device to access the Microphone and Camera for your visit. You will then be connected, and your provider will be with you shortly.  **If they have any issues connecting, or need assistance please contact MyChart service desk [phone])**  **If using a computer, in order to ensure the best quality for their visit they will need to use either of the following Internet Browsers: [employer], or Google Chrome**  IF USING DOXIMITY or DOXY.ME - The patient will receive a link just prior to their visit by text.     FULL LENGTH CONSENT FOR TELE-HEALTH VISIT   I hereby voluntarily request, consent and authorize [facility] and its employed or contracted physicians, physician assistants, nurse practitioners or other licensed health care professionals (the Practitioner), to provide me with telemedicine health care services (the Services") as deemed necessary by the treating Practitioner. I acknowledge and consent to receive the Services by the Practitioner via telemedicine. I understand that the telemedicine visit will involve communicating with the Practitioner through live audiovisual communication technology and the disclosure of certain medical information by electronic transmission. I acknowledge that I have been given the opportunity to request an in-person assessment or other available alternative prior  to the telemedicine visit and am voluntarily participating in the telemedicine visit.  I understand that I have the right to withhold or withdraw my consent to the use of telemedicine in the course of my care at any time, without affecting my right to future care or treatment, and that the Practitioner or I may terminate the telemedicine visit at any time. I understand that I have the right to inspect all information obtained and/or recorded in the course of the telemedicine visit and may receive copies of available information for a reasonable fee.  I understand that some of the potential risks of receiving the Services via telemedicine include:   Delay or interruption in medical evaluation due to technological equipment failure or disruption;  Information transmitted may not be sufficient (e.g. poor resolution of images) to allow for appropriate medical decision making by the Practitioner; and/or   In rare instances, security protocols could fail, causing a breach of personal health information.  Furthermore, I acknowledge that it is my responsibility to provide information about my medical history, conditions and care that is complete and  accurate to the best of my ability. I acknowledge that Practitioner's advice, recommendations, and/or decision may be based on factors not within their control, such as incomplete or inaccurate data provided by me or distortions of diagnostic images or specimens that may result from electronic transmissions. I understand that the practice of medicine is not an exact science and that Practitioner makes no warranties or guarantees regarding treatment outcomes. I acknowledge that I will receive a copy of this consent concurrently upon execution via email to the email address I last provided but may also request a printed copy by calling the office of [facility].    I understand that my insurance will be billed for this visit.   I have read or had this consent read  to me.  I understand the contents of this consent, which adequately explains the benefits and risks of the Services being provided via telemedicine.   I have been provided ample opportunity to ask questions regarding this consent and the Services and have had my questions answered to my satisfaction.  I give my informed consent for the services to be provided through the use of telemedicine in my medical care  By participating in this telemedicine visit I agree to the above.

## 2019-01-16 ENCOUNTER — Encounter: Payer: Self-pay | Admitting: Physician Assistant

## 2019-01-16 NOTE — Progress Notes (Signed)
Virtual Visit via Video Note   This visit type was conducted due to national recommendations for restrictions regarding the COVID-19 Pandemic (e.g. social distancing) in an effort to limit this patient's exposure and mitigate transmission in our community.  Due to her co-morbid illnesses, this patient is at least at moderate risk for complications without adequate follow up.  This format is felt to be most appropriate for this patient at this time.  All issues noted in this document were discussed and addressed.  A limited physical exam was performed with this format.  Please refer to the patient's chart for her consent to telehealth for [facility].   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 She was remotely followed by Dr. Ron Parker with cardiac testing noted above, and has since been followed by Dr. Meda Coffee. She reports nuclear scan in 2008 was normal. The patient felt symptomatically improved after starting L-citruline and L-arginine. Last labs 09/2018 showed K 4.9, Cr 0.74, BUN 31, LFTs wnl, A1C 6.2, LDL 85, normal CBC and TSH.  She is seen virtually today and doing well. She has not had any recurrent angina since supplements above. Asthma is better controlled since moving into a newer house. She recently changed to facemask instead of CPAP but will be planning on f/u with Dr. Elsworth Soho to discuss  because she thinks she needs to go back to CPAP. Her BP has been running higher lately, 151 systolic last week, and [id number] today. She is under increased stress with pandemic. 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 She is walking 5 days a week without complication. The patient does not have symptoms concerning for COVID-19 infection (fever, chills, cough, or new shortness of breath).    Past Medical History:  Diagnosis Date  . Allergic conjunctivitis   . Anemia, iron deficiency   . Angina pectoris   . Arthritis   . Asthma   . 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 Depression with anxiety   . Diverticulosis of colon   . DJD (degenerative joint disease)   . Ejection fraction    70%, catheter 2004 / normal LV function nuclear, 2008  . Essential hypertension   . Fatigue   . GERD (gastroesophageal reflux disease)   . HLA B27 positive   . HLD (hyperlipidemia)   . Palpitations   . Pre-diabetes   . Sleep apnea   . Thyroid nodule    The patient had a thyroid nodule that was surgically removed in the past.  She has one half of her thyroid and does not take replacement of Taxol and he was benign   Past Surgical History:  Procedure Laterality Date  . APPENDECTOMY    . BREAST BIOPSY    . CESAREAN SECTION     x2  . GANGLION CYST EXCISION    .  THYROIDECTOMY, PARTIAL     left  . TUBAL LIGATION    . VESICOVAGINAL FISTULA CLOSURE W/ TAH       Current Meds  Medication Sig  . albuterol (PROVENTIL HFA;VENTOLIN HFA) 108 (90 Base) MCG/ACT inhaler Inhale 2 puffs into the lungs every 6 (six) hours as needed for wheezing or shortness of breath.  . ALPRAZolam (XANAX) 0.25 MG tablet Take 1 tablet (0.25 mg total) by mouth 2 (two) times daily as needed.  . clobetasol ointment (TEMOVATE) 6.96 % Apply 1 application topically 2 (two) times daily.  Marland Kitchen conjugated estrogens (PREMARIN)  vaginal cream Place 1 Applicatorful vaginally daily.  . fish oil-omega-3 fatty acids 1000 MG capsule Take 3 g by mouth daily.    . fluticasone (FLONASE) 50 MCG/ACT nasal spray Place 2 sprays into both nostrils daily.  . pravastatin (PRAVACHOL) 40 MG tablet Take 1 tablet (40 mg total) by mouth daily. Please schedule appt for future refills.  Marland Kitchen telmisartan (MICARDIS) 40 MG tablet Take 1 tablet (40 mg total) by mouth daily.     Allergies:   Amoxicillin-pot clavulanate; Beta adrenergic blockers; Latex; and Meperidine hcl   Social History   Tobacco Use  . Smoking status: Never Smoker  . Smokeless tobacco: Never Used  Substance Use Topics  . Alcohol use: No  . Drug use: No     Family Hx: The patient's family history includes Aneurysm in an other family member; Aneurysm (age of onset: 73) in her father; Arthritis in an other family member; Cancer in her brother, mother, and another family member; Diabetes in an other family member; Heart attack in her maternal grandfather; Heart disease in an other family member; Hypertension in her father and another family member; Stroke in her maternal grandmother and paternal grandfather.  ROS:   Please see the history of present illness.    All other systems reviewed and are negative.   Prior CV studies:   Most recent pertinent cardiac studies are outlined above.   Labs/Other Tests and Data Reviewed:    EKG:  An ECG dated 11/15/17 was personally reviewed today and demonstrated:  NSR 74bpm no acute STT changes  Recent Labs: 09/16/2018: ALT 28; BUN 31; Creatinine, Ser 0.74; Hemoglobin 14.3; Platelets 266.0; Potassium 4.9; Sodium 140; TSH 1.89   Recent Lipid Panel Lab Results  Component Value Date/Time   CHOL 157 09/16/2018 11:38 AM   TRIG 82.0 09/16/2018 11:38 AM   HDL 56.30 09/16/2018 11:38 AM   CHOLHDL 3 09/16/2018 11:38 AM   LDLCALC 85 09/16/2018 11:38 AM   LDLDIRECT 92.0 12/15/2016 09:34 AM    Wt Readings from Last 3 Encounters:   01/18/19 164 lb 6.4 oz (74.6 kg)  10/04/18 169 lb 12 oz (77 kg)  09/16/18 168 lb 0.6 oz (76.2 kg)     Objective:    Vital Signs:  BP (!) 173/78   Pulse 82   Ht '5\' 4"'  (1.626 m)   Wt 164 lb 6.4 oz (74.6 kg)   BMI 28.22 kg/m    VITAL SIGNS:  reviewed  General - female in no acute distress HEENT - NCAT, EOM intact Pulm - No labored breathing, no coughing during visit, no audible wheezing, speaking in full sentences Neuro - A+Ox3, no slurred speech, answers questions appropriately MSK - moving UE freely by will while talking Psych - Pleasant affect   ASSESSMENT & PLAN:    1. Microvascular angina - well controlled with supplements. Although RCTs have not largely demonstrated a benefit there has  been some anecdotal evidence to suggest improvement so she will continue since she's felt great on this. 2. Essential HTN - running higher. Possibly affected by change in OSA therapy and anxiety. Will increase telmisartan to 33m daily. She will monitor BP daily and send in MyChart readings for review. If BP remains elevated, plan to add amlodipine as next step. Of note she says she cannot tolerate BB as this made CP worse. 3. Hyperlipidemia - this is monitored by primary care. She is tolerating statin without adverse effect. 4. Sleep apnea - followed by pulm. She plans to reach out to them about doing a virtual visit to discuss sleep apnea therapy.  COVID-19 Education: The signs and symptoms of COVID-19 were discussed with the patient and how to seek care for testing (follow up with PCP or arrange E-visit).  The importance of social distancing was discussed today.  Time:   Today, I have spent 18 minutes with the patient with telehealth technology discussing the above problems.     Medication Adjustments/Labs and Tests Ordered: Current medicines are reviewed at length with the patient today.  Concerns regarding medicines are outlined above.   Disposition:  Follow up 1 year with Dr. NMeda Coffee  sooner if symptoms arise  Signed, DCharlie Pitter PA-C  01/18/2019 11:10 AM    [place]

## 2019-01-18 ENCOUNTER — Telehealth (INDEPENDENT_AMBULATORY_CARE_PROVIDER_SITE_OTHER): Payer: Medicare Other | Admitting: Physician Assistant

## 2019-01-18 ENCOUNTER — Encounter: Payer: Self-pay | Admitting: Physician Assistant

## 2019-01-18 ENCOUNTER — Other Ambulatory Visit: Payer: Self-pay

## 2019-01-18 VITALS — BP 173/78 | HR 82 | Ht 64.0 in | Wt 164.4 lb

## 2019-01-18 DIAGNOSIS — E785 Hyperlipidemia, unspecified: Secondary | ICD-10-CM

## 2019-01-18 DIAGNOSIS — G4733 Obstructive sleep apnea (adult) (pediatric): Secondary | ICD-10-CM

## 2019-01-18 DIAGNOSIS — I10 Essential (primary) hypertension: Secondary | ICD-10-CM

## 2019-01-18 DIAGNOSIS — I208 Other forms of angina pectoris: Secondary | ICD-10-CM | POA: Diagnosis not present

## 2019-01-18 MED ORDER — TELMISARTAN 80 MG PO TABS: 80.0000 mg | ORAL_TABLET | Freq: Every day | ORAL | 3 refills | Status: DC

## 2019-01-18 NOTE — Patient Instructions (Signed)
Medication Instructions:  Your physician has recommended you make the following change in your medication:  1.  INCREASE the Telmisartan to 80 mg daily,.  You may take 2 of the 40 mg tablets and use them until you get your new prescription  Monitor your blood pressure at least 3 hours after taking your blood pressure medicine. Send [place] in those readings via MyChart on Friday.   In general our goal for you would be 130/80 or less.   If you need a refill on your cardiac medications before your next appointment, please call your pharmacy.   Lab work: None ordered  If you have labs (blood work) drawn today and your tests are completely normal, you will receive your results only by: Marland Kitchen MyChart Message (if you have MyChart) OR . A paper copy in the mail If you have any lab test that is abnormal or we need to change your treatment, we will call you to review the results.  Testing/Procedures: None ordered  Follow-Up: At [facility], you and your health needs are our priority.  As part of our continuing mission to provide you with exceptional heart care, we have created designated Provider Care Teams.  These Care Teams include your primary Cardiologist (physician) and Advanced Practice Providers (APPs -  Physician Assistants and Nurse Practitioners) who all work together to provide you with the care you need, when you need it. You will need a follow up appointment in 12 months.  Please call our office 2 months in advance to schedule this appointment.  You may see Ena Dawley, MD or one of the following Advanced Practice Providers on your designated Care Team:   [place], PA-C Melina Copa, PA-C . Ermalinda Barrios, PA-C  Any Other Special Instructions Will Be Listed Below (If Applicable).

## 2019-01-23 ENCOUNTER — Ambulatory Visit (INDEPENDENT_AMBULATORY_CARE_PROVIDER_SITE_OTHER): Payer: Medicare Other | Admitting: Nurse Practitioner

## 2019-01-23 ENCOUNTER — Encounter: Payer: Self-pay | Admitting: Nurse Practitioner

## 2019-01-23 ENCOUNTER — Other Ambulatory Visit: Payer: Self-pay

## 2019-01-23 DIAGNOSIS — G4733 Obstructive sleep apnea (adult) (pediatric): Secondary | ICD-10-CM | POA: Diagnosis not present

## 2019-01-23 NOTE — Progress Notes (Signed)
Virtual Visit via Telephone Note  I connected with [place] on 01/23/19 at  1:30 PM EDT by telephone and verified that I am speaking with the correct person using two identifiers.  Location: Patient: home Provider: office   I discussed the limitations, risks, security and privacy concerns of performing an evaluation and management service by telephone and the availability of in person appointments. I also discussed with the patient that there may be a patient responsible charge related to this service. The patient expressed understanding and agreed to proceed.   History of Present Illness: 68 year old female with mild OSA and RLS who is followed by Dr. Elsworth Soho.  Patient has a tele-visit today for a follow-up.  Over the past few months patient has tried an oral device for sleep apnea.  She states that this has not worked well for her.  Not sleeping well with oral device.  She would like to try her CPAP again.  She was previously on CPAP before her trial of oral device.  She states that her CPAP is more than 70 years old and would like a new machine as well as supplies.  States that she has started using her old machine again over the past few days and has noticed significant improvement with her daytime drowsiness.  She states that she is sleeping much better at night.  Denies f/c/s, n/v/d, hemoptysis, PND, leg swelling.    Observations/Objective:  HST 12/08/17 - AHI 14 NPSG 2004:  AHI 10/hr, cpap optimized to 5cm.   Assessment and Plan: Patient has recently tried and failed oral device for sleep apnea.  She would like to return to using her CPAP machine.  She states that unfortunately her CPAP machine is more than 70 years old and she would like to get a new machine and new supplies.  We will order these for her.  Patient Instructions  Will order new CPAP machine CPAP AutoSet 5-15 cm H20 Will order new supplies including full nasal mask Continue CPAP at current settings Continue current  medications Goal of 4 hours or more usage per night Maintain healthy weight Do not drive if drowsy    Follow Up Instructions:  Follow up with Dr. Elsworth Soho in 1 month after starting new machine or sooner if needed   I discussed the assessment and treatment plan with the patient. The patient was provided an opportunity to ask questions and all were answered. The patient agreed with the plan and demonstrated an understanding of the instructions.   The patient was advised to call back or seek an in-person evaluation if the symptoms worsen or if the condition fails to improve as anticipated.  I provided 23 minutes of non-face-to-face time during this encounter.   Fenton Foy, NP

## 2019-01-23 NOTE — Patient Instructions (Addendum)
Will order new CPAP machine CPAP AutoSet 5-15 cm H20 Will order new supplies including full nasal mask Continue CPAP at current settings Continue current medications Goal of 4 hours or more usage per night Maintain healthy weight Do not drive if drowsy  Follow up with Dr. Elsworth Soho in 1 month after starting new machine or sooner if needed

## 2019-01-23 NOTE — Assessment & Plan Note (Signed)
Patient has recently tried and failed oral device for sleep apnea.  She would like to return to using her CPAP machine.  She states that unfortunately her CPAP machine is more than 69 years old and she would like to get a new machine and new supplies.  We will order these for her.  Patient Instructions  Will order new CPAP machine CPAP AutoSet 5-15 cm H20 Will order new supplies including full nasal mask Continue CPAP at current settings Continue current medications Goal of 4 hours or more usage per night Maintain healthy weight Do not drive if drowsy  Follow up with Dr. Elsworth Soho in 1 month after starting new machine or sooner if needed

## 2019-01-24 MED ORDER — HYDROCHLOROTHIAZIDE 25 MG PO TABS: 25.0000 mg | ORAL_TABLET | Freq: Every day | ORAL | 2 refills | Status: DC

## 2019-01-24 NOTE — Telephone Encounter (Signed)
Dr. Meda Coffee would like to add hydrochlorothiazide 25 mg by mouth daily to your regimen. Would you like me to send this to [facility] on [address] and [address]?    Thank you,  Pam, RN   Pt made aware of new medication recommendation per Dr Meda Coffee, for her to start taking HCTZ 25 mg po daily. Confirmed the pharmacy of choice with the pt.  Pt verbalized understanding and agrees with this plan.

## 2019-03-06 LAB — HM MAMMOGRAPHY

## 2019-03-08 ENCOUNTER — Encounter: Payer: Self-pay | Admitting: Internal Medicine

## 2019-03-09 ENCOUNTER — Other Ambulatory Visit: Payer: Self-pay

## 2019-03-09 ENCOUNTER — Ambulatory Visit (INDEPENDENT_AMBULATORY_CARE_PROVIDER_SITE_OTHER): Payer: Medicare Other | Admitting: Internal Medicine

## 2019-03-09 ENCOUNTER — Encounter: Payer: Self-pay | Admitting: Internal Medicine

## 2019-03-09 ENCOUNTER — Other Ambulatory Visit (INDEPENDENT_AMBULATORY_CARE_PROVIDER_SITE_OTHER): Payer: Medicare Other

## 2019-03-09 VITALS — BP 138/74 | HR 95 | Temp 98.0°F | Resp 16 | Ht 64.0 in | Wt 165.0 lb

## 2019-03-09 DIAGNOSIS — E785 Hyperlipidemia, unspecified: Secondary | ICD-10-CM

## 2019-03-09 DIAGNOSIS — I10 Essential (primary) hypertension: Secondary | ICD-10-CM

## 2019-03-09 DIAGNOSIS — F341 Dysthymic disorder: Secondary | ICD-10-CM

## 2019-03-09 DIAGNOSIS — F418 Other specified anxiety disorders: Secondary | ICD-10-CM

## 2019-03-09 DIAGNOSIS — R7303 Prediabetes: Secondary | ICD-10-CM | POA: Diagnosis not present

## 2019-03-09 DIAGNOSIS — Z0000 Encounter for general adult medical examination without abnormal findings: Secondary | ICD-10-CM

## 2019-03-09 LAB — BASIC METABOLIC PANEL
BUN: 28 mg/dL — ABNORMAL HIGH (ref 6–23)
CO2: 28 mEq/L (ref 19–32)
Calcium: 9.6 mg/dL (ref 8.4–10.5)
Chloride: 103 mEq/L (ref 96–112)
Creatinine, Ser: 0.83 mg/dL (ref 0.40–1.20)
GFR: 67.88 mL/min (ref >60.00)
Glucose, Bld: 97 mg/dL (ref 70–99)
Potassium: 4.4 mEq/L (ref 3.5–5.1)
Sodium: 139 mEq/L (ref 135–145)

## 2019-03-09 LAB — HEMOGLOBIN A1C: Hgb A1c MFr Bld: 6 % (ref 4.6–6.5)

## 2019-03-09 MED ORDER — VIIBRYD STARTER PACK 10 & 20 MG PO KIT: 1.0000 | PACK | Freq: Every day | ORAL | 0 refills | Status: DC

## 2019-03-09 MED ORDER — ALPRAZOLAM 0.25 MG PO TABS: 0.2500 mg | ORAL_TABLET | Freq: Two times a day (BID) | ORAL | 2 refills | Status: DC | PRN

## 2019-03-09 NOTE — Patient Instructions (Signed)

## 2019-03-09 NOTE — Progress Notes (Signed)
Subjective:  Patient ID: Kayla Khan, female    DOB: 11-28-1950  Age: 68 y.o. MRN: [id number]  CC: Hypertension, Depression, and Annual Exam   HPI [name] presents for a CPX.  She complains of a several month history of worsening depression and anxiety.  She complains of irritability, anhedonia, and disturbed sleep.  She has a good appetite and complains of weight gain.  She was previously treated with Paxil and had a good response but she complains when she decided to stop taking it taper was long and difficult.  Outpatient Medications Prior to Visit  Medication Sig Dispense Refill  . albuterol (PROVENTIL HFA;VENTOLIN HFA) 108 (90 Base) MCG/ACT inhaler Inhale 2 puffs into the lungs every 6 (six) hours as needed for wheezing or shortness of breath. 1 Inhaler 3  . clobetasol ointment (TEMOVATE) 7.42 % Apply 1 application topically 2 (two) times daily.    Marland Kitchen conjugated estrogens (PREMARIN) vaginal cream Place 1 Applicatorful vaginally daily.    . fish oil-omega-3 fatty acids 1000 MG capsule Take 3 g by mouth daily.      . fluticasone (FLONASE) 50 MCG/ACT nasal spray Place 2 sprays into both nostrils daily. 16 g 6  . pravastatin (PRAVACHOL) 40 MG tablet Take 1 tablet (40 mg total) by mouth daily. Please schedule appt for future refills. 90 tablet 0  . telmisartan (MICARDIS) 80 MG tablet Take 1 tablet (80 mg total) by mouth daily. 90 tablet 3  . ALPRAZolam (XANAX) 0.25 MG tablet Take 1 tablet (0.25 mg total) by mouth 2 (two) times daily as needed. 35 tablet 2  . hydrochlorothiazide (HYDRODIURIL) 25 MG tablet Take 1 tablet (25 mg total) by mouth daily. 90 tablet 2   No facility-administered medications prior to visit.     ROS Review of Systems  Constitutional: Positive for unexpected weight change (wt gain). Negative for appetite change, diaphoresis and fatigue.  HENT: Negative.   Eyes: Negative.   Respiratory: Negative for cough, chest tightness, shortness of breath and  wheezing.   Cardiovascular: Negative for chest pain, palpitations and leg swelling.  Gastrointestinal: Negative for abdominal pain, constipation, diarrhea, nausea and vomiting.  Endocrine: Negative.   Genitourinary: Negative.  Negative for difficulty urinating.  Musculoskeletal: Negative.  Negative for arthralgias and myalgias.  Skin: Negative.   Neurological: Negative.  Negative for dizziness, weakness, light-headedness and headaches.  Hematological: Negative for adenopathy. Does not bruise/bleed easily.  Psychiatric/Behavioral: Positive for dysphoric mood and sleep disturbance. Negative for agitation, behavioral problems, confusion, decreased concentration, self-injury and suicidal ideas. The patient is nervous/anxious. The patient is not hyperactive.     Objective:  BP 138/74 (BP Location: Left Arm, Patient Position: Sitting, Cuff Size: Normal)   Pulse 95   Temp 98 F (36.7 C) (Oral)   Resp 16   Ht 5' 4" (1.626 m)   Wt 165 lb (74.8 kg)   SpO2 99%   BMI 28.32 kg/m   BP Readings from Last 3 Encounters:  03/09/19 138/74  01/18/19 (!) 173/78  10/04/18 (!) 148/84    Wt Readings from Last 3 Encounters:  03/09/19 165 lb (74.8 kg)  01/18/19 164 lb 6.4 oz (74.6 kg)  10/04/18 169 lb 12 oz (77 kg)    Physical Exam Vitals signs reviewed.  Constitutional:      Appearance: She is not ill-appearing or diaphoretic.  HENT:     Nose: Nose normal. No rhinorrhea.     Mouth/Throat:     Mouth: Mucous membranes are moist.  Pharynx: No oropharyngeal exudate or posterior oropharyngeal erythema.  Eyes:     General: No scleral icterus.    Conjunctiva/sclera: Conjunctivae normal.  Neck:     Musculoskeletal: Normal range of motion. No neck rigidity or muscular tenderness.  Cardiovascular:     Rate and Rhythm: Normal rate and regular rhythm.     Heart sounds: No murmur. No gallop.   Pulmonary:     Effort: Pulmonary effort is normal.     Breath sounds: No stridor. No wheezing, rhonchi  or rales.  Abdominal:     General: Abdomen is protuberant. Bowel sounds are normal. There is no distension.     Palpations: There is no hepatomegaly or splenomegaly.     Tenderness: There is no abdominal tenderness.  Genitourinary:    Comments: Breast, GU, rectal exams were deferred at her request. Musculoskeletal: Normal range of motion.        General: No swelling.     Right lower leg: No edema.     Left lower leg: No edema.  Lymphadenopathy:     Cervical: No cervical adenopathy.  Skin:    General: Skin is warm and dry.  Neurological:     General: No focal deficit present.     Mental Status: She is oriented to person, place, and time.  Psychiatric:        Attention and Perception: Attention and perception normal.        Mood and Affect: Mood is anxious and depressed. Affect is not blunt, flat, angry, tearful or inappropriate.        Speech: Speech normal.        Behavior: Behavior normal. Behavior is not slowed, aggressive or withdrawn. Behavior is cooperative.        Thought Content: Thought content normal.        Cognition and Memory: Cognition normal.        Judgment: Judgment normal.     Lab Results  Component Value Date   WBC 7.1 09/16/2018   HGB 14.3 09/16/2018   HCT 42.5 09/16/2018   PLT 266.0 09/16/2018   GLUCOSE 97 03/09/2019   CHOL 157 09/16/2018   TRIG 82.0 09/16/2018   HDL 56.30 09/16/2018   LDLDIRECT 92.0 12/15/2016   LDLCALC 85 09/16/2018   ALT 28 09/16/2018   AST 18 09/16/2018   NA 139 03/09/2019   K 4.4 03/09/2019   CL 103 03/09/2019   CREATININE 0.83 03/09/2019   BUN 28 (H) 03/09/2019   CO2 28 03/09/2019   TSH 1.89 09/16/2018   HGBA1C 6.0 03/09/2019    Dg Pelvis 1-2 Views  Result Date: 12/14/2018 CLINICAL DATA:  Right groin pain EXAM: PELVIS - 1-2 VIEW COMPARISON:  None. FINDINGS: Bone early spurring in the right hip joint. Joint spaces are maintained. SI joints are symmetric and unremarkable. No acute bony abnormality. Specifically, no  fracture, subluxation, or dislocation. IMPRESSION: Early spurring in the right hip joint.  No acute bony abnormality. Electronically Signed   By: Rolm Baptise M.D.   On: 12/14/2018 12:16    Assessment & Plan:   Colena was seen today for hypertension, depression and annual exam.  Diagnoses and all orders for this visit:  Essential hypertension- Her blood pressure is not quite adequately well controlled.  She is not willing to add another agent.  She will continue taking the ARB and will continue working on her lifestyle modifications. -     Basic metabolic panel; Future  Hyperlipidemia with target LDL less than 130-  She has achieved her LDL goal is doing well on the statin.  Prediabetes- Her A1c is at 6.0%.  Medical therapy is not indicated.  She agrees to improve her lifestyle modifications. -     Basic metabolic panel; Future -     Hemoglobin A1c; Future  Routine general medical examination at a health care facility-  Exam completed, labs reviewed, vaccines reviewed, patient education material was given.  Depression with anxiety- I recommended that she start taking Viibryd for this. -     Vilazodone HCl (VIIBRYD STARTER PACK) 10 & 20 MG KIT; Take 1 tablet by mouth daily. -     ALPRAZolam (XANAX) 0.25 MG tablet; Take 1 tablet (0.25 mg total) by mouth 2 (two) times daily as needed.  DEPRESSION/ANXIETY   I have discontinued Thurley L. Pendergraph's hydrochlorothiazide. I am also having her start on [employer]. Additionally, I am having her maintain her fish oil-omega-3 fatty acids, fluticasone, albuterol, clobetasol ointment, conjugated estrogens, pravastatin, telmisartan, and ALPRAZolam.  Meds ordered this encounter  Medications  . Vilazodone HCl (VIIBRYD STARTER PACK) 10 & 20 MG KIT    Sig: Take 1 tablet by mouth daily.    Dispense:  1 kit    Refill:  0  . ALPRAZolam (XANAX) 0.25 MG tablet    Sig: Take 1 tablet (0.25 mg total) by mouth 2 (two) times daily as needed.     Dispense:  35 tablet    Refill:  2     Follow-up: Return in about 6 months (around 09/09/2019).  Scarlette Calico, MD

## 2019-03-10 ENCOUNTER — Encounter: Payer: Self-pay | Admitting: Internal Medicine

## 2019-04-04 ENCOUNTER — Other Ambulatory Visit: Payer: Self-pay

## 2019-04-04 ENCOUNTER — Encounter: Payer: Self-pay | Admitting: Nurse Practitioner

## 2019-04-04 ENCOUNTER — Ambulatory Visit (INDEPENDENT_AMBULATORY_CARE_PROVIDER_SITE_OTHER): Payer: Medicare Other | Admitting: Nurse Practitioner

## 2019-04-04 DIAGNOSIS — G4733 Obstructive sleep apnea (adult) (pediatric): Secondary | ICD-10-CM

## 2019-04-04 NOTE — Progress Notes (Signed)
Virtual Visit via Telephone Note  I connected with Kayla Khan on 04/04/19 at 10:30 AM EDT by telephone and verified that I am speaking with the correct person using two identifiers.  Location: Patient: home Provider: office   I discussed the limitations, risks, security and privacy concerns of performing an evaluation and management service by telephone and the availability of in person appointments. I also discussed with the patient that there may be a patient responsible charge related to this service. The patient expressed understanding and agreed to proceed.   History of Present Illness: 68 year old female with mild OSA and RLS who is followed by Dr. Elsworth Soho.  Patient has a tele-visit today for follow-up on new CPAP machine.  Patient has previously trialed dental device and failed.  She states that her new CPAP has been working great for her.  She states that she is sleeping better and feels much better throughout the day.  She states that her blood pressure has improved since using her CPAP machine.  Denies any issues with pressure or mask fit. Denies f/c/s, n/v/d, hemoptysis, PND, leg swelling.     Observations/Objective: HST 12/08/17 - AHI 14 NPSG 2004:  AHI 10/hr, cpap optimized to 5 cmH20  CPAP compliance report 03/04/19 - 04/02/19: usage days 30/30 (100%), average usage 6 hours 52 minutes, CPAP Auto Set 5-15 cm H20, AHI 1.6   Assessment and Plan: Patient has a tele-visit today for follow-up on new CPAP machine.  Patient has previously trialed dental device and failed.  She states that her new CPAP has been working great for her.  She states that she is sleeping better and feels much better throughout the day.  She states that her blood pressure has improved since using her CPAP machine.  Denies any issues with pressure or mask fit.  Patient Instructions  Patient continues to benefit from CPAP with good compliance and control documented Continue CPAP at current settings Continue  current medications Goal of 4 hours or more usage per night Maintain healthy weight Do not drive if drowsy    Follow Up Instructions: Follow up with Dr. Elsworth Soho in 1 year or sooner if needed    I discussed the assessment and treatment plan with the patient. The patient was provided an opportunity to ask questions and all were answered. The patient agreed with the plan and demonstrated an understanding of the instructions.   The patient was advised to call back or seek an in-person evaluation if the symptoms worsen or if the condition fails to improve as anticipated.  I provided 22 minutes of non-face-to-face time during this encounter.   Fenton Foy, NP

## 2019-04-04 NOTE — Assessment & Plan Note (Signed)
Patient has a tele-visit today for follow-up on new CPAP machine.  Patient has previously trialed dental device and failed.  She states that her new CPAP has been working great for her.  She states that she is sleeping better and feels much better throughout the day.  She states that her blood pressure has improved since using her CPAP machine.  Denies any issues with pressure or mask fit.  Patient Instructions  Patient continues to benefit from CPAP with good compliance and control documented Continue CPAP at current settings Continue current medications Goal of 4 hours or more usage per night Maintain healthy weight Do not drive if drowsy  Follow up with Dr. Elsworth Soho in 1 year or sooner if needed

## 2019-04-04 NOTE — Patient Instructions (Signed)
Patient continues to benefit from CPAP with good compliance and control documented Continue CPAP at current settings Continue current medications Goal of 4 hours or more usage per night Maintain healthy weight Do not drive if drowsy  Follow up with Dr. Elsworth Soho in 1 year or sooner if needed

## 2019-05-03 ENCOUNTER — Encounter: Payer: Self-pay | Admitting: Internal Medicine

## 2019-07-03 ENCOUNTER — Other Ambulatory Visit: Payer: Self-pay | Admitting: Cardiology

## 2019-07-03 DIAGNOSIS — E785 Hyperlipidemia, unspecified: Secondary | ICD-10-CM

## 2019-07-03 DIAGNOSIS — I10 Essential (primary) hypertension: Secondary | ICD-10-CM

## 2019-07-03 DIAGNOSIS — I2510 Atherosclerotic heart disease of native coronary artery without angina pectoris: Secondary | ICD-10-CM

## 2019-07-05 ENCOUNTER — Other Ambulatory Visit: Payer: Self-pay | Admitting: Cardiology

## 2019-07-05 DIAGNOSIS — I2510 Atherosclerotic heart disease of native coronary artery without angina pectoris: Secondary | ICD-10-CM

## 2019-07-05 DIAGNOSIS — E785 Hyperlipidemia, unspecified: Secondary | ICD-10-CM

## 2019-07-05 DIAGNOSIS — I10 Essential (primary) hypertension: Secondary | ICD-10-CM

## 2019-07-05 MED ORDER — PRAVASTATIN SODIUM 40 MG PO TABS: 40.0000 mg | ORAL_TABLET | Freq: Every day | ORAL | 1 refills | Status: DC

## 2019-07-26 ENCOUNTER — Other Ambulatory Visit: Payer: Self-pay

## 2019-07-26 DIAGNOSIS — Z20822 Contact with and (suspected) exposure to covid-19: Secondary | ICD-10-CM

## 2019-07-28 LAB — NOVEL CORONAVIRUS, NAA: SARS-CoV-2, NAA: NOT DETECTED

## 2019-10-05 ENCOUNTER — Ambulatory Visit: Payer: Medicare Other

## 2019-10-13 ENCOUNTER — Ambulatory Visit: Payer: Medicare PPO | Attending: Internal Medicine

## 2019-10-13 DIAGNOSIS — Z23 Encounter for immunization: Secondary | ICD-10-CM | POA: Insufficient documentation

## 2019-10-13 NOTE — Progress Notes (Signed)
   Covid-19 Vaccination Clinic  Name:  Kayla Khan    MRN: [id number] DOB: 01/08/1951  10/13/2019  [name] was observed post Covid-19 immunization for 15 minutes without incidence. She was provided with Vaccine Information Sheet and instruction to access the V-Safe system.   [name] was instructed to call 911 with any severe reactions post vaccine: Marland Kitchen Difficulty breathing  . Swelling of your face and throat  . A fast heartbeat  . A bad rash all over your body  . Dizziness and weakness    Immunizations Administered    Name Date Dose VIS Date Route   Pfizer COVID-19 Vaccine 10/13/2019  1:55 PM 0.3 mL 08/18/2019 Intramuscular   Manufacturer: [place]   Lot: [id number]   [place]: [id number]

## 2019-10-18 DIAGNOSIS — G4733 Obstructive sleep apnea (adult) (pediatric): Secondary | ICD-10-CM | POA: Diagnosis not present

## 2019-10-24 DIAGNOSIS — G4733 Obstructive sleep apnea (adult) (pediatric): Secondary | ICD-10-CM | POA: Diagnosis not present

## 2019-10-26 ENCOUNTER — Ambulatory Visit: Payer: Medicare Other

## 2019-11-07 ENCOUNTER — Ambulatory Visit: Payer: Medicare PPO | Attending: Internal Medicine

## 2019-11-07 DIAGNOSIS — Z23 Encounter for immunization: Secondary | ICD-10-CM | POA: Insufficient documentation

## 2019-11-07 NOTE — Progress Notes (Signed)
   Covid-19 Vaccination Clinic  Name:  PERSIS BRAUNER    MRN: [id number] DOB: 1951/04/14  11/07/2019  Ms. Casique was observed post Covid-19 immunization for 15 minutes without incident. She was provided with Vaccine Information Sheet and instruction to access the V-Safe system.   Ms. Mcferrin was instructed to call 911 with any severe reactions post vaccine: Marland Kitchen Difficulty breathing  . Swelling of face and throat  . A fast heartbeat  . A bad rash all over body  . Dizziness and weakness   Immunizations Administered    Name Date Dose VIS Date Route   Pfizer COVID-19 Vaccine 11/07/2019  2:08 PM 0.3 mL 08/18/2019 Intramuscular   Manufacturer: [place]   Lot: [id number]   [place]: [id number]

## 2019-11-08 ENCOUNTER — Encounter: Payer: Self-pay | Admitting: *Deleted

## 2019-11-15 DIAGNOSIS — G4733 Obstructive sleep apnea (adult) (pediatric): Secondary | ICD-10-CM | POA: Diagnosis not present

## 2019-11-23 ENCOUNTER — Encounter: Payer: Self-pay | Admitting: Physician Assistant

## 2019-11-23 ENCOUNTER — Ambulatory Visit: Payer: Medicare PPO | Admitting: Physician Assistant

## 2019-11-23 ENCOUNTER — Other Ambulatory Visit: Payer: Self-pay

## 2019-11-23 DIAGNOSIS — L570 Actinic keratosis: Secondary | ICD-10-CM

## 2019-11-23 DIAGNOSIS — L820 Inflamed seborrheic keratosis: Secondary | ICD-10-CM

## 2019-11-23 DIAGNOSIS — Z1283 Encounter for screening for malignant neoplasm of skin: Secondary | ICD-10-CM

## 2019-11-23 NOTE — Progress Notes (Signed)
   Follow-Up Visit   Subjective  Kayla Khan is a 69 y.o. female who presents for the following: Annual Exam (spot on scalp, nose chest and left cheek last visit 10/11/2018).  The following portions of the chart were reviewed this encounter and updated as appropriate: Tobacco  Allergies  Meds  Problems  Med Hx  Surg Hx  Fam Hx      Review of Systems: No other skin or systemic complaints.  Objective  Well appearing patient in no apparent distress; mood and affect are within normal limits.  A full examination was performed including scalp, head, eyes, ears, nose, lips, neck, chest, axillae, abdomen, back, buttocks, bilateral upper extremities, bilateral lower extremities, hands, feet, fingers, toes, fingernails, and toenails. All findings within normal limits unless otherwise noted below. No atypical nevi noted at the time of the visit.  Objective  Left Parotid Area, Right Nasal Sidewall: Erythematous patches with gritty scale.  Objective  Mid Occipital Scalp, Right Breast: Erythematous stuck-on, waxy papule or plaque.   Assessment & Plan  AK (actinic keratosis) (2) Right Nasal Sidewall; Left Parotid Area  Destruction of lesion - Left Parotid Area, Right Nasal Sidewall Complexity: simple   Destruction method: cryotherapy   Informed consent: discussed and consent obtained   Timeout:  patient name, date of birth, surgical site, and procedure verified Lesion destroyed using liquid nitrogen: Yes   Cryotherapy cycles:  1 Outcome: patient tolerated procedure well with no complications   Post-procedure details: wound care instructions given    Seborrheic keratosis, inflamed (2) Right Breast; Mid Occipital Scalp  Destruction of lesion - Mid Occipital Scalp, Right Breast Complexity: simple   Destruction method: cryotherapy   Informed consent: discussed and consent obtained   Timeout:  patient name, date of birth, surgical site, and procedure verified Lesion destroyed  using liquid nitrogen: Yes   Cryotherapy cycles:  1 Outcome: patient tolerated procedure well with no complications   Post-procedure details: wound care instructions given

## 2019-11-23 NOTE — Patient Instructions (Addendum)
Wound Care Instructions  1. Cleanse would gently with soap and water once a day then pat dry with clean gauze. Apply a thing coat of Petrolatum (petroleum jelly, "Vaseline") over the wound (unless you have an allergy to this). We recommend that you use a new, sterile tube of Vaseline. Do not pick or remove scabs. Do not remove the yellow or white "healing tissue" from the base of the wound.  2. Cover the wound with fresh, clean, nonstick gauze and secure with paper tape. You may use Band-Aids in place of gauze and tape if the would is small enough, but would recommend trimming much of the tape off as there is often too much. Sometimes Band-Aids can irritate the skin.  3. You should call the office for your biopsy report after 1 week if you have not already been contacted.  4. If you experience any problems, such as abnormal amounts of bleeding, swelling, significant bruising, significant pain, or evidence of infection, please call the office immediately.  5. FOR ADULT SURGERY PATIENTS: If you need something for pain relief you may take 1 extra strength Tylenol (acetaminophen) AND 2 Ibuprofen (200mg  each) together every 4 hours as needed for pain. (do not take these if you are allergic to them or if you have a reason you should not take them.) Typically, you may only need pain medication for 1 to 3 days.    Wound Care Instructions  6. Cleanse would gently with soap and water once a day then pat dry with clean gauze. Apply a thing coat of Petrolatum (petroleum jelly, "Vaseline") over the wound (unless you have an allergy to this). We recommend that you use a new, sterile tube of Vaseline. Do not pick or remove scabs. Do not remove the yellow or white "healing tissue" from the base of the wound.  7. Cover the wound with fresh, clean, nonstick gauze and secure with paper tape. You may use Band-Aids in place of gauze and tape if the would is small enough, but would recommend trimming much of the tape off  as there is often too much. Sometimes Band-Aids can irritate the skin.  8. You should call the office for your biopsy report after 1 week if you have not already been contacted.  9. If you experience any problems, such as abnormal amounts of bleeding, swelling, significant bruising, significant pain, or evidence of infection, please call the office immediately.  10. FOR ADULT SURGERY PATIENTS: If you need something for pain relief you may take 1 extra strength Tylenol (acetaminophen) AND 2 Ibuprofen (200mg  each) together every 4 hours as needed for pain. (do not take these if you are allergic to them or if you have a reason you should not take them.) Typically, you may only need pain medication for 1 to 3 days.

## 2019-11-28 DIAGNOSIS — H2513 Age-related nuclear cataract, bilateral: Secondary | ICD-10-CM | POA: Diagnosis not present

## 2019-11-28 DIAGNOSIS — H04123 Dry eye syndrome of bilateral lacrimal glands: Secondary | ICD-10-CM | POA: Diagnosis not present

## 2019-11-28 DIAGNOSIS — H43813 Vitreous degeneration, bilateral: Secondary | ICD-10-CM | POA: Diagnosis not present

## 2019-11-28 DIAGNOSIS — H524 Presbyopia: Secondary | ICD-10-CM | POA: Diagnosis not present

## 2019-12-16 DIAGNOSIS — G4733 Obstructive sleep apnea (adult) (pediatric): Secondary | ICD-10-CM | POA: Diagnosis not present

## 2019-12-18 DIAGNOSIS — L900 Lichen sclerosus et atrophicus: Secondary | ICD-10-CM | POA: Diagnosis not present

## 2019-12-18 DIAGNOSIS — M8588 Other specified disorders of bone density and structure, other site: Secondary | ICD-10-CM | POA: Diagnosis not present

## 2019-12-18 DIAGNOSIS — N958 Other specified menopausal and perimenopausal disorders: Secondary | ICD-10-CM | POA: Diagnosis not present

## 2019-12-18 DIAGNOSIS — Z01419 Encounter for gynecological examination (general) (routine) without abnormal findings: Secondary | ICD-10-CM | POA: Diagnosis not present

## 2019-12-18 DIAGNOSIS — Z6830 Body mass index (BMI) 30.0-30.9, adult: Secondary | ICD-10-CM | POA: Diagnosis not present

## 2020-01-15 DIAGNOSIS — G4733 Obstructive sleep apnea (adult) (pediatric): Secondary | ICD-10-CM | POA: Diagnosis not present

## 2020-01-19 ENCOUNTER — Encounter: Payer: Self-pay | Admitting: Physician Assistant

## 2020-01-19 ENCOUNTER — Other Ambulatory Visit: Payer: Self-pay | Admitting: Cardiology

## 2020-01-19 ENCOUNTER — Other Ambulatory Visit: Payer: Self-pay | Admitting: Physician Assistant

## 2020-01-19 DIAGNOSIS — I10 Essential (primary) hypertension: Secondary | ICD-10-CM

## 2020-01-19 DIAGNOSIS — E785 Hyperlipidemia, unspecified: Secondary | ICD-10-CM

## 2020-01-19 DIAGNOSIS — I2510 Atherosclerotic heart disease of native coronary artery without angina pectoris: Secondary | ICD-10-CM

## 2020-01-22 DIAGNOSIS — G4733 Obstructive sleep apnea (adult) (pediatric): Secondary | ICD-10-CM | POA: Diagnosis not present

## 2020-02-06 ENCOUNTER — Ambulatory Visit: Payer: Medicare PPO | Admitting: Internal Medicine

## 2020-02-06 ENCOUNTER — Other Ambulatory Visit: Payer: Self-pay

## 2020-02-06 ENCOUNTER — Encounter: Payer: Self-pay | Admitting: Internal Medicine

## 2020-02-06 VITALS — BP 120/80 | HR 84 | Temp 98.7°F | Ht 64.0 in | Wt 174.0 lb

## 2020-02-06 DIAGNOSIS — I10 Essential (primary) hypertension: Secondary | ICD-10-CM

## 2020-02-06 DIAGNOSIS — E785 Hyperlipidemia, unspecified: Secondary | ICD-10-CM | POA: Diagnosis not present

## 2020-02-06 DIAGNOSIS — B356 Tinea cruris: Secondary | ICD-10-CM | POA: Diagnosis not present

## 2020-02-06 DIAGNOSIS — R7303 Prediabetes: Secondary | ICD-10-CM

## 2020-02-06 DIAGNOSIS — K2100 Gastro-esophageal reflux disease with esophagitis, without bleeding: Secondary | ICD-10-CM | POA: Diagnosis not present

## 2020-02-06 DIAGNOSIS — D508 Other iron deficiency anemias: Secondary | ICD-10-CM | POA: Diagnosis not present

## 2020-02-06 DIAGNOSIS — Z1211 Encounter for screening for malignant neoplasm of colon: Secondary | ICD-10-CM

## 2020-02-06 LAB — CBC WITH DIFFERENTIAL/PLATELET
Basophils Absolute: 0 10*3/uL (ref 0.0–0.1)
Basophils Relative: 0.7 % (ref 0.0–3.0)
Eosinophils Absolute: 0.1 10*3/uL (ref 0.0–0.7)
Eosinophils Relative: 1.9 % (ref 0.0–5.0)
HCT: 39.8 % (ref 36.0–46.0)
Hemoglobin: 13.5 g/dL (ref 12.0–15.0)
Lymphocytes Relative: 28 % (ref 12.0–46.0)
Lymphs Abs: 1.5 10*3/uL (ref 0.7–4.0)
MCHC: 34 g/dL (ref 30.0–36.0)
MCV: 93.3 fl (ref 78.0–100.0)
Monocytes Absolute: 0.4 10*3/uL (ref 0.1–1.0)
Monocytes Relative: 7.8 % (ref 3.0–12.0)
Neutro Abs: 3.3 10*3/uL (ref 1.4–7.7)
Neutrophils Relative %: 61.6 % (ref 43.0–77.0)
Platelets: 234 10*3/uL (ref 150.0–400.0)
RBC: 4.27 Mil/uL (ref 3.87–5.11)
RDW: 13.1 % (ref 11.5–15.5)
WBC: 5.3 10*3/uL (ref 4.0–10.5)

## 2020-02-06 LAB — LIPID PANEL
Cholesterol: 154 mg/dL (ref 0–200)
HDL: 41.7 mg/dL (ref >39.00)
LDL Cholesterol: 96 mg/dL (ref 0–99)
NonHDL: 112.24
Total CHOL/HDL Ratio: 4
Triglycerides: 80 mg/dL (ref 0.0–149.0)
VLDL: 16 mg/dL (ref 0.0–40.0)

## 2020-02-06 LAB — BASIC METABOLIC PANEL
BUN: 26 mg/dL — ABNORMAL HIGH (ref 6–23)
CO2: 25 mEq/L (ref 19–32)
Calcium: 9.5 mg/dL (ref 8.4–10.5)
Chloride: 108 mEq/L (ref 96–112)
Creatinine, Ser: 0.73 mg/dL (ref 0.40–1.20)
GFR: 78.51 mL/min (ref >60.00)
Glucose, Bld: 103 mg/dL — ABNORMAL HIGH (ref 70–99)
Potassium: 4.2 mEq/L (ref 3.5–5.1)
Sodium: 141 mEq/L (ref 135–145)

## 2020-02-06 LAB — HEPATIC FUNCTION PANEL
ALT: 16 U/L (ref 0–35)
AST: 14 U/L (ref 0–37)
Albumin: 4.3 g/dL (ref 3.5–5.2)
Alkaline Phosphatase: 66 U/L (ref 39–117)
Bilirubin, Direct: 0.1 mg/dL (ref 0.0–0.3)
Total Bilirubin: 0.4 mg/dL (ref 0.2–1.2)
Total Protein: 7.3 g/dL (ref 6.0–8.3)

## 2020-02-06 LAB — HEMOGLOBIN A1C: Hgb A1c MFr Bld: 5.8 % (ref 4.6–6.5)

## 2020-02-06 LAB — TSH: TSH: 1.8 u[IU]/mL (ref 0.35–4.50)

## 2020-02-06 MED ORDER — CLOBETASOL PROPIONATE 0.05 % EX OINT: 1.0000 "application " | TOPICAL_OINTMENT | Freq: Two times a day (BID) | CUTANEOUS | 2 refills | Status: AC

## 2020-02-06 MED ORDER — NYSTATIN-TRIAMCINOLONE 100000-0.1 UNIT/GM-% EX OINT: 1.0000 "application " | TOPICAL_OINTMENT | Freq: Two times a day (BID) | CUTANEOUS | 1 refills | Status: DC

## 2020-02-06 NOTE — Patient Instructions (Signed)

## 2020-02-06 NOTE — Progress Notes (Signed)
Subjective:  Patient ID: Kayla Khan, female    DOB: 08/26/1951  Age: 69 y.o. MRN: [id number]  CC: Rash, Hypertension, Hyperlipidemia, and Anemia  This visit occurred during the SARS-CoV-2 public health emergency.  Safety protocols were in place, including screening questions prior to the visit, additional usage of staff PPE, and extensive cleaning of exam room while observing appropriate contact time as indicated for disinfecting solutions.    HPI Kayla Khan presents for f/up.  She tells me her blood pressure has been well controlled.  She can walk long distances without experience chest pain, shortness of breath, palpitations, edema, or fatigue.  She does complain of weight gain.  She complains of recurrent rash in her groin.  Outpatient Medications Prior to Visit  Medication Sig Dispense Refill  . ALPRAZolam (XANAX) 0.25 MG tablet Take 1 tablet (0.25 mg total) by mouth 2 (two) times daily as needed. 35 tablet 2  . arginine 500 MG tablet Take 2,000 mg by mouth in the morning, at noon, and at bedtime.     . Ascorbic Acid (VITAMIN C) 1000 MG tablet Take 1,000 mg by mouth daily.    Marland Kitchen aspirin EC 81 MG tablet Take 81 mg by mouth daily.    Marland Kitchen co-enzyme Q-10 30 MG capsule Take 100 mg by mouth daily.    Marland Kitchen conjugated estrogens (PREMARIN) vaginal cream Place 1 Applicatorful vaginally daily.    . fish oil-omega-3 fatty acids 1000 MG capsule Take 3 g by mouth daily.      . Multiple Vitamin (MULTIVITAMIN) tablet Take 1 tablet by mouth daily.    . pravastatin (PRAVACHOL) 40 MG tablet Take 1 tablet (40 mg total) by mouth daily. Pt needs to make appt with provider for more refills - 1st attempt 90 tablet 0  . S-Adenosylmethionine (SAM-E) 400 MG TABS Take 400 mg by mouth 2 (two) times daily.    Marland Kitchen telmisartan (MICARDIS) 80 MG tablet Take 1 tablet (80 mg total) by mouth daily. Pt needs to make appt with provider to receive more refills - 1st attempt 90 tablet 0  . clobetasol ointment (TEMOVATE)  [id number] % Apply 1 application topically 2 (two) times daily.    . diphenhydrAMINE (BENADRYL) 25 mg capsule Take 25 mg by mouth every 6 (six) hours as needed.    . calcium-vitamin D (OSCAL WITH D) 500-200 MG-UNIT tablet Take 1,000 tablets by mouth.     No facility-administered medications prior to visit.    ROS Review of Systems  Constitutional: Positive for unexpected weight change. Negative for appetite change, chills, diaphoresis and fatigue.  HENT: Negative.   Eyes: Negative.   Respiratory: Negative for cough, chest tightness, shortness of breath and wheezing.   Cardiovascular: Negative for chest pain, palpitations and leg swelling.  Gastrointestinal: Negative for abdominal pain, constipation, diarrhea, nausea and vomiting.  Endocrine: Negative.   Genitourinary: Negative.  Negative for difficulty urinating.  Musculoskeletal: Negative for arthralgias and myalgias.  Skin: Negative.  Negative for color change and pallor.  Neurological: Negative.  Negative for dizziness, weakness, light-headedness, numbness and headaches.  Hematological: Negative for adenopathy. Does not bruise/bleed easily.  Psychiatric/Behavioral: Negative.     Objective:  BP 120/80 (BP Location: Right Arm, Patient Position: Sitting, Cuff Size: Large)   Pulse 84   Temp 98.7 F (37.1 C) (Oral)   Ht 5\' 4"  (1.626 m)   Wt 174 lb (78.9 kg)   SpO2 96%   BMI 29.87 kg/m   BP Readings from Last 3 Encounters:  02/06/20 120/80  03/09/19 138/74  01/18/19 (!) 173/78    Wt Readings from Last 3 Encounters:  02/06/20 174 lb (78.9 kg)  03/09/19 165 lb (74.8 kg)  01/18/19 164 lb 6.4 oz (74.6 kg)    Physical Exam Vitals reviewed.  Constitutional:      Appearance: Normal appearance. She is obese.  HENT:     Nose: Nose normal.     Mouth/Throat:     Mouth: Mucous membranes are moist.     Pharynx: No oropharyngeal exudate.  Eyes:     General: No scleral icterus.    Conjunctiva/sclera: Conjunctivae normal.    Cardiovascular:     Rate and Rhythm: Normal rate and regular rhythm.     Heart sounds: No murmur.  Pulmonary:     Effort: Pulmonary effort is normal.     Breath sounds: No stridor. No wheezing, rhonchi or rales.  Abdominal:     General: Abdomen is protuberant. Bowel sounds are normal. There is no distension.     Palpations: Abdomen is soft. There is no hepatomegaly, splenomegaly or mass.     Tenderness: There is no abdominal tenderness.    Musculoskeletal:     Cervical back: Neck supple.  Lymphadenopathy:     Cervical: No cervical adenopathy.  Skin:    General: Skin is warm and dry.     Findings: Erythema and rash present.  Neurological:     General: No focal deficit present.     Mental Status: She is alert.  Psychiatric:        Mood and Affect: Mood normal.        Behavior: Behavior normal.     Lab Results  Component Value Date   WBC 5.3 02/06/2020   HGB 13.5 02/06/2020   HCT 39.8 02/06/2020   PLT 234.0 02/06/2020   GLUCOSE 103 (H) 02/06/2020   CHOL 154 02/06/2020   TRIG 80.0 02/06/2020   HDL 41.70 02/06/2020   LDLDIRECT 92.0 12/15/2016   LDLCALC 96 02/06/2020   ALT 16 02/06/2020   AST 14 02/06/2020   NA 141 02/06/2020   K 4.2 02/06/2020   CL 108 02/06/2020   CREATININE 0.73 02/06/2020   BUN 26 (H) 02/06/2020   CO2 25 02/06/2020   TSH 1.80 02/06/2020   HGBA1C 5.8 02/06/2020    DG Pelvis 1-2 Views  Result Date: 12/14/2018 CLINICAL DATA:  Right groin pain EXAM: PELVIS - 1-2 VIEW COMPARISON:  None. FINDINGS: Bone early spurring in the right hip joint. Joint spaces are maintained. SI joints are symmetric and unremarkable. No acute bony abnormality. Specifically, no fracture, subluxation, or dislocation. IMPRESSION: Early spurring in the right hip joint.  No acute bony abnormality. Electronically Signed   By: Rolm Baptise M.D.   On: 12/14/2018 12:16    Assessment & Plan:   Kayla Khan was seen today for rash, hypertension, hyperlipidemia and anemia.  Diagnoses and  all orders for this visit:  Essential hypertension- Her blood pressure is well controlled.  Electrolytes and renal function are normal. -     Basic metabolic panel; Future -     TSH; Future -     TSH -     Basic metabolic panel  Gastroesophageal reflux disease with esophagitis without hemorrhage- She is asymptomatic with respect to this.  No complications noted. -     CBC with Differential/Platelet; Future -     CBC with Differential/Platelet  Hyperlipidemia with target LDL less than 130- She has achieved her LDL goal and is  doing well on the statin. -     Lipid panel; Future -     Hepatic function panel; Future -     TSH; Future -     TSH -     Hepatic function panel -     Lipid panel  Iron deficiency anemia secondary to inadequate dietary iron intake- Her H&H are normal now. -     CBC with Differential/Platelet; Future -     CBC with Differential/Platelet  Prediabetes- Her A1c is down to 5.8%.  Medical therapy is not indicated. -     Basic metabolic panel; Future -     Hemoglobin A1c; Future -     Hemoglobin A1c -     Basic metabolic panel  Tinea cruris -     clobetasol ointment (TEMOVATE) 0.05 %; Apply 1 application topically 2 (two) times daily. -     nystatin-triamcinolone ointment (MYCOLOG); Apply 1 application topically 2 (two) times daily.  Colon cancer screening -     Cologuard   I have discontinued Monetta L. Olejnik's calcium-vitamin D and diphenhydrAMINE. I am also having her maintain her fish oil-omega-3 fatty acids, conjugated estrogens, ALPRAZolam, aspirin EC, arginine, co-enzyme Q-10, vitamin C, multivitamin, SAM-e, telmisartan, pravastatin, clobetasol ointment, and nystatin-triamcinolone ointment.  Meds ordered this encounter  Medications  . clobetasol ointment (TEMOVATE) 0.05 %    Sig: Apply 1 application topically 2 (two) times daily.    Dispense:  30 g    Refill:  2  . nystatin-triamcinolone ointment (MYCOLOG)    Sig: Apply 1 application topically 2  (two) times daily.    Dispense:  120 g    Refill:  1   I spent 50 minutes in preparing to see the patient by review of recent labs, imaging and procedures, obtaining and reviewing separately obtained history, communicating with the patient and family or caregiver, ordering medications, tests or procedures, and documenting clinical information in the EHR including the differential Dx, treatment, and any further evaluation and other management of 1. Essential hypertension 2. Gastroesophageal reflux disease with esophagitis without hemorrhage 3. Hyperlipidemia with target LDL less than 130 4. Iron deficiency anemia secondary to inadequate dietary iron intake 5. Prediabetes 6. Tinea cruris    Follow-up: Return in about 3 months (around 05/08/2020).  Scarlette Calico, MD

## 2020-02-07 ENCOUNTER — Encounter: Payer: Self-pay | Admitting: Internal Medicine

## 2020-02-15 DIAGNOSIS — G4733 Obstructive sleep apnea (adult) (pediatric): Secondary | ICD-10-CM | POA: Diagnosis not present

## 2020-02-20 DIAGNOSIS — Z1211 Encounter for screening for malignant neoplasm of colon: Secondary | ICD-10-CM | POA: Diagnosis not present

## 2020-02-23 DIAGNOSIS — H903 Sensorineural hearing loss, bilateral: Secondary | ICD-10-CM | POA: Diagnosis not present

## 2020-02-24 LAB — COLOGUARD
COLOGUARD: NEGATIVE
Cologuard: NEGATIVE

## 2020-02-24 LAB — EXTERNAL GENERIC LAB PROCEDURE: COLOGUARD: NEGATIVE

## 2020-02-25 NOTE — Progress Notes (Addendum)
Cardiology Office Note    Date:  02/26/2020   ID:  DORLEEN KISSEL, DOB 20-Feb-1951, MRN [id number]  PCP:  Janith Lima, MD  Cardiologist:  Ena Dawley, MD  Electrophysiologist:  None   Chief Complaint: follow-up microvascular angina, discuss changes in symptoms  History of Present Illness:   DELORA GRAVATT is a 69 y.o. 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 She was remotely followed by Dr. Ron Parker with cardiac testing noted above, and has since been followed by Dr. Meda Coffee. She reports nuclear scan in 2008 was normal. The patient felt symptomatically improved after starting L-citruline and L-arginine. 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 Last labs personally reviewed 01/2020 normal CBC, K 4.2, Cr 0.73, BUN 26, LFTs wnl, LDL 96, A1C 5.8, normal TSH.  She is seen back for follow-up today. She does report that ever since the spring of this year she has noticed discomfort in her left upper arm with exertion, similar to the symptoms she initially had that prompted cath and workup. She also feels a discomfort in her chest but no radiation to jaw or neck. No diaphoresis. She has noticed DOE as well. She increased her arginine from 2071m BID to 30042mBID with some improvement. She states she has a history of asthma so has been difficult to sort out what's heart/lung in the past. She recently moved out of a home into a new one and felt her asthma had improved with this measure. Therefore the exertional discomfort was somewhat concerning to her. No family history of premature CAD. She has not had any rest pain.   Past Medical History:  Diagnosis Date  . Allergic  conjunctivitis   . Anemia, iron deficiency   . Angina pectoris   . Arthritis   . Asthma   . 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 Depression with anxiety   . Diverticulosis of colon   . DJD (degenerative joint disease)   . Ejection fraction    70%, catheter 2004 / normal LV function nuclear, 2008  . Essential hypertension   . Fatigue   . GERD (gastroesophageal reflux disease)   . HLA B27 positive   . HLD (hyperlipidemia)   . Palpitations   . Pre-diabetes   . Sleep apnea   . Thyroid nodule    The patient had a thyroid nodule that was surgically removed in the past.  She has one half of her thyroid and does not take replacement of Taxol and he was benign    Past Surgical History:  Procedure Laterality Date  . APPENDECTOMY    . BREAST BIOPSY    . CESAREAN SECTION     x2  . GANGLION CYST EXCISION    . THYROIDECTOMY, PARTIAL     left  . TUBAL LIGATION    . VESICOVAGINAL FISTULA CLOSURE W/ TAH      Current Medications: Current Meds  Medication Sig  . ALPRAZolam (XANAX) 0.25 MG tablet Take 1 tablet (0.25 mg total) by mouth 2 (two) times daily as needed.  . Marland Kitchenrginine 500 MG tablet Take 3,000 mg by mouth in the morning and at bedtime.   .Marland Kitchen  Ascorbic Acid (VITAMIN C) 1000 MG tablet Take 1,000 mg by mouth daily.  Marland Kitchen aspirin EC 81 MG tablet Take 81 mg by mouth daily.  . Cholecalciferol (VITAMIN D) 50 MCG (2000 UT) tablet Take 2,000 Units by mouth daily.  . clobetasol ointment (TEMOVATE) 4.08 % Apply 1 application topically 2 (two) times daily.  Marland Kitchen co-enzyme Q-10 30 MG capsule Take 100 mg by mouth daily.  Marland Kitchen conjugated estrogens (PREMARIN) vaginal cream Place 1 Applicatorful vaginally 2 (two) times a week.   . fish oil-omega-3 fatty acids 1000 MG capsule Take 3 g by mouth daily.    Marland Kitchen nystatin-triamcinolone ointment (MYCOLOG) Apply 1 application topically as needed (yeast infection).  .  pravastatin (PRAVACHOL) 40 MG tablet Take 1 tablet (40 mg total) by mouth daily. Pt needs to make appt with provider for more refills - 1st attempt  . S-Adenosylmethionine (SAM-E) 400 MG TABS Take 400 mg by mouth 2 (two) times daily.  Marland Kitchen telmisartan (MICARDIS) 80 MG tablet Take 1 tablet (80 mg total) by mouth daily. Pt needs to make appt with provider to receive more refills - 1st attempt  . [DISCONTINUED] Multiple Vitamin (MULTIVITAMIN) tablet Take 1 tablet by mouth daily.  . [DISCONTINUED] nystatin-triamcinolone ointment (MYCOLOG) Apply 1 application topically 2 (two) times daily. (Patient taking differently: Apply 1 application topically as needed. )      Allergies:   Amoxicillin-pot clavulanate, Beta adrenergic blockers, Latex, Meperidine hcl, and Hctz [hydrochlorothiazide]   Social History   Socioeconomic History  . Marital status: Divorced    Spouse name: Not on file  . Number of children: Not on file  . Years of education: Not on file  . Highest education level: Not on file  Occupational History  . Occupation: Pharmacist, hospital  Tobacco Use  . Smoking status: Never Smoker  . Smokeless tobacco: Never Used  Substance and Sexual Activity  . Alcohol use: No  . Drug use: No  . Sexual activity: Not Currently  Other Topics Concern  . Not on file  Social History Narrative  . Not on file   Social Determinants of Health   Financial Resource Strain:   . Difficulty of Paying Living Expenses:   Food Insecurity:   . Worried About Charity fundraiser in the Last Year:   . Arboriculturist in the Last Year:   Transportation Needs:   . Film/video editor (Medical):   Marland Kitchen Lack of Transportation (Non-Medical):   Physical Activity:   . Days of Exercise per Week:   . Minutes of Exercise per Session:   Stress:   . Feeling of Stress :   Social Connections:   . Frequency of Communication with Friends and Family:   . Frequency of Social Gatherings with Friends and Family:   . Attends Religious  Services:   . Active Member of Clubs or Organizations:   . Attends Archivist Meetings:   Marland Kitchen Marital Status:      Family History:  The patient's family history includes Aneurysm in an other family member; Aneurysm (age of onset: 47) in her father; Arthritis in an other family member; Cancer in her brother, mother, and another family member; Diabetes in an other family member; Heart attack in her maternal grandfather; Heart disease in an other family member; Hypertension in her father and another family member; Stroke in her maternal grandmother and paternal grandfather.  ROS:   Please see the history of present illness. Occasional dizziness when bending over in the garden.  No syncope. All other systems are reviewed and otherwise negative.    EKGs/Labs/Other Studies Reviewed:    Studies reviewed are outlined and summarized above.   EKG:  EKG is ordered today, personally reviewed, demonstrating NSR 75bpm NSST changes similar to prior.  Recent Labs: 02/06/2020: ALT 16; BUN 26; Creatinine, Ser 0.73; Hemoglobin 13.5; Platelets 234.0; Potassium 4.2; Sodium 141; TSH 1.80  Recent Lipid Panel    Component Value Date/Time   CHOL 154 02/06/2020 0843   TRIG 80.0 02/06/2020 0843   HDL 41.70 02/06/2020 0843   CHOLHDL 4 02/06/2020 0843   VLDL 16.0 02/06/2020 0843   LDLCALC 96 02/06/2020 0843   LDLDIRECT 92.0 12/15/2016 0934    PHYSICAL EXAM:    VS:  BP 130/72   Pulse 75   Ht '5\' 4"'  (1.626 m)   Wt 176 lb (79.8 kg)   SpO2 98%   BMI 30.21 kg/m   BMI: Body mass index is 30.21 kg/m.  GEN: Well nourished, well developed WF, in no acute distress HEENT: normocephalic, atraumatic Neck: no JVD, carotid bruits, or masses Cardiac: RRR; no murmurs, rubs, or gallops, no edema  Respiratory:  clear to auscultation bilaterally, normal work of breathing GI: soft, nontender, nondistended, + BS MS: no deformity or atrophy Skin: warm and dry, no rash Neuro:  Alert and Oriented x 3, Strength  and sensation are intact, follows commands Psych: euthymic mood, full affect  Wt Readings from Last 3 Encounters:  02/26/20 176 lb (79.8 kg)  02/06/20 174 lb (78.9 kg)  03/09/19 165 lb (74.8 kg)     ASSESSMENT & PLAN:   1. Exertional angina/dyspnea superimposed on history of microvascular angina - labs in 01/2020 were negative for any acute abnormality such as anemia. We will undertake a coronary CTA for further evaluation. Per protocol will obtain pre-CT BMET. She is intolerant to beta blockers. Per discussion with Dr. Meda Coffee will have her pre-treat with ivabradine 73m 2 hours prior to CT. If she is unable to obtain this for any reason, Dr. NMeda Coffeefelt diltiazem 672m(IR) would be a suitable alternative, also 2 hours before scan. If CT is unrevealing, would suggest obtaining a 2D echocardiogram for completeness.  2. Essential HTN - systolic BP upper limits of normal but still within reasonable control. She has history of dizziness when bending over in the past so will err on conservative side. Continue present regimen. 3. Hyperlipidemia - followed by primary care. The CT will also allow us[place]oreao evaluate for atherosclerosis which will infer decisions about cholesterol medication. LDL was 96 so if coronary calcification is present, would consider titration of pravastatin to more potent agent.  Disposition: Per discussion with patient's preference, will arrange virtual follow-up after coronary CT is scheduled.  Medication Adjustments/Labs and Tests Ordered: Current medicines are reviewed at length with the patient today.  Concerns regarding medicines are outlined above. Medication changes, Labs and Tests ordered today are summarized above and listed in the Patient Instructions accessible in Encounters.   Signed, DaCharlie PitterPA-C  02/26/2020 12:42 PM    [facility] [address]  [id number]: [phone]: [phone]

## 2020-02-26 ENCOUNTER — Ambulatory Visit: Payer: Medicare PPO | Admitting: Physician Assistant

## 2020-02-26 ENCOUNTER — Other Ambulatory Visit: Payer: Self-pay

## 2020-02-26 ENCOUNTER — Encounter: Payer: Self-pay | Admitting: Physician Assistant

## 2020-02-26 VITALS — BP 130/72 | HR 75 | Ht 64.0 in | Wt 176.0 lb

## 2020-02-26 DIAGNOSIS — R0600 Dyspnea, unspecified: Secondary | ICD-10-CM | POA: Diagnosis not present

## 2020-02-26 DIAGNOSIS — I208 Other forms of angina pectoris: Secondary | ICD-10-CM

## 2020-02-26 DIAGNOSIS — I10 Essential (primary) hypertension: Secondary | ICD-10-CM

## 2020-02-26 DIAGNOSIS — E785 Hyperlipidemia, unspecified: Secondary | ICD-10-CM | POA: Diagnosis not present

## 2020-02-26 DIAGNOSIS — R0609 Other forms of dyspnea: Secondary | ICD-10-CM

## 2020-02-26 MED ORDER — IVABRADINE HCL 5 MG PO TABS: ORAL_TABLET | ORAL | 0 refills | Status: DC

## 2020-02-26 NOTE — Patient Instructions (Addendum)
Medication Instructions:  Your physician recommends that you continue on your current medications as directed. Please refer to the Current Medication list given to you today.  *If you need a refill on your cardiac medications before your next appointment, please call your pharmacy*   Lab Work: BEFORE THE CT:  BMET (they will tell you when they call to schedule the CT when to have it done)  If you have labs (blood work) drawn today and your tests are completely normal, you will receive your results only by: Marland Kitchen MyChart Message (if you have MyChart) OR . A paper copy in the mail If you have any lab test that is abnormal or we need to change your treatment, we will call you to review the results.   Testing/Procedures: Your physician has requested that you have cardiac CT. Cardiac computed tomography (CT) is a painless test that uses an x-ray machine to take clear, detailed pictures of your heart. For further information please visit [email or web address]. Please follow instruction sheet BELOW:  Your cardiac CT will be scheduled at one of the below locations:   [facility] [address] [place], [place] [address] [phone]  At [facility], please arrive at the [facility] main entrance of [facility] 30 minutes prior to test start time. Proceed to the [facility] Radiology Department (first floor) to check-in and test prep.  Please follow these instructions carefully (unless otherwise directed):  On the Night Before the Test: . Be sure to Drink plenty of water. . Do not consume any caffeinated/decaffeinated beverages or chocolate 12 hours prior to your test. . Do not take any antihistamines 12 hours prior to your test.   On the Day of the Test: . Drink plenty of water. Do not drink any water within one hour of the test. . Do not eat any food 4 hours prior to the test. . You may take your regular medications prior to the test.  . Take Corlanor 5 mg take  BOTH TABLETS two hours prior to test. . FEMALES- please wear underwire-free bra if available       After the Test: . Drink plenty of water. . After receiving IV contrast, you may experience a mild flushed feeling. This is normal. . On occasion, you may experience a mild rash up to 24 hours after the test. This is not dangerous. If this occurs, you can take Benadryl 25 mg and increase your fluid intake. . If you experience trouble breathing, this can be serious. If it is severe call 911 IMMEDIATELY. If it is mild, please call our office. . If you take any of these medications: Glipizide/Metformin, Avandament, Glucavance, please do not take 48 hours after completing test unless otherwise instructed.   Once we have confirmed authorization from your insurance company, we will call you to set up a date and time for your test.   For non-scheduling related questions, please contact the cardiac imaging nurse navigator should you have any questions/concerns: Marchia Bond, Cardiac Imaging Nurse Navigator Burley Saver, Interim Cardiac Imaging Nurse [place] [facility] Direct Office Dial: [phone]   For scheduling needs, including cancellations and rescheduling, please call [phone].          Follow-Up: At [facility], you and your health needs are our priority.  As part of our continuing mission to provide you with exceptional heart care, we have created designated Provider Care Teams.  These Care Teams include your primary Cardiologist (physician)  and Advanced Practice Providers (APPs -  Physician Assistants and Nurse Practitioners) who all work together to provide you with the care you need, when you need it.  We recommend signing up for the patient portal called "MyChart".  Sign up information is provided on this After Visit Summary.  MyChart is used to connect with patients for Virtual Visits (Telemedicine).  Patients are able to view lab/test results,  encounter notes, upcoming appointments, etc.  Non-urgent messages can be sent to your provider as well.   To learn more about what you can do with MyChart, go to [email or web address].    Your next appointment:   12 month(s)  The format for your next appointment:   In Person  Provider:   You may see Ena Dawley, MD or one of the following Advanced Practice Providers on your designated Care Team:    Melina Copa, PA-C  Ermalinda Barrios, PA-C    Other Instructions

## 2020-03-01 ENCOUNTER — Encounter: Payer: Self-pay | Admitting: Internal Medicine

## 2020-03-01 ENCOUNTER — Other Ambulatory Visit: Payer: Self-pay | Admitting: Internal Medicine

## 2020-03-01 DIAGNOSIS — K2100 Gastro-esophageal reflux disease with esophagitis, without bleeding: Secondary | ICD-10-CM

## 2020-03-01 MED ORDER — FAMOTIDINE 40 MG PO TABS: 40.0000 mg | ORAL_TABLET | Freq: Every day | ORAL | 1 refills | Status: DC

## 2020-03-04 ENCOUNTER — Encounter: Payer: Self-pay | Admitting: Internal Medicine

## 2020-03-07 ENCOUNTER — Telehealth: Payer: Self-pay | Admitting: *Deleted

## 2020-03-07 DIAGNOSIS — Z1231 Encounter for screening mammogram for malignant neoplasm of breast: Secondary | ICD-10-CM | POA: Diagnosis not present

## 2020-03-07 NOTE — Telephone Encounter (Signed)
-----   Message from Charlie Pitter, [place] sent at 03/07/2020  9:38 AM EDT ----- Regarding: RE: Virtual Visit Can offer with someone else if needed, or plan August virtual. Thanks  ----- Message ----- From: Jeanann Lewandowsky, RMA Sent: 03/06/2020  12:27 PM EDT To: Charlie Pitter, PA-C Subject: RE: Virtual Visit                              Pt is scheduled for 7/19, however you are not available for a virtual until August. We have very slim pickings around that time. ----- Message ----- From: Felipa Evener Sent: 02/26/2020  12:50 PM EDT To: Jeanann Lewandowsky, RMA Subject: Virtual Visit                                  I talked to Ms. Pitner about a virtual visit after her CT scan vs just a 1 year f/u. She would like the former. I didn't know when the CT would be scheduled and she's going out of town I think 7/9-7/14. Once we know when CT is scheduled, can we get her a virtual f/u after the scan with me? She did video visit last year and did well with it. Dayna

## 2020-03-07 NOTE — Telephone Encounter (Signed)
Call placed to pt re: needing a virtual f/u appt with 1st available after CT 7/16. If pt calls back, please schedule appt.

## 2020-03-12 ENCOUNTER — Encounter: Payer: Self-pay | Admitting: *Deleted

## 2020-03-12 NOTE — Telephone Encounter (Signed)
Pt called in and was scheduled for a in-office visit.  I sent her a mychart message to offer to change to virtual if she would like, since that was the plan from the beginning.

## 2020-03-12 NOTE — Telephone Encounter (Signed)
Per response on mychart, pt would rather an in person office visit.

## 2020-03-16 DIAGNOSIS — G4733 Obstructive sleep apnea (adult) (pediatric): Secondary | ICD-10-CM | POA: Diagnosis not present

## 2020-03-21 ENCOUNTER — Telehealth (HOSPITAL_COMMUNITY): Payer: Self-pay | Admitting: *Deleted

## 2020-03-21 NOTE — Telephone Encounter (Signed)

## 2020-03-22 ENCOUNTER — Other Ambulatory Visit: Payer: Medicare PPO | Admitting: *Deleted

## 2020-03-22 ENCOUNTER — Other Ambulatory Visit: Payer: Self-pay

## 2020-03-22 DIAGNOSIS — I208 Other forms of angina pectoris: Secondary | ICD-10-CM

## 2020-03-22 LAB — BASIC METABOLIC PANEL
BUN/Creatinine Ratio: 23 (ref 12–28)
BUN: 18 mg/dL (ref 8–27)
CO2: 21 mmol/L (ref 20–29)
Calcium: 9.4 mg/dL (ref 8.7–10.3)
Chloride: 110 mmol/L — ABNORMAL HIGH (ref 96–106)
Creatinine, Ser: 0.79 mg/dL (ref 0.57–1.00)
GFR calc Af Amer: 87 mL/min/{1.73_m2} (ref >59)
GFR calc non Af Amer: 76 mL/min/{1.73_m2} (ref >59)
Glucose: 97 mg/dL (ref 65–99)
Potassium: 4.3 mmol/L (ref 3.5–5.2)
Sodium: 144 mmol/L (ref 134–144)

## 2020-03-25 ENCOUNTER — Other Ambulatory Visit: Payer: Self-pay

## 2020-03-25 ENCOUNTER — Ambulatory Visit (HOSPITAL_COMMUNITY)
Admission: RE | Admit: 2020-03-25 | Discharge: 2020-03-25 | Disposition: A | Discharge status: Home / Self Care | Payer: Medicare PPO | Source: Ambulatory Visit | Attending: Physician Assistant | Admitting: Physician Assistant

## 2020-03-25 DIAGNOSIS — I208 Other forms of angina pectoris: Secondary | ICD-10-CM

## 2020-03-25 MED ORDER — NITROGLYCERIN 0.4 MG SL SUBL
0.8000 mg | SUBLINGUAL_TABLET | Freq: Once | SUBLINGUAL | Status: AC
  Administered 2020-03-25: 0.8 mg via SUBLINGUAL

## 2020-03-25 MED ORDER — NITROGLYCERIN 0.4 MG SL SUBL
SUBLINGUAL_TABLET | SUBLINGUAL | Status: AC
  Filled 2020-03-25: qty 2

## 2020-03-25 MED ORDER — DILTIAZEM HCL 25 MG/5ML IV SOLN
INTRAVENOUS | Status: AC
  Filled 2020-03-25: qty 5

## 2020-03-25 MED ORDER — IOHEXOL 350 MG/ML SOLN
80.0000 mL | Freq: Once | INTRAVENOUS | Status: AC | PRN
  Administered 2020-03-25: 80 mL via INTRAVENOUS

## 2020-03-25 MED ORDER — DILTIAZEM HCL 25 MG/5ML IV SOLN
5.0000 mg | Freq: Once | INTRAVENOUS | Status: DC
  Filled 2020-03-25: qty 5

## 2020-03-26 ENCOUNTER — Telehealth: Payer: Self-pay | Admitting: *Deleted

## 2020-03-26 DIAGNOSIS — Z79899 Other long term (current) drug therapy: Secondary | ICD-10-CM

## 2020-03-26 MED ORDER — ROSUVASTATIN CALCIUM 20 MG PO TABS
20.0000 mg | ORAL_TABLET | Freq: Every day | ORAL | 3 refills | Status: DC
Start: 2020-03-26 — End: 2020-05-20

## 2020-03-26 NOTE — Telephone Encounter (Signed)
-----   Message from Charlie Pitter, [place] sent at 03/26/2020  2:42 PM EDT ----- Please let pt know overall coronary CT was reassuring - minimal, non-blocking plaque noted, not impairing blood flow. She does have elevated calcium score with some mild atherosclerosis, therefore thinking ahead to future risk reduction, would be beneficial to make sure her cholesterol is controlled under a stricter goal LDL of less than 70. This was 96 by last check in June by PCP so would recommend switching pravastatin to rosuvastatin 20mg  daily with recheck LFTs/lipids in 6-8 weeks. Otherwise the CT did not show any suspicious lung nodules or masses which is great news.  Please find out how she is feeling. If still having that exertional discomfort/limitation would set up for a 2D echocardiogram and keep f/u as planned.

## 2020-04-01 ENCOUNTER — Ambulatory Visit: Payer: Medicare PPO | Admitting: Internal Medicine

## 2020-04-01 ENCOUNTER — Other Ambulatory Visit: Payer: Self-pay

## 2020-04-01 ENCOUNTER — Encounter: Payer: Self-pay | Admitting: Internal Medicine

## 2020-04-01 VITALS — BP 138/76 | HR 93 | Temp 98.6°F | Resp 16 | Ht 64.0 in | Wt 174.0 lb

## 2020-04-01 DIAGNOSIS — I10 Essential (primary) hypertension: Secondary | ICD-10-CM | POA: Diagnosis not present

## 2020-04-01 DIAGNOSIS — F331 Major depressive disorder, recurrent, moderate: Secondary | ICD-10-CM | POA: Diagnosis not present

## 2020-04-01 MED ORDER — VIIBRYD STARTER PACK 10 & 20 MG PO KIT: 1.0000 | PACK | Freq: Every day | ORAL | 0 refills | Status: DC

## 2020-04-01 NOTE — Progress Notes (Signed)
Subjective:  Patient ID: Kayla Khan, female    DOB: 09-25-1950  Age: 69 y.o. MRN: [id number]  CC: Depression  This visit occurred during the SARS-CoV-2 public health emergency.  Safety protocols were in place, including screening questions prior to the visit, additional usage of staff PPE, and extensive cleaning of exam room while observing appropriate contact time as indicated for disinfecting solutions.    HPI Kayla Khan presents for f/up - She continues to complain of symptoms of anxiety and depression.  She is worried about her son who she thinks has become an alcoholic.  She was around him about 2 months ago and noticed that he was drinking heavily.  For about a week after that she had panic attacks.  She rarely takes Xanax.  When I saw her last I recommended that she start taking Viibryd but she never did.  She complains of sadness, insomnia with frequent awakenings and early morning awakenings, weight gain, and feeling hopeless/helpless.  Her appetite is good.  She denies SI or HI.  Outpatient Medications Prior to Visit  Medication Sig Dispense Refill  . ALPRAZolam (XANAX) 0.25 MG tablet Take 1 tablet (0.25 mg total) by mouth 2 (two) times daily as needed. 35 tablet 2  . arginine 500 MG tablet Take 3,000 mg by mouth in the morning and at bedtime.     . Ascorbic Acid (VITAMIN C) 1000 MG tablet Take 1,000 mg by mouth daily.    Marland Kitchen aspirin EC 81 MG tablet Take 81 mg by mouth daily.    . Cholecalciferol (VITAMIN D) 50 MCG (2000 UT) tablet Take 2,000 Units by mouth daily.    . clobetasol ointment (TEMOVATE) 4.19 % Apply 1 application topically 2 (two) times daily. 30 g 2  . co-enzyme Q-10 30 MG capsule Take 100 mg by mouth daily.    Marland Kitchen conjugated estrogens (PREMARIN) vaginal cream Place 1 Applicatorful vaginally 2 (two) times a week.     . famotidine (PEPCID) 40 MG tablet Take 1 tablet (40 mg total) by mouth daily. 90 tablet 1  . fish oil-omega-3 fatty acids 1000 MG capsule Take 3 g  by mouth daily.      Marland Kitchen nystatin-triamcinolone ointment (MYCOLOG) Apply 1 application topically as needed (yeast infection).    . rosuvastatin (CRESTOR) 20 MG tablet Take 1 tablet (20 mg total) by mouth daily. 90 tablet 3  . S-Adenosylmethionine (SAM-E) 400 MG TABS Take 400 mg by mouth 2 (two) times daily.    Marland Kitchen telmisartan (MICARDIS) 80 MG tablet Take 1 tablet (80 mg total) by mouth daily. Pt needs to make appt with provider to receive more refills - 1st attempt 90 tablet 0  . ivabradine (CORLANOR) 5 MG TABS tablet Take 2 tablets by mouth 2 hours prior to the cardiac ct 2 tablet 0   No facility-administered medications prior to visit.    ROS Review of Systems  Constitutional: Positive for fatigue and unexpected weight change. Negative for appetite change, chills and diaphoresis.  Respiratory: Negative for cough, chest tightness and shortness of breath.   Cardiovascular: Negative for chest pain, palpitations and leg swelling.  Gastrointestinal: Negative for abdominal pain, diarrhea and nausea.  Musculoskeletal: Negative.  Negative for arthralgias and myalgias.  Skin: Negative.   Hematological: Negative for adenopathy. Does not bruise/bleed easily.  Psychiatric/Behavioral: Positive for dysphoric mood and sleep disturbance. Negative for agitation, behavioral problems, confusion, decreased concentration, self-injury and suicidal ideas. The patient is nervous/anxious. The patient is not hyperactive.  Objective:  BP (!) 138/76 (BP Location: Left Arm, Patient Position: Sitting, Cuff Size: Large)   Pulse 93   Temp 98.6 F (37 C) (Oral)   Resp 16   Ht 5' 4" (1.626 m)   Wt 174 lb (78.9 kg)   SpO2 96%   BMI 29.87 kg/m   BP Readings from Last 3 Encounters:  04/01/20 (!) 138/76  03/25/20 (!) 128/59  02/26/20 130/72    Wt Readings from Last 3 Encounters:  04/01/20 174 lb (78.9 kg)  02/26/20 176 lb (79.8 kg)  02/06/20 174 lb (78.9 kg)    Physical Exam Vitals reviewed.    Constitutional:      Appearance: Normal appearance.  HENT:     Mouth/Throat:     Mouth: Mucous membranes are moist.  Eyes:     General: No scleral icterus.    Conjunctiva/sclera: Conjunctivae normal.  Cardiovascular:     Rate and Rhythm: Normal rate and regular rhythm.     Heart sounds: No murmur heard.   Pulmonary:     Effort: Pulmonary effort is normal.     Breath sounds: No stridor. No wheezing, rhonchi or rales.  Abdominal:     General: Abdomen is protuberant. Bowel sounds are normal. There is no distension.     Palpations: Abdomen is soft. There is no hepatomegaly or mass.  Musculoskeletal:        General: Normal range of motion.     Cervical back: Neck supple.     Right lower leg: No edema.     Left lower leg: No edema.  Skin:    General: Skin is warm and dry.     Coloration: Skin is not pale.  Neurological:     General: No focal deficit present.     Mental Status: She is alert.  Psychiatric:        Attention and Perception: Attention normal. She is attentive.        Mood and Affect: Mood is anxious and depressed. Affect is flat. Affect is not blunt, angry or inappropriate.        Speech: Speech normal. Speech is not delayed or tangential.        Behavior: Behavior normal. Behavior is not agitated, slowed, aggressive, withdrawn or hyperactive.        Thought Content: Thought content normal. Thought content is not paranoid or delusional. Thought content does not include homicidal or suicidal ideation. Thought content does not include homicidal plan.        Cognition and Memory: Cognition normal.        Judgment: Judgment normal.     Lab Results  Component Value Date   WBC 5.3 02/06/2020   HGB 13.5 02/06/2020   HCT 39.8 02/06/2020   PLT 234.0 02/06/2020   GLUCOSE 97 03/22/2020   CHOL 154 02/06/2020   TRIG 80.0 02/06/2020   HDL 41.70 02/06/2020   LDLDIRECT 92.0 12/15/2016   LDLCALC 96 02/06/2020   ALT 16 02/06/2020   AST 14 02/06/2020   NA 144 03/22/2020    K 4.3 03/22/2020   CL 110 (H) 03/22/2020   CREATININE 0.79 03/22/2020   BUN 18 03/22/2020   CO2 21 03/22/2020   TSH 1.80 02/06/2020   HGBA1C 5.8 02/06/2020    CT CORONARY MORPH W/CTA COR W/SCORE W/CA W/CM &/OR WO/CM  Addendum Date: 03/26/2020   ADDENDUM REPORT: 03/26/2020 14:24 CLINICAL DATA:  69 year old female with h/o hypertension, hyperlipidemia and exertional dyspnea. EXAM: Cardiac/Coronary  CTA TECHNIQUE: The patient was  scanned on a [employer]. FINDINGS: A 100 kV prospective scan was triggered in the descending thoracic aorta at 111 HU's. Axial non-contrast 3 mm slices were carried out through the heart. The data set was analyzed on a dedicated work station and scored using the [place]. Gantry rotation speed was 250 msecs and collimation was .6 mm. 10 mg of PO Ivabradine and 0.8 mg of sl NTG was given. The 3D data set was reconstructed in 5% intervals of the 67-82 % of the R-R cycle. Diastolic phases were analyzed on a dedicated work station using MPR, MIP and VRT modes. The patient received 80 cc of contrast. Aorta: Normal size. Mild diffuse atherosclerotic plaque and calcifications. No dissection. Aortic Valve:  Trileaflet.  No calcifications. Coronary Arteries:  Normal coronary origin.  Right dominance. RCA is a large dominant artery that gives rise to PDA and PLA. There is minimal calcified plaque in the ostial portion with stenosis 0-25%, mid and distal RCA have no significant plaque. Left main is a large artery that gives rise to LAD and LCX arteries. Left main has no plaque. LAD is a medium caliber vessel that gives rise to one diagonal artery. There is minimal calcified plaque in the proximal portion with stenosis 0-25%. LCX is a non-dominant artery that gives rise to one large OM1 branch. There is no significant plaque. Other findings: Normal pulmonary vein drainage into the left atrium. Normal left atrial appendage without a thrombus. Normal size of the pulmonary  artery. IMPRESSION: 1. Coronary calcium score of 6.2. This was 1 percentile for age and sex matched control. 2. Normal coronary origin with right dominance. 3. CAD-RADS 1. Minimal non-obstructive CAD (0-24%). Consider non-atherosclerotic causes of chest pain. Consider preventive therapy and risk factor modification. Electronically Signed   By: Ena Dawley   On: 03/26/2020 14:24   Result Date: 03/26/2020 EXAM: OVER-READ INTERPRETATION  CT CHEST The following report is an over-read performed by radiologist Dr. Vinnie Langton of [facility], [place] on 03/25/2020. This over-read does not include interpretation of cardiac or coronary anatomy or pathology. The coronary calcium score/coronary CTA interpretation by the cardiologist is attached. COMPARISON:  None. FINDINGS: Aortic atherosclerosis. Within the visualized portions of the thorax there are no suspicious appearing pulmonary nodules or masses, there is no acute consolidative airspace disease, no pleural effusions, no pneumothorax and no lymphadenopathy. Visualized portions of the upper abdomen are unremarkable. There are no aggressive appearing lytic or blastic lesions noted in the visualized portions of the skeleton. IMPRESSION: 1.  Aortic Atherosclerosis (ICD10-I70.0). Electronically Signed: By: Vinnie Langton M.D. On: 03/25/2020 10:29    Assessment & Plan:   Hind was seen today for depression.  Diagnoses and all orders for this visit:  Moderate episode of recurrent major depressive disorder (Victor)- She agrees to take Viibryd.  Will gradually increase her dose over the next month. -     Vilazodone HCl (VIIBRYD STARTER PACK) 10 & 20 MG KIT; Take 1 tablet by mouth daily.  Essential hypertension- Her BP is well controlled   I have discontinued Kodee L. Risden's ivabradine. I am also having her start on [employer]. Additionally, I am having her maintain her fish oil-omega-3 fatty acids, conjugated estrogens, ALPRAZolam, aspirin  EC, arginine, co-enzyme Q-10, vitamin C, SAM-e, telmisartan, clobetasol ointment, Vitamin D, nystatin-triamcinolone ointment, famotidine, and rosuvastatin.  Meds ordered this encounter  Medications  . Vilazodone HCl (VIIBRYD STARTER PACK) 10 & 20 MG KIT    Sig: Take 1 tablet by  mouth daily.    Dispense:  1 kit    Refill:  0     Follow-up: No follow-ups on file.  Scarlette Calico, MD

## 2020-04-02 ENCOUNTER — Encounter: Payer: Self-pay | Admitting: Internal Medicine

## 2020-04-02 NOTE — Patient Instructions (Signed)
Major Depressive Disorder, Adult Major depressive disorder (MDD) is a mental health condition. It may also be called clinical depression or unipolar depression. MDD usually causes feelings of sadness, hopelessness, or helplessness. MDD can also cause physical symptoms. It can interfere with work, school, relationships, and other everyday activities. MDD may be mild, moderate, or severe. It may occur once (single episode major depressive disorder) or it may occur multiple times (recurrent major depressive disorder). What are the causes? The exact cause of this condition is not known. MDD is most likely caused by a combination of things, which may include:  Genetic factors. These are traits that are passed along from parent to child.  Individual factors. Your personality, your behavior, and the way you handle your thoughts and feelings may contribute to MDD. This includes personality traits and behaviors learned from others.  Physical factors, such as: ? Differences in the part of your brain that controls emotion. This part of your brain may be different than it is in people who do not have MDD. ? Long-term (chronic) medical or psychiatric illnesses.  Social factors. Traumatic experiences or major life changes may play a role in the development of MDD. What increases the risk? This condition is more likely to develop in women. The following factors may also make you more likely to develop MDD:  A family history of depression.  Troubled family relationships.  Abnormally low levels of certain brain chemicals.  Traumatic events in childhood, especially abuse or the loss of a parent.  Being under a lot of stress, or long-term stress, especially from upsetting life experiences or losses.  A history of: ? Chronic physical illness. ? Other mental health disorders. ? Substance abuse.  Poor living conditions.  Experiencing social exclusion or discrimination on a regular basis. What are the  signs or symptoms? The main symptoms of MDD typically include:  Constant depressed or irritable mood.  Loss of interest in things and activities. MDD symptoms may also include:  Sleeping or eating too much or too little.  Unexplained weight change.  Fatigue or low energy.  Feelings of worthlessness or guilt.  Difficulty thinking clearly or making decisions.  Thoughts of suicide or of harming others.  Physical agitation or weakness.  Isolation. Severe cases of MDD may also occur with other symptoms, such as:  Delusions or hallucinations, in which you imagine things that are not real (psychotic depression).  Low-level depression that lasts at least a year (chronic depression or persistent depressive disorder).  Extreme sadness and hopelessness (melancholic depression).  Trouble speaking and moving (catatonic depression). How is this diagnosed? This condition may be diagnosed based on:  Your symptoms.  Your medical history, including your mental health history. This may involve tests to evaluate your mental health. You may be asked questions about your lifestyle, including any drug and alcohol use, and how long you have had symptoms of MDD.  A physical exam.  Blood tests to rule out other conditions. You must have a depressed mood and at least four other MDD symptoms most of the day, nearly every day in the same 2-week timeframe before your health care provider can confirm a diagnosis of MDD. How is this treated? This condition is usually treated by mental health professionals, such as psychologists, psychiatrists, and clinical social workers. You may need more than one type of treatment. Treatment may include:  Psychotherapy. This is also called talk therapy or counseling. Types of psychotherapy include: ? Cognitive behavioral therapy (CBT). This type of therapy   teaches you to recognize unhealthy feelings, thoughts, and behaviors, and replace them with positive thoughts  and actions. ? Interpersonal therapy (IPT). This helps you to improve the way you relate to and communicate with others. ? Family therapy. This treatment includes members of your family.  Medicine to treat anxiety and depression, or to help you control certain emotions and behaviors.  Lifestyle changes, such as: ? Limiting alcohol and drug use. ? Exercising regularly. ? Getting plenty of sleep. ? Making healthy eating choices. ? Spending more time outdoors.  Treatments involving stimulation of the brain can be used in situations with extremely severe symptoms, or when medicine or other therapies do not work over time. These treatments include electroconvulsive therapy, transcranial magnetic stimulation, and vagal nerve stimulation. Follow these instructions at home: Activity  Return to your normal activities as told by your health care provider.  Exercise regularly and spend time outdoors as told by your health care provider. General instructions  Take over-the-counter and prescription medicines only as told by your health care provider.  Do not drink alcohol. If you drink alcohol, limit your alcohol intake to no more than 1 drink a day for nonpregnant women and 2 drinks a day for men. One drink equals 12 oz of beer, 5 oz of wine, or 1 oz of hard liquor. Alcohol can affect any antidepressant medicines you are taking. Talk to your health care provider about your alcohol use.  Eat a healthy diet and get plenty of sleep.  Find activities that you enjoy doing, and make time to do them.  Consider joining a support group. Your health care provider may be able to recommend a support group.  Keep all follow-up visits as told by your health care provider. This is important. Where to find more information National Alliance on Mental Illness  www.nami.org [place] National Institute of Mental Health  www.nimh.nih.gov National Suicide Prevention Lifeline  1-800-273-TALK (8255). This is  free, 24-hour help. Contact a health care provider if:  Your symptoms get worse.  You develop new symptoms. Get help right away if:  You self-harm.  You have serious thoughts about hurting yourself or others.  You see, hear, taste, smell, or feel things that are not present (hallucinate). This information is not intended to replace advice given to you by your health care provider. Make sure you discuss any questions you have with your health care provider. Document Revised: 08/06/2017 Document Reviewed: 03/04/2016 Elsevier Patient Education  2020 [employer]  

## 2020-04-10 ENCOUNTER — Ambulatory Visit: Payer: Medicare PPO | Admitting: Physician Assistant

## 2020-04-11 ENCOUNTER — Encounter: Payer: Self-pay | Admitting: Internal Medicine

## 2020-04-16 DIAGNOSIS — G4733 Obstructive sleep apnea (adult) (pediatric): Secondary | ICD-10-CM | POA: Diagnosis not present

## 2020-04-16 DIAGNOSIS — L723 Sebaceous cyst: Secondary | ICD-10-CM | POA: Diagnosis not present

## 2020-04-24 ENCOUNTER — Ambulatory Visit (INDEPENDENT_AMBULATORY_CARE_PROVIDER_SITE_OTHER): Payer: Medicare PPO | Admitting: Psychiatry

## 2020-04-24 ENCOUNTER — Other Ambulatory Visit: Payer: Self-pay

## 2020-04-24 DIAGNOSIS — F411 Generalized anxiety disorder: Secondary | ICD-10-CM

## 2020-04-24 NOTE — Progress Notes (Signed)
[facility] Counsel[facility] Initial Adult Exam  Name: Kayla Khan Date: 04/24/2020 MRN: [id number] DOB: 1951-08-27 PCP: Janith Lima, MD  Time spent: 60 minutes   4:00pm to 5:00pm  Guardian/Payee:  patient  Paperwork requested:  No   Reason for Visit /Presenting Problem: anxiety "generalized" (PCP gave me Viibryd but it made me sick), anxiety started during pandemic and happens each day but usually wears off later in day or evening, some difficulties with past history of growing up with alcoholism in family (grandfather, uncle), some depression  Mental Status Exam:   Appearance:   Casual     Behavior:  Appropriate, Sharing and Motivated  Motor:  Normal  Speech/Language:   Clear and Coherent  Affect:  anxious  Mood:  anxious  Thought process:  goal directed  Thought content:    WNL, Obsessions, Rumination and admits she obsesses and ruminates sometimes  Sensory/Perceptual disturbances:    WNL  Orientation:  oriented to person, place, time/date, situation, day of week, month of year and year  Attention:  Good  Concentration:  Good  Memory:  WNL  Fund of knowledge:   Good  Insight:    Good  Judgment:   Good  Impulse Control:  Good   Reported Symptoms:  See above symptoms  Risk Assessment: Danger to Self:  No Self-injurious Behavior: No Danger to Others: No Duty to Warn:no Physical Aggression / Violence:No  Access to Firearms a concern: No  Gang Involvement:No  Patient / guardian was educated about steps to take if suicide or homicide risk level increases between visits: Patient denies any thoughts to harm self or others. While future psychiatric events cannot be accurately predicted, the patient does not currently require acute inpatient psychiatric care and does not currently meet [facility] involuntary commitment criteria.  Substance Abuse History: Current substance abuse: No     Past Psychiatric History:   Previous psychological history is significant for  depression Outpatient Providers: Jackelyn Hoehn and others History of Psych Hospitalization: No  Psychological Testing: n/a   Abuse History: Victim of No.,  Report needed: No. Victim of Neglect:No. Perpetrator of n/a  Witness / Exposure to Domestic Violence: No   Protective Services Involvement: No  Witness to [employer] Violence:  No   Family History:  Reviewed with patient and she confirm all info below Except states there is no family history of diabetes. Family History  Problem Relation Age of Onset  . Cancer Mother   . Hypertension Father   . Aneurysm Father 63       cerebral hemorrhage  . Diabetes Other   . Heart disease Other   . Cancer Other   . Arthritis Other   . Hypertension Other   . Aneurysm Other   . Heart attack Maternal Grandfather   . Stroke Paternal Grandfather   . Stroke Maternal Grandmother   . Cancer Brother     Living situation: the patient lives alone, with her cat  Sexual Orientation:  Straight  Relationship Status: divorced  Name of spouse / other:n/a             If a parent, number of children / ages: 2 adult children, 1 female and 1 female (ages 25 and 2)  [place]; friends  Museum/gallery curator Stress:  No   Income/Employment/Disability: Employment  Armed forces logistics/support/administrative officer: No   Educational History: Education: Scientist, product/process development:   [employer]  Any cultural differences that may affect / interfere with treatment:  not applicable   Recreation/Hobbies:  reading and painting  Stressors:Other: concerned about adult son's drinking  Strengths:  Able to Communicate Effectively  Barriers:  "my expectation is to get better"  Legal History: Pending legal issue / charges: The patient has no significant history of legal issues. History of legal issue / charges: n/a   Medical History/Surgical History: Reviewed with patient and she confirms info below. Past Medical History:  Diagnosis Date  . Allergic conjunctivitis   .  Anemia, iron deficiency   . Angina pectoris   . Arthritis   . Asthma   . 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 Depression with anxiety   . Diverticulosis of colon   . DJD (degenerative joint disease)   . Ejection fraction    70%, catheter 2004 / normal LV function nuclear, 2008  . Essential hypertension   . Fatigue   . GERD (gastroesophageal reflux disease)   . HLA B27 positive   . HLD (hyperlipidemia)   . Palpitations   . Pre-diabetes   . Sleep apnea   . Thyroid nodule    The patient had a thyroid nodule that was surgically removed in the past.  She has one half of her thyroid and does not take replacement of Taxol and he was benign    Past Surgical History:  Procedure Laterality Date  . APPENDECTOMY    . BREAST BIOPSY    . CESAREAN SECTION     x2  . GANGLION CYST EXCISION    . THYROIDECTOMY, PARTIAL     left  . TUBAL LIGATION    . VESICOVAGINAL FISTULA CLOSURE W/ TAH      Medications: Current Outpatient Medications  Medication Sig Dispense Refill  . ALPRAZolam (XANAX) 0.25 MG tablet Take 1 tablet (0.25 mg total) by mouth 2 (two) times daily as needed. 35 tablet 2  . arginine 500 MG tablet Take 3,000 mg by mouth in the morning and at bedtime.     . Ascorbic Acid (VITAMIN C) 1000 MG tablet Take 1,000 mg by mouth daily.    Marland Kitchen aspirin EC 81 MG tablet Take 81 mg by mouth daily.    . Cholecalciferol (VITAMIN D) 50 MCG (2000 UT) tablet Take 2,000 Units by mouth daily.    . clobetasol ointment (TEMOVATE) 5.39 % Apply 1 application topically 2 (two) times daily. 30 g 2  . co-enzyme Q-10 30 MG capsule Take 100 mg by mouth daily.    Marland Kitchen conjugated estrogens (PREMARIN) vaginal cream Place 1 Applicatorful vaginally 2 (two) times a week.     . famotidine (PEPCID) 40 MG tablet Take 1 tablet (40 mg total) by mouth daily. 90 tablet 1  . fish oil-omega-3 fatty acids 1000 MG capsule Take 3 g by  mouth daily.      Marland Kitchen nystatin-triamcinolone ointment (MYCOLOG) Apply 1 application topically as needed (yeast infection).    . rosuvastatin (CRESTOR) 20 MG tablet Take 1 tablet (20 mg total) by mouth daily. 90 tablet 3  . S-Adenosylmethionine (SAM-E) 400 MG TABS Take 400 mg by mouth 2 (two) times daily.    Marland Kitchen telmisartan (MICARDIS) 80 MG tablet Take 1 tablet (80 mg total) by mouth daily. Pt needs to make appt with provider to receive more refills - 1st attempt 90 tablet 0  . Vilazodone HCl (VIIBRYD STARTER PACK) 10 & 20 MG KIT Take 1 tablet by mouth daily. 1 kit 0   No current facility-administered medications for  this visit.    Allergies  Allergen Reactions  . Amoxicillin-Pot Clavulanate   . Beta Adrenergic Blockers     Makes chest pain worse  . Latex   . Meperidine Hcl   . Hctz [Hydrochlorothiazide] Nausea And Vomiting    Diagnoses:     ICD-10-CM   1. Generalized anxiety disorder  F41.1      Subjective: This is patient's first session with therapist. She is a 69 yr old, divorced female but family remains close. 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 Has 2 adult kids and is close to both of them. States she is an introvert and has a few friends.  Presents today because of ongoing difficulty with anxiety.  Reports that her anxiety is "generalized" and that her primary care physician prescribed Viibryd for her but it makes her sick so she has stopped taking it.  Motivated for change but feels it will be difficult.  Anxiety is main symptom, starting during pandemic last year and is occurring daily.  Sometimes will wear off in the late afternoon or evening.  Feel her anxiety limits her "in venturing out much as I stay in my safe place at home."  Also reports history of strong alcoholism with grandfather and an uncle but she has been in therapy previously and feels that she has resolved most of those issues.  She does not struggle with  alcoholism herself.  Sees herself as being really honest and "I'm pretty good at analyzing things". Interested in [place] and meditation.    Plan of Care:  Patient not signing treatment plan on computer screen due to [place].  Treatment Goals: Goals remain on the treatment plan as patient works with strategies to achieve her goals.  Progress will be noted each session and documented in the "progress "section of treatment plan.  Long term goal: Reduce overall level, frequency, and intensity of the anxiety so that daily functioning is not impaired.  Short term goal: Verbalize an understanding of the role that anxious/fearful thinking plays in creating fears, excessive worry, and persistent anxiety symptoms.  Strategy: Increase her self-confidence and coping with irrational fears. Reports a decrease daily level of anxiety due to using her strategies including positive self talk.  Progress: At her first meeting with therapist today, we completed this initial evaluation and worked on formulating her treatment goal plan based on her symptoms reported.  She does have some motivation but also a lot of self-doubt and lack of confidence but seems very willing to work on these.  Will be recording progress or lack of progress on goals each session and discussing this with patient. Goal review with patient.  Next appt within 2-3 weeks.   Shanon Ace, LCSW

## 2020-04-26 ENCOUNTER — Other Ambulatory Visit: Payer: Self-pay | Admitting: Physician Assistant

## 2020-04-27 DIAGNOSIS — Z20822 Contact with and (suspected) exposure to covid-19: Secondary | ICD-10-CM | POA: Diagnosis not present

## 2020-05-07 DIAGNOSIS — G4733 Obstructive sleep apnea (adult) (pediatric): Secondary | ICD-10-CM | POA: Diagnosis not present

## 2020-05-07 DIAGNOSIS — Z23 Encounter for immunization: Secondary | ICD-10-CM | POA: Diagnosis not present

## 2020-05-09 ENCOUNTER — Other Ambulatory Visit: Payer: Self-pay

## 2020-05-09 ENCOUNTER — Ambulatory Visit (INDEPENDENT_AMBULATORY_CARE_PROVIDER_SITE_OTHER): Payer: Medicare PPO | Admitting: Psychiatry

## 2020-05-09 DIAGNOSIS — F411 Generalized anxiety disorder: Secondary | ICD-10-CM | POA: Diagnosis not present

## 2020-05-09 NOTE — Progress Notes (Signed)
      [facility] Counselor/Therapist Progress Note  Patient ID: Kayla Khan, MRN: [id number],    Date: 05/09/2020  Time Spent: 60 minutes  4:00pm to 5:00pm  Treatment Type: Individual Therapy  Reported Symptoms: anxiety, stress (but some less)  Mental Status Exam:  Appearance:   Well Groomed     Behavior:  Appropriate, Sharing and Motivated  Motor:  Normal  Speech/Language:   Clear and Coherent  Affect:  anxious  Mood:  anxious  Thought process:  normal  Thought content:    WNL  Sensory/Perceptual disturbances:    WNL  Orientation:  oriented to person, place, time/date, situation, day of week, month of year and year  Attention:  Good  Concentration:  Fair  Memory:  [place] of knowledge:   Good  Insight:    Good and Fair  Judgment:   Good  Impulse Control:  Good   Risk Assessment: Danger to Self:  No Self-injurious Behavior: No Danger to Others: No Duty to Warn:no Physical Aggression / Violence:No  Access to Firearms a concern: No  Gang Involvement:No   Subjective:  Patient today reporting anxiety and feels more motivated and encourage.  I feel more optimistic in managing my anxiety.  Interventions: Cognitive Behavioral Therapy, Solution-Oriented/Positive Psychology and Ego-Supportive  Diagnosis:   ICD-10-CM   1. Generalized anxiety disorder  F41.1      Plan of Care:  Patient not signing treatment plan on computer screen due to COVID.  Treatment Goals: Goals remain on the treatment plan as patient works with strategies to achieve her goals.  Progress will be noted each session and documented in the "progress "section of treatment plan.  Long term goal: Reduce overall level, frequency, and intensity of the anxiety so that daily functioning is not impaired.  Short term goal: Verbalize an understanding of the role that anxious/fearful thinking plays in creating fears, excessive worry, and persistent anxiety symptoms.  Strategy: Increase her  self-confidence and coping with irrational fears. Reports a decrease daily level of anxiety due to using her strategies including positive self talk.  Progress: Patient today reports anxiety and feeling some stressed but am doing some better. Often wakes up anxious and finds it difficult to resolve her anxiety. Worked with patient in session on some strategies including mindfulness, calming self-talk, slow deliberate deep breathing exercises to help alleviate anxiety.  She is to continue using these strategies between sessions and will report effectiveness or lack of at next session. Expresses some optimism and increased motivation.  Goal review and progress noted with patient.  Next appt within 2-3 weeks.   Shanon Ace, LCSW

## 2020-05-10 ENCOUNTER — Ambulatory Visit: Payer: Medicare PPO | Admitting: Psychiatry

## 2020-05-10 DIAGNOSIS — E785 Hyperlipidemia, unspecified: Secondary | ICD-10-CM

## 2020-05-10 DIAGNOSIS — R5383 Other fatigue: Secondary | ICD-10-CM

## 2020-05-10 DIAGNOSIS — Z79899 Other long term (current) drug therapy: Secondary | ICD-10-CM

## 2020-05-14 ENCOUNTER — Other Ambulatory Visit: Payer: Medicare PPO

## 2020-05-15 ENCOUNTER — Ambulatory Visit: Payer: Medicare PPO | Admitting: Internal Medicine

## 2020-05-15 ENCOUNTER — Encounter: Payer: Self-pay | Admitting: Internal Medicine

## 2020-05-15 ENCOUNTER — Other Ambulatory Visit: Payer: Self-pay

## 2020-05-15 VITALS — BP 156/84 | HR 95 | Temp 98.2°F | Resp 16 | Ht 64.0 in | Wt 173.0 lb

## 2020-05-15 DIAGNOSIS — I700 Atherosclerosis of aorta: Secondary | ICD-10-CM | POA: Diagnosis not present

## 2020-05-15 DIAGNOSIS — R002 Palpitations: Secondary | ICD-10-CM

## 2020-05-15 DIAGNOSIS — I10 Essential (primary) hypertension: Secondary | ICD-10-CM | POA: Diagnosis not present

## 2020-05-15 DIAGNOSIS — F5104 Psychophysiologic insomnia: Secondary | ICD-10-CM | POA: Diagnosis not present

## 2020-05-15 MED ORDER — BELSOMRA 15 MG PO TABS: 1.0000 | ORAL_TABLET | Freq: Every evening | ORAL | 2 refills | Status: DC | PRN

## 2020-05-15 MED ORDER — AMLODIPINE BESYLATE 5 MG PO TABS: 5.0000 mg | ORAL_TABLET | Freq: Every day | ORAL | 1 refills | Status: DC

## 2020-05-15 NOTE — Progress Notes (Signed)
Subjective:  Patient ID: Kayla Khan, female    DOB: 1950/09/23  Age: 69 y.o. MRN: [id number]  CC: Hypertension  This visit occurred during the SARS-CoV-2 public health emergency.  Safety protocols were in place, including screening questions prior to the visit, additional usage of staff PPE, and extensive cleaning of exam room while observing appropriate contact time as indicated for disinfecting solutions.    HPI Kayla Khan presents for f/up - She started taking a statin about 2 months ago and now for 3 weeks she complains of mild, intermittent headache with diffuse weakness, fatigue, feeling like her heart is beating hard, and insomnia with frequent awakenings.  She denies nausea, vomiting, paresthesias, dizziness, lightheadedness, near syncope, myalgias, or arthralgias.  She recently stopped taking Crestor and is started feeling better.  Outpatient Medications Prior to Visit  Medication Sig Dispense Refill  . ALPRAZolam (XANAX) 0.25 MG tablet Take 1 tablet (0.25 mg total) by mouth 2 (two) times daily as needed. 35 tablet 2  . arginine 500 MG tablet Take 3,000 mg by mouth in the morning and at bedtime.     . Ascorbic Acid (VITAMIN C) 1000 MG tablet Take 1,000 mg by mouth daily.    Marland Kitchen aspirin EC 81 MG tablet Take 81 mg by mouth daily.    . Cholecalciferol (VITAMIN D) 50 MCG (2000 UT) tablet Take 2,000 Units by mouth daily.    . clobetasol ointment (TEMOVATE) 2.42 % Apply 1 application topically 2 (two) times daily. 30 g 2  . co-enzyme Q-10 30 MG capsule Take 100 mg by mouth daily.    Marland Kitchen conjugated estrogens (PREMARIN) vaginal cream Place 1 Applicatorful vaginally 2 (two) times a week.     . famotidine (PEPCID) 40 MG tablet Take 1 tablet (40 mg total) by mouth daily. 90 tablet 1  . fish oil-omega-3 fatty acids 1000 MG capsule Take 3 g by mouth daily.      Marland Kitchen nystatin-triamcinolone ointment (MYCOLOG) Apply 1 application topically as needed (yeast infection).    . rosuvastatin  (CRESTOR) 20 MG tablet Take 1 tablet (20 mg total) by mouth daily. 90 tablet 3  . S-Adenosylmethionine (SAM-E) 400 MG TABS Take 400 mg by mouth 2 (two) times daily.    Marland Kitchen telmisartan (MICARDIS) 80 MG tablet Take 1 tablet (80 mg total) by mouth daily. 90 tablet 3  . Vilazodone HCl (VIIBRYD STARTER PACK) 10 & 20 MG KIT Take 1 tablet by mouth daily. 1 kit 0   No facility-administered medications prior to visit.    ROS Review of Systems  Constitutional: Positive for fatigue. Negative for chills, diaphoresis, fever and unexpected weight change.  HENT: Negative.   Eyes: Negative for visual disturbance.  Respiratory: Negative for cough, chest tightness, shortness of breath and wheezing.   Cardiovascular: Positive for palpitations. Negative for chest pain and leg swelling.  Endocrine: Negative.   Genitourinary: Negative.  Negative for decreased urine volume, difficulty urinating, dysuria and hematuria.  Musculoskeletal: Negative for arthralgias and myalgias.  Skin: Negative.  Negative for color change and rash.  Neurological: Positive for weakness and headaches. Negative for dizziness, speech difficulty, light-headedness and numbness.  Hematological: Negative for adenopathy. Does not bruise/bleed easily.  Psychiatric/Behavioral: Positive for dysphoric mood and sleep disturbance. Negative for behavioral problems and decreased concentration. The patient is nervous/anxious.     Objective:  BP (!) 156/84   Pulse 95   Temp 98.2 F (36.8 C) (Oral)   Resp 16   Ht 5' 4" (1.626  m)   Wt 173 lb (78.5 kg)   SpO2 96%   BMI 29.70 kg/m   BP Readings from Last 3 Encounters:  05/15/20 (!) 156/84  04/01/20 (!) 138/76  03/25/20 (!) 128/59    Wt Readings from Last 3 Encounters:  05/15/20 173 lb (78.5 kg)  04/01/20 174 lb (78.9 kg)  02/26/20 176 lb (79.8 kg)    Physical Exam Vitals reviewed.  Constitutional:      Appearance: Normal appearance.  HENT:     Nose: Nose normal.     Mouth/Throat:       Mouth: Mucous membranes are moist.  Eyes:     General: No scleral icterus.    Conjunctiva/sclera: Conjunctivae normal.  Cardiovascular:     Rate and Rhythm: Normal rate and regular rhythm.     Heart sounds: Normal heart sounds, S1 normal and S2 normal. No friction rub. No gallop.      Comments: EKG- NSR, 82 bpm NS ST abn - no change from the prior EKG No LVH or Q waves Pulmonary:     Effort: Pulmonary effort is normal.     Breath sounds: No stridor. No wheezing, rhonchi or rales.  Abdominal:     General: Abdomen is flat.     Palpations: There is no mass.     Tenderness: There is no abdominal tenderness. There is no guarding.  Musculoskeletal:     Cervical back: Neck supple.     Right lower leg: No edema.     Left lower leg: No edema.  Lymphadenopathy:     Cervical: No cervical adenopathy.  Skin:    General: Skin is warm and dry.     Coloration: Skin is not pale.     Findings: No erythema.  Neurological:     General: No focal deficit present.     Mental Status: She is alert and oriented to person, place, and time. Mental status is at baseline.  Psychiatric:        Mood and Affect: Mood normal.        Behavior: Behavior normal.     Lab Results  Component Value Date   WBC 5.2 05/15/2020   HGB 13.0 05/15/2020   HCT 38.6 05/15/2020   PLT 251 05/15/2020   GLUCOSE 99 05/15/2020   CHOL 154 02/06/2020   TRIG 80.0 02/06/2020   HDL 41.70 02/06/2020   LDLDIRECT 92.0 12/15/2016   LDLCALC 96 02/06/2020   ALT 16 02/06/2020   AST 14 02/06/2020   NA 142 05/15/2020   K 4.3 05/15/2020   CL 108 05/15/2020   CREATININE 0.88 05/15/2020   BUN 18 05/15/2020   CO2 28 05/15/2020   TSH 1.26 05/15/2020   HGBA1C 5.8 02/06/2020    CT CORONARY MORPH W/CTA COR W/SCORE W/CA W/CM &/OR WO/CM  Addendum Date: 03/26/2020   ADDENDUM REPORT: 03/26/2020 14:24 CLINICAL DATA:  69 year old female with h/o hypertension, hyperlipidemia and exertional dyspnea. EXAM: Cardiac/Coronary  CTA  TECHNIQUE: The patient was scanned on a [employer]. FINDINGS: A 100 kV prospective scan was triggered in the descending thoracic aorta at 111 HU's. Axial non-contrast 3 mm slices were carried out through the heart. The data set was analyzed on a dedicated work station and scored using the [place]. Gantry rotation speed was 250 msecs and collimation was .6 mm. 10 mg of PO Ivabradine and 0.8 mg of sl NTG was given. The 3D data set was reconstructed in 5% intervals of the 67-82 % of the R-R  cycle. Diastolic phases were analyzed on a dedicated work station using MPR, MIP and VRT modes. The patient received 80 cc of contrast. Aorta: Normal size. Mild diffuse atherosclerotic plaque and calcifications. No dissection. Aortic Valve:  Trileaflet.  No calcifications. Coronary Arteries:  Normal coronary origin.  Right dominance. RCA is a large dominant artery that gives rise to PDA and PLA. There is minimal calcified plaque in the ostial portion with stenosis 0-25%, mid and distal RCA have no significant plaque. Left main is a large artery that gives rise to LAD and LCX arteries. Left main has no plaque. LAD is a medium caliber vessel that gives rise to one diagonal artery. There is minimal calcified plaque in the proximal portion with stenosis 0-25%. LCX is a non-dominant artery that gives rise to one large OM1 branch. There is no significant plaque. Other findings: Normal pulmonary vein drainage into the left atrium. Normal left atrial appendage without a thrombus. Normal size of the pulmonary artery. IMPRESSION: 1. Coronary calcium score of 6.2. This was 68 percentile for age and sex matched control. 2. Normal coronary origin with right dominance. 3. CAD-RADS 1. Minimal non-obstructive CAD (0-24%). Consider non-atherosclerotic causes of chest pain. Consider preventive therapy and risk factor modification. Electronically Signed   By: Ena Dawley   On: 03/26/2020 14:24   Result Date: 03/26/2020 EXAM:  OVER-READ INTERPRETATION  CT CHEST The following report is an over-read performed by radiologist Dr. Vinnie Langton of [facility], [place] on 03/25/2020. This over-read does not include interpretation of cardiac or coronary anatomy or pathology. The coronary calcium score/coronary CTA interpretation by the cardiologist is attached. COMPARISON:  None. FINDINGS: Aortic atherosclerosis. Within the visualized portions of the thorax there are no suspicious appearing pulmonary nodules or masses, there is no acute consolidative airspace disease, no pleural effusions, no pneumothorax and no lymphadenopathy. Visualized portions of the upper abdomen are unremarkable. There are no aggressive appearing lytic or blastic lesions noted in the visualized portions of the skeleton. IMPRESSION: 1.  Aortic Atherosclerosis (ICD10-I70.0). Electronically Signed: By: Vinnie Langton M.D. On: 03/25/2020 10:29    Assessment & Plan:   Khamille was seen today for hypertension.  Diagnoses and all orders for this visit:  Atherosclerosis of aorta ([place])- Will continue to work on risk factor modifications. -     amLODipine (NORVASC) 5 MG tablet; Take 1 tablet (5 mg total) by mouth daily.  Essential hypertension- Her blood pressure is not adequately well controlled.  This may be contributing to her symptoms.  I recommended that she add a CCB to the ARB.  I reassured her that I do not think her symptoms are related to the statin.  She is not sure that she is willing to restart it.  Her labs are negative for secondary causes or endorgan damage. -     amLODipine (NORVASC) 5 MG tablet; Take 1 tablet (5 mg total) by mouth daily. -     CBC with Differential/Platelet; Future -     BASIC METABOLIC PANEL WITH GFR; Future -     TSH; Future -     TSH -     BASIC METABOLIC PANEL WITH GFR -     CBC with Differential/Platelet  Psychophysiological insomnia -     Suvorexant (BELSOMRA) 15 MG TABS; Take 1 tablet by mouth at bedtime as  needed.   I am having Kayla Khan start on amLODipine and Belsomra. I am also having her maintain her fish oil-omega-3 fatty acids, conjugated estrogens, ALPRAZolam,  aspirin EC, arginine, co-enzyme Q-10, vitamin C, SAM-e, clobetasol ointment, Vitamin D, nystatin-triamcinolone ointment, famotidine, rosuvastatin, Viibryd Starter Pack, and telmisartan.  Meds ordered this encounter  Medications  . amLODipine (NORVASC) 5 MG tablet    Sig: Take 1 tablet (5 mg total) by mouth daily.    Dispense:  90 tablet    Refill:  1  . Suvorexant (BELSOMRA) 15 MG TABS    Sig: Take 1 tablet by mouth at bedtime as needed.    Dispense:  30 tablet    Refill:  2     Follow-up: Return in about 3 months (around 08/14/2020).  Scarlette Calico, MD

## 2020-05-15 NOTE — Patient Instructions (Signed)

## 2020-05-16 LAB — BASIC METABOLIC PANEL WITH GFR
BUN: 18 mg/dL (ref 7–25)
CO2: 28 mmol/L (ref 20–32)
Calcium: 10.1 mg/dL (ref 8.6–10.4)
Chloride: 108 mmol/L (ref 98–110)
Creat: 0.88 mg/dL (ref 0.60–0.93)
GFR, Est African American: 77 mL/min/{1.73_m2} (ref >=60)
GFR, Est Non African American: 66 mL/min/{1.73_m2} (ref >=60)
Glucose, Bld: 99 mg/dL (ref 65–99)
Potassium: 4.3 mmol/L (ref 3.5–5.3)
Sodium: 142 mmol/L (ref 135–146)

## 2020-05-16 LAB — TSH: TSH: 1.26 mIU/L (ref 0.40–4.50)

## 2020-05-16 LAB — CBC WITH DIFFERENTIAL/PLATELET
Absolute Monocytes: 343 cells/uL (ref 200–950)
Basophils Absolute: 31 cells/uL (ref 0–200)
Basophils Relative: 0.6 %
Eosinophils Absolute: 62 cells/uL (ref 15–500)
Eosinophils Relative: 1.2 %
HCT: 38.6 % (ref 35.0–45.0)
Hemoglobin: 13 g/dL (ref 11.7–15.5)
Lymphs Abs: 1258 cells/uL (ref 850–3900)
MCH: 31.3 pg (ref 27.0–33.0)
MCHC: 33.7 g/dL (ref 32.0–36.0)
MCV: 93 fL (ref 80.0–100.0)
MPV: 9.6 fL (ref 7.5–12.5)
Monocytes Relative: 6.6 %
Neutro Abs: 3505 cells/uL (ref 1500–7800)
Neutrophils Relative %: 67.4 %
Platelets: 251 10*3/uL (ref 140–400)
RBC: 4.15 10*6/uL (ref 3.80–5.10)
RDW: 12.1 % (ref 11.0–15.0)
Total Lymphocyte: 24.2 %
WBC: 5.2 10*3/uL (ref 3.8–10.8)

## 2020-05-16 NOTE — Addendum Note (Signed)
Addended by: Juliet Rude on: 05/16/2020 08:40 AM   Modules accepted: Orders

## 2020-05-17 DIAGNOSIS — G4733 Obstructive sleep apnea (adult) (pediatric): Secondary | ICD-10-CM | POA: Diagnosis not present

## 2020-05-20 ENCOUNTER — Other Ambulatory Visit: Payer: Medicare PPO

## 2020-05-20 MED ORDER — PRAVASTATIN SODIUM 40 MG PO TABS: 40.0000 mg | ORAL_TABLET | Freq: Every evening | ORAL | 3 refills | Status: DC

## 2020-05-20 NOTE — Telephone Encounter (Signed)
Please let Kayla Khan know we're sorry to hear she's feeling poorly! I appreciate her keeping [place] in the loop. We have a few options here.  When she was on pravastatin 20mg , her LDL was 96. Whenever we see even mild plaque buildup we recommend keeping that goal LDL <70. But there are many different ways to accomplish this.  1) If she wants to go back to pravastatin 20mg  and try to work on diet/exercise this is an option. Lifestyle modification can improve so many of the things we do in medicine, but it also depends on what level of diet/exercise the patient is presently doing and whether they see room for improvement. I personally have seen patients achieve better cholesterol by doing Weight Watchers or [facility] diet/Mediterranean type diet. Not everyone can do this on their own so if she'd like to see the healthy weight/wellness clinic to help with her personal goals, this is an option.  2) Going up on the pravastatin 40 mg daily is an option as well, if she thinks she can tolerate this  -or-  3) if neither of these options sound feasible to her, we can scale back all the way completely on the Crestor down to 5mg  daily and see what we can achieve with that dose   Would cancel our upcoming labs since she had recent CBC, BMET, and TSH with her PCP - agree no need to recheck if we are changing direction with plan. (Remember to cancel the Hemoglobin/hematocrit too since she wound up having a CBC by PCP.)   Would arrange to recheck LFTs/lipids only in about 8 weeks to see what she decides.  Kayla Woodring PA-C

## 2020-05-20 NOTE — Addendum Note (Signed)
Addended by: Gaetano Net on: 05/20/2020 03:18 PM   Modules accepted: Orders

## 2020-05-23 ENCOUNTER — Ambulatory Visit (INDEPENDENT_AMBULATORY_CARE_PROVIDER_SITE_OTHER): Payer: Medicare PPO | Admitting: Psychiatry

## 2020-05-23 ENCOUNTER — Other Ambulatory Visit: Payer: Self-pay

## 2020-05-23 DIAGNOSIS — F411 Generalized anxiety disorder: Secondary | ICD-10-CM

## 2020-05-23 NOTE — Progress Notes (Signed)
[facility] Counselor/Therapist Progress Note  Patient ID: Kayla Khan, MRN: [id number],    Date: 05/23/2020  Time Spent: 60 minutes  2:00pm to 3:00pm  Treatment Type: Individual Therapy  Reported Symptoms: anxiety, physical symptoms due to medication reaction and checked with her doctor on this  Mental Status Exam:  Appearance:   Casual     Behavior:  Appropriate, Sharing and Motivated  Motor:  Normal  Speech/Language:   Clear and Coherent  Affect:  anxious  Mood:  anxious  Thought process:  normal  Thought content:    WNL  Sensory/Perceptual disturbances:    WNL  Orientation:  oriented to person, place, time/date, situation, day of week, month of year and year  Attention:  Good  Concentration:  Fair  Memory:  [place] of knowledge:   Good  Insight:    Fair  Judgment:   Fair  Impulse Control:  Good   Risk Assessment: Danger to Self:  No Self-injurious Behavior: No Danger to Others: No Duty to Warn:no Physical Aggression / Violence:No  Access to Firearms a concern: No  Gang Involvement:No   Subjective: Patient today reports some progress and I'm starting to "tease apart what's bothering me."  Journaling and meditation are helping. Distinguishing the "emotional from the physical" and discussed some examples she experienced.  Interventions: Cognitive Behavioral Therapy and Solution-Oriented/Positive Psychology  Diagnosis:   ICD-10-CM   1. Generalized anxiety disorder  F41.1     Plan of Care: Patient not signing treatment plan on computer screen due to COVID.  Treatment Goals: Goals remain on the treatment plan as patient works with strategies to achieve her goals. Progress will be noted each session and documented in the "progress "section of treatment plan.  Long term goal: Reduce overall level, frequency, and intensity of the anxiety so that daily functioning is not impaired.  Short term goal: Verbalize an understanding of the role that  anxious/fearful thinking plays in creating fears, excessive worry, and persistent anxiety symptoms, and work to interrupt and replace the anxious/fearful thoughts with more reality-based, positive, and empowering thoughts that do not support anxiety nor fearfulness.  Strategy: Increase her self-confidence and coping with irrational fears. Reports a decrease daily level of anxiety due to using her strategies including positive self talk.  Progress: Patient in today reporting anxiety and that "I'm figuring out what's bothering me, which is I want to get a goal accomplished and things getting in my way.Did some journaling and processed it today in session.  Also made a "Positives" list to help her pay more attention to positives versus negatives which leads her to feel anxious. "what  I'd like is more energy and more joy."  Plans to start walking some again when weather gets a bit cooler and be more in contact with supportive people in her life.  Feeling some better and stress has decreased some. Continues to use her breathing exercises and is finding her watercolor lessons very helpful and calming. Still having some times of "waking up anxious/fearful" but is better managing them through the use of her short-term goal in treatment plan above where patient is working to interrupt the anxious/fearful thoughts and replace with more reality based, positive, and empowering thoughts that do not support anxiety nor fearfulness.  She reports today that the anxious/fearful thoughts are decreasing.  Patient is showing progress and we reviewed her strategies of mindfulness, deep breathing exercises, and calming self-talk, as she feels they are all contributing to some  relief for her.  Her energy level seems a little bit better today and she is expressing some increased optimism and hopefulness.  Remains motivated.  Goal review and progress/challenges noted with patient.  Next appt within 2-3 weeks.   Shanon Ace, LCSW

## 2020-05-27 ENCOUNTER — Other Ambulatory Visit: Payer: Medicare PPO

## 2020-06-06 ENCOUNTER — Ambulatory Visit (INDEPENDENT_AMBULATORY_CARE_PROVIDER_SITE_OTHER): Payer: Medicare PPO | Admitting: Psychiatry

## 2020-06-06 ENCOUNTER — Other Ambulatory Visit: Payer: Self-pay

## 2020-06-06 DIAGNOSIS — F411 Generalized anxiety disorder: Secondary | ICD-10-CM | POA: Diagnosis not present

## 2020-06-06 NOTE — Progress Notes (Signed)
[facility] Counselor/Therapist Progress Note  Patient ID: Kayla Khan, MRN: [id number],    Date: 06/06/2020  Time Spent: 60 minutes   3:00pm to 4:00pm  Treatment Type: Individual Therapy  Reported Symptoms:  Anxiety (much better)  Mental Status Exam:  Appearance:   Well Groomed     Behavior:  Appropriate, Sharing and Motivated  Motor:  Normal  Speech/Language:   Clear and Coherent  Affect:  Appropriate  Mood:  anxious "But much better over all"  Thought process:  normal  Thought content:    WNL  Sensory/Perceptual disturbances:    WNL  Orientation:  oriented to person, place, time/date, situation, day of week, month of year and year  Attention:  Good  Concentration:  Good  Memory:  WNL  Fund of knowledge:   Good  Insight:    Good  Judgment:   Good  Impulse Control:  Good   Risk Assessment: Danger to Self:  No Self-injurious Behavior: No Danger to Others: No Duty to Warn:no Physical Aggression / Violence:No  Access to Firearms a concern: No  Gang Involvement:No   Subjective: Patient today reports anxiety is significantly better. Functioning better with a schedule.  Interventions: Solution-Oriented/Positive Psychology and Ego-Supportive  Diagnosis:   ICD-10-CM   1. Generalized anxiety disorder  F41.1      Plan of Care: Patient not signing treatment plan on computer screen due to COVID.  Treatment Goals: Goals remain on the treatment plan as patient works with strategies to achieve her goals. Progress will be noted each session and documented in the "progress "section of treatment plan.  Long term goal: Reduce overall level, frequency, and intensity of the anxiety so that daily functioning is not impaired.  Short term goal: Verbalize an understanding of the role that anxious/fearful thinking plays in creating fears, excessive worry, and persistent anxiety symptoms, and work to interrupt and replace the anxious/fearful thoughts with more  reality-based, positive, and empowering thoughts that do not support anxiety nor fearfulness.  Strategy: Increase her self-confidence and coping with irrational fears. Reports a decrease daily level of anxiety due to using her strategies including positive self talk.  Progress: Patient in today reporting anxiety has significantly lessened. Feeling better physically and emotionally. 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 Getting out more and is/has gotten herself involved in several activities including some gardening, meditation, going to plays locally, meeting friends for dinner.  Also found that getting herself on a schedule has helped a lot.  Using journaling as a tool to express her feelings has been very useful for patient.  She has done a great job and following through on recommended strategies in managing her anxiety.  Developing the habits of looking for positives versus negatives, staying in the present versus the past or future, focusing on things that she can control versus cannot control, staying as active as possible with some walking and involving herself such as painting classes, maintaining friendships with supportive people, is more active in her church activities, using deep breathing exercises to help with intermittent stress or anxiety, interrupting anxious thoughts and replacing them with more realistic and positive/empowering thoughts, and has been making her self- talk more positive.  Patient is not needing to return at this time.  She is encouraged to continue practicing the strategies and habits that have helped her improve.  She will call and reschedule as needed.  Goal review and progress/challenges noted with patient.  Next appt  within 2-3 weeks.   Shanon Ace, LCSW

## 2020-06-16 DIAGNOSIS — G4733 Obstructive sleep apnea (adult) (pediatric): Secondary | ICD-10-CM | POA: Diagnosis not present

## 2020-06-21 ENCOUNTER — Ambulatory Visit: Payer: Medicare PPO | Admitting: Psychiatry

## 2020-07-05 ENCOUNTER — Ambulatory Visit: Payer: Medicare PPO | Admitting: Psychiatry

## 2020-07-16 ENCOUNTER — Other Ambulatory Visit: Payer: Medicare PPO

## 2020-07-17 DIAGNOSIS — G4733 Obstructive sleep apnea (adult) (pediatric): Secondary | ICD-10-CM | POA: Diagnosis not present

## 2020-07-19 ENCOUNTER — Other Ambulatory Visit: Payer: Self-pay

## 2020-07-19 ENCOUNTER — Other Ambulatory Visit: Payer: Medicare PPO | Admitting: *Deleted

## 2020-07-19 DIAGNOSIS — Z79899 Other long term (current) drug therapy: Secondary | ICD-10-CM

## 2020-07-19 DIAGNOSIS — R5383 Other fatigue: Secondary | ICD-10-CM | POA: Diagnosis not present

## 2020-07-19 DIAGNOSIS — E785 Hyperlipidemia, unspecified: Secondary | ICD-10-CM

## 2020-07-19 LAB — HEPATIC FUNCTION PANEL
ALT: 18 IU/L (ref 0–32)
AST: 17 IU/L (ref 0–40)
Albumin: 4.4 g/dL (ref 3.7–4.7)
Alkaline Phosphatase: 74 IU/L (ref 44–121)
Bilirubin Total: 0.5 mg/dL (ref 0.0–1.2)
Bilirubin, Direct: 0.16 mg/dL (ref 0.00–0.40)
Total Protein: 7 g/dL (ref 6.0–8.5)

## 2020-07-19 LAB — LIPID PANEL
Chol/HDL Ratio: 2.7 ratio (ref 0.0–4.4)
Cholesterol, Total: 136 mg/dL (ref 100–199)
HDL: 51 mg/dL (ref >39)
LDL Chol Calc (NIH): 71 mg/dL (ref 0–99)
Triglycerides: 67 mg/dL (ref 0–149)
VLDL Cholesterol Cal: 14 mg/dL (ref 5–40)

## 2020-07-22 ENCOUNTER — Telehealth: Payer: Self-pay | Admitting: *Deleted

## 2020-07-22 MED ORDER — PRAVASTATIN SODIUM 40 MG PO TABS
40.0000 mg | ORAL_TABLET | Freq: Every evening | ORAL | 3 refills | Status: DC
Start: 2020-07-22 — End: 2021-07-16

## 2020-07-22 NOTE — Telephone Encounter (Signed)
-----   Message from Charlie Pitter, [place] sent at 07/19/2020  4:53 PM EST ----- Please let pt know good news. LDL has come down from 96 to 71. Her goal LDL is 70 or less- 71 is an improvement and right on the cusp so I am pleased. I know she had symptoms with higher intensity statin/Crestor so would continue present dose of Pravastatin. As always we also encourage healthy eating/lifestyle measures as little changes can have big effects!

## 2020-07-22 NOTE — Telephone Encounter (Signed)
Call placed to pt to discuss lab results below.  Pt requested refill for Pravastatin.  Sent to [facility].

## 2020-08-08 DIAGNOSIS — G4733 Obstructive sleep apnea (adult) (pediatric): Secondary | ICD-10-CM | POA: Diagnosis not present

## 2020-08-16 DIAGNOSIS — G4733 Obstructive sleep apnea (adult) (pediatric): Secondary | ICD-10-CM | POA: Diagnosis not present

## 2020-08-19 ENCOUNTER — Other Ambulatory Visit: Payer: Self-pay

## 2020-08-19 ENCOUNTER — Encounter: Payer: Self-pay | Admitting: Internal Medicine

## 2020-08-19 ENCOUNTER — Ambulatory Visit (INDEPENDENT_AMBULATORY_CARE_PROVIDER_SITE_OTHER): Payer: Medicare PPO | Admitting: Internal Medicine

## 2020-08-19 VITALS — BP 128/76 | HR 77 | Temp 97.7°F | Ht 64.0 in | Wt 181.0 lb

## 2020-08-19 DIAGNOSIS — M19012 Primary osteoarthritis, left shoulder: Secondary | ICD-10-CM

## 2020-08-19 DIAGNOSIS — I10 Essential (primary) hypertension: Secondary | ICD-10-CM

## 2020-08-19 MED ORDER — TRAMADOL HCL 50 MG PO TABS: 50.0000 mg | ORAL_TABLET | Freq: Two times a day (BID) | ORAL | 1 refills | Status: DC | PRN

## 2020-08-19 MED ORDER — CELECOXIB 50 MG PO CAPS: 50.0000 mg | ORAL_CAPSULE | Freq: Two times a day (BID) | ORAL | 1 refills | Status: DC

## 2020-08-19 NOTE — Progress Notes (Signed)
Subjective:  Patient ID: Kayla Khan, female    DOB: 12/31/1950  Age: 69 y.o. MRN: [id number]  CC: Hypertension and Osteoarthritis  This visit occurred during the SARS-CoV-2 public health emergency.  Safety protocols were in place, including screening questions prior to the visit, additional usage of staff PPE, and extensive cleaning of exam room while observing appropriate contact time as indicated for disinfecting solutions.    HPI Kayla Khan presents for f/up - She complains of chronic left shoulder pain.  The pain interferes with her sleep and daily activities.  She denies any recent trauma or injury.  She is not ready to have surgery for this yet.  She does not take traditional NSAIDs because there is a history of GI bleeding.  She has not gotten much symptom relief with Tylenol.  She tells me her blood pressure has been well controlled.  She is active and denies any recent episodes of chest pain, shortness of breath, palpitations, edema, or fatigue.  Outpatient Medications Prior to Visit  Medication Sig Dispense Refill  . ALPRAZolam (XANAX) 0.25 MG tablet Take 1 tablet (0.25 mg total) by mouth 2 (two) times daily as needed. 35 tablet 2  . amLODipine (NORVASC) 5 MG tablet Take 1 tablet (5 mg total) by mouth daily. 90 tablet 1  . arginine 500 MG tablet Take 3,000 mg by mouth in the morning and at bedtime.     . Ascorbic Acid (VITAMIN C) 1000 MG tablet Take 1,000 mg by mouth daily.    Marland Kitchen aspirin EC 81 MG tablet Take 81 mg by mouth daily.    . Cholecalciferol (VITAMIN D) 50 MCG (2000 UT) tablet Take 2,000 Units by mouth daily.    . clobetasol ointment (TEMOVATE) 8.18 % Apply 1 application topically 2 (two) times daily. 30 g 2  . co-enzyme Q-10 30 MG capsule Take 100 mg by mouth daily.    Marland Kitchen conjugated estrogens (PREMARIN) vaginal cream Place 1 Applicatorful vaginally 2 (two) times a week.     . famotidine (PEPCID) 40 MG tablet Take 1 tablet (40 mg total) by mouth daily. 90 tablet 1   . fish oil-omega-3 fatty acids 1000 MG capsule Take 3 g by mouth daily.    Marland Kitchen nystatin-triamcinolone ointment (MYCOLOG) Apply 1 application topically as needed (yeast infection).    . pravastatin (PRAVACHOL) 40 MG tablet Take 1 tablet (40 mg total) by mouth every evening. 90 tablet 3  . Suvorexant (BELSOMRA) 15 MG TABS Take 1 tablet by mouth at bedtime as needed. 30 tablet 2  . telmisartan (MICARDIS) 80 MG tablet Take 1 tablet (80 mg total) by mouth daily. 90 tablet 3  . S-Adenosylmethionine (SAM-E) 400 MG TABS Take 400 mg by mouth 2 (two) times daily.    . Vilazodone HCl (VIIBRYD STARTER PACK) 10 & 20 MG KIT Take 1 tablet by mouth daily. 1 kit 0   No facility-administered medications prior to visit.    ROS Review of Systems  Constitutional: Positive for unexpected weight change (wt gain). Negative for diaphoresis and fatigue.  HENT: Negative.   Eyes: Negative.   Respiratory: Negative for cough, chest tightness, shortness of breath and wheezing.   Cardiovascular: Negative for chest pain, palpitations and leg swelling.  Gastrointestinal: Negative for abdominal pain, constipation, diarrhea and vomiting.  Endocrine: Negative.   Genitourinary: Negative.  Negative for difficulty urinating.  Musculoskeletal: Positive for arthralgias. Negative for myalgias.  Skin: Negative.   Neurological: Negative.  Negative for dizziness, weakness and headaches.  Hematological: Negative for adenopathy. Does not bruise/bleed easily.  Psychiatric/Behavioral: Negative.     Objective:  BP 128/76   Pulse 77   Temp 97.7 F (36.5 C) (Oral)   Ht _0  (1.626 m)   Wt 181 lb (82.1 kg)   SpO2 98%   BMI 31.07 kg/m   BP Readings from Last 3 Encounters:  08/19/20 128/76  05/15/20 (!) 156/84  04/01/20 (!) 138/76    Wt Readings from Last 3 Encounters:  08/19/20 181 lb (82.1 kg)  05/15/20 173 lb (78.5 kg)  04/01/20 174 lb (78.9 kg)    Physical Exam Vitals reviewed.  HENT:     Nose: Nose normal.      Mouth/Throat:     Mouth: Mucous membranes are moist.  Eyes:     General: No scleral icterus.    Conjunctiva/sclera: Conjunctivae normal.  Cardiovascular:     Rate and Rhythm: Normal rate and regular rhythm.     Heart sounds: No murmur heard.   Pulmonary:     Effort: Pulmonary effort is normal.     Breath sounds: No stridor. No wheezing, rhonchi or rales.  Abdominal:     General: Abdomen is flat. Bowel sounds are normal. There is no distension.     Palpations: Abdomen is soft. There is no hepatomegaly, splenomegaly or mass.     Tenderness: There is no abdominal tenderness.  Musculoskeletal:     Right shoulder: Normal.     Left shoulder: No swelling, deformity or bony tenderness. Decreased range of motion.     Cervical back: Neck supple.     Right lower leg: No edema.     Left lower leg: No edema.  Lymphadenopathy:     Cervical: No cervical adenopathy.  Skin:    General: Skin is warm and dry.  Neurological:     Mental Status: She is alert.     Lab Results  Component Value Date   WBC 5.2 05/15/2020   HGB 13.0 05/15/2020   HCT 38.6 05/15/2020   PLT 251 05/15/2020   GLUCOSE 99 05/15/2020   CHOL 136 07/19/2020   TRIG 67 07/19/2020   HDL 51 07/19/2020   LDLDIRECT 92.0 12/15/2016   LDLCALC 71 07/19/2020   ALT 18 07/19/2020   AST 17 07/19/2020   NA 142 05/15/2020   K 4.3 05/15/2020   CL 108 05/15/2020   CREATININE 0.88 05/15/2020   BUN 18 05/15/2020   CO2 28 05/15/2020   TSH 1.26 05/15/2020   HGBA1C 5.8 02/06/2020    CT CORONARY MORPH W/CTA COR W/SCORE W/CA W/CM &/OR WO/CM  Addendum Date: 03/26/2020   ADDENDUM REPORT: 03/26/2020 14:24 CLINICAL DATA:  69 year old female with h/o hypertension, hyperlipidemia and exertional dyspnea. EXAM: Cardiac/Coronary  CTA TECHNIQUE: The patient was scanned on a [employer]. FINDINGS: A 100 kV prospective scan was triggered in the descending thoracic aorta at 111 HU's. Axial non-contrast 3 mm slices were carried out  through the heart. The data set was analyzed on a dedicated work station and scored using the [place]. Gantry rotation speed was 250 msecs and collimation was .6 mm. 10 mg of PO Ivabradine and 0.8 mg of sl NTG was given. The 3D data set was reconstructed in 5% intervals of the 67-82 % of the R-R cycle. Diastolic phases were analyzed on a dedicated work station using MPR, MIP and VRT modes. The patient received 80 cc of contrast. Aorta: Normal size. Mild diffuse atherosclerotic plaque and calcifications. No dissection. Aortic Valve:  Trileaflet.  No calcifications. Coronary Arteries:  Normal coronary origin.  Right dominance. RCA is a large dominant artery that gives rise to PDA and PLA. There is minimal calcified plaque in the ostial portion with stenosis 0-25%, mid and distal RCA have no significant plaque. Left main is a large artery that gives rise to LAD and LCX arteries. Left main has no plaque. LAD is a medium caliber vessel that gives rise to one diagonal artery. There is minimal calcified plaque in the proximal portion with stenosis 0-25%. LCX is a non-dominant artery that gives rise to one large OM1 branch. There is no significant plaque. Other findings: Normal pulmonary vein drainage into the left atrium. Normal left atrial appendage without a thrombus. Normal size of the pulmonary artery. IMPRESSION: 1. Coronary calcium score of 6.2. This was 42 percentile for age and sex matched control. 2. Normal coronary origin with right dominance. 3. CAD-RADS 1. Minimal non-obstructive CAD (0-24%). Consider non-atherosclerotic causes of chest pain. Consider preventive therapy and risk factor modification. Electronically Signed   By: Ena Dawley   On: 03/26/2020 14:24   Result Date: 03/26/2020 EXAM: OVER-READ INTERPRETATION  CT CHEST The following report is an over-read performed by radiologist Dr. Vinnie Langton of [facility], [place] on 03/25/2020. This over-read does not include interpretation of  cardiac or coronary anatomy or pathology. The coronary calcium score/coronary CTA interpretation by the cardiologist is attached. COMPARISON:  None. FINDINGS: Aortic atherosclerosis. Within the visualized portions of the thorax there are no suspicious appearing pulmonary nodules or masses, there is no acute consolidative airspace disease, no pleural effusions, no pneumothorax and no lymphadenopathy. Visualized portions of the upper abdomen are unremarkable. There are no aggressive appearing lytic or blastic lesions noted in the visualized portions of the skeleton. IMPRESSION: 1.  Aortic Atherosclerosis (ICD10-I70.0). Electronically Signed: By: Vinnie Langton M.D. On: 03/25/2020 10:29    Assessment & Plan:   Kayla Khan was seen today for hypertension and osteoarthritis.  Diagnoses and all orders for this visit:  Primary osteoarthritis of left shoulder- I recommended that she try a Cox 2 inhibitor and tramadol to control the pain. -     celecoxib (CELEBREX) 50 MG capsule; Take 1 capsule (50 mg total) by mouth 2 (two) times daily. -     traMADol (ULTRAM) 50 MG tablet; Take 1 tablet (50 mg total) by mouth every 12 (twelve) hours as needed.  Essential hypertension- Her blood pressure is adequately well controlled.   I have discontinued Kayla Khan's [facility] and Hospital doctor. I am also having her start on celecoxib and traMADol. Additionally, I am having her maintain her fish oil-omega-3 fatty acids, conjugated estrogens, ALPRAZolam, aspirin EC, arginine, co-enzyme Q-10, vitamin C, clobetasol ointment, Vitamin D, nystatin-triamcinolone ointment, famotidine, telmisartan, amLODipine, Belsomra, and pravastatin.  Meds ordered this encounter  Medications  . celecoxib (CELEBREX) 50 MG capsule    Sig: Take 1 capsule (50 mg total) by mouth 2 (two) times daily.    Dispense:  180 capsule    Refill:  1  . traMADol (ULTRAM) 50 MG tablet    Sig: Take 1 tablet (50 mg total) by mouth every 12 (twelve)  hours as needed.    Dispense:  60 tablet    Refill:  1     Follow-up: Return in about 6 months (around 02/17/2021).  Scarlette Calico, MD

## 2020-08-19 NOTE — Patient Instructions (Signed)

## 2020-09-16 DIAGNOSIS — G4733 Obstructive sleep apnea (adult) (pediatric): Secondary | ICD-10-CM | POA: Diagnosis not present

## 2020-09-16 DIAGNOSIS — M67912 Unspecified disorder of synovium and tendon, left shoulder: Secondary | ICD-10-CM | POA: Diagnosis not present

## 2020-10-09 ENCOUNTER — Encounter: Payer: Self-pay | Admitting: Physician Assistant

## 2020-10-09 NOTE — Progress Notes (Signed)
Virtual Visit via Telephone Note   This visit type was conducted due to national recommendations for restrictions regarding the COVID-19 Pandemic (e.g. social distancing) in an effort to limit this patient's exposure and mitigate transmission in our community.  Due to her co-morbid illnesses, this patient is at least at moderate risk for complications without adequate follow up.  This format is felt to be most appropriate for this patient at this time.  The patient did not have access to video technology/had technical difficulties with video requiring transitioning to audio format only (telephone).  All issues noted in this document were discussed and addressed.  No physical exam could be performed with this format.  Please refer to the patient's chart for her  consent to telehealth for [facility].   The patient was identified using 2 identifiers.  Date:  10/11/2020   ID:  Kayla Khan, DOB 10-12-1950, MRN [id number]  Patient Location: Home Provider Location: Office/Clinic  PCP:  Janith Lima, MD  Cardiologist:  Ena Dawley, MD  Electrophysiologist:  None   Evaluation Performed:  Follow-Up Visit  Chief Complaint:  F/u CAD  History of Present Illness:    Kayla Khan is a 70 y.o. 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 She was remotely followed by Dr. Ron Parker and has since been followed by Dr. Meda Coffee. She had a normal nuclear stress test in 2008. The patient felt symptomatically improved after starting L-citruline and L-arginine. At last OV in 02/2020 she was having some left upper arm pain and chest discomfort with exertion so coronary CTA was pursued showing minimal nonobstructive disease  with otherwise no significant findings. Regarding lipids, she's previously had intolerance to Crestor so has been managed on pravastatin.   She is seen back for follow-up today doing great from a cardiac standpoint. No recent issues with CP. She still has some mild dyspnea when beginning exercise like walking but as she gets going it improves. She overall feels she's doing well. Initial BP was elevated on start of visit but this was shortly after she was up moving about. She rechecked it and it was 111/64. It is generally controlled when she checks it otherwise. 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   Labs Independently Reviewed 07/2020 LDL 71, LFTs wnl (previous LDL 96) 05/2020 CBC/BMET/TSH wnl  Past Medical History:  Diagnosis Date  . Allergic conjunctivitis   . Anemia, iron deficiency   . Arthritis   . Asthma   . 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 Depression with anxiety   . Diverticulosis of colon   . DJD (degenerative joint disease)   . Ejection fraction    70%, catheter 2004 / normal LV function nuclear, 2008  . Essential hypertension   . Fatigue   . GERD (gastroesophageal reflux disease)   . HLA B27 positive   . HLD (hyperlipidemia)   . Microvascular angina (HCC)    a. h/o such - cor CT 03/2020 with minimal CAD, consider noncardiac causes of pain.  . Palpitations   . Pre-diabetes   . Sleep apnea   . Thyroid nodule    The patient had a thyroid  nodule that was surgically removed in the past.  She has one half of her thyroid and does not take replacement of Taxol and he was benign   Past Surgical History:  Procedure Laterality Date  . APPENDECTOMY    . BREAST BIOPSY    . CESAREAN SECTION     x2  . GANGLION CYST EXCISION    . THYROIDECTOMY, PARTIAL     left  . TUBAL LIGATION    . VESICOVAGINAL FISTULA CLOSURE W/ TAH       Current Meds  Medication Sig  .  ALPRAZolam (XANAX) 0.25 MG tablet Take 1 tablet (0.25 mg total) by mouth 2 (two) times daily as needed.  Marland Kitchen amLODipine (NORVASC) 5 MG tablet Take 1 tablet (5 mg total) by mouth daily.  . Arginine 1000 MG TABS Take 3,000 mg by mouth in the morning and at bedtime.  . Ascorbic Acid (VITAMIN C) 1000 MG tablet Take 1,000 mg by mouth daily.  Marland Kitchen aspirin EC 81 MG tablet Take 81 mg by mouth daily.  . Cholecalciferol (VITAMIN D) 50 MCG (2000 UT) tablet Take 2,000 Units by mouth daily.  . clobetasol ointment (TEMOVATE) 6.29 % Apply 1 application topically 2 (two) times daily.  Marland Kitchen co-enzyme Q-10 30 MG capsule Take 100 mg by mouth daily.  Marland Kitchen conjugated estrogens (PREMARIN) vaginal cream Place 1 Applicatorful vaginally 2 (two) times a week.   . fish oil-omega-3 fatty acids 1000 MG capsule Take 3 g by mouth daily.  Marland Kitchen nystatin-triamcinolone ointment (MYCOLOG) Apply 1 application topically as needed (yeast infection).  . pravastatin (PRAVACHOL) 40 MG tablet Take 1 tablet (40 mg total) by mouth every evening.  . Suvorexant (BELSOMRA) 15 MG TABS Take 1 tablet by mouth at bedtime as needed.  Marland Kitchen telmisartan (MICARDIS) 80 MG tablet Take 1 tablet (80 mg total) by mouth daily.  . [DISCONTINUED] arginine 500 MG tablet Take 3,000 mg by mouth in the morning and at bedtime.   . [DISCONTINUED] celecoxib (CELEBREX) 50 MG capsule Take 1 capsule (50 mg total) by mouth 2 (two) times daily.  . [DISCONTINUED] famotidine (PEPCID) 40 MG tablet Take 1 tablet (40 mg total) by mouth daily.  . [DISCONTINUED] traMADol (ULTRAM) 50 MG tablet Take 1 tablet (50 mg total) by mouth every 12 (twelve) hours as needed.     Allergies:   Amoxicillin-pot clavulanate, Beta adrenergic blockers, Latex, Meperidine hcl, and Hctz [hydrochlorothiazide]   Social History   Tobacco Use  . Smoking status: Never Smoker  . Smokeless tobacco: Never Used  Substance Use Topics  . Alcohol use: No  . Drug use: No     Family Hx: The patient's family history  includes Aneurysm in an other family member; Aneurysm (age of onset: 76) in her father; Arthritis in an other family member; Cancer in her brother, mother, and another family member; Diabetes in an other family member; Heart attack in her maternal grandfather; Heart disease in an other family member; Hypertension in her father and another family member; Stroke in her maternal grandmother and paternal grandfather.  ROS:   Please see the history of present illness.    Having some shoulder issues for which she is trialing injections with ortho All other systems reviewed and are negative.   Prior CV studies:   The following studies were reviewed today:  Cor CT 03/2020 CLINICAL DATA:  70 year old female with h/o hypertension, hyperlipidemia and exertional dyspnea.  EXAM: Cardiac/Coronary  CTA  TECHNIQUE: The patient was scanned on a [employer]  scanner.  FINDINGS: A 100 kV prospective scan was triggered in the descending thoracic aorta at 111 HU's. Axial non-contrast 3 mm slices were carried out through the heart. The data set was analyzed on a dedicated work station and scored using the [place]. Gantry rotation speed was 250 msecs and collimation was .6 mm. 10 mg of PO Ivabradine and 0.8 mg of sl NTG was given. The 3D data set was reconstructed in 5% intervals of the 67-82 % of the R-R cycle. Diastolic phases were analyzed on a dedicated work station using MPR, MIP and VRT modes. The patient received 80 cc of contrast.  Aorta: Normal size. Mild diffuse atherosclerotic plaque and calcifications. No dissection.  Aortic Valve:  Trileaflet.  No calcifications.  Coronary Arteries:  Normal coronary origin.  Right dominance.  RCA is a large dominant artery that gives rise to PDA and PLA. There is minimal calcified plaque in the ostial portion with stenosis 0-25%, mid and distal RCA have no significant plaque.  Left main is a large artery that gives rise to LAD and LCX  arteries. Left main has no plaque.  LAD is a medium caliber vessel that gives rise to one diagonal artery. There is minimal calcified plaque in the proximal portion with stenosis 0-25%.  LCX is a non-dominant artery that gives rise to one large OM1 branch. There is no significant plaque.  Other findings:  Normal pulmonary vein drainage into the left atrium.  Normal left atrial appendage without a thrombus.  Normal size of the pulmonary artery.  IMPRESSION: 1. Coronary calcium score of 6.2. This was 66 percentile for age and sex matched control.  2. Normal coronary origin with right dominance.  3. CAD-RADS 1. Minimal non-obstructive CAD (0-24%). Consider non-atherosclerotic causes of chest pain. Consider preventive therapy and risk factor modification.   Electronically Signed   By: Ena Dawley   On: 03/26/2020 14:24    Labs/Other Tests and Data Reviewed:    EKG:  An ECG dated 05/15/20 was personally reviewed today and demonstrated:  NSR 82bpm NSSTW changes similar to prior  Recent Labs: 05/15/2020: BUN 18; Creat 0.88; Hemoglobin 13.0; Platelets 251; Potassium 4.3; Sodium 142; TSH 1.26 07/19/2020: ALT 18   Recent Lipid Panel Lab Results  Component Value Date/Time   CHOL 136 07/19/2020 07:38 AM   TRIG 67 07/19/2020 07:38 AM   HDL 51 07/19/2020 07:38 AM   CHOLHDL 2.7 07/19/2020 07:38 AM   CHOLHDL 4 02/06/2020 08:43 AM   LDLCALC 71 07/19/2020 07:38 AM   LDLDIRECT 92.0 12/15/2016 09:34 AM    Wt Readings from Last 3 Encounters:  10/11/20 176 lb (79.8 kg)  08/19/20 181 lb (82.1 kg)  05/15/20 173 lb (78.5 kg)     Objective:    Vital Signs:  BP 111/64   Pulse 95   Ht '5\' 4"'  (1.626 m)   Wt 176 lb (79.8 kg)   BMI 30.21 kg/m    VS reviewed. General - calm F in no acute distress Pulm - No labored breathing, no coughing during visit, no audible wheezing, speaking in full sentences Neuro - A+Ox3, no slurred speech, answers questions  appropriately Psych - Pleasant affect  ASSESSMENT & PLAN:    1. Minimal CAD by CT 03/2020 - also with hx of microvascular angina. Sx quiescent. Continue present regimen and risk factor modification.  2. Hyperlipidemia goal LDL <70 - continue pravastatin at present dose since LDL near goal in November. Anticipate rechecking labs at 07/2019 follow-up unless they've  been done by PCP in the interim  3. Essential HTN -BP overall controlled by home values. This AM was an outlier, rechecked and normal on follow-up. She will continue to keep an eye on it as she is doing. Continue amlodipine and telmisartan.   Time:   Today, I have spent 10 minutes with the patient with telehealth technology discussing the above problems.     Medication Adjustments/Labs and Tests Ordered: Current medicines are reviewed at length with the patient today.  Testing and concerns regarding medicines are outlined above.    Follow Up: In Person in 07/2021 with me (Dr. Meda Coffee will likely be moving before then per preliminarily discussions, so will eventually need to re-establish with new cardiologist)  Signed, Charlie Pitter, PA-C  10/11/2020 9:39 AM    [place]

## 2020-10-11 ENCOUNTER — Telehealth: Payer: Self-pay | Admitting: *Deleted

## 2020-10-11 ENCOUNTER — Encounter: Payer: Self-pay | Admitting: Physician Assistant

## 2020-10-11 ENCOUNTER — Other Ambulatory Visit: Payer: Self-pay

## 2020-10-11 ENCOUNTER — Telehealth (INDEPENDENT_AMBULATORY_CARE_PROVIDER_SITE_OTHER): Payer: Medicare PPO | Admitting: Physician Assistant

## 2020-10-11 VITALS — BP 111/64 | HR 95 | Ht 64.0 in | Wt 176.0 lb

## 2020-10-11 DIAGNOSIS — E785 Hyperlipidemia, unspecified: Secondary | ICD-10-CM

## 2020-10-11 DIAGNOSIS — I2510 Atherosclerotic heart disease of native coronary artery without angina pectoris: Secondary | ICD-10-CM

## 2020-10-11 DIAGNOSIS — I10 Essential (primary) hypertension: Secondary | ICD-10-CM | POA: Diagnosis not present

## 2020-10-11 NOTE — Telephone Encounter (Signed)
  Patient Consent for Virtual Visit         Kayla Khan has provided verbal consent on 10/11/2020 for a virtual visit (video or telephone).   CONSENT FOR VIRTUAL VISIT FOR:  Kayla Khan  By participating in this virtual visit I agree to the following:  I hereby voluntarily request, consent and authorize [facility] and its employed or contracted physicians, Engineer, materials, nurse practitioners or other licensed health care professionals (the Practitioner), to provide me with telemedicine health care services (the "Services") as deemed necessary by the treating Practitioner. I acknowledge and consent to receive the Services by the Practitioner via telemedicine. I understand that the telemedicine visit will involve communicating with the Practitioner through live audiovisual communication technology and the disclosure of certain medical information by electronic transmission. I acknowledge that I have been given the opportunity to request an in-person assessment or other available alternative prior to the telemedicine visit and am voluntarily participating in the telemedicine visit.  I understand that I have the right to withhold or withdraw my consent to the use of telemedicine in the course of my care at any time, without affecting my right to future care or treatment, and that the Practitioner or I may terminate the telemedicine visit at any time. I understand that I have the right to inspect all information obtained and/or recorded in the course of the telemedicine visit and may receive copies of available information for a reasonable fee.  I understand that some of the potential risks of receiving the Services via telemedicine include:  Marland Kitchen Delay or interruption in medical evaluation due to technological equipment failure or disruption; . Information transmitted may not be sufficient (e.g. poor resolution of images) to allow for appropriate medical decision making by the Practitioner;  and/or  . In rare instances, security protocols could fail, causing a breach of personal health information.  Furthermore, I acknowledge that it is my responsibility to provide information about my medical history, conditions and care that is complete and accurate to the best of my ability. I acknowledge that Practitioner's advice, recommendations, and/or decision may be based on factors not within their control, such as incomplete or inaccurate data provided by me or distortions of diagnostic images or specimens that may result from electronic transmissions. I understand that the practice of medicine is not an exact science and that Practitioner makes no warranties or guarantees regarding treatment outcomes. I acknowledge that a copy of this consent can be made available to me via my patient portal ([facility]), or I can request a printed copy by calling the office of [facility].    I understand that my insurance will be billed for this visit.   I have read or had this consent read to me. . I understand the contents of this consent, which adequately explains the benefits and risks of the Services being provided via telemedicine.  . I have been provided ample opportunity to ask questions regarding this consent and the Services and have had my questions answered to my satisfaction. . I give my informed consent for the services to be provided through the use of telemedicine in my medical care

## 2020-10-11 NOTE — Patient Instructions (Signed)
Medication Instructions:  Your physician recommends that you continue on your current medications as directed. Please refer to the Current Medication list given to you today.  *If you need a refill on your cardiac medications before your next appointment, please call your pharmacy*   Lab Work: None ordered  If you have labs (blood work) drawn today and your tests are completely normal, you will receive your results only by: Marland Kitchen MyChart Message (if you have MyChart) OR . A paper copy in the mail If you have any lab test that is abnormal or we need to change your treatment, we will call you to review the results.   Testing/Procedures: None ordered   Follow-Up: At [facility], you and your health needs are our priority.  As part of our continuing mission to provide you with exceptional heart care, we have created designated Provider Care Teams.  These Care Teams include your primary Cardiologist (physician) and Advanced Practice Providers (APPs -  Physician Assistants and Nurse Practitioners) who all work together to provide you with the care you need, when you need it.  We recommend signing up for the patient portal called "MyChart".  Sign up information is provided on this After Visit Summary.  MyChart is used to connect with patients for Virtual Visits (Telemedicine).  Patients are able to view lab/test results, encounter notes, upcoming appointments, etc.  Non-urgent messages can be sent to your provider as well.   To learn more about what you can do with MyChart, go to [email or web address].    Your next appointment:   9 month(s)   COME TO THE OFFICE AT THIS APPOINTMENT FASTING SO WE CAN GET SOME LABS ON THIS DAY   The format for your next appointment:   In Person  Provider:   Melina Copa, PA-C   Other Instructions

## 2020-10-14 DIAGNOSIS — M25512 Pain in left shoulder: Secondary | ICD-10-CM | POA: Diagnosis not present

## 2020-10-14 DIAGNOSIS — M67912 Unspecified disorder of synovium and tendon, left shoulder: Secondary | ICD-10-CM | POA: Diagnosis not present

## 2020-10-14 DIAGNOSIS — M6281 Muscle weakness (generalized): Secondary | ICD-10-CM | POA: Diagnosis not present

## 2020-10-14 DIAGNOSIS — M7542 Impingement syndrome of left shoulder: Secondary | ICD-10-CM | POA: Diagnosis not present

## 2020-10-17 DIAGNOSIS — G4733 Obstructive sleep apnea (adult) (pediatric): Secondary | ICD-10-CM | POA: Diagnosis not present

## 2020-10-25 DIAGNOSIS — M25512 Pain in left shoulder: Secondary | ICD-10-CM | POA: Diagnosis not present

## 2020-10-25 DIAGNOSIS — M6281 Muscle weakness (generalized): Secondary | ICD-10-CM | POA: Diagnosis not present

## 2020-10-25 DIAGNOSIS — M7542 Impingement syndrome of left shoulder: Secondary | ICD-10-CM | POA: Diagnosis not present

## 2020-10-28 DIAGNOSIS — M6281 Muscle weakness (generalized): Secondary | ICD-10-CM | POA: Diagnosis not present

## 2020-10-28 DIAGNOSIS — M7542 Impingement syndrome of left shoulder: Secondary | ICD-10-CM | POA: Diagnosis not present

## 2020-10-28 DIAGNOSIS — M25512 Pain in left shoulder: Secondary | ICD-10-CM | POA: Diagnosis not present

## 2020-11-01 DIAGNOSIS — M25512 Pain in left shoulder: Secondary | ICD-10-CM | POA: Diagnosis not present

## 2020-11-01 DIAGNOSIS — M7542 Impingement syndrome of left shoulder: Secondary | ICD-10-CM | POA: Diagnosis not present

## 2020-11-01 DIAGNOSIS — M6281 Muscle weakness (generalized): Secondary | ICD-10-CM | POA: Diagnosis not present

## 2020-11-04 DIAGNOSIS — M6281 Muscle weakness (generalized): Secondary | ICD-10-CM | POA: Diagnosis not present

## 2020-11-04 DIAGNOSIS — M7542 Impingement syndrome of left shoulder: Secondary | ICD-10-CM | POA: Diagnosis not present

## 2020-11-04 DIAGNOSIS — M25512 Pain in left shoulder: Secondary | ICD-10-CM | POA: Diagnosis not present

## 2020-11-07 ENCOUNTER — Other Ambulatory Visit: Payer: Self-pay | Admitting: Internal Medicine

## 2020-11-07 DIAGNOSIS — G4733 Obstructive sleep apnea (adult) (pediatric): Secondary | ICD-10-CM | POA: Diagnosis not present

## 2020-11-07 DIAGNOSIS — I10 Essential (primary) hypertension: Secondary | ICD-10-CM

## 2020-11-07 DIAGNOSIS — I700 Atherosclerosis of aorta: Secondary | ICD-10-CM

## 2020-11-08 DIAGNOSIS — M25512 Pain in left shoulder: Secondary | ICD-10-CM | POA: Diagnosis not present

## 2020-11-08 DIAGNOSIS — M7542 Impingement syndrome of left shoulder: Secondary | ICD-10-CM | POA: Diagnosis not present

## 2020-11-08 DIAGNOSIS — M6281 Muscle weakness (generalized): Secondary | ICD-10-CM | POA: Diagnosis not present

## 2020-11-14 DIAGNOSIS — G4733 Obstructive sleep apnea (adult) (pediatric): Secondary | ICD-10-CM | POA: Diagnosis not present

## 2020-11-22 ENCOUNTER — Other Ambulatory Visit: Payer: Self-pay

## 2020-11-22 ENCOUNTER — Ambulatory Visit (INDEPENDENT_AMBULATORY_CARE_PROVIDER_SITE_OTHER): Payer: Medicare PPO

## 2020-11-22 VITALS — BP 124/70 | HR 73 | Temp 97.7°F | Ht 64.0 in | Wt 182.6 lb

## 2020-11-22 DIAGNOSIS — M25512 Pain in left shoulder: Secondary | ICD-10-CM | POA: Diagnosis not present

## 2020-11-22 DIAGNOSIS — M7542 Impingement syndrome of left shoulder: Secondary | ICD-10-CM | POA: Diagnosis not present

## 2020-11-22 DIAGNOSIS — Z0000 Encounter for general adult medical examination without abnormal findings: Secondary | ICD-10-CM

## 2020-11-22 DIAGNOSIS — M6281 Muscle weakness (generalized): Secondary | ICD-10-CM | POA: Diagnosis not present

## 2020-11-22 NOTE — Patient Instructions (Signed)
Kayla Khan , Thank you for taking time to come for your Medicare Wellness Visit. I appreciate your ongoing commitment to your health goals. Please review the following plan we discussed and let me know if I can assist you in the future.   Screening recommendations/referrals: Colonoscopy: 02/24/2020; due every 3 years (Cologuard) Mammogram: 03/07/2020; due every 1-2 years Bone Density: 04/08/2015; results normal Recommended yearly ophthalmology/optometry visit for glaucoma screening and checkup Recommended yearly dental visit for hygiene and checkup  Vaccinations: Influenza vaccine: 05/07/2020 Pneumococcal vaccine: 03/22/2013, 12/14/2016 Tdap vaccine: 12/14/2016; due every 10 years Shingles vaccine: 12/15/2016, 06/16/2017   Covid-19: 10/13/2019, 11/07/2019, 06/03/2020  Advanced directives: Please bring a copy of your health care power of attorney and living will to the office at your convenience.  Conditions/risks identified: Yes; Reviewed health maintenance screenings with patient today and relevant education, vaccines, and/or referrals were provided. Please continue to do your personal lifestyle choices by: daily care of teeth and gums, regular physical activity (goal should be 5 days a week for 30 minutes), eat a healthy diet, avoid tobacco and drug use, limiting any alcohol intake, taking a low-dose aspirin (if not allergic or have been advised by your provider otherwise) and taking vitamins and minerals as recommended by your provider. Continue doing brain stimulating activities (puzzles, reading, adult coloring books, staying active) to keep memory sharp. Continue to eat heart healthy diet (full of fruits, vegetables, whole grains, lean protein, water--limit salt, fat, and sugar intake) and increase physical activity as tolerated.  Next appointment: Please schedule your next Medicare Wellness Visit with your Nurse Health Advisor in 1 year by calling [phone].  Preventive Care 18 Years and Older,  Female Preventive care refers to lifestyle choices and visits with your health care provider that can promote health and wellness. What does preventive care include?  A yearly physical exam. This is also called an annual well check.  Dental exams once or twice a year.  Routine eye exams. Ask your health care provider how often you should have your eyes checked.  Personal lifestyle choices, including:  Daily care of your teeth and gums.  Regular physical activity.  Eating a healthy diet.  Avoiding tobacco and drug use.  Limiting alcohol use.  Practicing safe sex.  Taking low-dose aspirin every day.  Taking vitamin and mineral supplements as recommended by your health care provider. What happens during an annual well check? The services and screenings done by your health care provider during your annual well check will depend on your age, overall health, lifestyle risk factors, and family history of disease. Counseling  Your health care provider may ask you questions about your:  Alcohol use.  Tobacco use.  Drug use.  Emotional well-being.  Home and relationship well-being.  Sexual activity.  Eating habits.  History of falls.  Memory and ability to understand (cognition).  Work and work Statistician.  Reproductive health. Screening  You may have the following tests or measurements:  Height, weight, and BMI.  Blood pressure.  Lipid and cholesterol levels. These may be checked every 5 years, or more frequently if you are over 27 years old.  Skin check.  Lung cancer screening. You may have this screening every year starting at age 41 if you have a 30-pack-year history of smoking and currently smoke or have quit within the past 15 years.  Fecal occult blood test (FOBT) of the stool. You may have this test every year starting at age 38.  Flexible sigmoidoscopy or colonoscopy. You may  have a sigmoidoscopy every 5 years or a colonoscopy every 10 years  starting at age 54.  Hepatitis C blood test.  Hepatitis B blood test.  Sexually transmitted disease (STD) testing.  Diabetes screening. This is done by checking your blood sugar (glucose) after you have not eaten for a while (fasting). You may have this done every 1-3 years.  Bone density scan. This is done to screen for osteoporosis. You may have this done starting at age 38.  Mammogram. This may be done every 1-2 years. Talk to your health care provider about how often you should have regular mammograms. Talk with your health care provider about your test results, treatment options, and if necessary, the need for more tests. Vaccines  Your health care provider may recommend certain vaccines, such as:  Influenza vaccine. This is recommended every year.  Tetanus, diphtheria, and acellular pertussis (Tdap, Td) vaccine. You may need a Td booster every 10 years.  Zoster vaccine. You may need this after age 67.  Pneumococcal 13-valent conjugate (PCV13) vaccine. One dose is recommended after age 44.  Pneumococcal polysaccharide (PPSV23) vaccine. One dose is recommended after age 38. Talk to your health care provider about which screenings and vaccines you need and how often you need them. This information is not intended to replace advice given to you by your health care provider. Make sure you discuss any questions you have with your health care provider. Document Released: 09/20/2015 Document Revised: 05/13/2016 Document Reviewed: 06/25/2015 Elsevier Interactive Patient Education  2017 [place] Prevention in the Home Falls can cause injuries. They can happen to people of all ages. There are many things you can do to make your home safe and to help prevent falls. What can I do on the outside of my home?  Regularly fix the edges of walkways and driveways and fix any cracks.  Remove anything that might make you trip as you walk through a door, such as a raised step or  threshold.  Trim any bushes or trees on the path to your home.  Use bright outdoor lighting.  Clear any walking paths of anything that might make someone trip, such as rocks or tools.  Regularly check to see if handrails are loose or broken. Make sure that both sides of any steps have handrails.  Any raised decks and porches should have guardrails on the edges.  Have any leaves, snow, or ice cleared regularly.  Use sand or salt on walking paths during winter.  Clean up any spills in your garage right away. This includes oil or grease spills. What can I do in the bathroom?  Use night lights.  Install grab bars by the toilet and in the tub and shower. Do not use towel bars as grab bars.  Use non-skid mats or decals in the tub or shower.  If you need to sit down in the shower, use a plastic, non-slip stool.  Keep the floor dry. Clean up any water that spills on the floor as soon as it happens.  Remove soap buildup in the tub or shower regularly.  Attach bath mats securely with double-sided non-slip rug tape.  Do not have throw rugs and other things on the floor that can make you trip. What can I do in the bedroom?  Use night lights.  Make sure that you have a light by your bed that is easy to reach.  Do not use any sheets or blankets that are too big for your  bed. They should not hang down onto the floor.  Have a firm chair that has side arms. You can use this for support while you get dressed.  Do not have throw rugs and other things on the floor that can make you trip. What can I do in the kitchen?  Clean up any spills right away.  Avoid walking on wet floors.  Keep items that you use a lot in easy-to-reach places.  If you need to reach something above you, use a strong step stool that has a grab bar.  Keep electrical cords out of the way.  Do not use floor polish or wax that makes floors slippery. If you must use wax, use non-skid floor wax.  Do not have  throw rugs and other things on the floor that can make you trip. What can I do with my stairs?  Do not leave any items on the stairs.  Make sure that there are handrails on both sides of the stairs and use them. Fix handrails that are broken or loose. Make sure that handrails are as long as the stairways.  Check any carpeting to make sure that it is firmly attached to the stairs. Fix any carpet that is loose or worn.  Avoid having throw rugs at the top or bottom of the stairs. If you do have throw rugs, attach them to the floor with carpet tape.  Make sure that you have a light switch at the top of the stairs and the bottom of the stairs. If you do not have them, ask someone to add them for you. What else can I do to help prevent falls?  Wear shoes that:  Do not have high heels.  Have rubber bottoms.  Are comfortable and fit you well.  Are closed at the toe. Do not wear sandals.  If you use a stepladder:  Make sure that it is fully opened. Do not climb a closed stepladder.  Make sure that both sides of the stepladder are locked into place.  Ask someone to hold it for you, if possible.  Clearly mark and make sure that you can see:  Any grab bars or handrails.  First and last steps.  Where the edge of each step is.  Use tools that help you move around (mobility aids) if they are needed. These include:  Canes.  Walkers.  Scooters.  Crutches.  Turn on the lights when you go into a dark area. Replace any light bulbs as soon as they burn out.  Set up your furniture so you have a clear path. Avoid moving your furniture around.  If any of your floors are uneven, fix them.  If there are any pets around you, be aware of where they are.  Review your medicines with your doctor. Some medicines can make you feel dizzy. This can increase your chance of falling. Ask your doctor what other things that you can do to help prevent falls. This information is not intended to  replace advice given to you by your health care provider. Make sure you discuss any questions you have with your health care provider. Document Released: 06/20/2009 Document Revised: 01/30/2016 Document Reviewed: 09/28/2014 Elsevier Interactive Patient Education  2017 [employer].

## 2020-11-22 NOTE — Progress Notes (Signed)
Subjective:   Kayla Khan is a 70 y.o. female who presents for Medicare Annual (Subsequent) preventive examination.  Review of Systems    No ROS. Medicare Wellness Visit. Additional risk factors are reflected in social history. Cardiac Risk Factors include: advanced age (>5mn, >>39women);dyslipidemia;hypertension;family history of premature cardiovascular disease;obesity (BMI >30kg/m2)     Objective:    Today's Vitals   11/22/20 0935  BP: 124/70  Pulse: 73  Temp: 97.7 F (36.5 C)  SpO2: 96%  Weight: 182 lb 9.6 oz (82.8 kg)  Height: '5\' 4"'  (1.626 m)   Body mass index is 31.34 kg/m.  Advanced Directives 11/22/2020 12/15/2016 04/26/2015  Does Patient Have a Medical Advance Directive? Yes Yes No  Type of Advance Directive Living will;Healthcare Power of [place] will -  Does patient want to make changes to medical advance directive? No - Patient declined - -  Copy of [place] Chart? No - copy requested Yes -  Would patient like information on creating a medical advance directive? - - No - patient declined information    Current Medications (verified) Outpatient Encounter Medications as of 11/22/2020  Medication Sig  . ALPRAZolam (XANAX) 0.25 MG tablet Take 1 tablet (0.25 mg total) by mouth 2 (two) times daily as needed.  .Marland KitchenamLODipine (NORVASC) 5 MG tablet TAKE 1 TABLET(5 MG) BY MOUTH DAILY  . Arginine 1000 MG TABS Take 3,000 mg by mouth in the morning and at bedtime.  . Ascorbic Acid (VITAMIN C) 1000 MG tablet Take 1,000 mg by mouth daily.  .Marland Kitchenaspirin EC 81 MG tablet Take 81 mg by mouth daily.  . Cholecalciferol (VITAMIN D) 50 MCG (2000 UT) tablet Take 2,000 Units by mouth daily.  . clobetasol ointment (TEMOVATE) 02.33% Apply 1 application topically 2 (two) times daily.  .Marland Kitchenco-enzyme Q-10 30 MG capsule Take 100 mg by mouth daily.  .Marland Kitchenconjugated estrogens (PREMARIN) vaginal cream Place 1 Applicatorful vaginally 2  (two) times a week.   . fish oil-omega-3 fatty acids 1000 MG capsule Take 3 g by mouth daily.  . pravastatin (PRAVACHOL) 40 MG tablet Take 1 tablet (40 mg total) by mouth every evening.  . Suvorexant (BELSOMRA) 15 MG TABS Take 1 tablet by mouth at bedtime as needed.  .Marland Kitchentelmisartan (MICARDIS) 80 MG tablet Take 1 tablet (80 mg total) by mouth daily.  .Marland Kitchennystatin-triamcinolone ointment (MYCOLOG) Apply 1 application topically as needed (yeast infection). (Patient not taking: Reported on 11/22/2020)   No facility-administered encounter medications on file as of 11/22/2020.    Allergies (verified) Amoxicillin-pot clavulanate, Beta adrenergic blockers, Latex, Meperidine hcl, and Hctz [hydrochlorothiazide]   History: Past Medical History:  Diagnosis Date  . Allergic conjunctivitis   . Anemia, iron deficiency   . Arthritis   . Asthma   . 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 Depression with anxiety   . Diverticulosis of colon   . DJD (degenerative joint disease)   . Ejection fraction    70%, catheter 2004 / normal LV function nuclear, 2008  . Essential hypertension   . Fatigue   . GERD (gastroesophageal reflux disease)   . HLA B27 positive   . HLD (hyperlipidemia)   . Microvascular angina (HCC)    a. h/o such - cor CT 03/2020 with minimal CAD, consider noncardiac causes of pain.  . Palpitations   . Pre-diabetes   .  Sleep apnea   . Thyroid nodule    The patient had a thyroid nodule that was surgically removed in the past.  She has one half of her thyroid and does not take replacement of Taxol and he was benign   Past Surgical History:  Procedure Laterality Date  . APPENDECTOMY    . BREAST BIOPSY    . CESAREAN SECTION     x2  . GANGLION CYST EXCISION    . THYROIDECTOMY, PARTIAL     left  . TUBAL LIGATION    . VESICOVAGINAL FISTULA CLOSURE W/ TAH     Family History  Problem Relation Age of  Onset  . Cancer Mother   . Hypertension Father   . Aneurysm Father 42       cerebral hemorrhage  . Diabetes Other   . Heart disease Other   . Cancer Other   . Arthritis Other   . Hypertension Other   . Aneurysm Other   . Heart attack Maternal Grandfather   . Stroke Paternal Grandfather   . Stroke Maternal Grandmother   . Cancer Brother    Social History   Socioeconomic History  . Marital status: Divorced    Spouse name: Not on file  . Number of children: Not on file  . Years of education: Not on file  . Highest education level: Not on file  Occupational History  . Occupation: Pharmacist, hospital  Tobacco Use  . Smoking status: Never Smoker  . Smokeless tobacco: Never Used  Substance and Sexual Activity  . Alcohol use: No  . Drug use: No  . Sexual activity: Not Currently  Other Topics Concern  . Not on file  Social History Narrative  . Not on file   Social Determinants of Health   Financial Resource Strain: Low Risk   . Difficulty of Paying Living Expenses: Not hard at all  Food Insecurity: No Food Insecurity  . Worried About Charity fundraiser in the Last Year: Never true  . Ran Out of Food in the Last Year: Never true  Transportation Needs: No Transportation Needs  . Lack of Transportation (Medical): No  . Lack of Transportation (Non-Medical): No  Physical Activity: Sufficiently Active  . Days of Exercise per Week: 5 days  . Minutes of Exercise per Session: 30 min  Stress: No Stress Concern Present  . Feeling of Stress : Not at all  Social Connections: Moderately Integrated  . Frequency of Communication with Friends and Family: More than three times a week  . Frequency of Social Gatherings with Friends and Family: More than three times a week  . Attends Religious Services: More than 4 times per year  . Active Member of Clubs or Organizations: Yes  . Attends Archivist Meetings: More than 4 times per year  . 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 no  Interpreter Needed?: No  Information entered by :: Lisette Abu, LPN   Activities of Daily Living In your present state of health, do  you have any difficulty performing the following activities: 11/22/2020 08/19/2020  Hearing? N N  Vision? N N  Difficulty concentrating or making decisions? N N  Walking or climbing stairs? N N  Dressing or bathing? N N  Doing errands, shopping? N N  Preparing Food and eating ? N -  Using the Toilet? N -  In the past six months, have you accidently leaked urine? N -  Do you have problems with loss of bowel control? N -  Managing your Medications? N -  Managing your Finances? N -  Housekeeping or managing your Housekeeping? N -  Some recent data might be hidden    Patient Care Team: Janith Lima, MD as PCP - General Dorothy Spark, MD as PCP - Cardiology (Cardiology)  Indicate any recent Medical Services you may have received from other than [facility] providers in the past year (date may be approximate).     Assessment:   This is a routine wellness examination for Magie.  Hearing/Vision screen No exam data present  Dietary issues and exercise activities discussed: Current Exercise Habits: Structured exercise class, Type of exercise: treadmill;walking;stretching;strength training/weights;Other - see comments (ellipitical), Time (Minutes): 30, Frequency (Times/Week): 5, Weekly Exercise (Minutes/Week): 150, Intensity: Moderate, Exercise limited by: None  identified  Goals    . Patient Stated     I have rejoined the gym and will start doing 2 times a week by using the elliptical, treadmill and doing some stretching.      Depression Screen PHQ 2/9 Scores 11/22/2020 08/19/2020 05/15/2020 02/06/2020 03/09/2019 01/05/2018 12/15/2016  PHQ - 2 Score 0 0 2 0 3 0 0  PHQ- 9 Score - 0 7 0 6 0 -  Exception Documentation - - - - - - -    Fall Risk Fall Risk  11/22/2020 04/01/2020 03/09/2019 01/05/2018 12/22/2017  Falls in the past year? 0 0 1 No Yes  Number falls in past yr: 0 0 0 - 1  Injury with Fall? 0 0 0 - Yes  Risk for fall due to : No Fall Risks No Fall Risks - - -  Follow up Falls evaluation completed Falls evaluation completed Falls evaluation completed - -    FALL RISK PREVENTION PERTAINING TO THE HOME:  Any stairs in or around the home? Yes  If so, are there any without handrails? No  Home free of loose throw rugs in walkways, pet beds, electrical cords, etc? Yes  Adequate lighting in your home to reduce risk of falls? Yes   ASSISTIVE DEVICES UTILIZED TO PREVENT FALLS:  Life alert? No  Use of a cane, walker or w/c? No  Grab bars in the bathroom? Yes  Shower chair or bench in shower? No  Elevated toilet seat or a handicapped toilet? No   TIMED UP AND GO:  Was the test performed? No .  Length of time to ambulate 10 feet: 0 sec.   Gait steady and fast without use of assistive device  Cognitive Function: Normal cognitive status assessed by direct observation by this Nurse Health Advisor. No abnormalities found.          Immunizations Immunization History  Administered Date(s) Administered  . DTaP 10/18/2004  . Influenza Split 07/08/2012, 04/16/2016  . Influenza Whole 06/19/2010, 06/30/2011  . Influenza, High Dose Seasonal PF 04/24/2017, 06/06/2018, 04/26/2019, 05/07/2020  . Influenza,inj,Quad PF,6+ Mos 07/03/2013  . Influenza-Unspecified 06/07/2014, 05/24/2017  . PFIZER(Purple Top)SARS-COV-2 Vaccination 10/13/2019, 11/07/2019,  06/03/2020  . Pneumococcal Conjugate-13 03/22/2013  . Pneumococcal  Polysaccharide-23 04/28/2007, 12/14/2016  . Tdap 12/14/2016  . Zoster 06/19/2010  . Zoster Recombinat (Shingrix) 12/15/2016, 06/16/2017    TDAP status: Up to date  Flu Vaccine status: Up to date  Pneumococcal vaccine status: Up to date  Covid-19 vaccine status: Completed vaccines  Qualifies for Shingles Vaccine? Yes   Zostavax completed Yes   Shingrix Completed?: Yes  Screening Tests Health Maintenance  Topic Date Due  . COVID-19 Vaccine (4 - Booster for Pfizer series) 12/01/2020  . MAMMOGRAM  03/05/2021  . Fecal DNA (Cologuard)  02/20/2023  . TETANUS/TDAP  12/15/2026  . INFLUENZA VACCINE  Completed  . DEXA SCAN  Completed  . Hepatitis C Screening  Completed  . PNA vac Low Risk Adult  Completed  . HPV VACCINES  Aged Out    Health Maintenance  There are no preventive care reminders to display for this patient.  Colorectal cancer screening: Type of screening: Cologuard. Completed 02/24/2020. Repeat every 3 years  Mammogram status: Completed 03/07/2020. Repeat every year  Bone Density status: Completed 04/08/2015. Results reflect: Bone density results: NORMAL. Repeat every 0 years.  (completed)  Lung Cancer Screening: (Low Dose CT Chest recommended if Age 62-80 years, 30 pack-year currently smoking OR have quit w/in 15years.) does not qualify.   Lung Cancer Screening Referral: no  Additional Screening:  Hepatitis C Screening: does qualify; Completed yes  Vision Screening: Recommended annual ophthalmology exams for early detection of glaucoma and other disorders of the eye. Is the patient up to date with their annual eye exam?  Yes  Who is the provider or what is the name of the office in which the patient attends annual eye exams? Dr. Marygrace Drought If pt is not established with a provider, would they like to be referred to a provider to establish care? No .   Dental Screening: Recommended annual  dental exams for proper oral hygiene  Community Resource Referral / Chronic Care Management: CRR required this visit?  No   CCM required this visit?  No      Plan:     I have personally reviewed and noted the following in the patient's chart:   . Medical and social history . Use of alcohol, tobacco or illicit drugs  . Current medications and supplements . Functional ability and status . Nutritional status . Physical activity . Advanced directives . List of other physicians . Hospitalizations, surgeries, and ER visits in previous 12 months . Vitals . Screenings to include cognitive, depression, and falls . Referrals and appointments  In addition, I have reviewed and discussed with patient certain preventive protocols, quality metrics, and best practice recommendations. A written personalized care plan for preventive services as well as general preventive health recommendations were provided to patient.     Sheral Flow, LPN   [id number]   Nurse Notes:  Medications reviewed with patient; no opioid use noted.

## 2020-11-25 DIAGNOSIS — M6281 Muscle weakness (generalized): Secondary | ICD-10-CM | POA: Diagnosis not present

## 2020-11-25 DIAGNOSIS — M7542 Impingement syndrome of left shoulder: Secondary | ICD-10-CM | POA: Diagnosis not present

## 2020-11-25 DIAGNOSIS — M25512 Pain in left shoulder: Secondary | ICD-10-CM | POA: Diagnosis not present

## 2020-11-28 ENCOUNTER — Other Ambulatory Visit: Payer: Self-pay

## 2020-11-28 ENCOUNTER — Encounter: Payer: Self-pay | Admitting: Physician Assistant

## 2020-11-28 ENCOUNTER — Ambulatory Visit: Payer: Medicare PPO | Admitting: Physician Assistant

## 2020-11-28 DIAGNOSIS — D485 Neoplasm of uncertain behavior of skin: Secondary | ICD-10-CM

## 2020-11-28 DIAGNOSIS — Z1283 Encounter for screening for malignant neoplasm of skin: Secondary | ICD-10-CM

## 2020-11-28 DIAGNOSIS — D1801 Hemangioma of skin and subcutaneous tissue: Secondary | ICD-10-CM

## 2020-11-28 DIAGNOSIS — L281 Prurigo nodularis: Secondary | ICD-10-CM | POA: Diagnosis not present

## 2020-11-28 DIAGNOSIS — L570 Actinic keratosis: Secondary | ICD-10-CM | POA: Diagnosis not present

## 2020-11-28 DIAGNOSIS — L821 Other seborrheic keratosis: Secondary | ICD-10-CM

## 2020-11-28 DIAGNOSIS — L820 Inflamed seborrheic keratosis: Secondary | ICD-10-CM | POA: Diagnosis not present

## 2020-11-28 NOTE — Patient Instructions (Signed)

## 2020-11-29 DIAGNOSIS — H2513 Age-related nuclear cataract, bilateral: Secondary | ICD-10-CM | POA: Diagnosis not present

## 2020-11-29 DIAGNOSIS — H25012 Cortical age-related cataract, left eye: Secondary | ICD-10-CM | POA: Diagnosis not present

## 2020-11-29 DIAGNOSIS — H524 Presbyopia: Secondary | ICD-10-CM | POA: Diagnosis not present

## 2020-11-29 DIAGNOSIS — H43813 Vitreous degeneration, bilateral: Secondary | ICD-10-CM | POA: Diagnosis not present

## 2020-12-02 DIAGNOSIS — M25512 Pain in left shoulder: Secondary | ICD-10-CM | POA: Diagnosis not present

## 2020-12-02 DIAGNOSIS — M6281 Muscle weakness (generalized): Secondary | ICD-10-CM | POA: Diagnosis not present

## 2020-12-02 DIAGNOSIS — M7542 Impingement syndrome of left shoulder: Secondary | ICD-10-CM | POA: Diagnosis not present

## 2020-12-06 DIAGNOSIS — M6281 Muscle weakness (generalized): Secondary | ICD-10-CM | POA: Diagnosis not present

## 2020-12-06 DIAGNOSIS — M25512 Pain in left shoulder: Secondary | ICD-10-CM | POA: Diagnosis not present

## 2020-12-06 DIAGNOSIS — M7542 Impingement syndrome of left shoulder: Secondary | ICD-10-CM | POA: Diagnosis not present

## 2020-12-09 ENCOUNTER — Encounter: Payer: Self-pay | Admitting: Physician Assistant

## 2020-12-09 NOTE — Progress Notes (Signed)
Follow-Up Visit   Subjective  Kayla Khan is a 70 y.o. female who presents for the following: Annual Exam (Full body skin check. Lesion on left temple x years, no bleeding, scaly, goes away and comes back. Lesion on left cheek that's rough x months, scaly. Mid back of scalp sclay, itches, has previuosly been frozen, but comes back, continues to grow. ).   The following portions of the chart were reviewed this encounter and updated as appropriate:  Tobacco  Allergies  Meds  Problems  Med Hx  Surg Hx  Fam Hx      Objective  Well appearing patient in no apparent distress; mood and affect are within normal limits.  A full examination was performed including scalp, head, eyes, ears, nose, lips, neck, chest, axillae, abdomen, back, buttocks, bilateral upper extremities, bilateral lower extremities, hands, feet, fingers, toes, fingernails, and toenails. All findings within normal limits unless otherwise noted below.  Objective  Left Abdomen (side) - Lower: Full body skin check  Objective  Chest - Medial (Center), Mid Back: waxy brown, black, or tan growth.  Objective  Left Anterior Lobule, Mid Tip of Nose (3), Right Parotid Area: Erythematous patches with gritty scale.   Objective  Mid Occipital Scalp: Volcano growth on pink base No measurement per KS      Objective  Left Preauricular Area: Hyperkeratotic scale with pink base No measurement per KS      Assessment & Plan  Screening exam for skin cancer Left Abdomen (side) - Lower  Yearly skin check  Seborrheic keratosis (2) Chest - Medial (Center); Mid Back  Stable to leave   AK (actinic keratosis) (5) Left Anterior Lobule; Mid Tip of Nose (3); Right Parotid Area  Destruction of lesion - Left Anterior Lobule, Mid Tip of Nose, Right Parotid Area Complexity: simple   Destruction method: cryotherapy   Informed consent: discussed and consent obtained   Timeout:  patient name, date of birth, surgical site,  and procedure verified Lesion destroyed using liquid nitrogen: Yes   Cryotherapy cycles:  5 Outcome: patient tolerated procedure well with no complications   Post-procedure details: wound care instructions given    Neoplasm of uncertain behavior of skin (2) Mid Occipital Scalp  Skin / nail biopsy Type of biopsy: tangential   Informed consent: discussed and consent obtained   Timeout: patient name, date of birth, surgical site, and procedure verified   Procedure prep:  Patient was prepped and draped in usual sterile fashion (Non sterile) Prep type:  Chlorhexidine Anesthesia: the lesion was anesthetized in a standard fashion   Anesthetic:  1% lidocaine w/ epinephrine 1-100,000 local infiltration Instrument used: flexible razor blade   Outcome: patient tolerated procedure well   Post-procedure details: wound care instructions given    Specimen 1 - Surgical pathology Differential Diagnosis: r/o sk  Check Margins: No  Left Preauricular Area  Skin / nail biopsy Type of biopsy: tangential   Informed consent: discussed and consent obtained   Timeout: patient name, date of birth, surgical site, and procedure verified   Procedure prep:  Patient was prepped and draped in usual sterile fashion (Non sterile) Prep type:  Chlorhexidine Anesthesia: the lesion was anesthetized in a standard fashion   Anesthetic:  1% lidocaine w/ epinephrine 1-100,000 local infiltration Instrument used: flexible razor blade   Outcome: patient tolerated procedure well   Post-procedure details: wound care instructions given    Specimen 2 - Surgical pathology Differential Diagnosis: r/o sk  Check Margins: No  I, Quantisha Marsicano, PA-C, have reviewed all documentation's for this visit.  The documentation on 12/09/20 for the exam, diagnosis, procedures and orders are all accurate and complete.

## 2020-12-12 DIAGNOSIS — H25012 Cortical age-related cataract, left eye: Secondary | ICD-10-CM | POA: Diagnosis not present

## 2020-12-12 DIAGNOSIS — H52222 Regular astigmatism, left eye: Secondary | ICD-10-CM | POA: Diagnosis not present

## 2020-12-12 DIAGNOSIS — H25812 Combined forms of age-related cataract, left eye: Secondary | ICD-10-CM | POA: Diagnosis not present

## 2020-12-12 DIAGNOSIS — H2512 Age-related nuclear cataract, left eye: Secondary | ICD-10-CM | POA: Diagnosis not present

## 2020-12-15 DIAGNOSIS — G4733 Obstructive sleep apnea (adult) (pediatric): Secondary | ICD-10-CM | POA: Diagnosis not present

## 2020-12-16 ENCOUNTER — Other Ambulatory Visit: Payer: Self-pay | Admitting: Internal Medicine

## 2020-12-16 DIAGNOSIS — K2100 Gastro-esophageal reflux disease with esophagitis, without bleeding: Secondary | ICD-10-CM

## 2020-12-26 DIAGNOSIS — Z6831 Body mass index (BMI) 31.0-31.9, adult: Secondary | ICD-10-CM | POA: Diagnosis not present

## 2020-12-26 DIAGNOSIS — Z01419 Encounter for gynecological examination (general) (routine) without abnormal findings: Secondary | ICD-10-CM | POA: Diagnosis not present

## 2020-12-26 DIAGNOSIS — L900 Lichen sclerosus et atrophicus: Secondary | ICD-10-CM | POA: Diagnosis not present

## 2020-12-26 DIAGNOSIS — M8580 Other specified disorders of bone density and structure, unspecified site: Secondary | ICD-10-CM | POA: Diagnosis not present

## 2021-01-09 DIAGNOSIS — H2511 Age-related nuclear cataract, right eye: Secondary | ICD-10-CM | POA: Diagnosis not present

## 2021-01-09 DIAGNOSIS — H25811 Combined forms of age-related cataract, right eye: Secondary | ICD-10-CM | POA: Diagnosis not present

## 2021-02-07 DIAGNOSIS — Z961 Presence of intraocular lens: Secondary | ICD-10-CM | POA: Diagnosis not present

## 2021-02-11 DIAGNOSIS — G4733 Obstructive sleep apnea (adult) (pediatric): Secondary | ICD-10-CM | POA: Diagnosis not present

## 2021-02-15 ENCOUNTER — Other Ambulatory Visit: Payer: Self-pay

## 2021-02-17 ENCOUNTER — Ambulatory Visit: Payer: Medicare PPO | Admitting: Internal Medicine

## 2021-02-17 ENCOUNTER — Other Ambulatory Visit: Payer: Self-pay

## 2021-02-17 ENCOUNTER — Encounter: Payer: Self-pay | Admitting: Internal Medicine

## 2021-02-17 VITALS — BP 134/78 | HR 78 | Temp 98.4°F | Resp 16 | Ht 64.0 in | Wt 183.0 lb

## 2021-02-17 DIAGNOSIS — I10 Essential (primary) hypertension: Secondary | ICD-10-CM | POA: Diagnosis not present

## 2021-02-17 DIAGNOSIS — F5104 Psychophysiologic insomnia: Secondary | ICD-10-CM | POA: Diagnosis not present

## 2021-02-17 DIAGNOSIS — F418 Other specified anxiety disorders: Secondary | ICD-10-CM

## 2021-02-17 DIAGNOSIS — E785 Hyperlipidemia, unspecified: Secondary | ICD-10-CM | POA: Diagnosis not present

## 2021-02-17 DIAGNOSIS — D508 Other iron deficiency anemias: Secondary | ICD-10-CM

## 2021-02-17 DIAGNOSIS — K2100 Gastro-esophageal reflux disease with esophagitis, without bleeding: Secondary | ICD-10-CM | POA: Diagnosis not present

## 2021-02-17 DIAGNOSIS — G629 Polyneuropathy, unspecified: Secondary | ICD-10-CM | POA: Diagnosis not present

## 2021-02-17 LAB — CBC WITH DIFFERENTIAL/PLATELET
Basophils Absolute: 0 10*3/uL (ref 0.0–0.1)
Basophils Relative: 0.8 % (ref 0.0–3.0)
Eosinophils Absolute: 0.1 10*3/uL (ref 0.0–0.7)
Eosinophils Relative: 2.4 % (ref 0.0–5.0)
HCT: 39.4 % (ref 36.0–46.0)
Hemoglobin: 13.4 g/dL (ref 12.0–15.0)
Lymphocytes Relative: 31.8 % (ref 12.0–46.0)
Lymphs Abs: 1.6 10*3/uL (ref 0.7–4.0)
MCHC: 34 g/dL (ref 30.0–36.0)
MCV: 92.9 fl (ref 78.0–100.0)
Monocytes Absolute: 0.4 10*3/uL (ref 0.1–1.0)
Monocytes Relative: 8.2 % (ref 3.0–12.0)
Neutro Abs: 2.9 10*3/uL (ref 1.4–7.7)
Neutrophils Relative %: 56.8 % (ref 43.0–77.0)
Platelets: 258 10*3/uL (ref 150.0–400.0)
RBC: 4.24 Mil/uL (ref 3.87–5.11)
RDW: 13.1 % (ref 11.5–15.5)
WBC: 5.1 10*3/uL (ref 4.0–10.5)

## 2021-02-17 LAB — BASIC METABOLIC PANEL
BUN: 22 mg/dL (ref 6–23)
CO2: 27 mEq/L (ref 19–32)
Calcium: 9.5 mg/dL (ref 8.4–10.5)
Chloride: 108 mEq/L (ref 96–112)
Creatinine, Ser: 0.75 mg/dL (ref 0.40–1.20)
GFR: 79.56 mL/min (ref >60.00)
Glucose, Bld: 96 mg/dL (ref 70–99)
Potassium: 4.1 mEq/L (ref 3.5–5.1)
Sodium: 143 mEq/L (ref 135–145)

## 2021-02-17 LAB — VITAMIN B12: Vitamin B-12: 336 pg/mL (ref 211–911)

## 2021-02-17 LAB — FOLATE: Folate: >24.4 ng/mL (ref >5.9)

## 2021-02-17 LAB — CK: Total CK: 38 U/L (ref 7–177)

## 2021-02-17 MED ORDER — FAMOTIDINE 40 MG PO TABS: 40.0000 mg | ORAL_TABLET | Freq: Every day | ORAL | 1 refills | Status: DC

## 2021-02-17 MED ORDER — ALPRAZOLAM 0.25 MG PO TABS: 0.2500 mg | ORAL_TABLET | Freq: Two times a day (BID) | ORAL | 2 refills | Status: DC | PRN

## 2021-02-17 MED ORDER — BELSOMRA 15 MG PO TABS: 1.0000 | ORAL_TABLET | Freq: Every evening | ORAL | 2 refills | Status: DC | PRN

## 2021-02-17 NOTE — Patient Instructions (Signed)
Health Maintenance, Female Adopting a healthy lifestyle and getting preventive care are important in promoting health and wellness. Ask your health care provider about: The right schedule for you to have regular tests and exams. Things you can do on your own to prevent diseases and keep yourself healthy. What should I know about diet, weight, and exercise? Eat a healthy diet  Eat a diet that includes plenty of vegetables, fruits, low-fat dairy products, and lean protein. Do not eat a lot of foods that are high in solid fats, added sugars, or sodium.  Maintain a healthy weight Body mass index (BMI) is used to identify weight problems. It estimates body fat based on height and weight. Your health care provider can help determineyour BMI and help you achieve or maintain a healthy weight. Get regular exercise Get regular exercise. This is one of the most important things you can do for your health. Most adults should: Exercise for at least 150 minutes each week. The exercise should increase your heart rate and make you sweat (moderate-intensity exercise). Do strengthening exercises at least twice a week. This is in addition to the moderate-intensity exercise. Spend less time sitting. Even light physical activity can be beneficial. Watch cholesterol and blood lipids Have your blood tested for lipids and cholesterol at 72 years of age, then havethis test every 5 years. Have your cholesterol levels checked more often if: Your lipid or cholesterol levels are high. You are older than 72 years of age. You are at high risk for heart disease. What should I know about cancer screening? Depending on your health history and family history, you may need to have cancer screening at various ages. This may include screening for: Breast cancer. Cervical cancer. Colorectal cancer. Skin cancer. Lung cancer. What should I know about heart disease, diabetes, and high blood pressure? Blood pressure and heart  disease High blood pressure causes heart disease and increases the risk of stroke. This is more likely to develop in people who have high blood pressure readings, are of African descent, or are overweight. Have your blood pressure checked: Every 3-5 years if you are 18-39 years of age. Every year if you are 40 years old or older. Diabetes Have regular diabetes screenings. This checks your fasting blood sugar level. Have the screening done: Once every three years after age 40 if you are at a normal weight and have a low risk for diabetes. More often and at a younger age if you are overweight or have a high risk for diabetes. What should I know about preventing infection? Hepatitis B If you have a higher risk for hepatitis B, you should be screened for this virus. Talk with your health care provider to find out if you are at risk forhepatitis B infection. Hepatitis C Testing is recommended for: Everyone born from 1945 through 1965. Anyone with known risk factors for hepatitis C. Sexually transmitted infections (STIs) Get screened for STIs, including gonorrhea and chlamydia, if: You are sexually active and are younger than 72 years of age. You are older than 72 years of age and your health care provider tells you that you are at risk for this type of infection. Your sexual activity has changed since you were last screened, and you are at increased risk for chlamydia or gonorrhea. Ask your health care provider if you are at risk. Ask your health care provider about whether you are at high risk for HIV. Your health care provider may recommend a prescription medicine to help   prevent HIV infection. If you choose to take medicine to prevent HIV, you should first get tested for HIV. You should then be tested every 3 months for as long as you are taking the medicine. Pregnancy If you are about to stop having your period (premenopausal) and you may become pregnant, seek counseling before you get  pregnant. Take 400 to 800 micrograms (mcg) of folic acid every day if you become pregnant. Ask for birth control (contraception) if you want to prevent pregnancy. Osteoporosis and menopause Osteoporosis is a disease in which the bones lose minerals and strength with aging. This can result in bone fractures. If you are 65 years old or older, or if you are at risk for osteoporosis and fractures, ask your health care provider if you should: Be screened for bone loss. Take a calcium or vitamin D supplement to lower your risk of fractures. Be given hormone replacement therapy (HRT) to treat symptoms of menopause. Follow these instructions at home: Lifestyle Do not use any products that contain nicotine or tobacco, such as cigarettes, e-cigarettes, and chewing tobacco. If you need help quitting, ask your health care provider. Do not use street drugs. Do not share needles. Ask your health care provider for help if you need support or information about quitting drugs. Alcohol use Do not drink alcohol if: Your health care provider tells you not to drink. You are pregnant, may be pregnant, or are planning to become pregnant. If you drink alcohol: Limit how much you use to 0-1 drink a day. Limit intake if you are breastfeeding. Be aware of how much alcohol is in your drink. In the [place] one drink equals one 12 oz bottle of beer (355 mL), one 5 oz glass of wine (148 mL), or one 1 oz glass of hard liquor (44 mL). General instructions Schedule regular health, dental, and eye exams. Stay current with your vaccines. Tell your health care provider if: You often feel depressed. You have ever been abused or do not feel safe at home. Summary Adopting a healthy lifestyle and getting preventive care are important in promoting health and wellness. Follow your health care provider's instructions about healthy diet, exercising, and getting tested or screened for diseases. Follow your health care provider's  instructions on monitoring your cholesterol and blood pressure. This information is not intended to replace advice given to you by your health care provider. Make sure you discuss any questions you have with your healthcare provider. Document Revised: 08/17/2018 Document Reviewed: 08/17/2018 Elsevier Patient Education  2022 Elsevier Inc.  

## 2021-02-17 NOTE — Progress Notes (Signed)
Subjective:  Patient ID: Kayla Khan, female    DOB: [birth year]  Age: 70 y.o. MRN: [id number]  CC: Anemia, Hypertension, and Hyperlipidemia  This visit occurred during the SARS-CoV-2 public health emergency.  Safety protocols were in place, including screening questions prior to the visit, additional usage of staff PPE, and extensive cleaning of exam room while observing appropriate contact time as indicated for disinfecting solutions.    HPI Kayla Khan presents for f/up -   She has had a burning sensation in her feet for 20 years.  For the last few months the burning sensation has extended up into her hands.  The sensation is bilateral and symmetrical.  She also has mild tingling but no numbness, weakness, or incoordination.  She works out on elliptical for about 30 minutes every few days and does not experience CP, DOE, palpitations, edema, or fatigue.  Outpatient Medications Prior to Visit  Medication Sig Dispense Refill   amLODipine (NORVASC) 5 MG tablet TAKE 1 TABLET(5 MG) BY MOUTH DAILY 90 tablet 1   Arginine 1000 MG TABS Take 3,000 mg by mouth in the morning and at bedtime.     Ascorbic Acid (VITAMIN C) 1000 MG tablet Take 1,000 mg by mouth daily.     aspirin EC 81 MG tablet Take 81 mg by mouth daily.     Cholecalciferol (VITAMIN D) 50 MCG (2000 UT) tablet Take 2,000 Units by mouth daily.     clobetasol ointment (TEMOVATE) 4.76 % Apply 1 application topically 2 (two) times daily. 30 g 2   co-enzyme Q-10 30 MG capsule Take 100 mg by mouth daily.     conjugated estrogens (PREMARIN) vaginal cream Place 1 Applicatorful vaginally 2 (two) times a week.      fish oil-omega-3 fatty acids 1000 MG capsule Take 3 g by mouth daily.     nystatin-triamcinolone ointment (MYCOLOG) Apply 1 application topically as needed (yeast infection).     pravastatin (PRAVACHOL) 40 MG tablet Take 1 tablet (40 mg total) by mouth every evening. 90 tablet 3   telmisartan (MICARDIS) 80 MG tablet Take 1  tablet (80 mg total) by mouth daily. 90 tablet 3   ALPRAZolam (XANAX) 0.25 MG tablet Take 1 tablet (0.25 mg total) by mouth 2 (two) times daily as needed. 35 tablet 2   Suvorexant (BELSOMRA) 15 MG TABS Take 1 tablet by mouth at bedtime as needed. 30 tablet 2   No facility-administered medications prior to visit.    ROS Review of Systems  Constitutional:  Negative for appetite change, diaphoresis, fatigue and unexpected weight change.  HENT: Negative.    Eyes: Negative.   Respiratory:  Negative for cough, chest tightness, shortness of breath and wheezing.   Cardiovascular:  Negative for chest pain, palpitations and leg swelling.  Gastrointestinal:  Negative for abdominal pain, constipation, diarrhea, nausea and vomiting.  Endocrine: Negative.   Genitourinary: Negative.  Negative for difficulty urinating.  Musculoskeletal:  Positive for arthralgias and back pain. Negative for myalgias.  Skin: Negative.  Negative for color change.  Neurological: Negative.  Negative for dizziness, weakness, light-headedness and numbness.  Hematological:  Negative for adenopathy. Does not bruise/bleed easily.  Psychiatric/Behavioral:  Positive for sleep disturbance. Negative for decreased concentration, dysphoric mood and suicidal ideas. The patient is nervous/anxious.    Objective:  BP 134/78 (BP Location: Left Arm, Patient Position: Sitting, Cuff Size: Large)   Pulse 78   Temp 98.4 F (36.9 C) (Oral)   Resp 16   Ht 5'  4" (1.626 m)   Wt 183 lb (83 kg)   SpO2 97%   BMI 31.41 kg/m   BP Readings from Last 3 Encounters:  02/17/21 134/78  11/22/20 124/70  10/11/20 111/64    Wt Readings from Last 3 Encounters:  02/17/21 183 lb (83 kg)  11/22/20 182 lb 9.6 oz (82.8 kg)  10/11/20 176 lb (79.8 kg)    Physical Exam Vitals reviewed.  Constitutional:      Appearance: She is not ill-appearing.  HENT:     Nose: Nose normal.     Mouth/Throat:     Mouth: Mucous membranes are moist.  Eyes:      General: No scleral icterus.    Conjunctiva/sclera: Conjunctivae normal.  Cardiovascular:     Rate and Rhythm: Normal rate and regular rhythm.     Heart sounds: No murmur heard. Pulmonary:     Effort: Pulmonary effort is normal.     Breath sounds: No stridor. No wheezing, rhonchi or rales.  Abdominal:     General: Abdomen is protuberant. Bowel sounds are normal. There is no distension.     Palpations: Abdomen is soft. There is no hepatomegaly, splenomegaly or mass.     Tenderness: There is no abdominal tenderness.  Musculoskeletal:        General: Normal range of motion.     Cervical back: Neck supple.     Right lower leg: No edema.     Left lower leg: No edema.  Lymphadenopathy:     Cervical: No cervical adenopathy.  Skin:    General: Skin is warm and dry.  Neurological:     General: No focal deficit present.     Mental Status: She is alert.     Motor: No weakness.     Gait: Gait normal.     Deep Tendon Reflexes: Reflexes normal.  Psychiatric:        Mood and Affect: Mood normal.        Behavior: Behavior normal.    Lab Results  Component Value Date   WBC 5.1 02/17/2021   HGB 13.4 02/17/2021   HCT 39.4 02/17/2021   PLT 258.0 02/17/2021   GLUCOSE 96 02/17/2021   CHOL 136 07/19/2020   TRIG 67 07/19/2020   HDL 51 07/19/2020   LDLDIRECT 92.0 12/15/2016   LDLCALC 71 07/19/2020   ALT 18 07/19/2020   AST 17 07/19/2020   NA 143 02/17/2021   K 4.1 02/17/2021   CL 108 02/17/2021   CREATININE 0.75 02/17/2021   BUN 22 02/17/2021   CO2 27 02/17/2021   TSH 1.26 05/15/2020   HGBA1C 5.8 02/06/2020    CT CORONARY MORPH W/CTA COR W/SCORE W/CA W/CM &/OR WO/CM  Addendum Date: 03/26/2020   ADDENDUM REPORT: 03/26/2020 14:24 CLINICAL DATA:  70 year old female with h/o hypertension, hyperlipidemia and exertional dyspnea. EXAM: Cardiac/Coronary  CTA TECHNIQUE: The patient was scanned on a [employer]. FINDINGS: A 100 kV prospective scan was triggered in the descending  thoracic aorta at 111 HU's. Axial non-contrast 3 mm slices were carried out through the heart. The data set was analyzed on a dedicated work station and scored using the [place]. Gantry rotation speed was 250 msecs and collimation was .6 mm. 10 mg of PO Ivabradine and 0.8 mg of sl NTG was given. The 3D data set was reconstructed in 5% intervals of the 67-82 % of the R-R cycle. Diastolic phases were analyzed on a dedicated work station using MPR, MIP and VRT modes. The  patient received 80 cc of contrast. Aorta: Normal size. Mild diffuse atherosclerotic plaque and calcifications. No dissection. Aortic Valve:  Trileaflet.  No calcifications. Coronary Arteries:  Normal coronary origin.  Right dominance. RCA is a large dominant artery that gives rise to PDA and PLA. There is minimal calcified plaque in the ostial portion with stenosis 0-25%, mid and distal RCA have no significant plaque. Left main is a large artery that gives rise to LAD and LCX arteries. Left main has no plaque. LAD is a medium caliber vessel that gives rise to one diagonal artery. There is minimal calcified plaque in the proximal portion with stenosis 0-25%. LCX is a non-dominant artery that gives rise to one large OM1 branch. There is no significant plaque. Other findings: Normal pulmonary vein drainage into the left atrium. Normal left atrial appendage without a thrombus. Normal size of the pulmonary artery. IMPRESSION: 1. Coronary calcium score of 6.2. This was 110 percentile for age and sex matched control. 2. Normal coronary origin with right dominance. 3. CAD-RADS 1. Minimal non-obstructive CAD (0-24%). Consider non-atherosclerotic causes of chest pain. Consider preventive therapy and risk factor modification. Electronically Signed   By: Ena Dawley   On: 03/26/2020 14:24   Result Date: 03/26/2020 EXAM: OVER-READ INTERPRETATION  CT CHEST The following report is an over-read performed by radiologist Dr. Vinnie Langton of [facility], [place] on 03/25/2020. This over-read does not include interpretation of cardiac or coronary anatomy or pathology. The coronary calcium score/coronary CTA interpretation by the cardiologist is attached. COMPARISON:  None. FINDINGS: Aortic atherosclerosis. Within the visualized portions of the thorax there are no suspicious appearing pulmonary nodules or masses, there is no acute consolidative airspace disease, no pleural effusions, no pneumothorax and no lymphadenopathy. Visualized portions of the upper abdomen are unremarkable. There are no aggressive appearing lytic or blastic lesions noted in the visualized portions of the skeleton. IMPRESSION: 1.  Aortic Atherosclerosis (ICD10-I70.0). Electronically Signed: By: Vinnie Langton M.D. On: 03/25/2020 10:29    Assessment & Plan:   Doaa was seen today for anemia, hypertension and hyperlipidemia.  Diagnoses and all orders for this visit:  Essential hypertension- Her blood pressure is adequately well controlled. -     Basic metabolic panel; Future -     Basic metabolic panel  Iron deficiency anemia secondary to inadequate dietary iron intake- Her H&H are normal now. -     CBC with Differential/Platelet; Future -     CBC with Differential/Platelet  Depression with anxiety -     ALPRAZolam (XANAX) 0.25 MG tablet; Take 1 tablet (0.25 mg total) by mouth 2 (two) times daily as needed.  Psychophysiological insomnia -     Suvorexant (BELSOMRA) 15 MG TABS; Take 1 tablet by mouth at bedtime as needed.  Hyperlipidemia with target LDL less than 130- CPK is reassuring that she is not experiencing a myopathy. -     CK; Future -     CK  Neuropathy- I have asked her to see neurology about this. -     Ambulatory referral to Neurology -     CBC with Differential/Platelet; Future -     Folate; Future -     Vitamin B12; Future -     Vitamin B12 -     Folate -     CBC with Differential/Platelet  Gastroesophageal reflux disease with esophagitis  without hemorrhage- Her symptoms are well controlled with the H2 blocker. -     famotidine (PEPCID) 40 MG tablet; Take 1  tablet (40 mg total) by mouth at bedtime.  I am having Kenecia L. Formica start on famotidine. I am also having her maintain her fish oil-omega-3 fatty acids, conjugated estrogens, aspirin EC, co-enzyme Q-10, vitamin C, clobetasol ointment, Vitamin D, nystatin-triamcinolone ointment, telmisartan, pravastatin, Arginine, amLODipine, ALPRAZolam, and Belsomra.  Meds ordered this encounter  Medications   ALPRAZolam (XANAX) 0.25 MG tablet    Sig: Take 1 tablet (0.25 mg total) by mouth 2 (two) times daily as needed.    Dispense:  35 tablet    Refill:  2   Suvorexant (BELSOMRA) 15 MG TABS    Sig: Take 1 tablet by mouth at bedtime as needed.    Dispense:  30 tablet    Refill:  2   famotidine (PEPCID) 40 MG tablet    Sig: Take 1 tablet (40 mg total) by mouth at bedtime.    Dispense:  90 tablet    Refill:  1     Follow-up: Return in about 6 months (around 08/19/2021).  Scarlette Calico, MD

## 2021-03-04 DIAGNOSIS — H26492 Other secondary cataract, left eye: Secondary | ICD-10-CM | POA: Diagnosis not present

## 2021-03-13 DIAGNOSIS — Z1231 Encounter for screening mammogram for malignant neoplasm of breast: Secondary | ICD-10-CM | POA: Diagnosis not present

## 2021-03-13 LAB — HM MAMMOGRAPHY

## 2021-03-19 ENCOUNTER — Encounter: Payer: Self-pay | Admitting: Internal Medicine

## 2021-03-28 ENCOUNTER — Encounter (HOSPITAL_BASED_OUTPATIENT_CLINIC_OR_DEPARTMENT_OTHER): Payer: Self-pay

## 2021-03-28 ENCOUNTER — Emergency Department (HOSPITAL_BASED_OUTPATIENT_CLINIC_OR_DEPARTMENT_OTHER)
Admission: EM | Admit: 2021-03-28 | Discharge: 2021-03-28 | Disposition: A | Discharge status: Home / Self Care | Payer: Medicare PPO | Attending: Emergency Medicine | Admitting: Emergency Medicine

## 2021-03-28 ENCOUNTER — Other Ambulatory Visit: Payer: Self-pay

## 2021-03-28 ENCOUNTER — Emergency Department (HOSPITAL_BASED_OUTPATIENT_CLINIC_OR_DEPARTMENT_OTHER): Payer: Medicare PPO

## 2021-03-28 DIAGNOSIS — I10 Essential (primary) hypertension: Secondary | ICD-10-CM | POA: Diagnosis not present

## 2021-03-28 DIAGNOSIS — Z91040 Latex allergy status: Secondary | ICD-10-CM | POA: Insufficient documentation

## 2021-03-28 DIAGNOSIS — Z7982 Long term (current) use of aspirin: Secondary | ICD-10-CM | POA: Diagnosis not present

## 2021-03-28 DIAGNOSIS — S6992XA Unspecified injury of left wrist, hand and finger(s), initial encounter: Secondary | ICD-10-CM | POA: Diagnosis not present

## 2021-03-28 DIAGNOSIS — S60922A Unspecified superficial injury of left hand, initial encounter: Secondary | ICD-10-CM | POA: Diagnosis present

## 2021-03-28 DIAGNOSIS — J45909 Unspecified asthma, uncomplicated: Secondary | ICD-10-CM | POA: Insufficient documentation

## 2021-03-28 DIAGNOSIS — S60222A Contusion of left hand, initial encounter: Secondary | ICD-10-CM

## 2021-03-28 DIAGNOSIS — Z79899 Other long term (current) drug therapy: Secondary | ICD-10-CM | POA: Diagnosis not present

## 2021-03-28 DIAGNOSIS — Y92410 Unspecified street and highway as the place of occurrence of the external cause: Secondary | ICD-10-CM | POA: Diagnosis not present
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
## 2021-03-28 NOTE — Discharge Instructions (Addendum)
Apply ice to help with the swelling.  Take over-the-counter medications as needed for pain.  Follow-up with your doctor or an orthopedic doctor if the symptoms do not improve in the next week

## 2021-04-07 ENCOUNTER — Encounter: Payer: Self-pay | Admitting: Internal Medicine

## 2021-04-07 ENCOUNTER — Other Ambulatory Visit: Payer: Self-pay

## 2021-04-07 ENCOUNTER — Ambulatory Visit: Payer: Medicare PPO | Admitting: Internal Medicine

## 2021-04-07 VITALS — BP 142/82 | HR 85 | Temp 98.2°F | Wt 183.0 lb

## 2021-04-07 DIAGNOSIS — F418 Other specified anxiety disorders: Secondary | ICD-10-CM

## 2021-04-07 DIAGNOSIS — I10 Essential (primary) hypertension: Secondary | ICD-10-CM

## 2021-04-07 DIAGNOSIS — F411 Generalized anxiety disorder: Secondary | ICD-10-CM | POA: Diagnosis not present

## 2021-04-07 MED ORDER — PAROXETINE HCL ER 12.5 MG PO TB24: 12.5000 mg | ORAL_TABLET | Freq: Every day | ORAL | 0 refills | Status: DC

## 2021-04-07 NOTE — Patient Instructions (Signed)
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
## 2021-04-09 ENCOUNTER — Other Ambulatory Visit: Payer: Self-pay

## 2021-04-09 ENCOUNTER — Encounter: Payer: Self-pay | Admitting: Physician Assistant

## 2021-04-09 ENCOUNTER — Ambulatory Visit: Payer: Medicare PPO | Admitting: Physician Assistant

## 2021-04-09 VITALS — BP 126/78 | HR 81 | Ht 64.0 in | Wt 181.8 lb

## 2021-04-09 DIAGNOSIS — I2510 Atherosclerotic heart disease of native coronary artery without angina pectoris: Secondary | ICD-10-CM

## 2021-04-09 DIAGNOSIS — E785 Hyperlipidemia, unspecified: Secondary | ICD-10-CM

## 2021-04-09 DIAGNOSIS — I10 Essential (primary) hypertension: Secondary | ICD-10-CM

## 2021-04-09 NOTE — Patient Instructions (Addendum)
Medication Instructions:  Your physician recommends that you continue on your current medications as directed. Please refer to the Current Medication list given to you today.  *If you need a refill on your cardiac medications before your next appointment, please call your pharmacy*   Lab Work: 07/21/2021:  COME FOR FASTING LABS.  NOTHING TO EAT OR DRINK AFTER MIDNIGHT THE NIGHT BEFORE.. YOU CAN COME ANYTIME FROM 7:30 A.M - 4:30  If you have labs (blood work) drawn today and your tests are completely normal, you will receive your results only by: [place] (if you have MyChart) OR A paper copy in the mail If you have any lab test that is abnormal or we need to change your treatment, we will call you to review the results.   Testing/Procedures: None ordered   Follow-Up: At [facility], you and your health needs are our priority.  As part of our continuing mission to provide you with exceptional heart care, we have created designated Provider Care Teams.  These Care Teams include your primary Cardiologist (physician) and Advanced Practice Providers (APPs -  Physician Assistants and Nurse Practitioners) who all work together to provide you with the care you need, when you need it.  We recommend signing up for the patient portal called "MyChart".  Sign up information is provided on this After Visit Summary.  MyChart is used to connect with patients for Virtual Visits (Telemedicine).  Patients are able to view lab/test results, encounter notes, upcoming appointments, etc.  Non-urgent messages can be sent to your provider as well.   To learn more about what you can do with MyChart, go to [email or web address].    Your next appointment:   12 month(s)  The format for your next appointment:   In Person  Provider:   You may see Ena Dawley, MD (Inactive) or one of the following Advanced Practice Providers on your designated Care Team:   Richardson Dopp, PA-C Robbie Lis,  [place]   Other Instructions

## 2021-04-17 ENCOUNTER — Other Ambulatory Visit: Payer: Self-pay | Admitting: Physician Assistant

## 2021-04-21 ENCOUNTER — Encounter: Payer: Self-pay | Admitting: Internal Medicine

## 2021-04-21 ENCOUNTER — Other Ambulatory Visit: Payer: Self-pay | Admitting: Internal Medicine

## 2021-04-21 DIAGNOSIS — F331 Major depressive disorder, recurrent, moderate: Secondary | ICD-10-CM

## 2021-04-21 DIAGNOSIS — F411 Generalized anxiety disorder: Secondary | ICD-10-CM

## 2021-04-21 MED ORDER — PAROXETINE HCL ER 25 MG PO TB24: 25.0000 mg | ORAL_TABLET | Freq: Every day | ORAL | 1 refills | Status: DC

## 2021-04-28 ENCOUNTER — Ambulatory Visit: Payer: Medicare PPO | Admitting: Internal Medicine

## 2021-04-28 ENCOUNTER — Other Ambulatory Visit: Payer: Self-pay

## 2021-04-28 ENCOUNTER — Encounter: Payer: Self-pay | Admitting: Internal Medicine

## 2021-04-28 VITALS — BP 158/82 | HR 99 | Temp 97.9°F | Resp 18 | Ht 64.0 in | Wt 180.2 lb

## 2021-04-28 DIAGNOSIS — R0602 Shortness of breath: Secondary | ICD-10-CM | POA: Insufficient documentation

## 2021-04-28 DIAGNOSIS — F411 Generalized anxiety disorder: Secondary | ICD-10-CM | POA: Diagnosis not present

## 2021-04-28 DIAGNOSIS — F331 Major depressive disorder, recurrent, moderate: Secondary | ICD-10-CM | POA: Diagnosis not present

## 2021-04-28 LAB — COMPREHENSIVE METABOLIC PANEL
ALT: 22 U/L (ref 0–35)
AST: 16 U/L (ref 0–37)
Albumin: 4.4 g/dL (ref 3.5–5.2)
Alkaline Phosphatase: 70 U/L (ref 39–117)
BUN: 25 mg/dL — ABNORMAL HIGH (ref 6–23)
CO2: 25 mEq/L (ref 19–32)
Calcium: 10.4 mg/dL (ref 8.4–10.5)
Chloride: 104 mEq/L (ref 96–112)
Creatinine, Ser: 0.95 mg/dL (ref 0.40–1.20)
GFR: 59.83 mL/min — ABNORMAL LOW (ref >60.00)
Glucose, Bld: 113 mg/dL — ABNORMAL HIGH (ref 70–99)
Potassium: 4.4 mEq/L (ref 3.5–5.1)
Sodium: 139 mEq/L (ref 135–145)
Total Bilirubin: 0.5 mg/dL (ref 0.2–1.2)
Total Protein: 7.8 g/dL (ref 6.0–8.3)

## 2021-04-28 LAB — BRAIN NATRIURETIC PEPTIDE: Pro B Natriuretic peptide (BNP): 15 pg/mL (ref 0.0–100.0)

## 2021-04-28 LAB — CBC
HCT: 39.8 % (ref 36.0–46.0)
Hemoglobin: 13.7 g/dL (ref 12.0–15.0)
MCHC: 34.5 g/dL (ref 30.0–36.0)
MCV: 92.8 fl (ref 78.0–100.0)
Platelets: 257 10*3/uL (ref 150.0–400.0)
RBC: 4.29 Mil/uL (ref 3.87–5.11)
RDW: 12.7 % (ref 11.5–15.5)
WBC: 6.6 10*3/uL (ref 4.0–10.5)

## 2021-04-28 LAB — TROPONIN I (HIGH SENSITIVITY): High Sens Troponin I: 4 ng/L (ref 2–17)

## 2021-04-28 MED ORDER — PAROXETINE HCL 10 MG/5ML PO SUSP: 7.0000 mg | ORAL | 0 refills | Status: DC

## 2021-04-28 MED ORDER — TELMISARTAN 80 MG PO TABS: ORAL_TABLET | ORAL | 3 refills | Status: DC

## 2021-04-28 MED ORDER — ONDANSETRON 4 MG PO TBDP: 4.0000 mg | ORAL_TABLET | Freq: Three times a day (TID) | ORAL | 0 refills | Status: DC | PRN

## 2021-04-28 NOTE — Progress Notes (Signed)
   Subjective:   Patient ID: Kayla Khan, female    DOB: 05/21/1951, 70 y.o.   MRN: [id number]  HPI The patient is a 70 YO female coming in for concerns with starting paxil. Started that about a month ago and since is having palpitations and high BP and racing heart. Some SOB on exertion which is new also. Taking 12.5 mg daily for 2-3 weeks. Having nausea and lost several pounds in the last week. She is getting dizziness in the last week. She denies chest pains. Some SOB at rest and exertion. Anxiety is improved.  Review of Systems  Constitutional:  Positive for activity change, appetite change and fatigue.  HENT: Negative.    Eyes: Negative.   Respiratory:  Positive for shortness of breath. Negative for cough and chest tightness.   Cardiovascular:  Negative for chest pain, palpitations and leg swelling.  Gastrointestinal:  Positive for nausea. Negative for abdominal distention, abdominal pain, constipation, diarrhea and vomiting.  Musculoskeletal: Negative.   Skin: Negative.   Neurological:  Positive for dizziness.  Psychiatric/Behavioral: Negative.     Objective:  Physical Exam Constitutional:      Appearance: She is well-developed. She is ill-appearing.  HENT:     Head: Normocephalic and atraumatic.  Cardiovascular:     Rate and Rhythm: Normal rate and regular rhythm.  Pulmonary:     Effort: Pulmonary effort is normal. No respiratory distress.     Breath sounds: Normal breath sounds. No wheezing or rales.     Comments: Appears SOB while talking, not significantly changed while walking. Better with mask off. Abdominal:     General: Bowel sounds are normal. There is no distension.     Palpations: Abdomen is soft.     Tenderness: There is no abdominal tenderness. There is no rebound.  Musculoskeletal:     Cervical back: Normal range of motion.  Skin:    General: Skin is warm and dry.  Neurological:     Mental Status: She is alert and oriented to person, place, and time.      Coordination: Coordination normal.     Comments: Some dizziness with standing clears within a minute    Vitals:   04/28/21 0850  BP: (!) 158/82  Pulse: 99  Resp: 18  Temp: 97.9 F (36.6 C)  TempSrc: Oral  SpO2: 96%  Weight: 180 lb 3.2 oz (81.7 kg)  Height: '5\' 4"'$  (1.626 m)   EKG: Rate 91, axis normal, interval normal, sinus, some st or t wave changes leads III, AVf which are not new, poor r wave progression not new, no significant change compared to prior 04/2021   This visit occurred during the SARS-CoV-2 public health emergency.  Safety protocols were in place, including screening questions prior to the visit, additional usage of staff PPE, and extensive cleaning of exam room while observing appropriate contact time as indicated for disinfecting solutions.   Assessment & Plan:

## 2021-04-28 NOTE — Assessment & Plan Note (Signed)
Checking CBC, CMP, BNP, troponin to rule out metabolic and ischemic cause. EKG done today which is not significantly changed from prior. Could be side effect to paxil. Have done rx for wean to off.

## 2021-04-28 NOTE — Assessment & Plan Note (Signed)
She will wean off paxil due to potential serious side effects of SOB on exertion, dizziness, nausea. Rx paxil liquid to do 7 mg daily for 3-4 days then stop as she did have moderate withdrawal symptoms when she took this same medication back in her 46s. Rx zofran for nausea.

## 2021-04-28 NOTE — Assessment & Plan Note (Signed)
She has had improvement which is significant with her anxiety but with significant side effects to paxil. She is currently taking 12.5 mg daily and took already day of visit. She is advised to reduce to 7 mg daily for 3-4 days (3.5 mL) and then stop. If she gets withdrawal symptoms we can do extended taper she will contact our office for dosing. Rx zofran for nausea and assessing for other causes of the SOB and dizziness through labs including CBC, CMP, BNP, troponin. EKG done in office which is not changed from before.

## 2021-04-28 NOTE — Patient Instructions (Addendum)
We will have you take the liquid paxil 3.5 mL daily for 3-4 days then stop.  Ask the pharmacist if you need a syringe to measure the liquid.  We have sent in the nausea medicine.

## 2021-04-30 ENCOUNTER — Ambulatory Visit: Payer: Medicare PPO | Admitting: Internal Medicine

## 2021-05-01 ENCOUNTER — Other Ambulatory Visit: Payer: Self-pay | Admitting: Internal Medicine

## 2021-05-01 DIAGNOSIS — I10 Essential (primary) hypertension: Secondary | ICD-10-CM

## 2021-05-01 DIAGNOSIS — I700 Atherosclerosis of aorta: Secondary | ICD-10-CM

## 2021-05-01 NOTE — Addendum Note (Signed)
Addended by: Terence Lux A on: 05/01/2021 01:41 PM   Modules accepted: Orders

## 2021-05-13 DIAGNOSIS — G4733 Obstructive sleep apnea (adult) (pediatric): Secondary | ICD-10-CM | POA: Diagnosis not present

## 2021-05-22 ENCOUNTER — Other Ambulatory Visit: Payer: Self-pay

## 2021-05-22 ENCOUNTER — Encounter: Payer: Self-pay | Admitting: Neurology

## 2021-05-22 ENCOUNTER — Ambulatory Visit: Payer: Medicare PPO | Admitting: Neurology

## 2021-05-22 VITALS — BP 127/63 | HR 78 | Ht 64.0 in | Wt 181.0 lb

## 2021-05-22 DIAGNOSIS — Z113 Encounter for screening for infections with a predominantly sexual mode of transmission: Secondary | ICD-10-CM | POA: Diagnosis not present

## 2021-05-22 DIAGNOSIS — R202 Paresthesia of skin: Secondary | ICD-10-CM | POA: Diagnosis not present

## 2021-05-22 NOTE — Progress Notes (Signed)
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 female   Intermittent bilateral upper and lower extremity paresthesia  Often improved by moving her neck, brisk reflex,  Differentiation diagnosis include cervical radiculopathy, also need to rule out peripheral neuropathy  Proceed with MRI of cervical spine  EMG nerve conduction study  Laboratory evaluation for treatable causes of peripheral neuropathy  DIAGNOSTIC DATA (LABS, IMAGING, TESTING) - I reviewed patient records, labs, notes, testing and imaging myself where available.  MRI Lumbar August 2016 L2-3 mild spinal stenosis has progressed since 2007   L3-4 moderate spinal stenosis has progressed since prior study   Moderate to severe spinal stenosis L4-5 has progressed. There is disc bulging and central disc protrusion. Expected impingement of the L5 nerve root bilaterally.   Mild spinal stenosis L5-S1 with mild impingement of the left S1 nerve root.  Laboratory evaluations in August 2022: Normal CBC, CMP, lipid profile, Z61 096, CK, folic acid,   MEDICAL HISTORY:  Kayla Khan, is a 70 year old female, seen in request by her primary care physician Dr. Harrison Mons, Arvid Right, elevation of bilateral upper and lower extremity paresthesia, initial evaluation was on May 22, 2021,  I reviewed and summarized the referring note. 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  PHYSICAL EXAMNIATION:  Gen: NAD, conversant, well nourised, well groomed                     Cardiovascular: Regular rate rhythm, no peripheral edema, warm, nontender. Eyes: Conjunctivae clear without exudates or hemorrhage Neck: Supple, no carotid bruits. Pulmonary: Clear to auscultation bilaterally   NEUROLOGICAL EXAM:  MENTAL STATUS: Speech:    Speech is normal; fluent and spontaneous with normal comprehension.  Cognition:     Orientation to time, place and person     Normal recent and remote memory     Normal Attention span and concentration     Normal Language, naming, repeating,spontaneous speech     Fund of knowledge   CRANIAL NERVES: CN II: Visual fields are full to confrontation. Pupils are round equal and briskly reactive to light. CN III, IV, VI: extraocular movement are normal. No ptosis. CN V: Facial sensation is intact to light touch CN VII: Face is symmetric with normal eye closure  CN VIII: Hearing is normal to causal conversation. CN IX, X: Phonation is normal. CN XI: Head turning and  shoulder shrug are intact  MOTOR: There is no pronator drift of out-stretched arms. Muscle bulk and tone are normal. Muscle strength is normal.  REFLEXES: Reflexes are 2+ and symmetric at the biceps, triceps, knees, and ankles. Plantar responses are flexor.  SENSORY: Slight length dependent light touch, pinprick and vibratory sensation in the middle of the foot,  COORDINATION: There is no  trunk or limb dysmetria noted.  GAIT/STANCE: Posture is normal. Gait is steady with normal steps, base, arm swing, and turning. Heel and toe walking are normal. Tandem gait is normal.  Romberg is absent.  REVIEW OF SYSTEMS:  Full 14 system review of systems performed and notable only for as above All other review of systems were negative.   ALLERGIES: Allergies  Allergen Reactions   Amoxicillin-Pot Clavulanate    Beta Adrenergic Blockers     Makes chest pain worse   Latex    Meperidine Hcl    Hctz [Hydrochlorothiazide] Nausea And Vomiting    HOME MEDICATIONS: Current Outpatient Medications  Medication Sig Dispense Refill   ALPRAZolam (XANAX) 0.25 MG tablet Take 1 tablet (0.25 mg total) by mouth 2 (two) times daily as needed. 35 tablet 2   amLODipine (NORVASC) 5 MG tablet TAKE 1 TABLET(5 MG) BY MOUTH DAILY 90 tablet 1   Arginine 1000 MG TABS Take 3,000 mg by mouth in the morning and at bedtime.     Ascorbic Acid (VITAMIN C) 1000 MG tablet Take 1,000 mg by mouth daily.     aspirin EC 81 MG tablet Take 81 mg by mouth daily.     Cholecalciferol (VITAMIN D) 50 MCG (2000 UT) tablet Take 2,000 Units by mouth daily.     clobetasol ointment (TEMOVATE) 8.29 % Apply 1 application topically 2 (two) times daily. 30 g 2   co-enzyme Q-10 30 MG capsule Take 100 mg by mouth daily.     conjugated estrogens (PREMARIN) vaginal cream Place 1 Applicatorful vaginally 2 (two) times a week.      famotidine (PEPCID) 40 MG tablet Take 1 tablet (40 mg total) by mouth at bedtime. 90 tablet 1   fish oil-omega-3 fatty acids 1000 MG capsule Take 3 g by mouth daily.     nystatin-triamcinolone ointment (MYCOLOG) Apply 1 application topically as needed (yeast infection).     pravastatin (PRAVACHOL) 40 MG tablet Take 1 tablet (40 mg total) by mouth every evening. 90 tablet 3   Suvorexant (BELSOMRA) 15 MG TABS Take 1 tablet by mouth at bedtime as needed. 30 tablet 2   telmisartan (MICARDIS) 80 MG tablet TAKE 1  TABLET(80 MG) BY MOUTH DAILY 90 tablet 3   No current facility-administered medications for this visit.    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 h/o such - cor CT 03/2020 with minimal CAD, consider noncardiac causes of pain.   Palpitations  Pre-diabetes    Sleep apnea    Thyroid nodule    The patient had a thyroid nodule that was surgically removed in the past.  She has one half of her thyroid and does not take replacement of Taxol and he was benign    PAST SURGICAL HISTORY: Past Surgical History:  Procedure Laterality Date   APPENDECTOMY     BREAST BIOPSY     CESAREAN SECTION     x2   GANGLION CYST EXCISION     THYROIDECTOMY, PARTIAL     left   TUBAL LIGATION     VESICOVAGINAL FISTULA CLOSURE W/ TAH      FAMILY HISTORY: Family History  Problem Relation Age of Onset   Cancer Mother    Hypertension Father    Aneurysm Father 63       cerebral hemorrhage   Diabetes Other    Heart disease Other    Cancer Other    Arthritis Other    Hypertension Other    Aneurysm Other    Heart attack Maternal Grandfather    Stroke Paternal Grandfather    Stroke Maternal Grandmother    Cancer Brother     SOCIAL HISTORY: Social History   Socioeconomic History   Marital status: Divorced    Spouse name: Not on file   Number of children: Not on file   Years of education: Not on file   Highest education level: Not on file   Occupational History   Occupation: Pharmacist, hospital  Tobacco Use   Smoking status: Never   Smokeless tobacco: Never  Substance and Sexual Activity   Alcohol use: No   Drug use: No   Sexual activity: Not Currently  Other Topics Concern   Not on file  Social History Narrative   Not on file   Social Determinants of Health   Financial Resource Strain: Low Risk    Difficulty of Paying Living Expenses: Not hard at all  Food Insecurity: No Food Insecurity   Worried About Charity fundraiser in the Last Year: Never true   Ran Out of Food in the Last Year: Never true  Transportation Needs: No Transportation Needs   Lack of Transportation (Medical): No   Lack of Transportation (Non-Medical): No  Physical Activity: Sufficiently Active   Days of Exercise per Week: 5 days   Minutes of Exercise per Session: 30 min  Stress: No Stress Concern Present   Feeling of Stress : Not at all  Social Connections: Moderately Integrated   Frequency of Communication with Friends and Family: More than three times a week   Frequency of Social Gatherings with Friends and Family: More than three times a week   Attends Religious Services: More than 4 times per year   Active Member of [employer] or Organizations: Yes   Attends Archivist Meetings: More than 4 times per year   Marital Status: Never married  Intimate Partner Violence: Not on file      Marcial Pacas, M.D. Ph.D.  [facility] [address], [place], [place] [address] Ph: [phone] Fax: [phone]  CC:  Janith Lima, MD [place],  [place] [address]  Janith Lima, MD

## 2021-05-26 ENCOUNTER — Telehealth: Payer: Self-pay | Admitting: Neurology

## 2021-05-26 NOTE — Telephone Encounter (Signed)
Kayla Khan Kayla Khan: [id number] (exp. 05/23/21 to 06/25/21) order sent to GI. They will reach out to the patient to schedule.

## 2021-05-27 LAB — MULTIPLE MYELOMA PANEL, SERUM
Albumin SerPl Elph-Mcnc: 3.9 g/dL (ref 2.9–4.4)
Albumin/Glob SerPl: 1.4 (ref 0.7–1.7)
Alpha 1: 0.2 g/dL (ref 0.0–0.4)
Alpha2 Glob SerPl Elph-Mcnc: 0.6 g/dL (ref 0.4–1.0)
B-Globulin SerPl Elph-Mcnc: 1.1 g/dL (ref 0.7–1.3)
Gamma Glob SerPl Elph-Mcnc: 1.1 g/dL (ref 0.4–1.8)
Globulin, Total: 3 g/dL (ref 2.2–3.9)
IgA/Immunoglobulin A, Serum: 492 mg/dL — ABNORMAL HIGH (ref 64–422)
IgG (Immunoglobin G), Serum: 1089 mg/dL (ref 586–1602)
IgM (Immunoglobulin M), Srm: 16 mg/dL — ABNORMAL LOW (ref 26–217)
Total Protein: 6.9 g/dL (ref 6.0–8.5)

## 2021-05-27 LAB — TSH: TSH: 1.84 u[IU]/mL (ref 0.450–4.500)

## 2021-05-27 LAB — RPR: RPR Ser Ql: NONREACTIVE

## 2021-05-27 LAB — HGB A1C W/O EAG: Hgb A1c MFr Bld: 5.8 % — ABNORMAL HIGH (ref 4.8–5.6)

## 2021-05-27 LAB — SEDIMENTATION RATE: Sed Rate: 12 mm/hr (ref 0–40)

## 2021-05-27 LAB — C-REACTIVE PROTEIN: CRP: 4 mg/L (ref 0–10)

## 2021-05-27 LAB — ANA W/REFLEX IF POSITIVE: Anti Nuclear Antibody (ANA): NEGATIVE

## 2021-06-05 ENCOUNTER — Ambulatory Visit
Admission: RE | Admit: 2021-06-05 | Discharge: 2021-06-05 | Disposition: A | Discharge status: Home / Self Care | Payer: Medicare PPO | Source: Ambulatory Visit | Attending: Neurology | Admitting: Neurology

## 2021-06-05 ENCOUNTER — Other Ambulatory Visit: Payer: Self-pay

## 2021-06-05 DIAGNOSIS — R202 Paresthesia of skin: Secondary | ICD-10-CM

## 2021-06-26 ENCOUNTER — Other Ambulatory Visit: Payer: Self-pay

## 2021-06-26 ENCOUNTER — Ambulatory Visit: Payer: Medicare PPO | Admitting: Internal Medicine

## 2021-06-26 ENCOUNTER — Encounter: Payer: Self-pay | Admitting: Internal Medicine

## 2021-06-26 VITALS — BP 128/80 | HR 94 | Resp 18 | Ht 64.0 in | Wt 179.8 lb

## 2021-06-26 DIAGNOSIS — R29818 Other symptoms and signs involving the nervous system: Secondary | ICD-10-CM | POA: Diagnosis not present

## 2021-06-26 DIAGNOSIS — R42 Dizziness and giddiness: Secondary | ICD-10-CM | POA: Diagnosis not present

## 2021-06-26 MED ORDER — AZITHROMYCIN 250 MG PO TABS: ORAL_TABLET | ORAL | 0 refills | Status: AC

## 2021-06-26 NOTE — Assessment & Plan Note (Addendum)
New serious and debilitating problem. No signs of ear infection today. Concerning for stroke and ordered CT head stat today. Some sinus pressure will treat for this with azithromycin. 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

## 2021-06-26 NOTE — Patient Instructions (Signed)
We will get the CT scan of the head done.  We have sent in azithromycin take 2 pills today. Then starting tomorrow take 1 pill daily until gone.

## 2021-06-26 NOTE — Progress Notes (Signed)
   Subjective:   Patient ID: Kayla Khan, female    DOB: 11-13-1950, 70 y.o.   MRN: [id number]  Dizziness Pertinent negatives include no abdominal pain, chest pain, coughing, nausea or vomiting.  The patient is a 70 YO female coming in for new dizziness. Started Sunday night and persistent since then. 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 More problematic with moving head. Some nausea no vomiting. No weakness or numbness. Some speech changes not new recently. Has not tried anything for it.  Review of Systems  Constitutional: Negative.   HENT: Negative.    Eyes: Negative.   Respiratory:  Negative for cough, chest tightness and shortness of breath.   Cardiovascular:  Negative for chest pain, palpitations and leg swelling.  Gastrointestinal:  Negative for abdominal distention, abdominal pain, constipation, diarrhea, nausea and vomiting.  Musculoskeletal: Negative.   Skin: Negative.   Neurological:  Positive for dizziness.  Psychiatric/Behavioral: Negative.     Objective:  Physical Exam Constitutional:      Appearance: She is well-developed.  HENT:     Head: Normocephalic and atraumatic.  Cardiovascular:     Rate and Rhythm: Normal rate and regular rhythm.  Pulmonary:     Effort: Pulmonary effort is normal. No respiratory distress.     Breath sounds: Normal breath sounds. No wheezing or rales.  Abdominal:     General: Bowel sounds are normal. There is no distension.     Palpations: Abdomen is soft.     Tenderness: There is no abdominal tenderness. There is no rebound.  Musculoskeletal:     Cervical back: Normal range of motion.  Skin:    General: Skin is warm and dry.  Neurological:     Mental Status: She is alert and oriented to person, place, and time.     Coordination: Coordination abnormal.     Comments: Unstable on feet    Vitals:   06/26/21 1058  BP: 128/80  Pulse: 94  Resp: 18  SpO2: 95%  Weight: 179 lb 12.8 oz (81.6 kg)  Height: 5\' 4"  (1.626 m)     This visit occurred during the SARS-CoV-2 public health emergency.  Safety protocols were in place, including screening questions prior to the visit, additional usage of staff PPE, and extensive cleaning of exam room while observing appropriate contact time as indicated for disinfecting solutions.   Assessment & Plan:

## 2021-06-26 NOTE — Assessment & Plan Note (Signed)
Needs stat CT head today to rule out stroke as cause of new persistent dizziness.

## 2021-06-30 ENCOUNTER — Encounter: Payer: Self-pay | Admitting: Internal Medicine

## 2021-06-30 ENCOUNTER — Ambulatory Visit
Admission: RE | Admit: 2021-06-30 | Discharge: 2021-06-30 | Disposition: A | Discharge status: Home / Self Care | Payer: Medicare PPO | Source: Ambulatory Visit | Attending: Internal Medicine | Admitting: Internal Medicine

## 2021-06-30 DIAGNOSIS — R42 Dizziness and giddiness: Secondary | ICD-10-CM | POA: Diagnosis not present

## 2021-06-30 DIAGNOSIS — R29818 Other symptoms and signs involving the nervous system: Secondary | ICD-10-CM | POA: Diagnosis not present

## 2021-07-06 ENCOUNTER — Other Ambulatory Visit: Payer: Self-pay | Admitting: Internal Medicine

## 2021-07-06 DIAGNOSIS — F411 Generalized anxiety disorder: Secondary | ICD-10-CM

## 2021-07-06 DIAGNOSIS — F418 Other specified anxiety disorders: Secondary | ICD-10-CM

## 2021-07-07 ENCOUNTER — Other Ambulatory Visit: Payer: Self-pay | Admitting: Internal Medicine

## 2021-07-07 DIAGNOSIS — F411 Generalized anxiety disorder: Secondary | ICD-10-CM

## 2021-07-07 DIAGNOSIS — F331 Major depressive disorder, recurrent, moderate: Secondary | ICD-10-CM

## 2021-07-07 MED ORDER — PAROXETINE HCL ER 25 MG PO TB24
25.0000 mg | ORAL_TABLET | Freq: Every day | ORAL | 1 refills | Status: DC
Start: 2021-07-07 — End: 2021-07-08

## 2021-07-08 ENCOUNTER — Ambulatory Visit: Payer: Medicare PPO | Admitting: Internal Medicine

## 2021-07-08 ENCOUNTER — Encounter: Payer: Self-pay | Admitting: Internal Medicine

## 2021-07-08 ENCOUNTER — Other Ambulatory Visit: Payer: Self-pay

## 2021-07-08 DIAGNOSIS — R42 Dizziness and giddiness: Secondary | ICD-10-CM

## 2021-07-08 DIAGNOSIS — I10 Essential (primary) hypertension: Secondary | ICD-10-CM | POA: Diagnosis not present

## 2021-07-08 NOTE — Progress Notes (Signed)
   Subjective:   Patient ID: Kayla Khan, female    DOB: [birth year], 70 y.o.   MRN: [id number]  HPI The patient is a 70 YO female coming in for concerns about dizziness and feeling unwell. Referred for vestibular therapy which she did not hear about. She has stopped telmisartan and BP is doing okay and she thinks this was making her nauseous.   Review of Systems  Constitutional: Negative.   HENT: Negative.    Eyes: Negative.   Respiratory:  Negative for cough, chest tightness and shortness of breath.   Cardiovascular:  Negative for chest pain, palpitations and leg swelling.  Gastrointestinal:  Positive for nausea. Negative for abdominal distention, abdominal pain, constipation, diarrhea and vomiting.  Musculoskeletal: Negative.   Skin: Negative.   Neurological: Negative.   Psychiatric/Behavioral: Negative.     Objective:  Physical Exam Constitutional:      Appearance: She is well-developed.  HENT:     Head: Normocephalic and atraumatic.  Cardiovascular:     Rate and Rhythm: Normal rate and regular rhythm.  Pulmonary:     Effort: Pulmonary effort is normal. No respiratory distress.     Breath sounds: Normal breath sounds. No wheezing or rales.  Abdominal:     General: Bowel sounds are normal. There is no distension.     Palpations: Abdomen is soft.     Tenderness: There is no abdominal tenderness. There is no rebound.  Musculoskeletal:     Cervical back: Normal range of motion.  Skin:    General: Skin is warm and dry.  Neurological:     Mental Status: She is alert and oriented to person, place, and time.     Coordination: Coordination normal.    Vitals:   07/08/21 1553  BP: 122/80  Pulse: 82  Resp: 18  SpO2: 94%  Weight: 178 lb 12.8 oz (81.1 kg)  Height: 5\' 4"  (1.626 m)    This visit occurred during the SARS-CoV-2 public health emergency.  Safety protocols were in place, including screening questions prior to the visit, additional usage of staff PPE, and  extensive cleaning of exam room while observing appropriate contact time as indicated for disinfecting solutions.   Assessment & Plan:

## 2021-07-08 NOTE — Patient Instructions (Addendum)
We will have you stop the pravastatin and take the amlodipine in the evening.  Give this 1-2 weeks and if still feeling bad stop the amlodipine and let us know.  For blood pressure the goal is <150/90 so if consistently higher than this let us know.

## 2021-07-11 NOTE — Assessment & Plan Note (Signed)
Reviewed normal CT head and offered vestibular therapy if dizziness returns.

## 2021-07-11 NOTE — Assessment & Plan Note (Signed)
She has stopped telmisartan due to nausea and BP is normal still. Continue amlodipine 5 mg daily and monitor closely.

## 2021-07-15 ENCOUNTER — Other Ambulatory Visit: Payer: Self-pay

## 2021-07-15 ENCOUNTER — Ambulatory Visit: Payer: Medicare PPO | Attending: Internal Medicine | Admitting: Physical Therapy

## 2021-07-15 ENCOUNTER — Encounter: Payer: Self-pay | Admitting: Physical Therapy

## 2021-07-15 DIAGNOSIS — H8111 Benign paroxysmal vertigo, right ear: Secondary | ICD-10-CM | POA: Diagnosis not present

## 2021-07-15 DIAGNOSIS — R2681 Unsteadiness on feet: Secondary | ICD-10-CM | POA: Diagnosis not present

## 2021-07-15 DIAGNOSIS — R42 Dizziness and giddiness: Secondary | ICD-10-CM | POA: Insufficient documentation

## 2021-07-15 NOTE — Therapy (Signed)
[facility] [address], [place], [place], [address] Phone: [phone]   Fax:  [phone]  Physical Therapy Evaluation  Patient Details  Name: Kayla Khan MRN: [id number] Date of Birth: 22-Jul-1951 Referring Provider (PT): Hoyt Koch, MD   Encounter Date: 07/15/2021   PT End of Session - 07/15/21 1023     Visit Number 1    Number of Visits 13    Date for PT Re-Evaluation 08/26/21    Authorization Type [employer]    PT Start Time 0919    PT Stop Time 1001    PT Time Calculation (min) 42 min    Activity Tolerance Patient tolerated treatment well    Behavior During Therapy [facility] for tasks assessed/performed             Past Medical History:  Diagnosis Date   Allergic conjunctivitis    Anemia, iron deficiency    Arthritis    Asthma    Chest tightness or pressure    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 h/o such - cor CT 03/2020 with minimal CAD, consider noncardiac causes of pain.   Palpitations    Pre-diabetes    Sleep apnea    Thyroid nodule    The patient had a thyroid nodule that was surgically removed in the past.  She has one half of her thyroid and does not take replacement of Taxol and he was benign    Past Surgical History:  Procedure Laterality Date   APPENDECTOMY     BREAST BIOPSY     CESAREAN SECTION     x2   GANGLION CYST EXCISION     THYROIDECTOMY, PARTIAL     left   TUBAL LIGATION     VESICOVAGINAL FISTULA CLOSURE W/ TAH      There were no vitals filed for this visit.    Subjective Assessment -  07/15/21 0921     Subjective Patient reports dizziness starting on 06/24/21. Woke up to go to the bathroom and when she got up she was dizzy. Assumed it was BPPV because she has had this before. Tried the Epley but it did not help and her symptoms seem different. Describes it as disoriented. Symptoms have improved slightly. Dizziness feels continuous but fluctuates throughout the day. Notes nausea and decreased appetite. Worse when laying down. Denies head trauma, infection/illness, vision changes/double vision, hearing loss, tinnitus, otalgia, photo/phonophobia. Notes baseline tinnitus but unchanged. Reports having had a recent head CT which was negative.    Pertinent History anemia, asthma, depression with anxiety, HTN, GERD, HLD, angina    Limitations Lifting;Standing;Walking;House hold activities    Diagnostic tests 06/30/21 head CT: negative    Patient Stated Goals improve dizziness and nausea    Currently in Pain? No/denies                [facility] PT Assessment - 07/15/21 0926       Assessment   Medical Diagnosis Dizziness    Referring Provider (PT) Hoyt Koch, MD  Onset Date/Surgical Date 06/24/21    Next MD Visit not scheduled    Prior Therapy yes      Precautions   Precautions None      Balance Screen   Has the patient fallen in the past 6 months No    Has the patient had a decrease in activity level because of a fear of falling?  No    Is the patient reluctant to leave their home because of a fear of falling?  No      Home Social worker Private residence    Living Arrangements Alone    Available Help at Discharge Family    Type of [place] to enter    Home Layout Two level    Alternate Level Stairs-Number of Steps 16    Alternate Level Stairs-Rails Right    Home Equipment None;Grab bars - tub/shower      Prior Function   Level of Independence Independent    Vocation Part time employment    Vocation  Requirements walking with a rolling bag; carrying up to 25lbs up stairs    Leisure gym      Cognition   Overall Cognitive Status Within Functional Limits for tasks assessed      Sensation   Light Touch Appears Intact   undergoing testing for periperal neuropathy in B hands/feet     Coordination   Gross Motor Movements are Fluid and Coordinated --    Finger Nose Finger Test mild dysmetria and intention tremor R>L hand   alt pron/supination test intact B     Posture/Postural Control   Posture/Postural Control Postural limitations    Postural Limitations Rounded Shoulders;Forward head;Increased thoracic kyphosis                    Vestibular Assessment - 07/15/21 0930       Oculomotor Exam   Oculomotor Alignment Normal    Ocular ROM WNL    Spontaneous Absent    Gaze-induced  Absent    Smooth Pursuits Intact    Saccades --   1 corrective saccade with L and upward saccades- normal for age; c/o dizziness with upward gaze   Comment convergence: c/o blurred vision at ~7 inch      Oculomotor Exam-Fixation Suppressed    Left Head Impulse slightly positive    Right Head Impulse slightly positive      Vestibulo-Ocular Reflex   VOR 1 Head Only (x 1 viewing) VOR slow and symptomatic in horizontal and vertical directions    VOR Cancellation Corrective saccades   in B directions; mild dizziness     Positional Testing   Dix-Hallpike Dix-Hallpike Right;Dix-Hallpike Left    Horizontal Canal Testing Horizontal Canal Right;Horizontal Canal Left;Horizontal Canal Right Intensity;Horizontal Canal Left Intensity      Dix-Hallpike Right   Dix-Hallpike Right Duration 20 sec    Dix-Hallpike Right Symptoms Upbeat, right rotatory nystagmus      Horizontal Canal Right   Horizontal Canal Right Duration 20sec    Horizontal Canal Right Symptoms --   upbeating R torsional nystagmus and dizziness     Horizontal Canal Left   Horizontal Canal Left Duration 0    Horizontal Canal Left Symptoms  Normal                Objective measurements completed on examination: See above findings.       [facility] Adult PT Treatment/Exercise - 07/15/21 2725  Ambulation/Gait   Assistive device None    Gait Pattern Step-to pattern;Step-through pattern;Poor foot clearance - right;Poor foot clearance - left;Lateral trunk lean to left;Lateral trunk lean to right    Ambulation Surface Level;Indoor    Gait velocity decreased             Vestibular Treatment/Exercise - 07/15/21 0001       Vestibular Treatment/Exercise   Vestibular Treatment Provided Canalith Repositioning    Canalith Repositioning Epley Manuever Right       EPLEY MANUEVER RIGHT   Number of Reps  1    Overall Response Improved Symptoms    Response Details  tolerated well                    PT Education - 07/15/21 1022     Education Details prognosis, POC, HEP; edu on abnormal oculomotor clinical findings    Person(s) Educated Patient    Methods Explanation;Demonstration;Tactile cues;Verbal cues;Handout    Comprehension Verbalized understanding              PT Short Term Goals - 07/15/21 1029       PT SHORT TERM GOAL #1   Title Patient to be independent with initial HEP.    Time 3    Period Weeks    Status New    Target Date 08/05/21               PT Long Term Goals - 07/15/21 1029       PT LONG TERM GOAL #1   Title Patient to be independent with advanced HEP.    Time 6    Period Weeks    Status New    Target Date 08/26/21      PT LONG TERM GOAL #2   Title Patient to report 010 dizziness with standing vertical and horizontal head movements at variable speeds.    Time 6    Period Weeks    Status New    Target Date 08/26/21      PT LONG TERM GOAL #3   Title Patient will report 010 dizziness with bed mobility.    Time 6    Period Weeks    Status New    Target Date 08/26/21      PT LONG TERM GOAL #4   Title Patient to score at least 20/24 on DGI in order to  decrease risk of falls.    Time 6    Period Weeks    Status New    Target Date 08/26/21      PT LONG TERM GOAL #5   Title Patient to report 75% improvement in dizziness.    Time 6    Period Weeks    Status New    Target Date 08/26/21                    Plan - 07/15/21 1023     Clinical Impression Statement Patient is a 70 y/o F presenting to OPPT with c/o acute onset of dizziness since 06/24/21. Dizziness is constant but fluctuates throughout the day; worse when laying down. Describes episodes as "disoriented." Denies head trauma, infection/illness, vision changes/double vision, hearing loss, tinnitus, otalgia, photo/phonophobia. Notes baseline tinnitus that is unchanged. Oculomotor exam revealed c/o dizziness with saccadic testing, slow VOR, and VOR cancellation, convergence insufficiency, slightly positive R/L HIT, corrective saccades with VOR cancellation. Coordination testing revealed mild dysmetria and intention tremor R>L UE. Patient with positive R DH, which was  treated with R Epley. Patient was educated on post-Epley precautions and VOR to be started tomorrow. Patient reported understanding. Contacted patient's referring MD to notify of abnormal central findings on exam. Would benefit from skilled PT services 1-2x/week for 6 weeks to address aforementioned impairments in order to optimize level of function.    Personal Factors and Comorbidities Age;Comorbidity 3+;Time since onset of injury/illness/exacerbation;Profession;Past/Current Experience    Comorbidities anemia, asthma, depression with anxiety, HTN, GERD, HLD, angina    Examination-Activity Limitations Bathing;Locomotion Level;Transfers;Bed Mobility;Reach Overhead;Bend;Carry;Sleep;Squat;Dressing;Stairs;Stand;Hygiene/Grooming;Lift;Toileting    Examination-Participation Restrictions Yard Work;Shop;Driving;Community Activity;Cleaning;Occupation;Church;Meal Prep    Stability/Clinical Decision Making Unstable/Unpredictable     Clinical Decision Making High    Rehab Potential Good    PT Frequency Other (comment)   1-2x   PT Duration 6 weeks    PT Treatment/Interventions ADLs/Self Care Home Management;Canalith Repostioning;Cryotherapy;DME Instruction;Moist Heat;Gait training;Stair training;Functional mobility training;Therapeutic activities;Balance training;Neuromuscular re-education;Manual techniques;Patient/family education;Passive range of motion;Energy conservation;Visual/perceptual remediation/compensation;Vestibular    PT Next Visit Plan reassess HEP and R DH; assess DGI; progress habituation and VOR training    Consulted and Agree with Plan of Care Patient             Patient will benefit from skilled therapeutic intervention in order to improve the following deficits and impairments:  Abnormal gait, Decreased coordination, Decreased range of motion, Difficulty walking, Dizziness, Decreased activity tolerance, Decreased balance, Impaired flexibility, Improper body mechanics, Postural dysfunction, Decreased strength  Visit Diagnosis: BPPV (benign paroxysmal positional vertigo), right  Dizziness and giddiness  Unsteadiness on feet     Problem List Patient Active Problem List   Diagnosis Date Noted   Neurological deficit present 06/26/2021   Paresthesia 05/22/2021   SOB (shortness of breath) on exertion 04/28/2021   GAD (generalized anxiety disorder) 04/07/2021   Neuropathy 02/17/2021   Primary osteoarthritis of left shoulder 08/19/2020   Atherosclerosis of aorta ([place]) 05/15/2020   Psychophysiological insomnia 05/15/2020   Moderate episode of recurrent major depressive disorder ([place]) 04/01/2020   Tinea cruris 02/06/2020   Visit for screening mammogram 12/14/2016   Dizziness 03/14/2016   Spinal stenosis of lumbar region with radiculopathy 04/18/2015   Osteopenia, senile 02/20/2015   Obesity (BMI 30.0-34.9) 03/22/2013   Routine general medical examination at a health care facility 10/19/2011    Prediabetes 07/05/2009   Hyperlipidemia with target LDL less than 130 12/11/2008   Iron deficiency anemia 05/03/2008   Essential hypertension 05/03/2008   Asthma 05/03/2008   GERD 05/03/2008   OSA (obstructive sleep apnea) 05/03/2008    Janene Harvey, PT, DPT 07/15/21 10:36 AM   [facility] [address], [place], [place], [address] Phone: [phone]   Fax:  [phone]  Name: Kayla Khan MRN: [id number] Date of Birth: August 25, 1951

## 2021-07-16 ENCOUNTER — Other Ambulatory Visit: Payer: Self-pay | Admitting: Physician Assistant

## 2021-07-18 ENCOUNTER — Ambulatory Visit: Payer: Medicare PPO | Admitting: Physical Therapy

## 2021-07-18 ENCOUNTER — Other Ambulatory Visit: Payer: Self-pay

## 2021-07-18 ENCOUNTER — Encounter: Payer: Self-pay | Admitting: Physical Therapy

## 2021-07-18 DIAGNOSIS — R42 Dizziness and giddiness: Secondary | ICD-10-CM

## 2021-07-18 DIAGNOSIS — R2681 Unsteadiness on feet: Secondary | ICD-10-CM | POA: Diagnosis not present

## 2021-07-18 DIAGNOSIS — H8111 Benign paroxysmal vertigo, right ear: Secondary | ICD-10-CM | POA: Diagnosis not present

## 2021-07-18 NOTE — Therapy (Signed)
[facility] [address] [place], [place], [place], [address] Phone: [phone]   Fax:  [phone]  Physical Therapy Treatment  Patient Details  Name: Kayla Khan MRN: [id number] Date of Birth: 09/04/1951 Referring Provider (PT): Hoyt Koch, MD   Encounter Date: 07/18/2021   PT End of Session - 07/18/21 1010     Visit Number 2    Number of Visits 13    Date for PT Re-Evaluation 08/26/21    Authorization Type [employer]    PT Start Time 0920    PT Stop Time 1007    PT Time Calculation (min) 47 min    Activity Tolerance Patient tolerated treatment well    Behavior During Therapy [facility] for tasks assessed/performed             Past Medical History:  Diagnosis Date   Allergic conjunctivitis    Anemia, iron deficiency    Arthritis    Asthma    Chest tightness or pressure    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 h/o such - cor CT 03/2020 with minimal CAD, consider noncardiac causes of pain.   Palpitations    Pre-diabetes    Sleep apnea    Thyroid nodule    The patient had a thyroid nodule that was surgically removed in the past.  She has one half of her thyroid and does not take replacement of Taxol and he was benign    Past Surgical History:  Procedure Laterality Date   APPENDECTOMY     BREAST BIOPSY     CESAREAN SECTION     x2   GANGLION CYST EXCISION     THYROIDECTOMY, PARTIAL     left   TUBAL LIGATION     VESICOVAGINAL FISTULA CLOSURE W/ TAH      There were no vitals filed for this visit.   Subjective Assessment -  07/18/21 0922     Subjective Had some nausea this AM and took Meclizine. Doing her HEP, but doesn;t think the BPPV is cleared yet.    Pertinent History anemia, asthma, depression with anxiety, HTN, GERD, HLD, angina    Diagnostic tests 06/30/21 head CT: negative    Patient Stated Goals improve dizziness and nausea    Currently in Pain? No/denies                [facility] PT Assessment - 07/18/21 0001       Standardized Balance Assessment   Standardized Balance Assessment Dynamic Gait Index      Dynamic Gait Index   Level Surface Normal    Change in Gait Speed Normal    Gait with Horizontal Head Turns Moderate Impairment    Gait with Vertical Head Turns Mild Impairment    Gait and Pivot Turn Normal    Step Over Obstacle Mild Impairment    Step Around Obstacles Normal    Steps Mild Impairment    Total Score 19  Vestibular Assessment - 07/18/21 0001       Dix-Hallpike Right   Dix-Hallpike Right Duration 2 min    Dix-Hallpike Right Symptoms Upbeat, right rotatory nystagmus   2nd trial lasted ~30 sec                      Vestibular Treatment/Exercise - 07/18/21 0001       Vestibular Treatment/Exercise   Gaze Exercises X1 Viewing Horizontal;X1 Viewing Vertical;Eye/Head Exercise Horizontal       EPLEY MANUEVER RIGHT   Number of Reps  2    Overall Response Improved Symptoms    Response Details  1st trial c/o dizziness upon L head turn but no nystagmus      X1 Viewing Horizontal   Foot Position sitting, standing    Reps 10   2x10   Comments very mild dizziness   cues to decrease cervical ROM     X1 Viewing Vertical   Foot Position sitting    Reps 10    Comments 4/10 dizziness      Eye/Head Exercise Horizontal   Foot Position sitting, standing    Reps 10   2x10   Comments VOR cancellation   mild dizziness in standing                   PT Education - 07/18/21 1007     Education Details update to HEP- to be performed  tomorrow; access code: [email or web address]    Person(s) Educated Patient    Methods Explanation;Demonstration;Tactile cues;Verbal cues;Handout    Comprehension Verbalized understanding;Returned demonstration              PT Short Term Goals - 07/18/21 1011       PT SHORT TERM GOAL #1   Title Patient to be independent with initial HEP.    Time 3    Period Weeks    Status Achieved    Target Date 08/05/21               PT Long Term Goals - 07/18/21 1011       PT LONG TERM GOAL #1   Title Patient to be independent with advanced HEP.    Time 6    Period Weeks    Status On-going      PT LONG TERM GOAL #2   Title Patient to report 010 dizziness with standing vertical and horizontal head movements at variable speeds.    Time 6    Period Weeks    Status On-going      PT LONG TERM GOAL #3   Title Patient will report 010 dizziness with bed mobility.    Time 6    Period Weeks    Status On-going      PT LONG TERM GOAL #4   Title Patient to score at least 20/24 on DGI in order to decrease risk of falls.    Time 6    Period Weeks    Status On-going      PT LONG TERM GOAL #5   Title Patient to report 75% improvement in dizziness.    Time 6    Period Weeks    Status On-going                   Plan - 07/18/21 1010     Clinical Impression Statement Patient without new complaints. Patient scored 19/24 on DGI, indicating an increased risk of falls. Reviewed HEP with patient able to progress  to standing with horizontal head turns. Patient with upbeating torsional nystagmus with R DH, thus treated with R Epley. Notes c/o dizziness upon L head turn but no nystagmus. Patient with more mild nystagmus on 2nd trial of R DH, treated again with R Epley and tolerated this well. Updated HEP with VOR and habituation to be started tomorrow. Patient reported understanding and reported feeling better upon leaving.    Comorbidities anemia, asthma, depression with anxiety, HTN, GERD, HLD,  angina    PT Treatment/Interventions ADLs/Self Care Home Management;Canalith Repostioning;Cryotherapy;DME Instruction;Moist Heat;Gait training;Stair training;Functional mobility training;Therapeutic activities;Balance training;Neuromuscular re-education;Manual techniques;Patient/family education;Passive range of motion;Energy conservation;Visual/perceptual remediation/compensation;Vestibular    PT Next Visit Plan reassess HEP and R DH; progress habituation and VOR training, balance    Consulted and Agree with Plan of Care Patient             Patient will benefit from skilled therapeutic intervention in order to improve the following deficits and impairments:  Abnormal gait, Decreased coordination, Decreased range of motion, Difficulty walking, Dizziness, Decreased activity tolerance, Decreased balance, Impaired flexibility, Improper body mechanics, Postural dysfunction, Decreased strength  Visit Diagnosis: BPPV (benign paroxysmal positional vertigo), right  Dizziness and giddiness  Unsteadiness on feet     Problem List Patient Active Problem List   Diagnosis Date Noted   Neurological deficit present 06/26/2021   Paresthesia 05/22/2021   SOB (shortness of breath) on exertion 04/28/2021   GAD (generalized anxiety disorder) 04/07/2021   Neuropathy 02/17/2021   Primary osteoarthritis of left shoulder 08/19/2020   Atherosclerosis of aorta ([place]) 05/15/2020   Psychophysiological insomnia 05/15/2020   Moderate episode of recurrent major depressive disorder ([place]) 04/01/2020   Tinea cruris 02/06/2020   Visit for screening mammogram 12/14/2016   Dizziness 03/14/2016   Spinal stenosis of lumbar region with radiculopathy 04/18/2015   Osteopenia, senile 02/20/2015   Obesity (BMI 30.0-34.9) 03/22/2013   Routine general medical examination at a health care facility 10/19/2011   Prediabetes 07/05/2009   Hyperlipidemia with target LDL less than 130 12/11/2008   Iron deficiency anemia  05/03/2008   Essential hypertension 05/03/2008   Asthma 05/03/2008   GERD 05/03/2008   OSA (obstructive sleep apnea) 05/03/2008     Janene Harvey, PT, DPT 07/18/21 10:13 AM   [facility] [address] [place], [place], [address] Phone: [phone]   Fax:  [phone]  Name: CHRISTELLE IGOE MRN: [id number] Date of Birth: [birth year]

## 2021-07-21 ENCOUNTER — Other Ambulatory Visit: Payer: Self-pay

## 2021-07-21 ENCOUNTER — Other Ambulatory Visit: Payer: Medicare PPO

## 2021-07-21 DIAGNOSIS — I2510 Atherosclerotic heart disease of native coronary artery without angina pectoris: Secondary | ICD-10-CM

## 2021-07-21 DIAGNOSIS — E785 Hyperlipidemia, unspecified: Secondary | ICD-10-CM

## 2021-07-21 DIAGNOSIS — I10 Essential (primary) hypertension: Secondary | ICD-10-CM

## 2021-07-21 LAB — HEPATIC FUNCTION PANEL
ALT: 16 IU/L (ref 0–32)
AST: 17 IU/L (ref 0–40)
Albumin: 4.3 g/dL (ref 3.7–4.7)
Alkaline Phosphatase: 73 IU/L (ref 44–121)
Bilirubin Total: 0.3 mg/dL (ref 0.0–1.2)
Bilirubin, Direct: <0.1 mg/dL (ref 0.00–0.40)
Total Protein: 6.6 g/dL (ref 6.0–8.5)

## 2021-07-21 LAB — LIPID PANEL
Chol/HDL Ratio: 2.7 ratio (ref 0.0–4.4)
Cholesterol, Total: 133 mg/dL (ref 100–199)
HDL: 49 mg/dL (ref >39)
LDL Chol Calc (NIH): 71 mg/dL (ref 0–99)
Triglycerides: 59 mg/dL (ref 0–149)
VLDL Cholesterol Cal: 13 mg/dL (ref 5–40)

## 2021-07-22 ENCOUNTER — Telehealth: Payer: Self-pay | Admitting: Physician Assistant

## 2021-07-22 NOTE — Telephone Encounter (Signed)
-----   Message from Charlie Pitter, [place] sent at 07/22/2021  7:44 AM EST ----- Please let patient know labs are stable! LDL just barely above goal of <70 but similar to prior year. Since tolerating current statin would continue present regimen as planned.

## 2021-07-22 NOTE — Telephone Encounter (Signed)
Returned the pt's call and she has been made aware of her lab results.

## 2021-07-22 NOTE — Telephone Encounter (Signed)
Follow Up:    Patient is returning Kayla W.'s call, concerning her lab results.

## 2021-07-23 ENCOUNTER — Other Ambulatory Visit: Payer: Self-pay

## 2021-07-23 ENCOUNTER — Ambulatory Visit (INDEPENDENT_AMBULATORY_CARE_PROVIDER_SITE_OTHER): Payer: Medicare PPO | Admitting: Neurology

## 2021-07-23 ENCOUNTER — Ambulatory Visit: Payer: Medicare PPO | Admitting: Neurology

## 2021-07-23 DIAGNOSIS — R202 Paresthesia of skin: Secondary | ICD-10-CM | POA: Diagnosis not present

## 2021-07-23 DIAGNOSIS — M48061 Spinal stenosis, lumbar region without neurogenic claudication: Secondary | ICD-10-CM | POA: Insufficient documentation

## 2021-07-23 NOTE — Progress Notes (Signed)
ASSESSMENT AND PLAN  Kayla Khan is a 70 y.o. female   Intermittent bilateral upper and lower extremity paresthesia  Often improved by changing her neck position  MRI of cervical spine showed no significant canal or foraminal narrowing  EMG nerve conduction study is essentially normal, no evidence of upper extremity neuropathy, peripheral neuropathy or right lumbosacral radiculopathy.  Laboratory evaluation showed slight elevated A1c 5.8, she would benefit exercise, weight control Lumbar stenosis  We again reviewed MRI lumbar in 2016, moderate to severe lumbar stenosis at L4-5 level,  She has no evidence of severe radicular pain, gait abnormality, incontinence, we will continue conservative treatment, return to clinic for worsening symptoms  DIAGNOSTIC DATA (LABS, IMAGING, TESTING) - I reviewed patient records, labs, notes, testing and imaging myself where available. Lumbar stenosis MRI Lumbar August 2016 L2-3 mild spinal stenosis has progressed since 2007   L3-4 moderate spinal stenosis has progressed since prior study   Moderate to severe spinal stenosis L4-5 has progressed. There is disc bulging and central disc protrusion. Expected impingement of the L5 nerve root bilaterally.   Mild spinal stenosis L5-S1 with mild impingement of the left S1 nerve root.  Laboratory evaluations in August 2022: Normal CBC, CMP, lipid profile, B34 193, CK, folic acid,  Laboratory evaluation showed A1c 5.8, otherwise no significant abnormality on protein electrophoresis, liver functional test, lipid panel, ANA, ESR, C-reactive protein, TSH, RPR, CBC, CMP,   MEDICAL HISTORY:  Kayla Khan, is a 70 year old female, seen in request by her primary care physician Dr. Harrison Mons, Arvid Right, elevation of bilateral upper and lower extremity paresthesia, initial evaluation was on May 22, 2021,  I reviewed and summarized the referring note. PMHX HTN HLD  She noticed intermittent  bilateral toes numbness tingling since 2015, mainly involving bottom of her feet, and top of her toes, over the past few years, only slight progression, she denies significant low back pain, no gait abnormality, no bowel and bladder incontinence  I personally reviewed MRI of lumbar spine in August 2016, multilevel degenerative changes, most prominent at L4-5, moderate to severe spinal stenosis, moderate canal stenosis at L3-4,  Since 2018, she also find intermittent bilateral finger numbness tingling, mostly at nighttime, woke her up felt bilateral hands numbness tingling, relieved by load on her pillow, lying flat, or changing her neck position, she denies persistent upper extremity paresthesia or weakness  Update July 23, 2021: She is overall doing well, after she changed to higher pillow, she rarely has numbness tingling of fingers, she denies significant low back pain, no radiating pain to bilateral lower extremity, has occasionally bilateral toes numbness cold sensation, no gait abnormality, no bowel bladder incontinence  We personally reviewed MRI of cervical spine October 2022: Multilevel mild degenerative changes no significant canal foraminal narrowing  She also had a CT head without contrast that was normal June 30, 2021, it was ordered by primary care for her complaints of dizziness  EMG nerve conduction study today is essentially normal, no evidence of upper extremity neuropathy, large fiber peripheral neuropathy, or right lumbar radiculopathy.   PHYSICAL EXAM:   There were no vitals filed for this visit.  Not recorded     There is no height or weight on file to calculate BMI.  PHYSICAL EXAMNIATION:  Gen: NAD, conversant, well nourised, well groomed      NEUROLOGICAL EXAM:  MENTAL STATUS: Speech/cognition: Awake, alert, oriented to history taking and casual conversation  CRANIAL NERVES: CN II: Visual fields are full  to confrontation. Pupils are round equal and  briskly reactive to light. CN III, IV, VI: extraocular movement are normal. No ptosis. CN V: Facial sensation is intact to light touch CN VII: Face is symmetric with normal eye closure  CN VIII: Hearing is normal to causal conversation. CN IX, X: Phonation is normal. CN XI: Head turning and shoulder shrug are intact  MOTOR: Normal muscle tone, bulk, strength  REFLEXES: Reflexes are 1 and symmetric at the biceps, triceps, knees, and ankles. Plantar responses are flexor.  SENSORY: Slight length dependent light touch, pinprick and vibratory sensation in the middle of the foot,  COORDINATION: There is no trunk or limb dysmetria noted.  GAIT/STANCE: Posture is normal.    REVIEW OF SYSTEMS:  Full 14 system review of systems performed and notable only for as above All other review of systems were negative.   ALLERGIES: Allergies  Allergen Reactions   Amoxicillin-Pot Clavulanate    Beta Adrenergic Blockers     Makes chest pain worse   Latex    Meperidine Hcl    Hctz [Hydrochlorothiazide] Nausea And Vomiting    HOME MEDICATIONS: Current Outpatient Medications  Medication Sig Dispense Refill   ALPRAZolam (XANAX) 0.25 MG tablet Take 1 tablet (0.25 mg total) by mouth 2 (two) times daily as needed. 35 tablet 2   amLODipine (NORVASC) 5 MG tablet TAKE 1 TABLET(5 MG) BY MOUTH DAILY 90 tablet 1   Arginine 1000 MG TABS Take 3,000 mg by mouth in the morning and at bedtime.     Ascorbic Acid (VITAMIN C) 1000 MG tablet Take 1,000 mg by mouth daily.     aspirin EC 81 MG tablet Take 81 mg by mouth daily.     Cholecalciferol (VITAMIN D) 50 MCG (2000 UT) tablet Take 2,000 Units by mouth daily.     clobetasol ointment (TEMOVATE) 8.81 % Apply 1 application topically 2 (two) times daily. 30 g 2   co-enzyme Q-10 30 MG capsule Take 100 mg by mouth daily.     conjugated estrogens (PREMARIN) vaginal cream Place 1 Applicatorful vaginally 2 (two) times a week.      famotidine (PEPCID) 40 MG tablet  Take 1 tablet (40 mg total) by mouth at bedtime. 90 tablet 1   fish oil-omega-3 fatty acids 1000 MG capsule Take 3 g by mouth daily.     meclizine (ANTIVERT) 25 MG tablet Take 25 mg by mouth 3 (three) times daily as needed for dizziness.     nystatin-triamcinolone ointment (MYCOLOG) Apply 1 application topically as needed (yeast infection).     pravastatin (PRAVACHOL) 40 MG tablet TAKE 1 TABLET(40 MG) BY MOUTH EVERY EVENING 90 tablet 2   Suvorexant (BELSOMRA) 15 MG TABS Take 1 tablet by mouth at bedtime as needed. 30 tablet 2   No current facility-administered medications for this visit.    PAST MEDICAL HISTORY: Past Medical History:  Diagnosis Date   Allergic conjunctivitis    Anemia, iron deficiency    Arthritis    Asthma    Chest tightness or pressure    Presumed microvascular angina,, 2004 normal coronary arteries / nuclear December, 2008, normal   Depression with anxiety    Diverticulosis of colon    DJD (degenerative joint disease)    Ejection fraction    70%, catheter 2004 / normal LV function nuclear, 2008   Essential hypertension    Fatigue    GERD (gastroesophageal reflux disease)    HLA B27 positive    HLD (hyperlipidemia)  Microvascular angina (HCC)    a. h/o such - cor CT 03/2020 with minimal CAD, consider noncardiac causes of pain.   Palpitations    Pre-diabetes    Sleep apnea    Thyroid nodule    The patient had a thyroid nodule that was surgically removed in the past.  She has one half of her thyroid and does not take replacement of Taxol and he was benign    PAST SURGICAL HISTORY: Past Surgical History:  Procedure Laterality Date   APPENDECTOMY     BREAST BIOPSY     CESAREAN SECTION     x2   GANGLION CYST EXCISION     THYROIDECTOMY, PARTIAL     left   TUBAL LIGATION     VESICOVAGINAL FISTULA CLOSURE W/ TAH      FAMILY HISTORY: Family History  Problem Relation Age of Onset   Cancer Mother    Hypertension Father    Aneurysm Father 27        cerebral hemorrhage   Diabetes Other    Heart disease Other    Cancer Other    Arthritis Other    Hypertension Other    Aneurysm Other    Heart attack Maternal Grandfather    Stroke Paternal Grandfather    Stroke Maternal Grandmother    Cancer Brother     SOCIAL HISTORY: Social History   Socioeconomic History   Marital status: Divorced    Spouse name: Not on file   Number of children: Not on file   Years of education: Not on file   Highest education level: Not on file  Occupational History   Occupation: Pharmacist, hospital  Tobacco Use   Smoking status: Never   Smokeless tobacco: Never  Substance and Sexual Activity   Alcohol use: No   Drug use: No   Sexual activity: Not Currently  Other Topics Concern   Not on file  Social History Narrative   Not on file   Social Determinants of Health   Financial Resource Strain: Low Risk    Difficulty of Paying Living Expenses: Not hard at all  Food Insecurity: No Food Insecurity   Worried About Charity fundraiser in the Last Year: Never true   Ran Out of Food in the Last Year: Never true  Transportation Needs: No Transportation Needs   Lack of Transportation (Medical): No   Lack of Transportation (Non-Medical): No  Physical Activity: Sufficiently Active   Days of Exercise per Week: 5 days   Minutes of Exercise per Session: 30 min  Stress: No Stress Concern Present   Feeling of Stress : Not at all  Social Connections: Moderately Integrated   Frequency of Communication with Friends and Family: More than three times a week   Frequency of Social Gatherings with Friends and Family: More than three times a week   Attends Religious Services: More than 4 times per year   Active Member of [employer] or Organizations: Yes   Attends Archivist Meetings: More than 4 times per year   Marital Status: Never married  Intimate Partner Violence: Not on file      Marcial Pacas, M.D. Ph.D.  [facility] [address] [place], [place] [address] Ph: [phone] Fax: [phone]  CC:  Janith Lima, MD [place],  [place] [address]  Janith Lima, MD

## 2021-07-23 NOTE — Procedures (Signed)
Full Name: Kayla Khan Gender: Female MRN #: [id number] Date of Birth: 1951-02-06    Visit Date: 07/23/2021 07:50 Age: 70 Years Examining Physician: Marcial Pacas, MD  Referring Physician: Marcial Pacas, MD Height: 5 feet 4 inch Patient History: 178lbs History: 70 year old female complains of intermittent upper and lower extremity paresthesia  Summary of the test: Nerve conduction study: Right sural, superficial peroneal, median, ulnar  sensory responses were normal. Right tibial, peroneal to EDB, median and ulnar motor responses were normal  Electromyography: Selected needle examinations of right upper, lower extremity muscles were normal, there was no spontaneous activity at Right cervical and right lumbosacral paraspinal muscles.  Conclusion: This is a normal study.  There is no electrodiagnostic evidence of right upper extremity neuropathy, peripheral neuropathy, right lumbosacral radiculopathy.    ------------------------------- Physician Name, M.D.  [facility] [address] [place], [place] [address] Tel: [phone] Fax: [phone]  Verbal informed consent was obtained from the patient, patient was informed of potential risk of procedure, including bruising, bleeding, hematoma formation, infection, muscle weakness, muscle pain, numbness, among others.        [place]    Nerve / Sites Muscle Latency Ref. Amplitude Ref. Rel Amp Segments Distance Velocity Ref. Area    ms ms mV mV %  cm m/s m/s mVms  R Median - APB     Wrist APB 3.1 ?4.4 5.6 ?4.0 100 Wrist - APB 7   21.8     Upper arm APB 6.8  5.5  98.1 Upper arm - Wrist 21 58 ?49 21.2  R Ulnar - ADM     Wrist ADM 2.6 ?3.3 10.9 ?6.0 100 Wrist - ADM 7   27.6     B.Elbow ADM 5.9  10.3  94.7 B.Elbow - Wrist 20 60 ?49 26.1     A.Elbow ADM 7.7  10.0  97.2 A.Elbow - B.Elbow 10 56 ?49 26.1  R Peroneal - EDB     Ankle EDB 3.7 ?6.5 4.6 ?2.0 100 Ankle - EDB 9   16.0     Fib head EDB 9.7  4.4   97.3 Fib head - Ankle 28 46 ?44 16.3     Pop fossa EDB 11.9  4.3  97.5 Pop fossa - Fib head 10 46 ?44 15.9         Pop fossa - Ankle      R Tibial - AH     Ankle AH 3.1 ?5.8 15.4 ?4.0 100 Ankle - AH 9   25.1     Pop fossa AH 11.7  10.7  69.6 Pop fossa - Ankle 38 44 ?41 20.6             SNC    Nerve / Sites Rec. Site Peak Lat Ref.  Amp Ref. Segments Distance Peak Diff Ref.    ms ms V V  cm ms ms  R Sural - Ankle (Calf)     Calf Ankle 3.4 ?4.4 9 ?6 Calf - Ankle 14    R Superficial peroneal - Ankle     Lat leg Ankle 3.7 ?4.4 6 ?6 Lat leg - Ankle 14    R Median, Ulnar - Transcarpal comparison     Median Palm Wrist 2.0 ?2.2 108 ?35 Median Palm - Wrist 8       Ulnar Palm Wrist 2.0 ?2.2 36 ?12 Ulnar Palm - Wrist 8          Median Palm - Ulnar  Palm  0.0 ?0.4  R Median - Orthodromic (Dig II, Mid palm)     Dig II Wrist 2.9 ?3.4 20 ?10 Dig II - Wrist 13    R Ulnar - Orthodromic, (Dig V, Mid palm)     Dig V Wrist 2.6 ?3.1 10 ?5 Dig V - Wrist 24                 F  Wave    Nerve F Lat Ref.   ms ms  R Tibial - AH 51.5 ?56.0  R Ulnar - ADM 27.5 ?32.0         EMG Summary Table    Spontaneous MUAP Recruitment  Muscle IA Fib PSW Fasc Other Amp Dur. Poly Pattern  R. First dorsal interosseous Normal None None None _______ Normal Normal Normal Normal  R. Pronator teres Normal None None None _______ Normal Normal Normal Normal  R. Biceps brachii Normal None None None _______ Normal Normal Normal Normal  R. Deltoid Normal None None None _______ Normal Normal Normal Normal  R. Triceps brachii Normal None None None _______ Normal Normal Normal Normal  R. Brachioradialis Normal None None None _______ Normal Normal Normal Normal  R. Tibialis anterior Normal None None None _______ Normal Normal Normal Normal  R. Peroneus longus Normal None None None _______ Normal Normal Normal Normal  R. Tibialis posterior Normal None None None _______ Normal Normal Normal Normal  R. Vastus lateralis Normal None  None None _______ Normal Normal Normal Normal  R. Lumbar paraspinals (low) Normal None None None _______ Normal Normal Normal Normal  R. Lumbar paraspinals (mid) Normal None None None _______ Normal Normal Normal Normal  R. Cervical paraspinals Normal None None None _______ Normal Normal Normal Normal

## 2021-07-24 ENCOUNTER — Ambulatory Visit: Payer: Medicare PPO | Admitting: Physical Therapy

## 2021-07-24 ENCOUNTER — Other Ambulatory Visit: Payer: Self-pay

## 2021-07-24 ENCOUNTER — Encounter: Payer: Self-pay | Admitting: Physical Therapy

## 2021-07-24 DIAGNOSIS — R42 Dizziness and giddiness: Secondary | ICD-10-CM | POA: Diagnosis not present

## 2021-07-24 DIAGNOSIS — R2681 Unsteadiness on feet: Secondary | ICD-10-CM

## 2021-07-24 DIAGNOSIS — H8111 Benign paroxysmal vertigo, right ear: Secondary | ICD-10-CM | POA: Diagnosis not present

## 2021-07-24 NOTE — Therapy (Signed)
[facility] [place], [place], [place], [address] Phone: [phone]   Fax:  [phone]  Physical Therapy Treatment  Patient Details  Name: Kayla Khan MRN: [id number] Date of Birth: 1951/06/20 Referring Provider (PT): Hoyt Koch, MD   Encounter Date: 07/24/2021   PT End of Session - 07/24/21 1445     Visit Number 3    Number of Visits 13    Date for PT Re-Evaluation 08/26/21    Authorization Type [employer]    Authorization Time Period 12 visits approved 11/08-12/20    Authorization - Visit Number 2    Authorization - Number of Visits 12    PT Start Time 1406    PT Stop Time 1444    PT Time Calculation (min) 38 min    Equipment Utilized During Treatment Gait belt    Activity Tolerance Patient tolerated treatment well    Behavior During Therapy [facility] for tasks assessed/performed             Past Medical History:  Diagnosis Date   Allergic conjunctivitis    Anemia, iron deficiency    Arthritis    Asthma    Chest tightness or pressure    Presumed microvascular angina,, 2004 normal coronary arteries / nuclear December, 2008, normal   Depression with anxiety    Diverticulosis of colon    DJD (degenerative joint disease)    Ejection fraction    70%, catheter 2004 / normal LV function nuclear, 2008   Essential hypertension    Fatigue    GERD (gastroesophageal reflux disease)    HLA B27 positive    HLD (hyperlipidemia)    Microvascular angina ([place])    a. h/o such - cor CT 03/2020 with minimal CAD, consider noncardiac causes of pain.   Palpitations    Pre-diabetes    Sleep apnea    Thyroid nodule    The patient had a thyroid nodule that was surgically removed in the past.  She has one half of her thyroid and does not take replacement of Taxol and he was benign    Past Surgical History:  Procedure Laterality Date   APPENDECTOMY     BREAST BIOPSY     CESAREAN SECTION     x2   GANGLION CYST  EXCISION     THYROIDECTOMY, PARTIAL     left   TUBAL LIGATION     VESICOVAGINAL FISTULA CLOSURE W/ TAH      There were no vitals filed for this visit.   Subjective Assessment - 07/24/21 1406     Subjective Feeling so muhc better. Has been able to have 2 days without the Meclizine. Feels that the BPPV is gone because she no longer had spinning when she laid down recently. Notes some remainting dizziness with turning to look behind her and looking up.    Pertinent History anemia, asthma, depression with anxiety, HTN, GERD, HLD, angina    Diagnostic tests 06/30/21 head CT: negative    Patient Stated Goals improve dizziness and nausea    Currently in Pain? No/denies                               Kayla Khan Adult PT Treatment/Exercise - 07/24/21 0001       Neuro Re-ed    Neuro Re-ed Details  EC head turns 10x feet wide, 10x feet narrow, head nods 10x feet wide, 10x feet  narrow             Vestibular Treatment/Exercise - 07/24/21 0001       Vestibular Treatment/Exercise   Habituation Exercises Legrand Como Daroff   Number of Reps  3    Symptom Description  asymptomatic B with EO, EC, EC and assistance from PT to increase speed      X1 Viewing Horizontal   Foot Position standing    Reps 10    Comments 1/10 dizziness      X1 Viewing Vertical   Foot Position standing    Reps 10    Comments 3-4/10 dizziness   mild sway     Eye/Head Exercise Horizontal   Foot Position standing    Reps 10    Comments VOR cancellation                Balance Exercises - 07/24/21 0001       Balance Exercises: Standing   Standing Eyes Closed Foam/compliant surface;Narrow base of support (BOS);2 reps;30 secs;Limitations    Standing Eyes Closed Limitations EO2x, EC 2x with CGA/min A; most imbalance with EC/foam    Other Standing Exercises Comments R/L fwd/bck step with foot on airex 10x; march on foam x20 wiht CGA                PT Education -  07/24/21 1445     Education Details update to HEP- access code: [email or web address]; edu on 3 components of balance and importance of each    Person(s) Educated Patient    Methods Explanation;Demonstration;Tactile cues;Verbal cues;Handout    Comprehension Returned demonstration;Verbalized understanding              PT Short Term Goals - 07/18/21 1011       PT SHORT TERM GOAL #1   Title Patient to be independent with initial HEP.    Time 3    Period Weeks    Status Achieved    Target Date 08/05/21               PT Long Term Goals - 07/24/21 1448       PT LONG TERM GOAL #1   Title Patient to be independent with advanced HEP.    Time 6    Period Weeks    Status On-going      PT LONG TERM GOAL #2   Title Patient to report 0/10 dizziness with standing vertical and horizontal head movements at variable speeds.    Time 6    Period Weeks    Status On-going      PT LONG TERM GOAL #3   Title Patient will report 0/10 dizziness with bed mobility.    Time 6    Period Weeks    Status Achieved      PT LONG TERM GOAL #4   Title Patient to score at least 20/24 on DGI in order to decrease risk of falls.    Time 6    Period Weeks    Status On-going      PT LONG TERM GOAL #5   Title Patient to report 75% improvement in dizziness.    Time 6    Period Weeks    Status On-going                   Plan - 07/24/21 1445     Clinical Impression Statement Patient arrived to session with report of much improved dizziness since last  session; now noting resolution of dizziness with bed mobility. Still notes some remaining dizziness with head turns/nods. Reviewed HEP focusing on VOR training and habituation. Patient reported most dizziness remaining with head nods. Worked on static standing activities with vision and proprioception sense removed; patient demonstrated mild sway throughout and report of difficulty. Patient now asymptomatic with Kayla Khan despite trying added  challenges, thus removed this from HEP. Severe sway demonstrated with static activities without vision and proprioception sense. Plan to continue working on this in future sessions. Patient reported understanding of all edu provided today and without complaints at end of session.    Comorbidities anemia, asthma, depression with anxiety, HTN, GERD, HLD, angina    PT Treatment/Interventions ADLs/Self Care Home Management;Canalith Repostioning;Cryotherapy;DME Instruction;Moist Heat;Gait training;Stair training;Functional mobility training;Therapeutic activities;Balance training;Neuromuscular re-education;Manual techniques;Patient/family education;Passive range of motion;Energy conservation;Visual/perceptual remediation/compensation;Vestibular    PT Next Visit Plan progress VOR and sensory re-weighting training, balance    Consulted and Agree with Plan of Care Patient             Patient will benefit from skilled therapeutic intervention in order to improve the following deficits and impairments:  Abnormal gait, Decreased coordination, Decreased range of motion, Difficulty walking, Dizziness, Decreased activity tolerance, Decreased balance, Impaired flexibility, Improper body mechanics, Postural dysfunction, Decreased strength  Visit Diagnosis: BPPV (benign paroxysmal positional vertigo), right  Dizziness and giddiness  Unsteadiness on feet     Problem List Patient Active Problem List   Diagnosis Date Noted   Spinal stenosis of lumbar region without neurogenic claudication 07/23/2021   Neurological deficit present 06/26/2021   Paresthesia 05/22/2021   SOB (shortness of breath) on exertion 04/28/2021   GAD (generalized anxiety disorder) 04/07/2021   Neuropathy 02/17/2021   Primary osteoarthritis of left shoulder 08/19/2020   Atherosclerosis of aorta ([place]) 05/15/2020   Psychophysiological insomnia 05/15/2020   Moderate episode of recurrent major depressive disorder ([place]) 04/01/2020    Tinea cruris 02/06/2020   Visit for screening mammogram 12/14/2016   Dizziness 03/14/2016   Spinal stenosis of lumbar region with radiculopathy 04/18/2015   Osteopenia, senile 02/20/2015   Obesity (BMI 30.0-34.9) 03/22/2013   Routine general medical examination at a health care facility 10/19/2011   Prediabetes 07/05/2009   Hyperlipidemia with target LDL less than 130 12/11/2008   Iron deficiency anemia 05/03/2008   Essential hypertension 05/03/2008   Asthma 05/03/2008   GERD 05/03/2008   OSA (obstructive sleep apnea) 05/03/2008    Janene Harvey, PT, DPT 07/24/21 2:49 PM   [facility] [address], [place], [place], [address] Phone: [phone]   Fax:  [phone]  Name: Kayla Khan MRN: [id number] Date of Birth: 1951/03/23

## 2021-07-28 ENCOUNTER — Encounter: Payer: Self-pay | Admitting: Physical Therapy

## 2021-07-28 ENCOUNTER — Ambulatory Visit: Payer: Medicare PPO | Admitting: Physical Therapy

## 2021-07-28 ENCOUNTER — Other Ambulatory Visit: Payer: Self-pay

## 2021-07-28 DIAGNOSIS — H8111 Benign paroxysmal vertigo, right ear: Secondary | ICD-10-CM

## 2021-07-28 DIAGNOSIS — R42 Dizziness and giddiness: Secondary | ICD-10-CM | POA: Diagnosis not present

## 2021-07-28 DIAGNOSIS — R2681 Unsteadiness on feet: Secondary | ICD-10-CM

## 2021-07-28 NOTE — Therapy (Signed)
[facility] [address] [place], [place], [place], [address] Phone: [phone]   Fax:  [phone]  Physical Therapy Treatment  Patient Details  Name: Kayla Khan MRN: [id number] Date of Birth: 08-Sep-1950 Referring Provider (PT): Hoyt Koch, MD   Encounter Date: 07/28/2021   PT End of Session - 07/28/21 1449     Visit Number 4    Number of Visits 13    Date for PT Re-Evaluation 08/26/21    Authorization Type [employer]    Authorization Time Period 12 visits approved 11/08-12/20    Authorization - Visit Number 3    Authorization - Number of Visits 12    PT Start Time 0301    PT Stop Time 1443    PT Time Calculation (min) 41 min    Equipment Utilized During Treatment Gait belt    Activity Tolerance Patient tolerated treatment well    Behavior During Therapy [facility] for tasks assessed/performed             Past Medical History:  Diagnosis Date   Allergic conjunctivitis    Anemia, iron deficiency    Arthritis    Asthma    Chest tightness or pressure    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 h/o such - cor CT 03/2020 with minimal CAD, consider noncardiac causes of pain.   Palpitations    Pre-diabetes    Sleep apnea    Thyroid nodule    The patient had a thyroid nodule that was surgically removed in the past.  She has one half of her thyroid and does not take replacement of Taxol and he was benign    Past Surgical History:  Procedure Laterality Date   APPENDECTOMY     BREAST BIOPSY     CESAREAN SECTION     x2   GANGLION CYST  EXCISION     THYROIDECTOMY, PARTIAL     left   TUBAL LIGATION     VESICOVAGINAL FISTULA CLOSURE W/ TAH      There were no vitals filed for this visit.   Subjective Assessment - 07/28/21 1403     Subjective When getting up to put on jacket, she felt a brief spinning sensation. Had some dizziness afterwards when trying to do her HEP, but better today.    Pertinent History anemia, asthma, depression with anxiety, HTN, GERD, HLD, angina    Diagnostic tests 06/30/21 head CT: negative    Patient Stated Goals improve dizziness and nausea    Currently in Pain? No/denies                     Vestibular Assessment - 07/28/21 0001       Dix-Hallpike Right   Dix-Hallpike Right Duration 0    Dix-Hallpike Right Symptoms No nystagmus      Dix-Hallpike Left   Dix-Hallpike Left Duration 2 sec    Dix-Hallpike Left Symptoms Upbeat, left rotatory nystagmus   no dizziness; mild c/o dizziness on 2nd trial     Horizontal  Canal Right   Horizontal Canal Right Duration 0    Horizontal Canal Right Symptoms Normal      Horizontal Canal Left   Horizontal Canal Left Duration 0    Horizontal Canal Left Symptoms Normal                      [facility] Adult PT Treatment/Exercise - 07/28/21 0001       Neuro Re-ed    Neuro Re-ed Details  EC head turns 10x feet narrow, head nods 10x feet narrow             Vestibular Treatment/Exercise - 07/28/21 0001       Vestibular Treatment/Exercise   Canalith Repositioning Epley Manuever Left       EPLEY MANUEVER LEFT   Number of Reps  1    Overall Response  Improved Symptoms      X1 Viewing Horizontal   Foot Position standing, standing on foam    Reps 10   2x10   Comments 0/10 dizziness      X1 Viewing Vertical   Foot Position standing, standing on foam    Reps 10   2x10   Comments 1-2/10 dizziness                Balance Exercises - 07/28/21 0001       Balance Exercises: Standing   Standing Eyes Closed  Foam/compliant surface;Narrow base of support (BOS);30 secs;Limitations;2 reps   cues to shift anteriorly over toes d/t posteiror LOB   Tandem Gait Forward;2 reps   2x 62f with CGA   Other Standing Exercises ant/pos wt shift on foam 10x    Other Standing Exercises Comments R/L fwd/bck step with head nods to targets 10x with CGA-min A                PT Education - 07/28/21 1448     Education Details update to HEP- access code: [email or web address]    Person(s) Educated Patient    Methods Explanation;Demonstration;Tactile cues;Verbal cues;Handout    Comprehension Returned demonstration;Verbalized understanding              PT Short Term Goals - 07/18/21 1011       PT SHORT TERM GOAL #1   Title Patient to be independent with initial HEP.    Time 3    Period Weeks    Status Achieved    Target Date 08/05/21               PT Long Term Goals - 07/24/21 1448       PT LONG TERM GOAL #1   Title Patient to be independent with advanced HEP.    Time 6    Period Weeks    Status On-going      PT LONG TERM GOAL #2   Title Patient to report 0/10 dizziness with standing vertical and horizontal head movements at variable speeds.    Time 6    Period Weeks    Status On-going      PT LONG TERM GOAL #3   Title Patient will report 0/10 dizziness with bed mobility.    Time 6    Period Weeks    Status Achieved      PT LONG TERM GOAL #4   Title Patient to score at least 20/24 on DGI in order to decrease risk of falls.    Time 6    Period Weeks    Status On-going  PT LONG TERM GOAL #5   Title Patient to report 75% improvement in dizziness.    Time 6    Period Weeks    Status On-going                   Plan - 07/28/21 1449     Clinical Impression Statement Patient reports that when getting up to put her jacket on while at church she had a brief onset of spinning. Notes that later that day while performing her HEP she had increased unsteadiness which resolved  earlier today. Assessed positional testing which revealed very brief L upbeating torsional nystagmus in L DH. Proceeded with VOR and balance training. Able to progress standing VOR to foam surface d/t improved tolerance for these activities. Balance activities with vision removed proved to be most challenging today. Ended session with L Epley to address remaining positional vertigo; patient tolerated this maneuver well and without complaints at end of session.    Comorbidities anemia, asthma, depression with anxiety, HTN, GERD, HLD, angina    PT Treatment/Interventions ADLs/Self Care Home Management;Canalith Repostioning;Cryotherapy;DME Instruction;Moist Heat;Gait training;Stair training;Functional mobility training;Therapeutic activities;Balance training;Neuromuscular re-education;Manual techniques;Patient/family education;Passive range of motion;Energy conservation;Visual/perceptual remediation/compensation;Vestibular    PT Next Visit Plan progress VOR and sensory re-weighting training, balance    Consulted and Agree with Plan of Care Patient             Patient will benefit from skilled therapeutic intervention in order to improve the following deficits and impairments:  Abnormal gait, Decreased coordination, Decreased range of motion, Difficulty walking, Dizziness, Decreased activity tolerance, Decreased balance, Impaired flexibility, Improper body mechanics, Postural dysfunction, Decreased strength  Visit Diagnosis: BPPV (benign paroxysmal positional vertigo), right  Dizziness and giddiness  Unsteadiness on feet     Problem List Patient Active Problem List   Diagnosis Date Noted   Spinal stenosis of lumbar region without neurogenic claudication 07/23/2021   Neurological deficit present 06/26/2021   Paresthesia 05/22/2021   SOB (shortness of breath) on exertion 04/28/2021   GAD (generalized anxiety disorder) 04/07/2021   Neuropathy 02/17/2021   Primary osteoarthritis of left  shoulder 08/19/2020   Atherosclerosis of aorta ([place]) 05/15/2020   Psychophysiological insomnia 05/15/2020   Moderate episode of recurrent major depressive disorder ([place]) 04/01/2020   Tinea cruris 02/06/2020   Visit for screening mammogram 12/14/2016   Dizziness 03/14/2016   Spinal stenosis of lumbar region with radiculopathy 04/18/2015   Osteopenia, senile 02/20/2015   Obesity (BMI 30.0-34.9) 03/22/2013   Routine general medical examination at a health care facility 10/19/2011   Prediabetes 07/05/2009   Hyperlipidemia with target LDL less than 130 12/11/2008   Iron deficiency anemia 05/03/2008   Essential hypertension 05/03/2008   Asthma 05/03/2008   GERD 05/03/2008   OSA (obstructive sleep apnea) 05/03/2008     Janene Harvey, PT, DPT 07/28/21 2:54 PM   [facility] [address], [place], [place], [address] Phone: [phone]   Fax:  [phone]  Name: Kayla Khan MRN: [id number] Date of Birth: 05/22/1951

## 2021-07-30 ENCOUNTER — Ambulatory Visit: Payer: Self-pay | Admitting: Physical Therapy

## 2021-08-04 ENCOUNTER — Encounter: Payer: Self-pay | Admitting: Physical Therapy

## 2021-08-04 ENCOUNTER — Other Ambulatory Visit: Payer: Self-pay

## 2021-08-04 ENCOUNTER — Ambulatory Visit: Payer: Medicare PPO | Admitting: Physical Therapy

## 2021-08-04 DIAGNOSIS — R42 Dizziness and giddiness: Secondary | ICD-10-CM | POA: Diagnosis not present

## 2021-08-04 DIAGNOSIS — H8111 Benign paroxysmal vertigo, right ear: Secondary | ICD-10-CM

## 2021-08-04 DIAGNOSIS — R2681 Unsteadiness on feet: Secondary | ICD-10-CM | POA: Diagnosis not present

## 2021-08-04 NOTE — Therapy (Signed)
[facility] [place], [place], [place], [address] Phone: [phone]   Fax:  [phone]  Physical Therapy Treatment  Patient Details  Name: Kayla Khan MRN: [id number] Date of Birth: [birth year] Referring Provider (PT): Hoyt Koch, MD   Encounter Date: 08/04/2021   PT End of Session - 08/04/21 1616     Visit Number 5    Number of Visits 13    Date for PT Re-Evaluation 08/26/21    Authorization Type [employer]    Authorization Time Period 12 visits approved 11/08-12/20    Authorization - Visit Number 4    Authorization - Number of Visits 12    PT Start Time 1316    PT Stop Time 1357    PT Time Calculation (min) 41 min    Equipment Utilized During Treatment Gait belt    Activity Tolerance Patient tolerated treatment well    Behavior During Therapy [facility] for tasks assessed/performed             Past Medical History:  Diagnosis Date   Allergic conjunctivitis    Anemia, iron deficiency    Arthritis    Asthma    Chest tightness or pressure    Presumed microvascular angina,, 2004 normal coronary arteries / nuclear December, 2008, normal   Depression with anxiety    Diverticulosis of colon    DJD (degenerative joint disease)    Ejection fraction    70%, catheter 2004 / normal LV function nuclear, 2008   Essential hypertension    Fatigue    GERD (gastroesophageal reflux disease)    HLA B27 positive    HLD (hyperlipidemia)    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 h/o such - cor CT 03/2020 with minimal CAD, consider noncardiac causes of pain.   Palpitations    Pre-diabetes    Sleep apnea    Thyroid nodule    The patient had a thyroid nodule that was surgically removed in the past.  She has one half of her thyroid and does not take replacement of Taxol and he was benign    Past Surgical History:  Procedure Laterality Date   APPENDECTOMY     BREAST BIOPSY     CESAREAN SECTION     x2   GANGLION CYST  EXCISION     THYROIDECTOMY, PARTIAL     left   TUBAL LIGATION     VESICOVAGINAL FISTULA CLOSURE W/ TAH      There were no vitals filed for this visit.   Subjective Assessment - 08/04/21 1317     Subjective Had to use Meclizine one day over the holiday. Has been working on the tandem walk which is the most challenging exercise. No dizziness at rest.    Pertinent History anemia, asthma, depression with anxiety, HTN, GERD, HLD, angina    Diagnostic tests 06/30/21 head CT: negative    Patient Stated Goals improve dizziness and nausea    Currently in Pain? No/denies                               [facility] Adult PT Treatment/Exercise - 08/04/21 0001       Neuro Re-ed    Neuro Re-ed Details  R/L fwd/back step + head nods to targets 10x each;             Vestibular Treatment/Exercise - 08/04/21 0001       Vestibular  Treatment/Exercise   Gaze Exercises X2 Viewing Vertical;X2 Viewing Horizontal      X1 Viewing Horizontal   Foot Position standing on foam    Reps 10   2x10   Comments 0/10 dizziness; mild unsteadiness when prompted to increase speed      X1 Viewing Vertical   Foot Position standing on foam    Reps 10   2x10   Comments 0/10 dizziness; mild unsteadiness      X2 Viewing Horizontal   Foot Position standing    Reps --   2x10    Comments PT assisting d/t trouble with coordination/gaze fixation      X2 Viewing Vertical   Foot Position standing    Reps --   2x10   Comments PT assisting d/t trouble with coordination/gaze fixation                Balance Exercises - 08/04/21 0001       Balance Exercises: Standing   Standing Eyes Opened Narrow base of support (BOS);Foam/compliant surface;2 reps;30 secs;Limitations    Standing Eyes Opened Limitations 30" EO, 3x30" EC with nearly romberg d/t improved stability like this    Standing Eyes Closed Foam/compliant surface;Narrow base of support (BOS);Limitations    Standing Eyes Closed  Limitations EC 10x with nods and turns with CGA/min A   no dizziness; mild unsteadiness   Wall Bumps Shoulder;Hip;Eyes opened;Eyes closed;5 reps;Limitations    Wall Bumps Limitations difficulty performing hip strategy when standing further from wall                PT Education - 08/04/21 1615     Education Details update to HEP- access code: [email or web address]    Person(s) Educated Patient    Methods Explanation;Demonstration;Tactile cues;Verbal cues;Handout    Comprehension Verbalized understanding;Returned demonstration              PT Short Term Goals - 07/18/21 1011       PT SHORT TERM GOAL #1   Title Patient to be independent with initial HEP.    Time 3    Period Weeks    Status Achieved    Target Date 08/05/21               PT Long Term Goals - 07/24/21 1448       PT LONG TERM GOAL #1   Title Patient to be independent with advanced HEP.    Time 6    Period Weeks    Status On-going      PT LONG TERM GOAL #2   Title Patient to report 0/10 dizziness with standing vertical and horizontal head movements at variable speeds.    Time 6    Period Weeks    Status On-going      PT LONG TERM GOAL #3   Title Patient will report 0/10 dizziness with bed mobility.    Time 6    Period Weeks    Status Achieved      PT LONG TERM GOAL #4   Title Patient to score at least 20/24 on DGI in order to decrease risk of falls.    Time 6    Period Weeks    Status On-going      PT LONG TERM GOAL #5   Title Patient to report 75% improvement in dizziness.    Time 6    Period Weeks    Status On-going  Plan - 08/04/21 1616     Clinical Impression Statement Patient reporting improving dizziness and report of continued unsteadiness with tandem walking activities. Reviewed most recently updated HEP, providing intermittent cues to increase speed of head movement. Patient now asymptomatic with VOR on foam. Still demonstrating mild sway with balance  exercises with eyes closed. Worked on dynamic balance activities with no c/o dizziness but reporting feeling of unsteadiness. Able to update HEP today with new exercises d/t continued improvement in tolerance for activities. Plan for possible wrap up at next session. Patient reported understanding and without complaints at end of session.    Comorbidities anemia, asthma, depression with anxiety, HTN, GERD, HLD, angina    PT Treatment/Interventions ADLs/Self Care Home Management;Canalith Repostioning;Cryotherapy;DME Instruction;Moist Heat;Gait training;Stair training;Functional mobility training;Therapeutic activities;Balance training;Neuromuscular re-education;Manual techniques;Patient/family education;Passive range of motion;Energy conservation;Visual/perceptual remediation/compensation;Vestibular    PT Next Visit Plan progress VOR and sensory re-weighting training, balance    Consulted and Agree with Plan of Care Patient             Patient will benefit from skilled therapeutic intervention in order to improve the following deficits and impairments:  Abnormal gait, Decreased coordination, Decreased range of motion, Difficulty walking, Dizziness, Decreased activity tolerance, Decreased balance, Impaired flexibility, Improper body mechanics, Postural dysfunction, Decreased strength  Visit Diagnosis: BPPV (benign paroxysmal positional vertigo), right  Dizziness and giddiness  Unsteadiness on feet     Problem List Patient Active Problem List   Diagnosis Date Noted   Spinal stenosis of lumbar region without neurogenic claudication 07/23/2021   Neurological deficit present 06/26/2021   Paresthesia 05/22/2021   SOB (shortness of breath) on exertion 04/28/2021   GAD (generalized anxiety disorder) 04/07/2021   Neuropathy 02/17/2021   Primary osteoarthritis of left shoulder 08/19/2020   Atherosclerosis of aorta ([place]) 05/15/2020   Psychophysiological insomnia 05/15/2020   Moderate episode  of recurrent major depressive disorder ([place]) 04/01/2020   Tinea cruris 02/06/2020   Visit for screening mammogram 12/14/2016   Dizziness 03/14/2016   Spinal stenosis of lumbar region with radiculopathy 04/18/2015   Osteopenia, senile 02/20/2015   Obesity (BMI 30.0-34.9) 03/22/2013   Routine general medical examination at a health care facility 10/19/2011   Prediabetes 07/05/2009   Hyperlipidemia with target LDL less than 130 12/11/2008   Iron deficiency anemia 05/03/2008   Essential hypertension 05/03/2008   Asthma 05/03/2008   GERD 05/03/2008   OSA (obstructive sleep apnea) 05/03/2008     Janene Harvey, PT, DPT 08/04/21 4:18 PM   [facility] [address] [place], [place], [address] Phone: [phone]   Fax:  [phone]  Name: Kayla Khan MRN: [id number] Date of Birth: 08/25/1951

## 2021-08-07 ENCOUNTER — Ambulatory Visit: Payer: Self-pay | Admitting: Physical Therapy

## 2021-08-14 ENCOUNTER — Other Ambulatory Visit: Payer: Self-pay

## 2021-08-14 ENCOUNTER — Encounter: Payer: Self-pay | Admitting: Physical Therapy

## 2021-08-14 ENCOUNTER — Ambulatory Visit: Payer: Medicare PPO | Attending: Internal Medicine | Admitting: Physical Therapy

## 2021-08-14 DIAGNOSIS — R2681 Unsteadiness on feet: Secondary | ICD-10-CM | POA: Diagnosis not present

## 2021-08-14 DIAGNOSIS — R42 Dizziness and giddiness: Secondary | ICD-10-CM | POA: Insufficient documentation

## 2021-08-14 DIAGNOSIS — H8111 Benign paroxysmal vertigo, right ear: Secondary | ICD-10-CM | POA: Insufficient documentation

## 2021-08-14 NOTE — Patient Instructions (Signed)
Access Code: [email or web address] URL: [email or web address] Date: 08/14/2021 Prepared by: [facility]  Exercises Romberg Stance on Foam Pad - 1 x daily - 3 x weekly - 3 reps - 30 sec hold Standing Toe Taps - 1 x daily - 3 x weekly - 2 sets - 10 reps Forward and Backward Step Over with Counter Support - 1 x daily - 3 x weekly - 2 sets - 10 reps Seated Gaze Stabilization with Head Nod and Vertical Arm Movement - 1 x daily - 3 x weekly - 2 sets - 10 reps Standing Gaze Stabilization with Head Rotation and Horizontal Arm Movement - 1 x daily - 3 x weekly - 2 sets - 10 reps Walking with Head Nod - 1 x daily - 3 x weekly - 2 sets - 10 reps

## 2021-08-14 NOTE — Therapy (Addendum)
[facility] [address] [place], [place], [address] Phone: [phone]   Fax:  [phone]  Physical Therapy Discharge Summary  Patient Details  Name: Kayla Khan MRN: [id number] Date of Birth: 1951/09/03 Referring Provider (PT): Hoyt Koch, MD   Progress Note Reporting Period 07/15/21 to 08/14/21  See note below for Objective Data and Assessment of Progress/Goals.     Encounter Date: 08/14/2021   PT End of Session - 08/14/21 1439     Visit Number 6    Number of Visits 13    Date for PT Re-Evaluation 08/26/21    Authorization Type [employer]    Authorization Time Period 12 visits approved 11/08-12/20    Authorization - Visit Number 5    Authorization - Number of Visits 12    PT Start Time 6222    PT Stop Time 1438    PT Time Calculation (min) 33 min    Equipment Utilized During Treatment Gait belt    Activity Tolerance Patient tolerated treatment well    Behavior During Therapy WFL for tasks assessed/performed             Past Medical History:  Diagnosis Date   Allergic conjunctivitis    Anemia, iron deficiency    Arthritis    Asthma    Chest tightness or pressure    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 h/o such - cor CT 03/2020 with minimal CAD, consider noncardiac causes of pain.   Palpitations    Pre-diabetes    Sleep apnea    Thyroid nodule    The patient had a thyroid nodule that was surgically removed in the past.  She has one half of her thyroid and does not take replacement of Taxol and he was benign     Past Surgical History:  Procedure Laterality Date   APPENDECTOMY     BREAST BIOPSY     CESAREAN SECTION     x2   GANGLION CYST EXCISION     THYROIDECTOMY, PARTIAL     left   TUBAL LIGATION     VESICOVAGINAL FISTULA CLOSURE W/ TAH      There were no vitals filed for this visit.   Subjective Assessment - 08/14/21 1406     Subjective Still doing great. Feels ready to wrap up today. Reports 95% improvement in dizziness. Reports that sometimes when she turns her head, she may get a little bit of dizziness. Hurt her R heel while walking on the treadmill and that seems to make her limp.    Pertinent History anemia, asthma, depression with anxiety, HTN, GERD, HLD, angina    Diagnostic tests 06/30/21 head CT: negative    Patient Stated Goals improve dizziness and nausea    Currently in Pain? No/denies                [facility] PT Assessment - 08/14/21 0001       Dynamic Gait Index   Level Surface Normal    Change in Gait Speed Normal  Gait with Horizontal Head Turns Normal    Gait with Vertical Head Turns Normal    Gait and Pivot Turn Normal    Step Over Obstacle Normal    Step Around Obstacles Severe Impairment    Steps Mild Impairment   mild LOB   Total Score 20                           [facility] Adult PT Treatment/Exercise - 08/14/21 0001       Neuro Re-ed    Neuro Re-ed Details  R/L fwd/back step + head nods to targets 10x each; alt toe tap on cone standing on foam 10x with CGA; step forward/back over cone 10x             Vestibular Treatment/Exercise - 08/14/21 0001       Vestibular Treatment/Exercise   Habituation Exercises Comment   simulating bed mobility including sit<>supine, R/L roll with 0/10 dizziness     X2 Viewing Horizontal   Foot Position standing    Reps 10     Comments c/o 0/10 dizziness; good fixation      X2 Viewing Vertical   Foot Position standing    Reps 10    Comments c/o 2/10 dizziness; good fixation                     PT Education - 08/14/21 1439     Education Details discussion on objective progress and remaining impairments; update to HEP- access code: [email or web address]    Person(s) Educated Patient    Methods Explanation;Demonstration;Tactile cues;Verbal cues;Handout    Comprehension Returned demonstration;Verbalized understanding              PT Short Term Goals - 08/14/21 1442       PT SHORT TERM GOAL #1   Title Patient to be independent with initial HEP.    Time 3    Period Weeks    Status Achieved    Target Date 08/05/21               PT Long Term Goals - 08/14/21 1442       PT LONG TERM GOAL #1   Title Patient to be independent with advanced HEP.    Time 6    Period Weeks    Status Achieved      PT LONG TERM GOAL #2   Title Patient to report 0/10 dizziness with standing vertical and horizontal head movements at variable speeds.    Time 6    Period Weeks    Status Partially Met   0-2/10 dizziness     PT LONG TERM GOAL #3   Title Patient will report 0/10 dizziness with bed mobility.    Time 6    Period Weeks    Status Achieved      PT LONG TERM GOAL #4   Title Patient to score at least 20/24 on DGI in order to decrease risk of falls.    Time 6    Period Weeks    Status Achieved      PT LONG TERM GOAL #5   Title Patient to report 75% improvement in dizziness.    Time 6    Period Weeks    Status Achieved                   Plan - 08/14/21 1440     Clinical Impression Statement Patient arrived to session  with report of 95% improvement in dizziness. Notes some remaining dizziness when turning head intermittently. Notes that she did hurt her R heel recently, which is making her limp. Now reports 0-2/10 dizziness with VOR activities and 0/10 dizziness with bed mobility. Patient scored 20/24 on DGI, indicating a decreased risk of falls and improved since eval. Reviewed HEP and provided exercises to continue working on balance. Patient reported  understanding of these activities and without complaints at end of session. Patient has demonstrated progress towards goals and is ready for DC.    Comorbidities anemia, asthma, depression with anxiety, HTN, GERD, HLD, angina    PT Treatment/Interventions ADLs/Self Care Home Management;Canalith Repostioning;Cryotherapy;DME Instruction;Moist Heat;Gait training;Stair training;Functional mobility training;Therapeutic activities;Balance training;Neuromuscular re-education;Manual techniques;Patient/family education;Passive range of motion;Energy conservation;Visual/perceptual remediation/compensation;Vestibular    PT Next Visit Plan DC at this time    Consulted and Agree with Plan of Care Patient             Patient will benefit from skilled therapeutic intervention in order to improve the following deficits and impairments:  Abnormal gait, Decreased coordination, Decreased range of motion, Difficulty walking, Dizziness, Decreased activity tolerance, Decreased balance, Impaired flexibility, Improper body mechanics, Postural dysfunction, Decreased strength  Visit Diagnosis: BPPV (benign paroxysmal positional vertigo), right  Dizziness and giddiness  Unsteadiness on feet     Problem List Patient Active Problem List   Diagnosis Date Noted   Spinal stenosis of lumbar region without neurogenic claudication 07/23/2021   Neurological deficit present 06/26/2021   Paresthesia 05/22/2021   SOB (shortness of breath) on exertion 04/28/2021   GAD (generalized anxiety disorder) 04/07/2021   Neuropathy 02/17/2021   Primary osteoarthritis of left shoulder 08/19/2020   Atherosclerosis of aorta ([place]) 05/15/2020   Psychophysiological insomnia 05/15/2020   Moderate episode of recurrent major depressive disorder ([place]) 04/01/2020   Tinea cruris 02/06/2020   Visit for screening mammogram 12/14/2016   Dizziness 03/14/2016   Spinal stenosis of lumbar region with radiculopathy 04/18/2015   Osteopenia,  senile 02/20/2015   Obesity (BMI 30.0-34.9) 03/22/2013   Routine general medical examination at a health care facility 10/19/2011   Prediabetes 07/05/2009   Hyperlipidemia with target LDL less than 130 12/11/2008   Iron deficiency anemia 05/03/2008   Essential hypertension 05/03/2008   Asthma 05/03/2008   GERD 05/03/2008   OSA (obstructive sleep apnea) 05/03/2008     PHYSICAL THERAPY DISCHARGE SUMMARY  Visits from Start of Care: 6  Current functional level related to goals / functional outcomes: See above clinical impression   Remaining deficits: Dizziness with VOR   Education / Equipment: HEP  Plan: Patient agrees to discharge.  Patient goals were partially met. Patient is being discharged due to meeting the stated rehab goals.      Janene Harvey, PT, DPT 08/14/21 2:45 PM   [facility] [place] [address] [place], [place], [address] Phone: [phone]   Fax:  [phone]  Name: Kayla Khan MRN: [id number] Date of Birth: 1951/07/11

## 2021-08-25 DIAGNOSIS — M7661 Achilles tendinitis, right leg: Secondary | ICD-10-CM | POA: Diagnosis not present

## 2021-09-02 ENCOUNTER — Encounter: Payer: Self-pay | Admitting: Internal Medicine

## 2021-09-02 ENCOUNTER — Telehealth: Payer: Self-pay | Admitting: Internal Medicine

## 2021-09-02 ENCOUNTER — Other Ambulatory Visit: Payer: Self-pay

## 2021-09-02 ENCOUNTER — Ambulatory Visit: Payer: Medicare PPO | Admitting: Internal Medicine

## 2021-09-02 ENCOUNTER — Other Ambulatory Visit: Payer: Self-pay | Admitting: Internal Medicine

## 2021-09-02 VITALS — BP 162/86 | HR 91 | Temp 98.1°F | Ht 64.0 in | Wt 184.0 lb

## 2021-09-02 DIAGNOSIS — R413 Other amnesia: Secondary | ICD-10-CM | POA: Insufficient documentation

## 2021-09-02 DIAGNOSIS — F5104 Psychophysiologic insomnia: Secondary | ICD-10-CM | POA: Diagnosis not present

## 2021-09-02 DIAGNOSIS — I10 Essential (primary) hypertension: Secondary | ICD-10-CM | POA: Diagnosis not present

## 2021-09-02 MED ORDER — INDAPAMIDE 1.25 MG PO TABS: 1.2500 mg | ORAL_TABLET | Freq: Every day | ORAL | 0 refills | Status: DC

## 2021-09-02 NOTE — Telephone Encounter (Signed)
Patient states she think she had a mini stroke on 08-31-2021, patient states she did not go to the ed  Attempted to transfer patient to team health, team health was closed  Transferred patient to the Kayla Khan advised patient to go to ER  Patient understood recommendation

## 2021-09-02 NOTE — Progress Notes (Signed)
Subjective:  Patient ID: Kayla Khan, female    DOB: [birth year]  Age: 70 y.o. MRN: [id number]  CC: Hypertension  This visit occurred during the SARS-CoV-2 public health emergency.  Safety protocols were in place, including screening questions prior to the visit, additional usage of staff PPE, and extensive cleaning of exam room while observing appropriate contact time as indicated for disinfecting solutions.    HPI Kayla Khan presents for f/up -   She had an episode of confusion 2 days ago.  She was on her way to church when she experienced the acute onset of confusion and had trouble processing numbers.  She went on to church and was able to sing the hymns.  The episode lasted about 4 to 5 hours and then resolved with no lingering symptoms.  She is taking prednisone for musculoskeletal pain.  She tells me her blood pressure has not been well controlled so she has added an ARB to the CCB.  She denies headache, blurred vision, paresthesias, slurred speech, ataxia, chest pain, shortness of breath, or edema.  She feels stressed and complains of insomnia.  Outpatient Medications Prior to Visit  Medication Sig Dispense Refill   ALPRAZolam (XANAX) 0.25 MG tablet Take 1 tablet (0.25 mg total) by mouth 2 (two) times daily as needed. 35 tablet 2   amLODipine (NORVASC) 5 MG tablet TAKE 1 TABLET(5 MG) BY MOUTH DAILY 90 tablet 1   Arginine 1000 MG TABS Take 3,000 mg by mouth in the morning and at bedtime.     Ascorbic Acid (VITAMIN C) 1000 MG tablet Take 1,000 mg by mouth daily.     aspirin EC 81 MG tablet Take 81 mg by mouth daily.     Cholecalciferol (VITAMIN D) 50 MCG (2000 UT) tablet Take 2,000 Units by mouth daily.     clobetasol ointment (TEMOVATE) 1.82 % Apply 1 application topically 2 (two) times daily. 30 g 2   co-enzyme Q-10 30 MG capsule Take 100 mg by mouth daily.     conjugated estrogens (PREMARIN) vaginal cream Place 1 Applicatorful vaginally 2 (two) times a week.      famotidine  (PEPCID) 40 MG tablet Take 1 tablet (40 mg total) by mouth at bedtime. 90 tablet 1   fish oil-omega-3 fatty acids 1000 MG capsule Take 3 g by mouth daily.     meclizine (ANTIVERT) 25 MG tablet Take 25 mg by mouth 3 (three) times daily as needed for dizziness.     nystatin-triamcinolone ointment (MYCOLOG) Apply 1 application topically as needed (yeast infection).     pravastatin (PRAVACHOL) 40 MG tablet TAKE 1 TABLET(40 MG) BY MOUTH EVERY EVENING 90 tablet 2   Suvorexant (BELSOMRA) 15 MG TABS Take 1 tablet by mouth at bedtime as needed. 30 tablet 2   No facility-administered medications prior to visit.    ROS Review of Systems  Constitutional:  Negative for chills, diaphoresis, fatigue and fever.  HENT: Negative.  Negative for sinus pressure and trouble swallowing.   Eyes:  Negative for visual disturbance.  Respiratory:  Negative for cough, chest tightness, shortness of breath and wheezing.   Cardiovascular:  Negative for chest pain, palpitations and leg swelling.  Gastrointestinal:  Negative for abdominal pain, constipation, diarrhea, nausea and vomiting.  Genitourinary: Negative.  Negative for decreased urine volume, difficulty urinating, dysuria, hematuria and urgency.  Musculoskeletal: Negative.  Negative for arthralgias and neck stiffness.  Skin: Negative.   Neurological:  Positive for dizziness. Negative for tremors, seizures, syncope, facial  asymmetry, speech difficulty, weakness, light-headedness, numbness and headaches.  Hematological:  Negative for adenopathy. Does not bruise/bleed easily.  Psychiatric/Behavioral:  Positive for confusion and sleep disturbance. Negative for agitation, behavioral problems, decreased concentration, dysphoric mood and suicidal ideas. The patient is nervous/anxious.    Objective:  BP (!) 162/86 (BP Location: Left Arm, Patient Position: Sitting, Cuff Size: Large)    Pulse 91    Temp 98.1 F (36.7 C) (Oral)    Ht 5\' 4"  (1.626 m)    Wt 184 lb (83.5 kg)     SpO2 95%    BMI 31.58 kg/m   BP Readings from Last 3 Encounters:  09/02/21 (!) 162/86  07/08/21 122/80  06/26/21 128/80    Wt Readings from Last 3 Encounters:  09/02/21 184 lb (83.5 kg)  07/08/21 178 lb 12.8 oz (81.1 kg)  06/26/21 179 lb 12.8 oz (81.6 kg)    Physical Exam Vitals reviewed.  Constitutional:      Appearance: Normal appearance.  HENT:     Nose: Nose normal.     Mouth/Throat:     Mouth: Mucous membranes are moist.  Eyes:     General: No scleral icterus.    Extraocular Movements: Extraocular movements intact.     Pupils: Pupils are equal, round, and reactive to light.  Cardiovascular:     Rate and Rhythm: Normal rate and regular rhythm.     Heart sounds: No murmur heard. Pulmonary:     Effort: Pulmonary effort is normal.     Breath sounds: No stridor. No wheezing, rhonchi or rales.  Abdominal:     General: Abdomen is protuberant. Bowel sounds are normal. There is no distension.     Palpations: Abdomen is soft. There is no hepatomegaly, splenomegaly or mass.  Musculoskeletal:     Cervical back: Neck supple.  Lymphadenopathy:     Cervical: No cervical adenopathy.  Neurological:     General: No focal deficit present.     Mental Status: She is alert.     Cranial Nerves: Cranial nerves 2-12 are intact.     Sensory: Sensation is intact.     Motor: Motor function is intact. No weakness, atrophy or abnormal muscle tone.     Coordination: Coordination is intact. Romberg sign negative. Coordination normal. Finger-Nose-Finger Test and Heel to [facility] Test normal. Rapid alternating movements normal.     Gait: Gait is intact. Gait normal.     Deep Tendon Reflexes: Reflexes normal. Babinski sign absent on the right side. Babinski sign absent on the left side.     Reflex Scores:      Tricep reflexes are 0 on the right side and 0 on the left side.      Bicep reflexes are 1+ on the right side and 1+ on the left side.      Brachioradialis reflexes are 1+ on the right side  and 1+ on the left side.      Patellar reflexes are 2+ on the right side and 2+ on the left side.      Achilles reflexes are 0 on the right side and 0 on the left side.   Lab Results  Component Value Date   WBC 6.6 04/28/2021   HGB 13.7 04/28/2021   HCT 39.8 04/28/2021   PLT 257.0 04/28/2021   GLUCOSE 113 (H) 04/28/2021   CHOL 133 07/21/2021   TRIG 59 07/21/2021   HDL 49 07/21/2021   LDLDIRECT 92.0 12/15/2016   LDLCALC 71 07/21/2021   ALT 16  07/21/2021   AST 17 07/21/2021   NA 139 04/28/2021   K 4.4 04/28/2021   CL 104 04/28/2021   CREATININE 0.95 04/28/2021   BUN 25 (H) 04/28/2021   CO2 25 04/28/2021   TSH 1.840 05/22/2021   HGBA1C 5.8 (H) 05/22/2021    CT HEAD WO CONTRAST (5MM)  Result Date: 06/30/2021 CLINICAL DATA:  Dizziness, neurologic deficit EXAM: CT HEAD WITHOUT CONTRAST TECHNIQUE: Contiguous axial images were obtained from the base of the skull through the vertex without intravenous contrast. COMPARISON:  None. FINDINGS: Brain: No evidence of acute infarction, hemorrhage, hydrocephalus, extra-axial collection or mass lesion/mass effect. Vascular: No hyperdense vessel or unexpected calcification. Skull: Normal. Negative for fracture or focal lesion. Sinuses/Orbits: No acute finding. Other: None IMPRESSION: Negative Electronically Signed   By: Lucrezia Europe M.D.   On: 06/30/2021 15:05    Assessment & Plan:   Kayla Khan was seen today for hypertension.  Diagnoses and all orders for this visit:  Transient amnesia- She has recovered completely.  Her neuro exam is reassuring.  I recommended an MRI to see if she has had a CVA. -     MR Brain Wo Contrast; Future  Essential hypertension- Her blood pressure is not adequately well controlled.  Will add indapamide to the CCB and ARB. -     indapamide (LOZOL) 1.25 MG tablet; Take 1 tablet (1.25 mg total) by mouth daily. -     telmisartan (MICARDIS) 40 MG tablet; Take 1 tablet (40 mg total) by mouth daily.  Psychophysiological  insomnia -     Suvorexant (BELSOMRA) 15 MG TABS; Take 1 tablet by mouth at bedtime as needed.   I am having Kayla Khan start on indapamide. I am also having her maintain her fish oil-omega-3 fatty acids, conjugated estrogens, aspirin EC, co-enzyme Q-10, vitamin C, clobetasol ointment, Vitamin D, nystatin-triamcinolone ointment, Arginine, ALPRAZolam, famotidine, amLODipine, meclizine, pravastatin, Belsomra, and telmisartan.  Meds ordered this encounter  Medications   indapamide (LOZOL) 1.25 MG tablet    Sig: Take 1 tablet (1.25 mg total) by mouth daily.    Dispense:  90 tablet    Refill:  0   Suvorexant (BELSOMRA) 15 MG TABS    Sig: Take 1 tablet by mouth at bedtime as needed.    Dispense:  30 tablet    Refill:  5   telmisartan (MICARDIS) 40 MG tablet    Sig: Take 1 tablet (40 mg total) by mouth daily.    Dispense:  90 tablet    Refill:  1     Follow-up: Return in about 3 months (around 12/01/2021).  Scarlette Calico, MD

## 2021-09-02 NOTE — Patient Instructions (Signed)

## 2021-09-02 NOTE — Telephone Encounter (Signed)
Spoke with pt and she has stated she believes she had a TIA on 12/25. Pt has stated she thinks is cleared up. However, she wanted to be seen.   **Pt stated her sxs were confusion, couldn't remember what time to go to son's house for dinner that night nor her medications to take when she went to look at them for taking. Also could not remember anyones telephone numbers.  **Pt states she fell 08/30/2021 did not hit her head, but states she fell pretty hard and bruised her hands. Pt also states she is also taking prednisone.  **pt was informed that if she notices any changes before her apptmnt today 09/02/2021 at 1:20 with Dr. Ronnald Ramp to call 911.

## 2021-09-03 MED ORDER — BELSOMRA 15 MG PO TABS: 1.0000 | ORAL_TABLET | Freq: Every evening | ORAL | 5 refills | Status: DC | PRN

## 2021-09-03 MED ORDER — TELMISARTAN 40 MG PO TABS: 40.0000 mg | ORAL_TABLET | Freq: Every day | ORAL | 1 refills | Status: DC

## 2021-09-04 ENCOUNTER — Encounter: Payer: Self-pay | Admitting: Internal Medicine

## 2021-09-11 ENCOUNTER — Encounter: Payer: Self-pay | Admitting: Internal Medicine

## 2021-09-11 ENCOUNTER — Ambulatory Visit
Admission: RE | Admit: 2021-09-11 | Discharge: 2021-09-11 | Disposition: A | Discharge status: Home / Self Care | Payer: Medicare PPO | Source: Ambulatory Visit | Attending: Internal Medicine | Admitting: Internal Medicine

## 2021-09-11 ENCOUNTER — Other Ambulatory Visit: Payer: Self-pay

## 2021-09-11 DIAGNOSIS — S86011D Strain of right Achilles tendon, subsequent encounter: Secondary | ICD-10-CM | POA: Diagnosis not present

## 2021-09-11 DIAGNOSIS — R413 Other amnesia: Secondary | ICD-10-CM

## 2021-09-11 DIAGNOSIS — I6782 Cerebral ischemia: Secondary | ICD-10-CM | POA: Diagnosis not present

## 2021-09-11 DIAGNOSIS — R42 Dizziness and giddiness: Secondary | ICD-10-CM | POA: Diagnosis not present

## 2021-09-15 DIAGNOSIS — S86011D Strain of right Achilles tendon, subsequent encounter: Secondary | ICD-10-CM | POA: Diagnosis not present

## 2021-09-17 DIAGNOSIS — S86011D Strain of right Achilles tendon, subsequent encounter: Secondary | ICD-10-CM | POA: Diagnosis not present

## 2021-09-22 DIAGNOSIS — S86011D Strain of right Achilles tendon, subsequent encounter: Secondary | ICD-10-CM | POA: Diagnosis not present

## 2021-09-25 DIAGNOSIS — S86011D Strain of right Achilles tendon, subsequent encounter: Secondary | ICD-10-CM | POA: Diagnosis not present

## 2021-09-29 DIAGNOSIS — S86011D Strain of right Achilles tendon, subsequent encounter: Secondary | ICD-10-CM | POA: Diagnosis not present

## 2021-10-02 DIAGNOSIS — S86011D Strain of right Achilles tendon, subsequent encounter: Secondary | ICD-10-CM | POA: Diagnosis not present

## 2021-10-06 DIAGNOSIS — S86011D Strain of right Achilles tendon, subsequent encounter: Secondary | ICD-10-CM | POA: Diagnosis not present

## 2021-10-09 DIAGNOSIS — S86011D Strain of right Achilles tendon, subsequent encounter: Secondary | ICD-10-CM | POA: Diagnosis not present

## 2021-10-10 DIAGNOSIS — S86011D Strain of right Achilles tendon, subsequent encounter: Secondary | ICD-10-CM | POA: Diagnosis not present

## 2021-10-15 DIAGNOSIS — S86011D Strain of right Achilles tendon, subsequent encounter: Secondary | ICD-10-CM | POA: Diagnosis not present

## 2021-10-17 DIAGNOSIS — S86011D Strain of right Achilles tendon, subsequent encounter: Secondary | ICD-10-CM | POA: Diagnosis not present

## 2021-10-20 DIAGNOSIS — S86011D Strain of right Achilles tendon, subsequent encounter: Secondary | ICD-10-CM | POA: Diagnosis not present

## 2021-10-23 ENCOUNTER — Other Ambulatory Visit: Payer: Self-pay | Admitting: Internal Medicine

## 2021-10-23 DIAGNOSIS — K2100 Gastro-esophageal reflux disease with esophagitis, without bleeding: Secondary | ICD-10-CM

## 2021-10-23 DIAGNOSIS — I10 Essential (primary) hypertension: Secondary | ICD-10-CM

## 2021-10-23 DIAGNOSIS — I700 Atherosclerosis of aorta: Secondary | ICD-10-CM

## 2021-10-24 DIAGNOSIS — S86011D Strain of right Achilles tendon, subsequent encounter: Secondary | ICD-10-CM | POA: Diagnosis not present

## 2021-10-25 NOTE — Progress Notes (Signed)
Subjective:    Patient ID: Kayla Khan, female    DOB: 1950/11/23, 71 y.o.   MRN: [id number]  This visit occurred during the SARS-CoV-2 public health emergency.  Safety protocols were in place, including screening questions prior to the visit, additional usage of staff PPE, and extensive cleaning of exam room while observing appropriate contact time as indicated for disinfecting solutions.    HPI The patient is here for an acute visit.   Rash on abdomen x 1 month - last week it has gotten worse.  It itches badly.  She has tried nystatin/triamcinolone cream x 2 weeks - did not help.  She also tried clotrimazole, which was expired, and it did not help.    She states this happened to her in the past a few years ago and she did need to take the oral medication and an antibiotic because it had become infected.  She is really worried about it being infected at this time.   Medications and allergies reviewed with patient and updated if appropriate.  Patient Active Problem List   Diagnosis Date Noted   Transient amnesia 09/02/2021   Spinal stenosis of lumbar region without neurogenic claudication 07/23/2021   Neurological deficit present 06/26/2021   Paresthesia 05/22/2021   SOB (shortness of breath) on exertion 04/28/2021   GAD (generalized anxiety disorder) 04/07/2021   Neuropathy 02/17/2021   Primary osteoarthritis of left shoulder 08/19/2020   Atherosclerosis of aorta ([place]) 05/15/2020   Psychophysiological insomnia 05/15/2020   Moderate episode of recurrent major depressive disorder ([place]) 04/01/2020   Tinea cruris 02/06/2020   Visit for screening mammogram 12/14/2016   Dizziness 03/14/2016   Spinal stenosis of lumbar region with radiculopathy 04/18/2015   Osteopenia, senile 02/20/2015   Obesity (BMI 30.0-34.9) 03/22/2013   Routine general medical examination at a health care facility 10/19/2011   Prediabetes 07/05/2009   Hyperlipidemia with target LDL less than 130  12/11/2008   Iron deficiency anemia 05/03/2008   Essential hypertension 05/03/2008   Asthma 05/03/2008   GERD 05/03/2008   OSA (obstructive sleep apnea) 05/03/2008    Current Outpatient Medications on File Prior to Visit  Medication Sig Dispense Refill   ALPRAZolam (XANAX) 0.25 MG tablet Take 1 tablet (0.25 mg total) by mouth 2 (two) times daily as needed. 35 tablet 2   amLODipine (NORVASC) 5 MG tablet TAKE 1 TABLET(5 MG) BY MOUTH DAILY 90 tablet 0   Arginine 1000 MG TABS Take 3,000 mg by mouth in the morning and at bedtime.     Ascorbic Acid (VITAMIN C) 1000 MG tablet Take 1,000 mg by mouth daily.     aspirin EC 81 MG tablet Take 81 mg by mouth daily.     Cholecalciferol (VITAMIN D) 50 MCG (2000 UT) tablet Take 2,000 Units by mouth daily.     clobetasol ointment (TEMOVATE) 6.22 % Apply 1 application topically 2 (two) times daily. 30 g 2   co-enzyme Q-10 30 MG capsule Take 100 mg by mouth daily.     conjugated estrogens (PREMARIN) vaginal cream Place 1 Applicatorful vaginally 2 (two) times a week.      famotidine (PEPCID) 40 MG tablet TAKE 1 TABLET(40 MG) BY MOUTH AT BEDTIME 90 tablet 0   fish oil-omega-3 fatty acids 1000 MG capsule Take 3 g by mouth daily.     meclizine (ANTIVERT) 25 MG tablet Take 25 mg by mouth 3 (three) times daily as needed for dizziness.     nystatin-triamcinolone ointment (MYCOLOG) Apply 1  application topically as needed (yeast infection).     pravastatin (PRAVACHOL) 40 MG tablet TAKE 1 TABLET(40 MG) BY MOUTH EVERY EVENING 90 tablet 2   Suvorexant (BELSOMRA) 15 MG TABS Take 1 tablet by mouth at bedtime as needed. 30 tablet 5   telmisartan (MICARDIS) 80 MG tablet Take by mouth.     No current facility-administered medications on file prior to visit.    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 h/o such - cor CT 03/2020 with minimal CAD, consider noncardiac causes of pain.   Palpitations    Pre-diabetes    Sleep apnea    Thyroid nodule    The patient had a thyroid nodule that was surgically removed in the past.  She has one half of her thyroid and does not take replacement of Taxol and he was benign    Past Surgical History:  Procedure Laterality Date   APPENDECTOMY     BREAST BIOPSY     CESAREAN SECTION     x2   GANGLION CYST EXCISION     THYROIDECTOMY, PARTIAL     left   TUBAL LIGATION     VESICOVAGINAL FISTULA CLOSURE W/ TAH      Social History   Socioeconomic History   Marital status: Divorced    Spouse name: Not on file   Number of children: Not on file   Years of education: Not on file   Highest education level: Not on file  Occupational History   Occupation: Pharmacist, hospital  Tobacco Use   Smoking status: Never   Smokeless tobacco: Never  Substance and Sexual Activity   Alcohol use: No   Drug use: No   Sexual activity: Not Currently  Other Topics Concern   Not on file  Social History Narrative   Not on file   Social Determinants of Health   Financial Resource Strain: Low Risk    Difficulty of Paying Living Expenses: Not hard at all  Food Insecurity: No Food Insecurity   Worried About Charity fundraiser in the Last Year: Never true   Ran Out of Food in the Last Year: Never true  Transportation Needs: No Transportation Needs   Lack of Transportation (Medical): No   Lack of Transportation (Non-Medical): No  Physical Activity: Sufficiently Active   Days of Exercise per Week: 5 days    Minutes of Exercise per Session: 30 min  Stress: No Stress Concern Present   Feeling of Stress : Not at all  Social Connections: Moderately Integrated   Frequency of Communication with Friends and Family: More than three times a week   Frequency of Social Gatherings with Friends and Family: More than three times a week   Attends Religious Services: More than 4 times per year   Active Member of [employer] or Organizations: Yes   Attends Archivist Meetings:  More than 4 times per year   Marital Status: Never married    Family History  Problem Relation Age of Onset   Cancer Mother    Hypertension Father    Aneurysm Father 61       cerebral hemorrhage   Diabetes Other    Heart disease Other    Cancer Other    Arthritis Other    Hypertension Other    Aneurysm Other    Heart attack Maternal Grandfather    Stroke Paternal Grandfather    Stroke Maternal Grandmother    Cancer Brother     Review of Systems  Constitutional:  Negative for chills and fever.  Skin:  Positive for color change and rash.      Objective:   Vitals:   10/27/21 1055  BP: (!) 146/80  Pulse: 80  Temp: 98 F (36.7 C)  SpO2: 97%   BP Readings from Last 3 Encounters:  10/27/21 (!) 146/80  09/02/21 (!) 162/86  07/08/21 122/80   Wt Readings from Last 3 Encounters:  10/27/21 187 lb (84.8 kg)  09/02/21 184 lb (83.5 kg)  07/08/21 178 lb 12.8 oz (81.1 kg)   Body mass index is 32.1 kg/m.   Physical Exam Constitutional:      General: She is not in acute distress.    Appearance: Normal appearance. She is not ill-appearing.  HENT:     Head: Normocephalic and atraumatic.  Skin:    General: Skin is warm and dry.     Findings: Rash (Erythematous rash under pannus that extends bilaterally to lower abdomen/groin.  No ulcerations/discharge.  Satellite lesions present.  No blisters.) present.  Neurological:     Mental Status: She is alert.           Assessment & Plan:     Skin yeast  infection: Acute Located in the pannus extending to the bilateral groin area Rashes consistent with yeast infection She has tried 2 different creams and they have not helped-nystatin-triamcinolone and clotrimazole, which was expired and may have caused it to be not effective Start fluconazole 100 mg daily x10 days Econazole nitrate 1% cream twice daily Will cover for possible bacterial infection since that is consistent with her history-doxycycline 100 mg twice daily x5 days  She will call or return if there is no improvement

## 2021-10-27 ENCOUNTER — Ambulatory Visit: Payer: Medicare PPO | Admitting: Internal Medicine

## 2021-10-27 ENCOUNTER — Encounter: Payer: Self-pay | Admitting: Internal Medicine

## 2021-10-27 ENCOUNTER — Other Ambulatory Visit: Payer: Self-pay

## 2021-10-27 VITALS — BP 146/80 | HR 80 | Temp 98.0°F | Ht 64.0 in | Wt 187.0 lb

## 2021-10-27 DIAGNOSIS — B372 Candidiasis of skin and nail: Secondary | ICD-10-CM

## 2021-10-27 DIAGNOSIS — S86011D Strain of right Achilles tendon, subsequent encounter: Secondary | ICD-10-CM | POA: Diagnosis not present

## 2021-10-27 MED ORDER — ECONAZOLE NITRATE 1 % EX CREA: TOPICAL_CREAM | Freq: Two times a day (BID) | CUTANEOUS | 2 refills | Status: DC

## 2021-10-27 MED ORDER — DOXYCYCLINE HYCLATE 100 MG PO TABS: 100.0000 mg | ORAL_TABLET | Freq: Two times a day (BID) | ORAL | 0 refills | Status: AC

## 2021-10-27 MED ORDER — FLUCONAZOLE 100 MG PO TABS: 100.0000 mg | ORAL_TABLET | Freq: Every day | ORAL | 0 refills | Status: DC

## 2021-10-27 NOTE — Patient Instructions (Addendum)
° ° ° ° ° °  Medications changes include :     Econazole nitrate 1 % twice daily - use for a couple of days after rash resolves Fluconazole 100 mg daily for 10 days Doxycycline 100 mg twice daily for 5 days   Your prescription(s) have been sent to your pharmacy.    Return if symptoms worsen or fail to improve.

## 2021-10-29 DIAGNOSIS — S86011D Strain of right Achilles tendon, subsequent encounter: Secondary | ICD-10-CM | POA: Diagnosis not present

## 2021-11-03 ENCOUNTER — Encounter: Payer: Self-pay | Admitting: Internal Medicine

## 2021-11-03 MED ORDER — CLOTRIMAZOLE-BETAMETHASONE 1-0.05 % EX CREA: 1.0000 "application " | TOPICAL_CREAM | Freq: Two times a day (BID) | CUTANEOUS | 0 refills | Status: DC

## 2021-11-05 DIAGNOSIS — S86011D Strain of right Achilles tendon, subsequent encounter: Secondary | ICD-10-CM | POA: Diagnosis not present

## 2021-11-07 DIAGNOSIS — S86011D Strain of right Achilles tendon, subsequent encounter: Secondary | ICD-10-CM | POA: Diagnosis not present

## 2021-11-10 DIAGNOSIS — S86011D Strain of right Achilles tendon, subsequent encounter: Secondary | ICD-10-CM | POA: Diagnosis not present

## 2021-11-12 DIAGNOSIS — S86011D Strain of right Achilles tendon, subsequent encounter: Secondary | ICD-10-CM | POA: Diagnosis not present

## 2021-11-17 DIAGNOSIS — S86011D Strain of right Achilles tendon, subsequent encounter: Secondary | ICD-10-CM | POA: Diagnosis not present

## 2021-11-21 DIAGNOSIS — S86011D Strain of right Achilles tendon, subsequent encounter: Secondary | ICD-10-CM | POA: Diagnosis not present

## 2021-11-24 ENCOUNTER — Other Ambulatory Visit: Payer: Self-pay | Admitting: Internal Medicine

## 2021-12-03 ENCOUNTER — Encounter: Payer: Self-pay | Admitting: Internal Medicine

## 2021-12-03 ENCOUNTER — Ambulatory Visit: Payer: Medicare PPO | Admitting: Internal Medicine

## 2021-12-03 VITALS — BP 138/78 | HR 84 | Temp 98.1°F | Ht 64.0 in | Wt 188.0 lb

## 2021-12-03 DIAGNOSIS — Z0000 Encounter for general adult medical examination without abnormal findings: Secondary | ICD-10-CM

## 2021-12-03 DIAGNOSIS — D508 Other iron deficiency anemias: Secondary | ICD-10-CM | POA: Diagnosis not present

## 2021-12-03 DIAGNOSIS — R7303 Prediabetes: Secondary | ICD-10-CM

## 2021-12-03 DIAGNOSIS — F411 Generalized anxiety disorder: Secondary | ICD-10-CM | POA: Diagnosis not present

## 2021-12-03 DIAGNOSIS — F418 Other specified anxiety disorders: Secondary | ICD-10-CM | POA: Diagnosis not present

## 2021-12-03 DIAGNOSIS — B356 Tinea cruris: Secondary | ICD-10-CM | POA: Diagnosis not present

## 2021-12-03 DIAGNOSIS — M48061 Spinal stenosis, lumbar region without neurogenic claudication: Secondary | ICD-10-CM

## 2021-12-03 DIAGNOSIS — G4733 Obstructive sleep apnea (adult) (pediatric): Secondary | ICD-10-CM | POA: Diagnosis not present

## 2021-12-03 DIAGNOSIS — I10 Essential (primary) hypertension: Secondary | ICD-10-CM

## 2021-12-03 DIAGNOSIS — M5416 Radiculopathy, lumbar region: Secondary | ICD-10-CM

## 2021-12-03 LAB — CBC WITH DIFFERENTIAL/PLATELET
Basophils Absolute: 0 10*3/uL (ref 0.0–0.1)
Basophils Relative: 0.7 % (ref 0.0–3.0)
Eosinophils Absolute: 0.1 10*3/uL (ref 0.0–0.7)
Eosinophils Relative: 1.7 % (ref 0.0–5.0)
HCT: 39.6 % (ref 36.0–46.0)
Hemoglobin: 13.2 g/dL (ref 12.0–15.0)
Lymphocytes Relative: 31.6 % (ref 12.0–46.0)
Lymphs Abs: 1.9 10*3/uL (ref 0.7–4.0)
MCHC: 33.4 g/dL (ref 30.0–36.0)
MCV: 94.3 fl (ref 78.0–100.0)
Monocytes Absolute: 0.5 10*3/uL (ref 0.1–1.0)
Monocytes Relative: 8.3 % (ref 3.0–12.0)
Neutro Abs: 3.5 10*3/uL (ref 1.4–7.7)
Neutrophils Relative %: 57.7 % (ref 43.0–77.0)
Platelets: 240 10*3/uL (ref 150.0–400.0)
RBC: 4.21 Mil/uL (ref 3.87–5.11)
RDW: 13.5 % (ref 11.5–15.5)
WBC: 6.1 10*3/uL (ref 4.0–10.5)

## 2021-12-03 LAB — BASIC METABOLIC PANEL
BUN: 34 mg/dL — ABNORMAL HIGH (ref 6–23)
CO2: 27 mEq/L (ref 19–32)
Calcium: 10.1 mg/dL (ref 8.4–10.5)
Chloride: 105 mEq/L (ref 96–112)
Creatinine, Ser: 0.87 mg/dL (ref 0.40–1.20)
GFR: 66.21 mL/min (ref >60.00)
Glucose, Bld: 87 mg/dL (ref 70–99)
Potassium: 4.3 mEq/L (ref 3.5–5.1)
Sodium: 140 mEq/L (ref 135–145)

## 2021-12-03 LAB — HEMOGLOBIN A1C: Hgb A1c MFr Bld: 6.1 % (ref 4.6–6.5)

## 2021-12-03 MED ORDER — KETOCONAZOLE 200 MG PO TABS: 200.0000 mg | ORAL_TABLET | Freq: Every day | ORAL | 0 refills | Status: DC

## 2021-12-03 MED ORDER — ALPRAZOLAM 0.25 MG PO TABS: 0.2500 mg | ORAL_TABLET | Freq: Two times a day (BID) | ORAL | 2 refills | Status: DC | PRN

## 2021-12-03 NOTE — Patient Instructions (Signed)

## 2021-12-03 NOTE — Progress Notes (Signed)
? ?Subjective:  ?Patient ID: Kayla Khan, female    DOB: 1951/04/03  Age: 71 y.o. MRN: [id number] ? ?CC: Annual Exam, Hypertension, and Rash ? ?This visit occurred during the SARS-CoV-2 public health emergency.  Safety protocols were in place, including screening questions prior to the visit, additional usage of staff PPE, and extensive cleaning of exam room while observing appropriate contact time as indicated for disinfecting solutions.   ? ?HPI ?Kayla Khan presents for a CPX and f/up -  ? ?She has a history of lumbar stenosis and though she has no back pain she has worsening numbness and tingling in her feet and wants to see if she needs to have a procedure done for stenosis.  She also has numbness and tingling in her hands but it is not as severe as her feet.  She saw a neurologist just years ago and her nerve conduction study was normal.  She continues to complain of a rash over her lower abdomen.  She has tried topical [employer] , topical antifungals, topical steroids, and oral fluconazole with no improvement.  The area is red and itchy. ?Her blood pressure has been well controlled and she denies chest pain, shortness of breath, diaphoresis, dyspnea on exertion, or edema. ? ?Outpatient Medications Prior to Visit  ?Medication Sig Dispense Refill  ? amLODipine (NORVASC) 5 MG tablet TAKE 1 TABLET(5 MG) BY MOUTH DAILY 90 tablet 0  ? Arginine 1000 MG TABS Take 3,000 mg by mouth in the morning and at bedtime.    ? Ascorbic Acid (VITAMIN C) 1000 MG tablet Take 1,000 mg by mouth daily.    ? aspirin EC 81 MG tablet Take 81 mg by mouth daily.    ? Cholecalciferol (VITAMIN D) 50 MCG (2000 UT) tablet Take 2,000 Units by mouth daily.    ? clobetasol ointment (TEMOVATE) 1.88 % Apply 1 application topically 2 (two) times daily. 30 g 2  ? clotrimazole-betamethasone (LOTRISONE) cream APPLY TOPICALLY TO THE AFFECTED AREA TWICE DAILY FOR 2 WEEKS 30 g 0  ? co-enzyme Q-10 30 MG capsule Take 100 mg by mouth daily.    ?  conjugated estrogens (PREMARIN) vaginal cream Place 1 Applicatorful vaginally 2 (two) times a week.     ? famotidine (PEPCID) 40 MG tablet TAKE 1 TABLET(40 MG) BY MOUTH AT BEDTIME 90 tablet 0  ? fish oil-omega-3 fatty acids 1000 MG capsule Take 3 g by mouth daily.    ? meclizine (ANTIVERT) 25 MG tablet Take 25 mg by mouth 3 (three) times daily as needed for dizziness.    ? pravastatin (PRAVACHOL) 40 MG tablet TAKE 1 TABLET(40 MG) BY MOUTH EVERY EVENING 90 tablet 2  ? Suvorexant (BELSOMRA) 15 MG TABS Take 1 tablet by mouth at bedtime as needed. 30 tablet 5  ? telmisartan (MICARDIS) 80 MG tablet Take by mouth.    ? ALPRAZolam (XANAX) 0.25 MG tablet Take 1 tablet (0.25 mg total) by mouth 2 (two) times daily as needed. 35 tablet 2  ? fluconazole (DIFLUCAN) 100 MG tablet Take 1 tablet (100 mg total) by mouth daily. 10 tablet 0  ? nystatin-triamcinolone ointment (MYCOLOG) Apply 1 application topically as needed (yeast infection).    ? ?No facility-administered medications prior to visit.  ? ? ?ROS ?Review of Systems  ?Constitutional: Negative.  Negative for appetite change, diaphoresis, fatigue and unexpected weight change.  ?HENT: Negative.    ?Eyes: Negative.   ?Respiratory:  Positive for apnea. Negative for cough, chest tightness, shortness of  breath and wheezing.   ?Cardiovascular:  Negative for chest pain, palpitations and leg swelling.  ?Gastrointestinal:  Negative for abdominal pain, constipation, diarrhea, nausea and vomiting.  ?Endocrine: Negative.   ?Genitourinary: Negative.  Negative for difficulty urinating.  ?Musculoskeletal: Negative.  Negative for arthralgias, back pain and myalgias.  ?Skin:  Positive for rash. Negative for color change.  ?Hematological:  Negative for adenopathy. Does not bruise/bleed easily.  ?Psychiatric/Behavioral:  Positive for sleep disturbance. Negative for decreased concentration, dysphoric mood, hallucinations and self-injury. The patient is nervous/anxious.   ? ?Objective:  ?BP  138/78 (BP Location: Right Arm, Patient Position: Sitting, Cuff Size: Large)   Pulse 84   Temp 98.1 ?F (36.7 ?C) (Oral)   Ht '5\' 4"'$  (1.626 m)   Wt 188 lb (85.3 kg)   SpO2 98%   BMI 32.27 kg/m?  ? ?BP Readings from Last 3 Encounters:  ?12/03/21 138/78  ?10/27/21 (!) 146/80  ?09/02/21 (!) 162/86  ? ? ?Wt Readings from Last 3 Encounters:  ?12/03/21 188 lb (85.3 kg)  ?10/27/21 187 lb (84.8 kg)  ?09/02/21 184 lb (83.5 kg)  ? ? ?Physical Exam ?Vitals reviewed.  ?Constitutional:   ?   Appearance: She is not ill-appearing.  ?HENT:  ?   Mouth/Throat:  ?   Mouth: Mucous membranes are moist.  ?Eyes:  ?   General: No scleral icterus. ?   Conjunctiva/sclera: Conjunctivae normal.  ?Cardiovascular:  ?   Rate and Rhythm: Normal rate and regular rhythm.  ?   Heart sounds: No murmur heard. ?Pulmonary:  ?   Breath sounds: No stridor. No wheezing, rhonchi or rales.  ?Abdominal:  ?   General: Abdomen is flat.  ?   Palpations: There is no mass.  ?   Tenderness: There is no abdominal tenderness. There is no guarding or rebound.  ?   Hernia: No hernia is present.  ? ? ?Musculoskeletal:     ?   General: Normal range of motion.  ?   Cervical back: Neck supple.  ?   Right lower leg: No edema.  ?   Left lower leg: No edema.  ?Lymphadenopathy:  ?   Cervical: No cervical adenopathy.  ?Skin: ?   General: Skin is warm and dry.  ?   Findings: Erythema and rash present. No ecchymosis. Rash is papular. Rash is not crusting, pustular, scaling, urticarial or vesicular.  ?Neurological:  ?   General: No focal deficit present.  ?   Mental Status: She is alert. Mental status is at baseline.  ?Psychiatric:     ?   Mood and Affect: Mood normal.     ?   Behavior: Behavior normal.     ?   Thought Content: Thought content normal.     ?   Judgment: Judgment normal.  ? ? ?Lab Results  ?Component Value Date  ? WBC 6.1 12/03/2021  ? HGB 13.2 12/03/2021  ? HCT 39.6 12/03/2021  ? PLT 240.0 12/03/2021  ? GLUCOSE 87 12/03/2021  ? CHOL 133 07/21/2021  ? TRIG 59  07/21/2021  ? HDL 49 07/21/2021  ? LDLDIRECT 92.0 12/15/2016  ? [place] 71 07/21/2021  ? ALT 16 07/21/2021  ? AST 17 07/21/2021  ? NA 140 12/03/2021  ? K 4.3 12/03/2021  ? CL 105 12/03/2021  ? CREATININE 0.87 12/03/2021  ? BUN 34 (H) 12/03/2021  ? CO2 27 12/03/2021  ? TSH 1.840 05/22/2021  ? HGBA1C 6.1 12/03/2021  ? ? ?MR Brain Wo Contrast ? ?Result Date: 09/11/2021 ?  CLINICAL DATA:  Provided history: Transient amnesia. Dizziness, nonspecific. EXAM: MRI HEAD WITHOUT CONTRAST TECHNIQUE: Multiplanar, multiecho pulse sequences of the brain and surrounding structures were obtained without intravenous contrast. COMPARISON:  Head CT 06/30/2021. FINDINGS: Brain: Cerebral volume appears normal for age. Mild multifocal T2 FLAIR hyperintense signal abnormality within the cerebral white matter, nonspecific but compatible with chronic small vessel ischemic disease. There is no acute infarct. No evidence of an intracranial mass. No chronic intracranial blood products. No extra-axial fluid collection. No midline shift. Vascular: Maintained flow voids within the proximal large arterial vessels. Skull and upper cervical spine: No focal suspicious marrow lesion. Sinuses/Orbits: Visualized orbits show no acute finding. Bilateral lens replacements. Trace mucosal thickening within the bilateral ethmoid sinuses. IMPRESSION: No evidence of acute intracranial abnormality. Mild chronic small vessel ischemic changes within the cerebral white matter. Minimal mucosal thickening within the bilateral ethmoid sinuses. Electronically Signed   By: Kellie Simmering D.O.   On: 09/11/2021 07:48  ? ? ?Assessment & Plan:  ? ?Danel was seen today for annual exam, hypertension and rash. ? ?Diagnoses and all orders for this visit: ? ?GAD (generalized anxiety disorder) ?-     ALPRAZolam (XANAX) 0.25 MG tablet; Take 1 tablet (0.25 mg total) by mouth 2 (two) times daily as needed. ? ?Depression with anxiety ? ?Tinea cruris- Will try a more potent systemic antifungal  agent. ?-     Discontinue: ketoconazole (NIZORAL) 200 MG tablet; Take 1 tablet (200 mg total) by mouth daily for 28 days. ?-     terbinafine (LAMISIL) 250 MG tablet; Take 1 tablet (250 mg total) by mouth daily for

## 2021-12-04 ENCOUNTER — Ambulatory Visit: Payer: Medicare PPO | Admitting: Physician Assistant

## 2021-12-05 ENCOUNTER — Telehealth: Payer: Self-pay

## 2021-12-05 ENCOUNTER — Telehealth: Payer: Self-pay | Admitting: Internal Medicine

## 2021-12-05 ENCOUNTER — Encounter: Payer: Self-pay | Admitting: Internal Medicine

## 2021-12-05 MED ORDER — TERBINAFINE HCL 250 MG PO TABS: 250.0000 mg | ORAL_TABLET | Freq: Every day | ORAL | 0 refills | Status: DC

## 2021-12-05 NOTE — Telephone Encounter (Signed)
Per CoverMyMeds: ? ?PA was denied.  ?

## 2021-12-05 NOTE — Telephone Encounter (Signed)
Key: [id number] ?

## 2021-12-05 NOTE — Telephone Encounter (Signed)
N/A unable to leave a message for patient to call back to schedule Medicare Annual Wellness Visit  ? ?Last AWV  11/22/20 ? ?Please schedule at anytime with [facility]-Nurse Health Advisor if patient calls the office back.   ? ? ? ?Any questions, please call me at [phone]  ?

## 2021-12-17 ENCOUNTER — Other Ambulatory Visit: Payer: Self-pay | Admitting: Internal Medicine

## 2021-12-17 DIAGNOSIS — B356 Tinea cruris: Secondary | ICD-10-CM

## 2021-12-17 MED ORDER — TERBINAFINE HCL 250 MG PO TABS: 250.0000 mg | ORAL_TABLET | Freq: Every day | ORAL | 0 refills | Status: AC

## 2021-12-22 DIAGNOSIS — M48061 Spinal stenosis, lumbar region without neurogenic claudication: Secondary | ICD-10-CM | POA: Diagnosis not present

## 2021-12-22 DIAGNOSIS — Z6831 Body mass index (BMI) 31.0-31.9, adult: Secondary | ICD-10-CM | POA: Diagnosis not present

## 2021-12-25 ENCOUNTER — Encounter: Payer: Self-pay | Admitting: Physician Assistant

## 2021-12-25 ENCOUNTER — Ambulatory Visit: Payer: Medicare PPO | Admitting: Physician Assistant

## 2021-12-25 DIAGNOSIS — M48061 Spinal stenosis, lumbar region without neurogenic claudication: Secondary | ICD-10-CM | POA: Diagnosis not present

## 2021-12-25 DIAGNOSIS — L304 Erythema intertrigo: Secondary | ICD-10-CM

## 2021-12-25 DIAGNOSIS — L820 Inflamed seborrheic keratosis: Secondary | ICD-10-CM | POA: Diagnosis not present

## 2021-12-25 DIAGNOSIS — L089 Local infection of the skin and subcutaneous tissue, unspecified: Secondary | ICD-10-CM

## 2021-12-25 MED ORDER — ALCLOMETASONE DIPROPIONATE 0.05 % EX CREA: TOPICAL_CREAM | Freq: Two times a day (BID) | CUTANEOUS | 3 refills | Status: DC | PRN

## 2021-12-25 MED ORDER — KETOCONAZOLE 2 % EX CREA: 1.0000 "application " | TOPICAL_CREAM | Freq: Every day | CUTANEOUS | 2 refills | Status: AC

## 2021-12-25 NOTE — Progress Notes (Addendum)
? ?  Follow-Up Visit ?  ?Subjective  ?Kayla Khan is a 71 y.o. female who presents for the following: Annual Exam (Lesion on back of scalp x years- been removed in past but grows back. No personal or family history of melanoma or non mole skin cancers. Wants to talk about yeast under abdomen- pcp gave diflucan x 10 days, zpak, terbinafine 250 x 3 weeks, topical Lotrisone cream.). ? ? ?The following portions of the chart were reviewed this encounter and updated as appropriate:  Tobacco  Allergies  Meds  Problems  Med Hx  Surg Hx  Fam Hx   ?  ? ?Objective  ?Well appearing patient in no apparent distress; mood and affect are within normal limits. ? ?A full examination was performed including scalp, head, eyes, ears, nose, lips, neck, chest, axillae, abdomen, back, buttocks, bilateral upper extremities, bilateral lower extremities, hands, feet, fingers, toes, fingernails, and toenails. All findings within normal limits unless otherwise noted below. ? ?Left Abdomen (side) - Upper, Right Abdomen (side) - Upper ?Erythema and scale.  ? ?Scalp ?Stuck on crusted plaque ? ? ?Assessment & Plan  ?Intertrigo ?Left Abdomen (side) - Upper; Right Abdomen (side) - Upper ? ?ketoconazole (NIZORAL) 2 % cream - Left Abdomen (side) - Upper, Right Abdomen (side) - Upper ?Apply 1 application. topically daily. ? ?alclomethasone (ACLOVATE) 0.05 % cream - Left Abdomen (side) - Upper, Right Abdomen (side) - Upper ?Apply topically 2 (two) times daily as needed (Rash). ? ?Anaerobic and Aerobic Culture - Left Abdomen (side) - Upper, Right Abdomen (side) - Upper ? ?Infection of skin and subcutaneous tissue ? ?Related Procedures ?Anaerobic and Aerobic Culture ? ?Inflamed seborrheic keratosis ?Scalp ? ?Destruction of lesion - Scalp ?Complexity: simple   ?Destruction method: cryotherapy   ?Informed consent: discussed and consent obtained   ?Timeout:  patient name, date of birth, surgical site, and procedure verified ?Lesion destroyed using  liquid nitrogen: Yes   ?Cryotherapy cycles:  1 ?Outcome: patient tolerated procedure well with no complications   ?Post-procedure details: wound care instructions given   ? ? ? ?I, Sherin Murdoch, PA-C, have reviewed all documentation's for this visit.  The documentation on 12/31/21 for the exam, diagnosis, procedures and orders are all accurate and complete. ?

## 2021-12-25 NOTE — Patient Instructions (Addendum)
Use creams at night and powder in the morning.  ? ?Psychiatric nurse with [facility]. - Dr. Claudia Desanctis ?

## 2021-12-29 MED ORDER — CLOBETASOL PROPIONATE 0.05 % EX SOLN: 1.0000 "application " | Freq: Two times a day (BID) | CUTANEOUS | 0 refills | Status: DC

## 2021-12-31 ENCOUNTER — Telehealth: Payer: Self-pay

## 2021-12-31 DIAGNOSIS — Z6831 Body mass index (BMI) 31.0-31.9, adult: Secondary | ICD-10-CM | POA: Diagnosis not present

## 2021-12-31 DIAGNOSIS — M48061 Spinal stenosis, lumbar region without neurogenic claudication: Secondary | ICD-10-CM | POA: Diagnosis not present

## 2021-12-31 NOTE — Telephone Encounter (Signed)
Phone call to patient with her bacteria culture results. Voicemail left for patient to give the office a call back.  ?

## 2021-12-31 NOTE — Telephone Encounter (Signed)
-----   Message from Warren Danes, [place] sent at 12/31/2021  8:12 AM EDT ----- ?Check status. If not improved or completely cleared. Will need to start her on an antibiotic. Bactrim if not allergic.  ?

## 2022-01-01 DIAGNOSIS — L900 Lichen sclerosus et atrophicus: Secondary | ICD-10-CM | POA: Diagnosis not present

## 2022-01-01 DIAGNOSIS — M8580 Other specified disorders of bone density and structure, unspecified site: Secondary | ICD-10-CM | POA: Diagnosis not present

## 2022-01-01 DIAGNOSIS — N898 Other specified noninflammatory disorders of vagina: Secondary | ICD-10-CM | POA: Diagnosis not present

## 2022-01-01 DIAGNOSIS — Z6832 Body mass index (BMI) 32.0-32.9, adult: Secondary | ICD-10-CM | POA: Diagnosis not present

## 2022-01-01 DIAGNOSIS — R29890 Loss of height: Secondary | ICD-10-CM | POA: Diagnosis not present

## 2022-01-01 DIAGNOSIS — Z1151 Encounter for screening for human papillomavirus (HPV): Secondary | ICD-10-CM | POA: Diagnosis not present

## 2022-01-01 DIAGNOSIS — Z124 Encounter for screening for malignant neoplasm of cervix: Secondary | ICD-10-CM | POA: Diagnosis not present

## 2022-01-01 DIAGNOSIS — N958 Other specified menopausal and perimenopausal disorders: Secondary | ICD-10-CM | POA: Diagnosis not present

## 2022-01-01 DIAGNOSIS — M8588 Other specified disorders of bone density and structure, other site: Secondary | ICD-10-CM | POA: Diagnosis not present

## 2022-01-01 LAB — ANAEROBIC AND AEROBIC CULTURE
MICRO NUMBER:: 13290197
MICRO NUMBER:: 13290198
SPECIMEN QUALITY:: ADEQUATE
SPECIMEN QUALITY:: ADEQUATE

## 2022-01-01 NOTE — Telephone Encounter (Signed)
Patient left message on office voice mail that she was returning telephone call about results. ?

## 2022-01-01 NOTE — Telephone Encounter (Signed)
Phone call to patient with her culture results. Patient states she's doing good she's healing slowly. [place] aware.  ?

## 2022-01-01 NOTE — Telephone Encounter (Signed)
Patient called back. She's out this morning but will be back in afternoon for return call for results ?

## 2022-01-02 ENCOUNTER — Encounter: Payer: Self-pay | Admitting: Adult Health

## 2022-01-02 ENCOUNTER — Ambulatory Visit: Payer: Medicare PPO | Admitting: Adult Health

## 2022-01-02 DIAGNOSIS — E669 Obesity, unspecified: Secondary | ICD-10-CM

## 2022-01-02 DIAGNOSIS — G4733 Obstructive sleep apnea (adult) (pediatric): Secondary | ICD-10-CM

## 2022-01-02 DIAGNOSIS — E66811 Obesity, class 1: Secondary | ICD-10-CM

## 2022-01-02 NOTE — Patient Instructions (Signed)
Continue on CPAP At bedtime.  ?Keep up good work .  ?Stay active , work on weight loss  ?Do not drive if sleepy  ?follow up Dr. Elsworth Soho  In 1 year and As needed   ? ?

## 2022-01-02 NOTE — Assessment & Plan Note (Signed)
Excellent control and compliance on CPAP.  Patient is to continue on current settings. ? ?Plan  ?Patient Instructions  ?Continue on CPAP At bedtime.  ?Keep up good work .  ?Stay active , work on weight loss  ?Do not drive if sleepy  ?follow up Dr. Elsworth Soho  In 1 year and As needed   ? ?  ? ?

## 2022-01-02 NOTE — Assessment & Plan Note (Signed)
Healthy weight loss discussed 

## 2022-01-02 NOTE — Progress Notes (Signed)
? ?'@Patient'  ID: Kayla Khan, female    DOB: [birth year], 71 y.o.   MRN: [id number] ? ?Chief Complaint  ?Patient presents with  ? Follow-up  ? ? ?Referring provider: ?Janith Lima, MD ? ?HPI: ?71 year old female followed for sleep apnea on nocturnal CPAP ? ?TEST/EVENTS :  ?NPSG 2004:  AHI 10/hr, cpap optimized to 5cm. ? ?01/02/2022 Follow up : OSA  ?Patient returns for follow-up visit.  She was last seen July 2020.  She has underlying sleep apnea.  Patient says she wears her CPAP every single night.  Never misses any nights.  Feels that she benefits from CPAP with decreased daytime sleepiness.  CPAP download shows excellent compliance with 100% usage.  Daily average usage at 7.5 hours.  Patient is on auto CPAP 5 to 15 cm H2O.  AHI is 1.9/hour.  Minimal leaks. ? ? ? ?Allergies  ?Allergen Reactions  ? Amoxicillin-Pot Clavulanate   ? Beta Adrenergic Blockers   ?  Makes chest pain worse  ? Crestor [Rosuvastatin]   ? Indapamide   ? Latex   ? Meperidine Hcl   ? Paxil [Paroxetine]   ? Hctz [Hydrochlorothiazide] Nausea And Vomiting  ? ? ?Immunization History  ?Administered Date(s) Administered  ? DTaP 10/18/2004  ? Influenza Split 07/08/2012, 04/16/2016  ? Influenza Whole 06/19/2010, 06/30/2011  ? Influenza, High Dose Seasonal PF 04/24/2017, 06/06/2018, 04/26/2019, 05/07/2020  ? Influenza,inj,Quad PF,6+ Mos 07/03/2013  ? Influenza-Unspecified 06/07/2014, 05/24/2017  ? PFIZER(Purple Top)SARS-COV-2 Vaccination 10/13/2019, 11/07/2019, 06/03/2020  ? Pneumococcal Conjugate-13 03/22/2013  ? Pneumococcal Polysaccharide-23 04/28/2007, 12/14/2016  ? Tdap 12/14/2016  ? Zoster Recombinat (Shingrix) 12/15/2016, 06/16/2017  ? Zoster, Live 06/19/2010  ? ? ?Past Medical History:  ?Diagnosis Date  ? Allergic conjunctivitis   ? Anemia, iron deficiency   ? Arthritis   ? Asthma   ? Chest tightness or pressure   ? 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 Depression with anxiety   ?  Diverticulosis of colon   ? DJD (degenerative joint disease)   ? Ejection fraction   ? 70%, catheter 2004 / normal LV function nuclear, 2008  ? Essential hypertension   ? Fatigue   ? GERD (gastroesophageal reflux disease)   ? HLA B27 positive   ? HLD (hyperlipidemia)   ? 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 a. h/o such - cor CT 03/2020 with minimal CAD, consider noncardiac causes of pain.  ? Palpitations   ? Pre-diabetes   ? Sleep apnea   ? Thyroid nodule   ? The patient had a thyroid nodule that was surgically removed in the past.  She has one half of her thyroid and does not take replacement of Taxol and he was benign  ? ? ?Tobacco History: ?Social History  ? ?Tobacco Use  ?Smoking Status Never  ?Smokeless Tobacco Never  ? ?Counseling given: Not Answered ? ? ?Outpatient Medications Prior to Visit  ?Medication Sig Dispense Refill  ? alclomethasone (ACLOVATE) 0.05 % cream Apply topically 2 (two) times daily as needed (Rash). 180 g 3  ? ALPRAZolam (XANAX) 0.25 MG tablet Take 1 tablet (0.25 mg total) by mouth 2 (two) times daily as needed. 35 tablet 2  ? amLODipine (NORVASC) 5 MG tablet TAKE 1 TABLET(5 MG) BY MOUTH DAILY 90 tablet 0  ? Arginine 1000 MG TABS Take 3,000 mg by mouth in the morning and at bedtime.    ? Ascorbic Acid (VITAMIN C) 1000 MG tablet Take 1,000 mg by mouth  daily.    ? aspirin EC 81 MG tablet Take 81 mg by mouth daily.    ? Cholecalciferol (VITAMIN D) 50 MCG (2000 UT) tablet Take 2,000 Units by mouth daily.    ? clobetasol ointment (TEMOVATE) 2.20 % Apply 1 application topically 2 (two) times daily. 30 g 2  ? clotrimazole-betamethasone (LOTRISONE) cream APPLY TOPICALLY TO THE AFFECTED AREA TWICE DAILY FOR 2 WEEKS 30 g 0  ? co-enzyme Q-10 30 MG capsule Take 100 mg by mouth daily.    ? conjugated estrogens (PREMARIN) vaginal cream Place 1 Applicatorful vaginally 2 (two) times a week.     ? famotidine (PEPCID) 40 MG tablet TAKE 1 TABLET(40 MG) BY MOUTH AT BEDTIME 90 tablet 0  ? fish oil-omega-3 fatty acids  1000 MG capsule Take 3 g by mouth daily.    ? ketoconazole (NIZORAL) 2 % cream Apply 1 application. topically daily. 60 g 2  ? meclizine (ANTIVERT) 25 MG tablet Take 25 mg by mouth 3 (three) times daily as needed for dizziness.    ? pravastatin (PRAVACHOL) 40 MG tablet TAKE 1 TABLET(40 MG) BY MOUTH EVERY EVENING 90 tablet 2  ? Suvorexant (BELSOMRA) 15 MG TABS Take 1 tablet by mouth at bedtime as needed. 30 tablet 5  ? telmisartan (MICARDIS) 80 MG tablet Take by mouth.    ? clobetasol (TEMOVATE) 0.05 % external solution Apply 1 application. topically 2 (two) times daily. 50 mL 0  ? ?No facility-administered medications prior to visit.  ? ? ? ?Review of Systems:  ? ?Constitutional:   No  weight loss, night sweats,  Fevers, chills, fatigue, or  lassitude. ? ?HEENT:   No headaches,  Difficulty swallowing,  Tooth/dental problems, or  Sore throat,  ?              No sneezing, itching, ear ache, nasal congestion, post nasal drip,  ? ?CV:  No chest pain,  Orthopnea, PND, swelling in lower extremities, anasarca, dizziness, palpitations, syncope.  ? ?GI  No heartburn, indigestion, abdominal pain, nausea, vomiting, diarrhea, change in bowel habits, loss of appetite, bloody stools.  ? ?Resp: No shortness of breath with exertion or at rest.  No excess mucus, no productive cough,  No non-productive cough,  No coughing up of blood.  No change in color of mucus.  No wheezing.  No chest wall deformity ? ?Skin: no rash or lesions. ? ?GU: no dysuria, change in color of urine, no urgency or frequency.  No flank pain, no hematuria  ? ?MS:  No joint pain or swelling.  No decreased range of motion.  No back pain. ? ? ? ?Physical Exam ? ?BP 124/70 (BP Location: Left Arm, Patient Position: Sitting)   Pulse 86   Temp 97.6 ?F (36.4 ?C) (Oral)   Ht '5\' 4"'  (1.626 m)   Wt 188 lb 12.8 oz (85.6 kg)   SpO2 98% Comment: RA  BMI 32.41 kg/m?  ? ?GEN: A/Ox3; pleasant , NAD, well nourished  ?  ?HEENT:  [place]/AT,  EACs-clear, TMs-wnl, NOSE-clear,  THROAT-clear, no lesions, no postnasal drip or exudate noted.  ? ?NECK:  Supple w/ fair ROM; no JVD; normal carotid impulses w/o bruits; no thyromegaly or nodules palpated; no lymphadenopathy.   ? ?RESP  Clear  P & A; w/o, wheezes/ rales/ or rhonchi. no accessory muscle use, no dullness to percussion ? ?CARD:  RRR, no m/r/g, no peripheral edema, pulses intact, no cyanosis or clubbing. ? ?GI:   Soft & nt; nml bowel  sounds; no organomegaly or masses detected.  ? ?Musco: Warm bil, no deformities or joint swelling noted.  ? ?Neuro: alert, no focal deficits noted.   ? ?Skin: Warm, no lesions or rashes ? ? ? ?Lab Results: ? ? ? ? ?BNP ?No results found for: BNP ? ?ProBNP ? ? ?Imaging: ?No results found. ? ? ? ? ?Assessment & Plan:  ? ?OSA (obstructive sleep apnea) ?Excellent control and compliance on CPAP.  Patient is to continue on current settings. ? ?Plan  ?Patient Instructions  ?Continue on CPAP At bedtime.  ?Keep up good work .  ?Stay active , work on weight loss  ?Do not drive if sleepy  ?follow up Dr. Elsworth Soho  In 1 year and As needed   ? ?  ? ? ?Obesity (BMI 30.0-34.9) ?Healthy weight loss discussed ? ? ? ? ?Rexene Edison, NP ?01/02/2022 ? ?

## 2022-01-05 ENCOUNTER — Encounter: Payer: Self-pay | Admitting: Physician Assistant

## 2022-01-06 ENCOUNTER — Telehealth: Payer: Self-pay | Admitting: *Deleted

## 2022-01-06 MED ORDER — CIPROFLOXACIN HCL 500 MG PO TABS: 500.0000 mg | ORAL_TABLET | Freq: Two times a day (BID) | ORAL | 0 refills | Status: DC

## 2022-01-06 NOTE — Addendum Note (Signed)
Addended by: Lennie Odor on: 01/06/2022 03:24 PM ? ? Modules accepted: Orders ? ?

## 2022-01-06 NOTE — Telephone Encounter (Signed)
Left message for patient to return our phone call for bacterial results (see Kelli sheffield note) ? ? Warren Danes, PA-C  ?01/06/2022  8:40 AM EDT Back to Top  ?  ? She has an infection. If not better- cipro 500 BID #14. Warn if muscle pains- Stop the medication immediately. Take with food  ? ?

## 2022-01-06 NOTE — Telephone Encounter (Signed)
Phone call from patient returning our call. Patient aware of culture results, patient states that she's still dealing with the redness and she would like the antibiotic sent to her pharmacy. Prescription sent in.  ?

## 2022-01-06 NOTE — Telephone Encounter (Signed)
Left message for patient to return our phone call.

## 2022-01-06 NOTE — Telephone Encounter (Signed)
Patient is returning call about results and possible medication.  Patient would like to be called on the home telephone number [phone]. ?

## 2022-01-13 ENCOUNTER — Encounter: Payer: Self-pay | Admitting: Physician Assistant

## 2022-01-19 ENCOUNTER — Other Ambulatory Visit: Payer: Self-pay | Admitting: Internal Medicine

## 2022-01-19 DIAGNOSIS — K2100 Gastro-esophageal reflux disease with esophagitis, without bleeding: Secondary | ICD-10-CM

## 2022-01-20 ENCOUNTER — Other Ambulatory Visit: Payer: Self-pay | Admitting: Internal Medicine

## 2022-01-20 DIAGNOSIS — I700 Atherosclerosis of aorta: Secondary | ICD-10-CM

## 2022-01-20 DIAGNOSIS — I10 Essential (primary) hypertension: Secondary | ICD-10-CM

## 2022-01-28 ENCOUNTER — Telehealth: Payer: Self-pay | Admitting: Internal Medicine

## 2022-01-28 NOTE — Telephone Encounter (Signed)
N/A unable to leave a message for patient to call back to schedule Medicare Annual Wellness Visit  ? ?Last AWV  11/22/20 ? ?Please schedule at anytime with [facility]-Nurse Health Advisor if patient calls the office back.   ? ? ? ?Any questions, please call me at [phone]  ?

## 2022-03-19 DIAGNOSIS — H04123 Dry eye syndrome of bilateral lacrimal glands: Secondary | ICD-10-CM | POA: Diagnosis not present

## 2022-03-19 DIAGNOSIS — H52201 Unspecified astigmatism, right eye: Secondary | ICD-10-CM | POA: Diagnosis not present

## 2022-03-19 DIAGNOSIS — H43813 Vitreous degeneration, bilateral: Secondary | ICD-10-CM | POA: Diagnosis not present

## 2022-03-19 DIAGNOSIS — Z1231 Encounter for screening mammogram for malignant neoplasm of breast: Secondary | ICD-10-CM | POA: Diagnosis not present

## 2022-03-19 DIAGNOSIS — H524 Presbyopia: Secondary | ICD-10-CM | POA: Diagnosis not present

## 2022-03-19 LAB — HM MAMMOGRAPHY

## 2022-03-30 DIAGNOSIS — E669 Obesity, unspecified: Secondary | ICD-10-CM | POA: Diagnosis not present

## 2022-03-30 DIAGNOSIS — G4700 Insomnia, unspecified: Secondary | ICD-10-CM | POA: Diagnosis not present

## 2022-03-30 DIAGNOSIS — N951 Menopausal and female climacteric states: Secondary | ICD-10-CM | POA: Diagnosis not present

## 2022-03-30 DIAGNOSIS — I10 Essential (primary) hypertension: Secondary | ICD-10-CM | POA: Diagnosis not present

## 2022-03-30 DIAGNOSIS — K219 Gastro-esophageal reflux disease without esophagitis: Secondary | ICD-10-CM | POA: Diagnosis not present

## 2022-03-30 DIAGNOSIS — E785 Hyperlipidemia, unspecified: Secondary | ICD-10-CM | POA: Diagnosis not present

## 2022-03-30 DIAGNOSIS — M1990 Unspecified osteoarthritis, unspecified site: Secondary | ICD-10-CM | POA: Diagnosis not present

## 2022-03-30 DIAGNOSIS — I2510 Atherosclerotic heart disease of native coronary artery without angina pectoris: Secondary | ICD-10-CM | POA: Diagnosis not present

## 2022-03-30 DIAGNOSIS — F411 Generalized anxiety disorder: Secondary | ICD-10-CM | POA: Diagnosis not present

## 2022-04-12 ENCOUNTER — Other Ambulatory Visit: Payer: Self-pay | Admitting: Physician Assistant

## 2022-04-14 ENCOUNTER — Other Ambulatory Visit: Payer: Self-pay | Admitting: Physician Assistant

## 2022-04-15 ENCOUNTER — Other Ambulatory Visit: Payer: Self-pay | Admitting: Internal Medicine

## 2022-04-15 DIAGNOSIS — I10 Essential (primary) hypertension: Secondary | ICD-10-CM

## 2022-04-15 DIAGNOSIS — K2100 Gastro-esophageal reflux disease with esophagitis, without bleeding: Secondary | ICD-10-CM

## 2022-04-15 DIAGNOSIS — I700 Atherosclerosis of aorta: Secondary | ICD-10-CM

## 2022-05-12 ENCOUNTER — Other Ambulatory Visit: Payer: Self-pay | Admitting: Physician Assistant

## 2022-06-04 ENCOUNTER — Ambulatory Visit: Payer: Medicare PPO | Admitting: Internal Medicine

## 2022-06-04 ENCOUNTER — Encounter: Payer: Self-pay | Admitting: Internal Medicine

## 2022-06-04 VITALS — BP 132/68 | HR 91 | Temp 98.1°F | Ht 64.0 in | Wt 187.0 lb

## 2022-06-04 DIAGNOSIS — Z23 Encounter for immunization: Secondary | ICD-10-CM

## 2022-06-04 DIAGNOSIS — R7303 Prediabetes: Secondary | ICD-10-CM

## 2022-06-04 DIAGNOSIS — E785 Hyperlipidemia, unspecified: Secondary | ICD-10-CM | POA: Diagnosis not present

## 2022-06-04 DIAGNOSIS — I10 Essential (primary) hypertension: Secondary | ICD-10-CM | POA: Diagnosis not present

## 2022-06-04 DIAGNOSIS — I700 Atherosclerosis of aorta: Secondary | ICD-10-CM | POA: Diagnosis not present

## 2022-06-04 DIAGNOSIS — F5104 Psychophysiologic insomnia: Secondary | ICD-10-CM

## 2022-06-04 DIAGNOSIS — K2100 Gastro-esophageal reflux disease with esophagitis, without bleeding: Secondary | ICD-10-CM

## 2022-06-04 LAB — BASIC METABOLIC PANEL
BUN: 26 mg/dL — ABNORMAL HIGH (ref 6–23)
CO2: 26 mEq/L (ref 19–32)
Calcium: 9.9 mg/dL (ref 8.4–10.5)
Chloride: 107 mEq/L (ref 96–112)
Creatinine, Ser: 0.89 mg/dL (ref 0.40–1.20)
GFR: 64.2 mL/min (ref >60.00)
Glucose, Bld: 88 mg/dL (ref 70–99)
Potassium: 4.5 mEq/L (ref 3.5–5.1)
Sodium: 140 mEq/L (ref 135–145)

## 2022-06-04 LAB — CBC WITH DIFFERENTIAL/PLATELET
Basophils Absolute: 0.1 10*3/uL (ref 0.0–0.1)
Basophils Relative: 0.9 % (ref 0.0–3.0)
Eosinophils Absolute: 0.2 10*3/uL (ref 0.0–0.7)
Eosinophils Relative: 2.8 % (ref 0.0–5.0)
HCT: 40 % (ref 36.0–46.0)
Hemoglobin: 13.6 g/dL (ref 12.0–15.0)
Lymphocytes Relative: 22.5 % (ref 12.0–46.0)
Lymphs Abs: 1.5 10*3/uL (ref 0.7–4.0)
MCHC: 33.9 g/dL (ref 30.0–36.0)
MCV: 93.2 fl (ref 78.0–100.0)
Monocytes Absolute: 0.7 10*3/uL (ref 0.1–1.0)
Monocytes Relative: 9.8 % (ref 3.0–12.0)
Neutro Abs: 4.3 10*3/uL (ref 1.4–7.7)
Neutrophils Relative %: 64 % (ref 43.0–77.0)
Platelets: 256 10*3/uL (ref 150.0–400.0)
RBC: 4.3 Mil/uL (ref 3.87–5.11)
RDW: 12.9 % (ref 11.5–15.5)
WBC: 6.7 10*3/uL (ref 4.0–10.5)

## 2022-06-04 LAB — LIPID PANEL
Cholesterol: 139 mg/dL (ref 0–200)
HDL: 44.5 mg/dL (ref >39.00)
LDL Cholesterol: 66 mg/dL (ref 0–99)
NonHDL: 94.54
Total CHOL/HDL Ratio: 3
Triglycerides: 145 mg/dL (ref 0.0–149.0)
VLDL: 29 mg/dL (ref 0.0–40.0)

## 2022-06-04 LAB — HEMOGLOBIN A1C: Hgb A1c MFr Bld: 5.9 % (ref 4.6–6.5)

## 2022-06-04 MED ORDER — BELSOMRA 15 MG PO TABS: 1.0000 | ORAL_TABLET | Freq: Every evening | ORAL | 5 refills | Status: DC | PRN

## 2022-06-04 MED ORDER — AZITHROMYCIN 500 MG PO TABS: 500.0000 mg | ORAL_TABLET | Freq: Every day | ORAL | 0 refills | Status: DC

## 2022-06-04 MED ORDER — PRAVASTATIN SODIUM 40 MG PO TABS: 40.0000 mg | ORAL_TABLET | Freq: Every day | ORAL | 1 refills | Status: DC

## 2022-06-04 MED ORDER — FAMOTIDINE 40 MG PO TABS: 40.0000 mg | ORAL_TABLET | Freq: Every day | ORAL | 1 refills | Status: DC

## 2022-06-04 MED ORDER — TELMISARTAN 80 MG PO TABS: 80.0000 mg | ORAL_TABLET | Freq: Every day | ORAL | 1 refills | Status: DC

## 2022-06-04 MED ORDER — AMLODIPINE BESYLATE 5 MG PO TABS: ORAL_TABLET | ORAL | 1 refills | Status: DC

## 2022-06-04 NOTE — Progress Notes (Signed)
Subjective:  Patient ID: Kayla Khan, female    DOB: [birth year]  Age: 71 y.o. MRN: [id number]  CC: Hypertension, Hyperlipidemia, and Gastroesophageal Reflux   HPI Kayla Khan presents for f/up -  She is active and denies DOE, CP, diaphoresis, edema, fatigue.  Outpatient Medications Prior to Visit  Medication Sig Dispense Refill   alclomethasone (ACLOVATE) 0.05 % cream Apply topically 2 (two) times daily as needed (Rash). 180 g 3   ALPRAZolam (XANAX) 0.25 MG tablet Take 1 tablet (0.25 mg total) by mouth 2 (two) times daily as needed. 35 tablet 2   Arginine 1000 MG TABS Take 3,000 mg by mouth in the morning and at bedtime.     Ascorbic Acid (VITAMIN C) 1000 MG tablet Take 1,000 mg by mouth daily.     aspirin EC 81 MG tablet Take 81 mg by mouth daily.     Cholecalciferol (VITAMIN D) 50 MCG (2000 UT) tablet Take 2,000 Units by mouth daily.     ciprofloxacin (CIPRO) 500 MG tablet Take 1 tablet (500 mg total) by mouth 2 (two) times daily. 14 tablet 0   clobetasol (TEMOVATE) 0.05 % external solution Apply 1 application. topically 2 (two) times daily. 50 mL 0   clobetasol ointment (TEMOVATE) 5.46 % Apply 1 application topically 2 (two) times daily. 30 g 2   clotrimazole-betamethasone (LOTRISONE) cream APPLY TOPICALLY TO THE AFFECTED AREA TWICE DAILY FOR 2 WEEKS 30 g 0   co-enzyme Q-10 30 MG capsule Take 100 mg by mouth daily.     conjugated estrogens (PREMARIN) vaginal cream Place 1 Applicatorful vaginally 2 (two) times a week.      fish oil-omega-3 fatty acids 1000 MG capsule Take 3 g by mouth daily.     meclizine (ANTIVERT) 25 MG tablet Take 25 mg by mouth 3 (three) times daily as needed for dizziness.     amLODipine (NORVASC) 5 MG tablet TAKE 1 TABLET(5 MG) BY MOUTH DAILY 90 tablet 0   famotidine (PEPCID) 40 MG tablet TAKE 1 TABLET(40 MG) BY MOUTH AT BEDTIME 90 tablet 0   pravastatin (PRAVACHOL) 40 MG tablet TAKE 1 TABLET(40 MG) BY MOUTH EVERY EVENING 15 tablet 0   Suvorexant  (BELSOMRA) 15 MG TABS Take 1 tablet by mouth at bedtime as needed. 30 tablet 5   telmisartan (MICARDIS) 80 MG tablet Take by mouth.     No facility-administered medications prior to visit.    ROS Review of Systems  Constitutional:  Negative for chills, diaphoresis, fatigue and fever.  HENT: Negative.    Eyes: Negative.   Respiratory:  Negative for cough, chest tightness, shortness of breath and wheezing.   Cardiovascular:  Negative for chest pain, palpitations and leg swelling.  Gastrointestinal:  Negative for abdominal pain, constipation, diarrhea and vomiting.  Endocrine: Negative.   Genitourinary:  Negative for difficulty urinating.  Musculoskeletal: Negative.  Negative for myalgias.  Skin: Negative.   Neurological:  Negative for dizziness, weakness and light-headedness.  Hematological:  Negative for adenopathy. Does not bruise/bleed easily.  Psychiatric/Behavioral:  Positive for sleep disturbance. Negative for decreased concentration and dysphoric mood. The patient is not nervous/anxious.     Objective:  BP 132/68 (BP Location: Left Arm, Patient Position: Sitting, Cuff Size: Large)   Pulse 91   Temp 98.1 F (36.7 C) (Oral)   Ht '5\' 4"'$  (1.626 m)   Wt 187 lb (84.8 kg)   SpO2 97%   BMI 32.10 kg/m   BP Readings from Last 3 Encounters:  06/04/22  132/68  01/02/22 124/70  12/03/21 138/78    Wt Readings from Last 3 Encounters:  06/04/22 187 lb (84.8 kg)  01/02/22 188 lb 12.8 oz (85.6 kg)  12/03/21 188 lb (85.3 kg)    Physical Exam Vitals reviewed.  HENT:     Nose: Nose normal.     Mouth/Throat:     Mouth: Mucous membranes are moist.  Eyes:     General: No scleral icterus.    Conjunctiva/sclera: Conjunctivae normal.  Cardiovascular:     Rate and Rhythm: Normal rate and regular rhythm.     Heart sounds: No murmur heard. Pulmonary:     Effort: Pulmonary effort is normal.     Breath sounds: No stridor. No wheezing, rhonchi or rales.  Abdominal:     General:  Abdomen is flat.     Palpations: There is no mass.     Tenderness: There is no abdominal tenderness. There is no guarding.     Hernia: No hernia is present.  Musculoskeletal:        General: Normal range of motion.     Cervical back: Neck supple.     Right lower leg: No edema.     Left lower leg: No edema.  Lymphadenopathy:     Cervical: No cervical adenopathy.  Skin:    General: Skin is warm.  Neurological:     General: No focal deficit present.     Mental Status: She is alert.  Psychiatric:        Mood and Affect: Mood normal.        Behavior: Behavior normal.     Lab Results  Component Value Date   WBC 6.1 12/03/2021   HGB 13.2 12/03/2021   HCT 39.6 12/03/2021   PLT 240.0 12/03/2021   GLUCOSE 87 12/03/2021   CHOL 133 07/21/2021   TRIG 59 07/21/2021   HDL 49 07/21/2021   LDLDIRECT 92.0 12/15/2016   LDLCALC 71 07/21/2021   ALT 16 07/21/2021   AST 17 07/21/2021   NA 140 12/03/2021   K 4.3 12/03/2021   CL 105 12/03/2021   CREATININE 0.87 12/03/2021   BUN 34 (H) 12/03/2021   CO2 27 12/03/2021   TSH 1.840 05/22/2021   HGBA1C 6.1 12/03/2021    MR Brain Wo Contrast  Result Date: 09/11/2021 CLINICAL DATA:  Provided history: Transient amnesia. Dizziness, nonspecific. EXAM: MRI HEAD WITHOUT CONTRAST TECHNIQUE: Multiplanar, multiecho pulse sequences of the brain and surrounding structures were obtained without intravenous contrast. COMPARISON:  Head CT 06/30/2021. FINDINGS: Brain: Cerebral volume appears normal for age. Mild multifocal T2 FLAIR hyperintense signal abnormality within the cerebral white matter, nonspecific but compatible with chronic small vessel ischemic disease. There is no acute infarct. No evidence of an intracranial mass. No chronic intracranial blood products. No extra-axial fluid collection. No midline shift. Vascular: Maintained flow voids within the proximal large arterial vessels. Skull and upper cervical spine: No focal suspicious marrow lesion.  Sinuses/Orbits: Visualized orbits show no acute finding. Bilateral lens replacements. Trace mucosal thickening within the bilateral ethmoid sinuses. IMPRESSION: No evidence of acute intracranial abnormality. Mild chronic small vessel ischemic changes within the cerebral white matter. Minimal mucosal thickening within the bilateral ethmoid sinuses. Electronically Signed   By: Kellie Simmering D.O.   On: 09/11/2021 07:48    Assessment & Plan:   Loletta was seen today for hypertension, hyperlipidemia and gastroesophageal reflux.  Diagnoses and all orders for this visit:  Flu vaccine need -     Flu Vaccine QUAD  High Dose([place])  Gastroesophageal reflux disease with esophagitis without hemorrhage -     famotidine (PEPCID) 40 MG tablet; Take 1 tablet (40 mg total) by mouth daily. -     CBC with Differential/Platelet; Future  Atherosclerosis of aorta ([place])- Risk factor modifications addressed. -     amLODipine (NORVASC) 5 MG tablet; TAKE 1 TABLET(5 MG) BY MOUTH DAILY -     Lipid panel; Future  Essential hypertension- Her blood pressure is adequately well controlled. -     amLODipine (NORVASC) 5 MG tablet; TAKE 1 TABLET(5 MG) BY MOUTH DAILY -     telmisartan (MICARDIS) 80 MG tablet; Take 1 tablet (80 mg total) by mouth daily. -     Lipid panel; Future  Psychophysiological insomnia -     Suvorexant (BELSOMRA) 15 MG TABS; Take 1 tablet by mouth at bedtime as needed.  Prediabetes -     Basic metabolic panel; Future -     Hemoglobin A1c; Future  Hyperlipidemia with target LDL less than 130 -     pravastatin (PRAVACHOL) 40 MG tablet; Take 1 tablet (40 mg total) by mouth daily.  Other orders -     Discontinue: pravastatin (PRAVACHOL) 40 MG tablet; Take 1 tablet (40 mg total) by mouth daily. -     azithromycin (ZITHROMAX) 500 MG tablet; Take 1 tablet (500 mg total) by mouth daily.   I have changed Tuwanna L. Vandyke's famotidine and telmisartan. I am also having her start on azithromycin.  Additionally, I am having her maintain her fish oil-omega-3 fatty acids, conjugated estrogens, aspirin EC, co-enzyme Q-10, vitamin C, clobetasol ointment, Vitamin D, Arginine, meclizine, clotrimazole-betamethasone, ALPRAZolam, alclomethasone, clobetasol, ciprofloxacin, amLODipine, Belsomra, and pravastatin.  Meds ordered this encounter  Medications   famotidine (PEPCID) 40 MG tablet    Sig: Take 1 tablet (40 mg total) by mouth daily.    Dispense:  90 tablet    Refill:  1   amLODipine (NORVASC) 5 MG tablet    Sig: TAKE 1 TABLET(5 MG) BY MOUTH DAILY    Dispense:  90 tablet    Refill:  1   DISCONTD: pravastatin (PRAVACHOL) 40 MG tablet    Sig: Take 1 tablet (40 mg total) by mouth daily.    Dispense:  90 tablet    Refill:  1   Suvorexant (BELSOMRA) 15 MG TABS    Sig: Take 1 tablet by mouth at bedtime as needed.    Dispense:  30 tablet    Refill:  5   telmisartan (MICARDIS) 80 MG tablet    Sig: Take 1 tablet (80 mg total) by mouth daily.    Dispense:  90 tablet    Refill:  1   azithromycin (ZITHROMAX) 500 MG tablet    Sig: Take 1 tablet (500 mg total) by mouth daily.    Dispense:  3 tablet    Refill:  0   pravastatin (PRAVACHOL) 40 MG tablet    Sig: Take 1 tablet (40 mg total) by mouth daily.    Dispense:  90 tablet    Refill:  1     Follow-up: No follow-ups on file.  Scarlette Calico, MD

## 2022-06-04 NOTE — Patient Instructions (Signed)
Hypertension, Adult High blood pressure (hypertension) is when the force of blood pumping through the arteries is too strong. The arteries are the blood vessels that carry blood from the heart throughout the body. Hypertension forces the heart to work harder to pump blood and may cause arteries to become narrow or stiff. Untreated or uncontrolled hypertension can lead to a heart attack, heart failure, a stroke, kidney disease, and other problems. A blood pressure reading consists of a higher number over a lower number. Ideally, your blood pressure should be below 120/80. The first ("top") number is called the systolic pressure. It is a measure of the pressure in your arteries as your heart beats. The second ("bottom") number is called the diastolic pressure. It is a measure of the pressure in your arteries as the heart relaxes. What are the causes? The exact cause of this condition is not known. There are some conditions that result in high blood pressure. What increases the risk? Certain factors may make you more likely to develop high blood pressure. Some of these risk factors are under your control, including: Smoking. Not getting enough exercise or physical activity. Being overweight. Having too much fat, sugar, calories, or salt (sodium) in your diet. Drinking too much alcohol. Other risk factors include: Having a personal history of heart disease, diabetes, high cholesterol, or kidney disease. Stress. Having a family history of high blood pressure and high cholesterol. Having obstructive sleep apnea. Age. The risk increases with age. What are the signs or symptoms? High blood pressure may not cause symptoms. Very high blood pressure (hypertensive crisis) may cause: Headache. Fast or irregular heartbeats (palpitations). Shortness of breath. Nosebleed. Nausea and vomiting. Vision changes. Severe chest pain, dizziness, and seizures. How is this diagnosed? This condition is diagnosed by  measuring your blood pressure while you are seated, with your arm resting on a flat surface, your legs uncrossed, and your feet flat on the floor. The cuff of the blood pressure monitor will be placed directly against the skin of your upper arm at the level of your heart. Blood pressure should be measured at least twice using the same arm. Certain conditions can cause a difference in blood pressure between your right and left arms. If you have a high blood pressure reading during one visit or you have normal blood pressure with other risk factors, you may be asked to: Return on a different day to have your blood pressure checked again. Monitor your blood pressure at home for 1 week or longer. If you are diagnosed with hypertension, you may have other blood or imaging tests to help your health care provider understand your overall risk for other conditions. How is this treated? This condition is treated by making healthy lifestyle changes, such as eating healthy foods, exercising more, and reducing your alcohol intake. You may be referred for counseling on a healthy diet and physical activity. Your health care provider may prescribe medicine if lifestyle changes are not enough to get your blood pressure under control and if: Your systolic blood pressure is above 130. Your diastolic blood pressure is above 80. Your personal target blood pressure may vary depending on your medical conditions, your age, and other factors. Follow these instructions at home: Eating and drinking  Eat a diet that is high in fiber and potassium, and low in sodium, added sugar, and fat. An example of this eating plan is called the DASH diet. DASH stands for Dietary Approaches to Stop Hypertension. To eat this way: Eat   plenty of fresh fruits and vegetables. Try to fill one half of your plate at each meal with fruits and vegetables. Eat whole grains, such as whole-wheat pasta, brown rice, or whole-grain bread. Fill about one  fourth of your plate with whole grains. Eat or drink low-fat dairy products, such as skim milk or low-fat yogurt. Avoid fatty cuts of meat, processed or cured meats, and poultry with skin. Fill about one fourth of your plate with lean proteins, such as fish, chicken without skin, beans, eggs, or tofu. Avoid pre-made and processed foods. These tend to be higher in sodium, added sugar, and fat. Reduce your daily sodium intake. Many people with hypertension should eat less than 1,500 mg of sodium a day. Do not drink alcohol if: Your health care provider tells you not to drink. You are pregnant, may be pregnant, or are planning to become pregnant. If you drink alcohol: Limit how much you have to: 0-1 drink a day for women. 0-2 drinks a day for men. Know how much alcohol is in your drink. In the [place] one drink equals one 12 oz bottle of beer (355 mL), one 5 oz glass of wine (148 mL), or one 1 oz glass of hard liquor (44 mL). Lifestyle  Work with your health care provider to maintain a healthy body weight or to lose weight. Ask what an ideal weight is for you. Get at least 30 minutes of exercise that causes your heart to beat faster (aerobic exercise) most days of the week. Activities may include walking, swimming, or biking. Include exercise to strengthen your muscles (resistance exercise), such as Pilates or lifting weights, as part of your weekly exercise routine. Try to do these types of exercises for 30 minutes at least 3 days a week. Do not use any products that contain nicotine or tobacco. These products include cigarettes, chewing tobacco, and vaping devices, such as e-cigarettes. If you need help quitting, ask your health care provider. Monitor your blood pressure at home as told by your health care provider. Keep all follow-up visits. This is important. Medicines Take over-the-counter and prescription medicines only as told by your health care provider. Follow directions carefully. Blood  pressure medicines must be taken as prescribed. Do not skip doses of blood pressure medicine. Doing this puts you at risk for problems and can make the medicine less effective. Ask your health care provider about side effects or reactions to medicines that you should watch for. Contact a health care provider if you: Think you are having a reaction to a medicine you are taking. Have headaches that keep coming back (recurring). Feel dizzy. Have swelling in your ankles. Have trouble with your vision. Get help right away if you: Develop a severe headache or confusion. Have unusual weakness or numbness. Feel faint. Have severe pain in your chest or abdomen. Vomit repeatedly. Have trouble breathing. These symptoms may be an emergency. Get help right away. Call 911. Do not wait to see if the symptoms will go away. Do not drive yourself to the hospital. Summary Hypertension is when the force of blood pumping through your arteries is too strong. If this condition is not controlled, it may put you at risk for serious complications. Your personal target blood pressure may vary depending on your medical conditions, your age, and other factors. For most people, a normal blood pressure is less than 120/80. Hypertension is treated with lifestyle changes, medicines, or a combination of both. Lifestyle changes include losing weight, eating a healthy,   low-sodium diet, exercising more, and limiting alcohol. This information is not intended to replace advice given to you by your health care provider. Make sure you discuss any questions you have with your health care provider. Document Revised: 07/01/2021 Document Reviewed: 07/01/2021 Elsevier Patient Education  2023 Elsevier Inc.  

## 2022-06-05 ENCOUNTER — Encounter: Payer: Self-pay | Admitting: Internal Medicine

## 2022-06-29 ENCOUNTER — Other Ambulatory Visit: Payer: Self-pay | Admitting: Internal Medicine

## 2022-06-29 DIAGNOSIS — I10 Essential (primary) hypertension: Secondary | ICD-10-CM

## 2022-07-02 NOTE — Progress Notes (Signed)
Subjective:   Kayla Khan is a 71 y.o. female who presents for Medicare Annual (Subsequent) preventive examination. I connected with  Kayla Khan on 07/06/22 by a audio enabled telemedicine application and verified that I am speaking with the correct person using two identifiers.  Patient Location: Home  Provider Location: Home Office  I discussed the limitations of evaluation and management by telemedicine. The patient expressed understanding and agreed to proceed.  Review of Systems    Deferred to PCP Cardiac Risk Factors include: advanced age (>73mn, >>57women);dyslipidemia;hypertension;obesity (BMI >30kg/m2)     Objective:    There were no vitals filed for this visit. There is no height or weight on file to calculate BMI.     07/06/2022    2:11 PM 07/15/2021    9:17 AM 03/28/2021    4:14 PM 11/22/2020   10:14 AM 12/15/2016    2:51 PM 04/26/2015    8:49 AM  Advanced Directives  Does Patient Have a Medical Advance Directive? Yes Yes No Yes Yes No  Type of Advance Directive    Living will;[place]care [place]   Does patient want to make changes to medical advance directive?    No - Patient declined    [place]?    No - copy requested Yes   Would patient like information on creating a medical advance directive?   No - Patient declined   No - patient declined information    Current Medications (verified) Outpatient Encounter Medications as of 07/06/2022  Medication Sig   alclomethasone (ACLOVATE) 0.05 % cream Apply topically 2 (two) times daily as needed (Rash).   ALPRAZolam (XANAX) 0.25 MG tablet Take 1 tablet (0.25 mg total) by mouth 2 (two) times daily as needed.   amLODipine (NORVASC) 5 MG tablet TAKE 1 TABLET(5 MG) BY MOUTH DAILY   Arginine 1000 MG TABS Take 3,000 mg by mouth in the morning and at bedtime.   Ascorbic Acid (VITAMIN C) 1000 MG tablet Take 1,000 mg by mouth daily.    aspirin EC 81 MG tablet Take 81 mg by mouth daily.   azithromycin (ZITHROMAX) 500 MG tablet Take 1 tablet (500 mg total) by mouth daily.   Cholecalciferol (VITAMIN D) 50 MCG (2000 UT) tablet Take 2,000 Units by mouth daily.   clobetasol (TEMOVATE) 0.05 % external solution Apply 1 application. topically 2 (two) times daily.   clobetasol ointment (TEMOVATE) 06.62% Apply 1 application topically 2 (two) times daily.   clotrimazole-betamethasone (LOTRISONE) cream APPLY TOPICALLY TO THE AFFECTED AREA TWICE DAILY FOR 2 WEEKS   co-enzyme Q-10 30 MG capsule Take 100 mg by mouth daily.   conjugated estrogens (PREMARIN) vaginal cream Place 1 Applicatorful vaginally 2 (two) times a week.    famotidine (PEPCID) 40 MG tablet Take 1 tablet (40 mg total) by mouth daily.   fish oil-omega-3 fatty acids 1000 MG capsule Take 3 g by mouth daily.   meclizine (ANTIVERT) 25 MG tablet Take 25 mg by mouth 3 (three) times daily as needed for dizziness.   pravastatin (PRAVACHOL) 40 MG tablet Take 1 tablet (40 mg total) by mouth daily.   Suvorexant (BELSOMRA) 15 MG TABS Take 1 tablet by mouth at bedtime as needed.   telmisartan (MICARDIS) 80 MG tablet Take 1 tablet (80 mg total) by mouth daily.   ciprofloxacin (CIPRO) 500 MG tablet Take 1 tablet (500 mg total) by mouth 2 (two) times daily. (  Patient not taking: Reported on 07/06/2022)   No facility-administered encounter medications on file as of 07/06/2022.    Allergies (verified) Amoxicillin-pot clavulanate, Beta adrenergic blockers, Crestor [rosuvastatin], Indapamide, Latex, Meperidine hcl, Paxil [paroxetine], and Hctz [hydrochlorothiazide]   History: Past Medical History:  Diagnosis Date   Allergic conjunctivitis    Anemia, iron deficiency    Arthritis    Asthma    Chest tightness or pressure    Presumed microvascular angina,, 2004 normal coronary arteries / nuclear December, 2008, normal   Depression with anxiety    Diverticulosis of colon    DJD  (degenerative joint disease)    Ejection fraction    70%, catheter 2004 / normal LV function nuclear, 2008   Essential hypertension    Fatigue    GERD (gastroesophageal reflux disease)    HLA B27 positive    HLD (hyperlipidemia)    Microvascular angina    a. h/o such - cor CT 03/2020 with minimal CAD, consider noncardiac causes of pain.   Palpitations    Pre-diabetes    Sleep apnea    Thyroid nodule    The patient had a thyroid nodule that was surgically removed in the past.  She has one half of her thyroid and does not take replacement of Taxol and he was benign   Past Surgical History:  Procedure Laterality Date   APPENDECTOMY     BREAST BIOPSY     CESAREAN SECTION     x2   GANGLION CYST EXCISION     THYROIDECTOMY, PARTIAL     left   TUBAL LIGATION     VESICOVAGINAL FISTULA CLOSURE W/ TAH     Family History  Problem Relation Age of Onset   Cancer Mother    Hypertension Father    Aneurysm Father 62       cerebral hemorrhage   Cancer Brother    Stroke Maternal Grandmother    Heart attack Maternal Grandfather    Stroke Paternal Grandfather    Diabetes Other    Heart disease Other    Cancer Other    Arthritis Other    Hypertension Other    Aneurysm Other    Colon cancer Son    Social History   Socioeconomic History   Marital status: Divorced    Spouse name: Not on file   Number of children: 1   Years of education: Not on file   Highest education level: Not on file  Occupational History   Occupation: Pharmacist, hospital  Tobacco Use   Smoking status: Never   Smokeless tobacco: Never  Vaping Use   Vaping Use: Never used  Substance and Sexual Activity   Alcohol use: No   Drug use: No   Sexual activity: Not Currently  Other Topics Concern   Not on file  Social History Narrative   Not on file   Social Determinants of Health   Financial Resource Strain: Low Risk  (07/06/2022)   Overall Financial Resource Strain (CARDIA)    Difficulty of Paying Living Expenses: Not  hard at all  Food Insecurity: No Food Insecurity (07/06/2022)   Hunger Vital Sign    Worried About Running Out of Food in the Last Year: Never true    Ran Out of Food in the Last Year: Never true  Transportation Needs: No Transportation Needs (07/06/2022)   PRAPARE - Hydrologist (Medical): No    Lack of Transportation (Non-Medical): No  Physical Activity: Sufficiently Active (07/06/2022)  Exercise Vital Sign    Days of Exercise per Week: 3 days    Minutes of Exercise per Session: 60 min  Stress: Stress Concern Present (07/06/2022)   [place]    Feeling of Stress : To some extent  Social Connections: Moderately Integrated (07/06/2022)   Social Connection and Isolation Panel [NHANES]    Frequency of Communication with Friends and Family: More than three times a week    Frequency of Social Gatherings with Friends and Family: More than three times a week    Attends Religious Services: More than 4 times per year    Active Member of [employer] or Organizations: Yes    Attends Music therapist: More than 4 times per year    Marital Status: Divorced    Tobacco Counseling Counseling given: Not Answered   Clinical Intake:  Pre-visit preparation completed: Yes  Pain : No/denies pain     Nutritional Status: BMI > 30  Obese Nutritional Risks: None Diabetes: No  How often do you need to have someone help you when you read instructions, pamphlets, or other written materials from your doctor or pharmacy?: 1 - Never  Diabetic?No  Interpreter Needed?: No  Information entered by :: Emelia Loron RN   Activities of Daily Living    07/06/2022    2:10 PM  In your present state of health, do you have any difficulty performing the following activities:  Hearing? 0  Vision? 0  Difficulty concentrating or making decisions? 0  Walking or climbing stairs? 0  Dressing or bathing? 0   Doing errands, shopping? 0  Preparing Food and eating ? N  Using the Toilet? N  In the past six months, have you accidently leaked urine? N  Do you have problems with loss of bowel control? N  Managing your Medications? N  Managing your Finances? N  Housekeeping or managing your Housekeeping? N    Patient Care Team: Janith Lima, MD as PCP - General Meda Coffee Jamse [place], MD as PCP - Cardiology (Cardiology) Marygrace Drought, MD as Consulting Physician (Ophthalmology) Warren Danes, PA-C as Physician Assistant (Dermatology)  Indicate any recent Medical Services you may have received from other than [facility] providers in the past year (date may be approximate).     Assessment:   This is a routine wellness examination for Kayla Khan.  Hearing/Vision screen No results found.  Dietary issues and exercise activities discussed: Current Exercise Habits: Home exercise routine, Type of exercise: walking;strength training/weights;calisthenics, Time (Minutes): 50, Frequency (Times/Week): 3, Weekly Exercise (Minutes/Week): 150, Intensity: Mild, Exercise limited by: orthopedic condition(s)   Goals Addressed             This Visit's Progress    Patient Stated       Continue to participate with Silver Sneakers and go to the gym.       Depression Screen    07/06/2022    2:14 PM 06/04/2022    1:35 PM 11/04/2021   10:51 AM 02/17/2021    9:13 AM 11/22/2020   10:13 AM 08/19/2020    8:09 AM 05/15/2020   10:20 AM  PHQ 2/9 Scores  PHQ - 2 Score 1 0 0 0 0 0 2  PHQ- 9 Score 3 0  0  0 7    Fall Risk    07/06/2022    2:13 PM 11/04/2021   10:51 AM 11/22/2020   10:15 AM 04/01/2020   10:49 AM 03/09/2019  9:50 AM  [place] in the past year? 0 1 0 0 1  Number falls in past yr: 0 0 0 0 0  Injury with Fall? 0 0 0 0 0  Risk for fall due to : History of fall(s);Impaired balance/gait;Impaired mobility No Fall Risks No Fall Risks No Fall Risks   Follow up  Falls evaluation completed Falls  evaluation completed Falls evaluation completed Falls evaluation completed    [place]:  Any stairs in or around the home? Yes  If so, are there any without handrails? Yes  Home free of loose throw rugs in walkways, pet beds, electrical cords, etc? Yes  Adequate lighting in your home to reduce risk of falls? Yes   ASSISTIVE DEVICES UTILIZED TO PREVENT FALLS:  Life alert? No  Use of a cane, walker or w/c? No  Grab bars in the bathroom? Yes  Shower chair or bench in shower? No  Elevated toilet seat or a handicapped toilet? No   Cognitive Function:        07/06/2022    2:12 PM  6CIT Screen  What Year? 0 points  What month? 0 points  What time? 0 points  Count back from 20 0 points  Months in reverse 0 points  Repeat phrase 0 points  Total Score 0 points    Immunizations Immunization History  Administered Date(s) Administered   DTaP 10/18/2004   Fluad Quad(high Dose 65+) 06/04/2022   Influenza Split 07/08/2012, 04/16/2016   Influenza Whole 06/19/2010, 06/30/2011   Influenza, High Dose Seasonal PF 04/24/2017, 06/06/2018, 04/26/2019, 05/07/2020   Influenza,inj,Quad PF,6+ Mos 07/03/2013   Influenza-Unspecified 06/07/2014, 05/24/2017   PFIZER(Purple Top)SARS-COV-2 Vaccination 10/13/2019, 11/07/2019, 06/03/2020   Pneumococcal Conjugate-13 03/22/2013   Pneumococcal Polysaccharide-23 04/28/2007, 12/14/2016, 06/04/2022   Tdap 12/14/2016   Zoster Recombinat (Shingrix) 12/15/2016, 06/16/2017   Zoster, Live 06/19/2010    TDAP status: Up to date  Flu Vaccine status: Up to date  Pneumococcal vaccine status: Up to date  Covid-19 vaccine status: Completed vaccines in 06/03/20; no further boosters  Qualifies for Shingles Vaccine? Yes   Zostavax completed No   Shingrix Completed?: Yes  Screening Tests Health Maintenance  Topic Date Due   Fecal DNA (Cologuard)  02/20/2023   Medicare Annual Wellness (AWV)  07/07/2023   MAMMOGRAM   03/19/2024   TETANUS/TDAP  12/15/2026   Pneumonia Vaccine 24+ Years old  Completed   INFLUENZA VACCINE  Completed   DEXA SCAN  Completed   Hepatitis C Screening  Completed   Zoster Vaccines- Shingrix  Completed   HPV VACCINES  Aged Out   COLONOSCOPY (Pts 45-20yr Insurance coverage will need to be confirmed)  Discontinued   COVID-19 Vaccine  Discontinued    Health Maintenance  There are no preventive care reminders to display for this patient.   Colorectal cancer screening: Type of screening: Cologuard. Completed 02/20/20. Repeat every 3 years  Mammogram status: Completed 03/19/22. Repeat every year  Bone Density status: Completed 04/08/15. Results reflect: Bone density results: NORMAL. Repeat every 2 years.  Lung Cancer Screening: (Low Dose CT Chest recommended if Age 73-80years, 30 pack-year currently smoking OR have quit w/in 15years.) does not qualify.   Additional Screening:  Hepatitis C Screening: does qualify; Completed 11/28/15  Vision Screening: Recommended annual ophthalmology exams for early detection of glaucoma and other disorders of the eye. Is the patient up to date with their annual eye exam?  Yes  Who is the provider  or what is the name of the office in which the patient attends annual eye exams? Dr. Satira Sark If pt is not established with a provider, would they like to be referred to a provider to establish care?  N/A .   Dental Screening: Recommended annual dental exams for proper oral hygiene  Community Resource Referral / Chronic Care Management: CRR required this visit?  No   CCM required this visit?  No      Plan:     I have personally reviewed and noted the following in the patient's chart:   Medical and social history Use of alcohol, tobacco or illicit drugs  Current medications and supplements including opioid prescriptions. Patient is not currently taking opioid prescriptions. Functional ability and status Nutritional status Physical  activity Advanced directives List of other physicians Hospitalizations, surgeries, and ER visits in previous 12 months Vitals Screenings to include cognitive, depression, and falls Referrals and appointments  In addition, I have reviewed and discussed with patient certain preventive protocols, quality metrics, and best practice recommendations. A written personalized care plan for preventive services as well as general preventive health recommendations were provided to patient.     Michiel Cowboy, RN   07/06/2022   Nurse Notes:  Kayla Khan , Thank you for taking time to come for your Medicare Wellness Visit. I appreciate your ongoing commitment to your health goals. Please review the following plan we discussed and let me know if I can assist you in the future.   These are the goals we discussed:  Goals      Patient Stated     I have rejoined the gym and will start doing 2 times a week by using the elliptical, treadmill and doing some stretching.     Patient Stated     Continue to participate with Silver Sneakers and go to the gym.         This is a list of the screening recommended for you and due dates:  Health Maintenance  Topic Date Due   Cologuard (Stool DNA test)  02/20/2023   Medicare Annual Wellness Visit  07/07/2023   Mammogram  03/19/2024   Tetanus Vaccine  12/15/2026   Pneumonia Vaccine  Completed   Flu Shot  Completed   DEXA scan (bone density measurement)  Completed   Hepatitis C Screening: USPSTF Recommendation to screen - Ages 60-79 yo.  Completed   Zoster (Shingles) Vaccine  Completed   HPV Vaccine  Aged Out   Colon Cancer Screening  Discontinued   COVID-19 Vaccine  Discontinued

## 2022-07-02 NOTE — Patient Instructions (Signed)

## 2022-07-06 ENCOUNTER — Ambulatory Visit (INDEPENDENT_AMBULATORY_CARE_PROVIDER_SITE_OTHER): Payer: Medicare PPO | Admitting: *Deleted

## 2022-07-06 DIAGNOSIS — Z0000 Encounter for general adult medical examination without abnormal findings: Secondary | ICD-10-CM | POA: Diagnosis not present

## 2022-07-20 ENCOUNTER — Other Ambulatory Visit: Payer: Self-pay | Admitting: Internal Medicine

## 2022-07-20 DIAGNOSIS — I10 Essential (primary) hypertension: Secondary | ICD-10-CM

## 2022-07-20 DIAGNOSIS — I700 Atherosclerosis of aorta: Secondary | ICD-10-CM

## 2022-07-22 NOTE — Progress Notes (Deleted)
Cardiology Office Note:    Date:  07/22/2022   ID:  Kayla Khan, DOB [birth year], MRN [id number]  PCP:  Kayla Lima, MD   [place] Providers Cardiologist:  Ena Dawley, MD {  Referring MD: Kayla Lima, MD    History of Present Illness:    Kayla Khan is a 71 y.o. 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  She was remotely followed by Dr. Ron Parker and then was followed by Dr. Meda Coffee. She had a normal nuclear stress test in 2008. The patient felt symptomatically improved after starting L-citruline and L-arginine. In 02/2020 she was having some left upper arm pain and chest discomfort with exertion so coronary CTA was performed 03/2020 showing minimal nonobstructive disease with otherwise no significant findings. Regarding lipids, she's previously had intolerance to Crestor so has been managed on pravastatin.   Was last seen in clinic on 04/2021 by Melina Copa, PA where she was doing well from a CV standpoint.  Today, ***  Past Medical History:  Diagnosis Date   Allergic conjunctivitis    Anemia, iron deficiency    Arthritis    Asthma    Chest tightness or pressure    Presumed microvascular angina,, 2004 normal coronary arteries / nuclear December, 2008, normal   Depression with anxiety    Diverticulosis of colon    DJD (degenerative joint disease)    Ejection fraction    70%, catheter 2004 / normal LV function nuclear, 2008   Essential hypertension    Fatigue    GERD (gastroesophageal reflux disease)    HLA B27 positive    HLD (hyperlipidemia)    Microvascular angina    a. h/o such - cor CT 03/2020 with minimal CAD, consider noncardiac causes of pain.   Palpitations    Pre-diabetes     Sleep apnea    Thyroid nodule    The patient had a thyroid nodule that was surgically removed in the past.  She has one half of her thyroid and does not take replacement of Taxol and he was benign    Past Surgical History:  Procedure Laterality Date   APPENDECTOMY     BREAST BIOPSY     CESAREAN SECTION     x2   GANGLION CYST EXCISION     THYROIDECTOMY, PARTIAL     left   TUBAL LIGATION     VESICOVAGINAL FISTULA CLOSURE W/ TAH      Current Medications: No outpatient medications have been marked as taking for the 07/24/22 encounter (Appointment) with Freada Bergeron, MD.     Allergies:   Amoxicillin-pot clavulanate, Beta adrenergic blockers, Crestor [rosuvastatin], Indapamide, Latex, Meperidine hcl, Paxil [paroxetine], and Hctz [hydrochlorothiazide]   Social History   Socioeconomic History   Marital status: Divorced    Spouse name: Not on file   Number of children: 1   Years of education: Not on file   Highest education level: Not on file  Occupational History   Occupation: Pharmacist, hospital  Tobacco Use   Smoking status: Never   Smokeless tobacco: Never  Vaping Use   Vaping Use: Never used  Substance and Sexual Activity   Alcohol use: No   Drug use: No   Sexual activity: Not Currently  Other Topics Concern  Not on file  Social History Narrative   Not on file   Social Determinants of Health   Financial Resource Strain: Low Risk  (07/06/2022)   Overall Financial Resource Strain (CARDIA)    Difficulty of Paying Living Expenses: Not hard at all  Food Insecurity: No Food Insecurity (07/06/2022)   Hunger Vital Sign    Worried About Running Out of Food in the Last Year: Never true    Ran Out of Food in the Last Year: Never true  Transportation Needs: No Transportation Needs (07/06/2022)   PRAPARE - Hydrologist (Medical): No    Lack of Transportation (Non-Medical): No  Physical Activity: Sufficiently Active (07/06/2022)   Exercise Vital  Sign    Days of Exercise per Week: 3 days    Minutes of Exercise per Session: 60 min  Stress: Stress Concern Present (07/06/2022)   [place]    Feeling of Stress : To some extent  Social Connections: Moderately Integrated (07/06/2022)   Social Connection and Isolation Panel [NHANES]    Frequency of Communication with Friends and Family: More than three times a week    Frequency of Social Gatherings with Friends and Family: More than three times a week    Attends Religious Services: More than 4 times per year    Active Member of [employer] or Organizations: Yes    Attends Music therapist: More than 4 times per year    Marital Status: Divorced     Family History: The patient's ***family history includes Aneurysm in an other family member; Aneurysm (age of onset: 78) in her father; Arthritis in an other family member; Cancer in her brother, mother, and another family member; Colon cancer in her son; Diabetes in an other family member; Heart attack in her maternal grandfather; Heart disease in an other family member; Hypertension in her father and another family member; Stroke in her maternal grandmother and paternal grandfather.  ROS:   Please see the history of present illness.    *** All other systems reviewed and are negative.  EKGs/Labs/Other Studies Reviewed:    The following studies were reviewed today: Coronary CTA 04/2021: Cor CT 03/2020 CLINICAL DATA:  71 year old female with h/o hypertension, hyperlipidemia and exertional dyspnea.   EXAM: Cardiac/Coronary  CTA   TECHNIQUE: The patient was scanned on a [employer].   FINDINGS: A 100 kV prospective scan was triggered in the descending thoracic aorta at 111 HU's. Axial non-contrast 3 mm slices were carried out through the heart. The data set was analyzed on a dedicated work station and scored using the [place]. Gantry rotation  speed was 250 msecs and collimation was .6 mm. 10 mg of PO Ivabradine and 0.8 mg of sl NTG was given. The 3D data set was reconstructed in 5% intervals of the 67-82 % of the R-R cycle. Diastolic phases were analyzed on a dedicated work station using MPR, MIP and VRT modes. The patient received 80 cc of contrast.   Aorta: Normal size. Mild diffuse atherosclerotic plaque and calcifications. No dissection.   Aortic Valve:  Trileaflet.  No calcifications.   Coronary Arteries:  Normal coronary origin.  Right dominance.   RCA is a large dominant artery that gives rise to PDA and PLA. There is minimal calcified plaque in the ostial portion with stenosis 0-25%, mid and distal RCA have no significant plaque.   Left main is a large artery that gives  rise to LAD and LCX arteries. Left main has no plaque.   LAD is a medium caliber vessel that gives rise to one diagonal artery. There is minimal calcified plaque in the proximal portion with stenosis 0-25%.   LCX is a non-dominant artery that gives rise to one large OM1 branch. There is no significant plaque.   Other findings:   Normal pulmonary vein drainage into the left atrium.   Normal left atrial appendage without a thrombus.   Normal size of the pulmonary artery.   IMPRESSION: 1. Coronary calcium score of 6.2. This was 19 percentile for age and sex matched control.   2. Normal coronary origin with right dominance.   3. CAD-RADS 1. Minimal non-obstructive CAD (0-24%). Consider non-atherosclerotic causes of chest pain. Consider preventive therapy and risk factor modification.    EKG:  EKG is *** ordered today.  The ekg ordered today demonstrates ***  Recent Labs: 06/04/2022: BUN 26; Creatinine, Ser 0.89; Hemoglobin 13.6; Platelets 256.0; Potassium 4.5; Sodium 140  Recent Lipid Panel    Component Value Date/Time   CHOL 139 06/04/2022 1441   CHOL 133 07/21/2021 0814   TRIG 145.0 06/04/2022 1441   HDL 44.50 06/04/2022 1441    HDL 49 07/21/2021 0814   CHOLHDL 3 06/04/2022 1441   VLDL 29.0 06/04/2022 1441   LDLCALC 66 06/04/2022 1441   LDLCALC 71 07/21/2021 0814   LDLDIRECT 92.0 12/15/2016 0934     Risk Assessment/Calculations:   {Does this patient have ATRIAL FIBRILLATION?:[phone]}  No BP recorded.  {Refresh Note OR Click here to enter BP  :1}***         Physical Exam:    VS:  There were no vitals taken for this visit.    Wt Readings from Last 3 Encounters:  06/04/22 187 lb (84.8 kg)  01/02/22 188 lb 12.8 oz (85.6 kg)  12/03/21 188 lb (85.3 kg)     GEN: *** Well nourished, well developed in no acute distress HEENT: Normal NECK: No JVD; No carotid bruits LYMPHATICS: No lymphadenopathy CARDIAC: ***RRR, no murmurs, rubs, gallops RESPIRATORY:  Clear to auscultation without rales, wheezing or rhonchi  ABDOMEN: Soft, non-tender, non-distended MUSCULOSKELETAL:  No edema; No deformity  SKIN: Warm and dry NEUROLOGIC:  Alert and oriented x 3 PSYCHIATRIC:  Normal affect   ASSESSMENT:    No diagnosis found. PLAN:    In order of problems listed above:  #Mild CAD: Very mild disease noted on Coronary CTA in 2021. Currently doing well without anginal symptoms.  -Continue ASA 37m daily -Continue pravastatin 441mdaily  #Microvascular Angina: Chest pain controlled.  -Continue ASA, prava and amlodipine  #HLD: Did not tolerate crestor. -Continue pravastatin 4037maily -Goal LDL<70  #HTN: -Continue amlodipine 5mg41mily -Continue telmisartan 80mg23mly      {Are you ordering a CV Procedure (e.g. stress test, cath, DCCV, TEE, etc)?   Press F2        :[id number]}edication Adjustments/Labs and Tests Ordered: Current medicines are reviewed at length with the patient today.  Concerns regarding medicines are outlined above.  No orders of the defined types were placed in this encounter.  No orders of the defined types were placed in this encounter.   There are no Patient  Instructions on file for this visit.   Signed, HeathFreada Bergeron 07/22/2022 1:08 PM    [facility]

## 2022-07-24 ENCOUNTER — Ambulatory Visit: Payer: Medicare PPO | Attending: Cardiology | Admitting: Cardiology

## 2022-07-24 ENCOUNTER — Encounter: Payer: Self-pay | Admitting: Cardiology

## 2022-07-24 VITALS — BP 128/76 | HR 82 | Ht 64.0 in | Wt 185.4 lb

## 2022-07-24 DIAGNOSIS — E785 Hyperlipidemia, unspecified: Secondary | ICD-10-CM | POA: Diagnosis not present

## 2022-07-24 DIAGNOSIS — I2089 Other forms of angina pectoris: Secondary | ICD-10-CM

## 2022-07-24 DIAGNOSIS — I10 Essential (primary) hypertension: Secondary | ICD-10-CM

## 2022-07-24 DIAGNOSIS — I2510 Atherosclerotic heart disease of native coronary artery without angina pectoris: Secondary | ICD-10-CM

## 2022-07-24 DIAGNOSIS — I25110 Atherosclerotic heart disease of native coronary artery with unstable angina pectoris: Secondary | ICD-10-CM | POA: Diagnosis not present

## 2022-07-24 NOTE — Patient Instructions (Signed)
Medication Instructions:   Your physician recommends that you continue on your current medications as directed. Please refer to the Current Medication list given to you today.  *If you need a refill on your cardiac medications before your next appointment, please call your pharmacy*   Follow-Up: At [facility], you and your health needs are our priority.  As part of our continuing mission to provide you with exceptional heart care, we have created designated Provider Care Teams.  These Care Teams include your primary Cardiologist (physician) and Advanced Practice Providers (APPs -  Physician Assistants and Nurse Practitioners) who all work together to provide you with the care you need, when you need it.  We recommend signing up for the patient portal called "MyChart".  Sign up information is provided on this After Visit Summary.  MyChart is used to connect with patients for Virtual Visits (Telemedicine).  Patients are able to view lab/test results, encounter notes, upcoming appointments, etc.  Non-urgent messages can be sent to your provider as well.   To learn more about what you can do with MyChart, go to [email or web address].    Your next appointment:   1 year(s)  The format for your next appointment:   In Person  Provider:   Dr. Johney Frame   Important Information About Sugar

## 2022-07-24 NOTE — Progress Notes (Signed)
Cardiology Office Note:    Date:  07/24/2022   ID:  Kayla Khan, DOB Mar 24, 1951, MRN [id number]  PCP:  Janith Lima, MD   [place] Providers Cardiologist:  Ena Dawley, MD {  Referring MD: Janith Lima, MD    History of Present Illness:    Kayla Khan is a 71 y.o. 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  She was remotely followed by Dr. Ron Parker and then was followed by Dr. Meda Coffee. She had a normal nuclear stress test in 2008. The patient felt symptomatically improved after starting L-citruline and L-arginine. In 02/2020 she was having some left upper arm pain and chest discomfort with exertion so coronary CTA was performed 03/2020 showing minimal nonobstructive disease with otherwise no significant findings. Regarding lipids, she's previously had intolerance to Crestor so has been managed on pravastatin.   Was last seen in clinic on 04/2021 by Melina Copa, PA where she was doing well from a CV standpoint.  Today, she says she has been feeling well. States she has been doing well from a CV standpoint. She retired in June of 2023 and began going to the gym about 3 times a week using a stationary bike, the elliptical, and lifting weights. She states that the only type of exercise that causes her chest discomfort is going up hills, but otherwise her angina is very well managed.  She denies any palpitations, shortness of breath, or peripheral edema. No lightheadedness, headaches, syncope, orthopnea, or PND.  Past Medical History:  Diagnosis Date   Allergic conjunctivitis    Anemia, iron deficiency    Arthritis    Asthma    Chest tightness or pressure    Presumed microvascular angina,, 2004 normal  coronary arteries / nuclear December, 2008, normal   Depression with anxiety    Diverticulosis of colon    DJD (degenerative joint disease)    Ejection fraction    70%, catheter 2004 / normal LV function nuclear, 2008   Essential hypertension    Fatigue    GERD (gastroesophageal reflux disease)    HLA B27 positive    HLD (hyperlipidemia)    Microvascular angina    a. h/o such - cor CT 03/2020 with minimal CAD, consider noncardiac causes of pain.   Palpitations    Pre-diabetes    Sleep apnea    Thyroid nodule    The patient had a thyroid nodule that was surgically removed in the past.  She has one half of her thyroid and does not take replacement of Taxol and he was benign    Past Surgical History:  Procedure Laterality Date   APPENDECTOMY     BREAST BIOPSY     CESAREAN SECTION     x2   GANGLION CYST EXCISION     THYROIDECTOMY, PARTIAL     left   TUBAL LIGATION     VESICOVAGINAL FISTULA CLOSURE W/ TAH      Current Medications: Current Meds  Medication Sig   alclomethasone (ACLOVATE) 0.05 % cream Apply topically 2 (two) times daily as needed (Rash).   ALPRAZolam (XANAX) 0.25 MG tablet Take 1 tablet (0.25 mg total) by mouth 2 (two) times daily as needed.   amLODipine (NORVASC) 5 MG tablet TAKE 1 TABLET(5 MG) BY  MOUTH DAILY   Arginine 1000 MG TABS Take 3,000 mg by mouth in the morning and at bedtime.   Ascorbic Acid (VITAMIN C) 1000 MG tablet Take 1,000 mg by mouth daily.   aspirin EC 81 MG tablet Take 81 mg by mouth daily.   azithromycin (ZITHROMAX) 500 MG tablet Take 1 tablet (500 mg total) by mouth daily.   Cholecalciferol (VITAMIN D) 50 MCG (2000 UT) tablet Take 2,000 Units by mouth daily.   clobetasol ointment (TEMOVATE) 7.02 % Apply 1 application topically 2 (two) times daily.   clotrimazole-betamethasone (LOTRISONE) cream APPLY TOPICALLY TO THE AFFECTED AREA TWICE DAILY FOR 2 WEEKS   co-enzyme Q-10 30 MG capsule Take 100 mg by mouth daily.   conjugated estrogens  (PREMARIN) vaginal cream Place 1 Applicatorful vaginally 2 (two) times a week.    famotidine (PEPCID) 40 MG tablet Take 1 tablet (40 mg total) by mouth daily.   fish oil-omega-3 fatty acids 1000 MG capsule Take 3 g by mouth daily.   meclizine (ANTIVERT) 25 MG tablet Take 25 mg by mouth 3 (three) times daily as needed for dizziness.   pravastatin (PRAVACHOL) 40 MG tablet Take 1 tablet (40 mg total) by mouth daily.   Suvorexant (BELSOMRA) 15 MG TABS Take 1 tablet by mouth at bedtime as needed.   telmisartan (MICARDIS) 80 MG tablet Take 1 tablet (80 mg total) by mouth daily.     Allergies:   Amoxicillin-pot clavulanate, Beta adrenergic blockers, Crestor [rosuvastatin], Indapamide, Latex, Meperidine hcl, Paxil [paroxetine], and Hctz [hydrochlorothiazide]   Social History   Socioeconomic History   Marital status: Divorced    Spouse name: Not on file   Number of children: 1   Years of education: Not on file   Highest education level: Not on file  Occupational History   Occupation: Pharmacist, hospital  Tobacco Use   Smoking status: Never   Smokeless tobacco: Never  Vaping Use   Vaping Use: Never used  Substance and Sexual Activity   Alcohol use: No   Drug use: No   Sexual activity: Not Currently  Other Topics Concern   Not on file  Social History Narrative   Not on file   Social Determinants of Health   Financial Resource Strain: Low Risk  (07/06/2022)   Overall Financial Resource Strain (CARDIA)    Difficulty of Paying Living Expenses: Not hard at all  Food Insecurity: No Food Insecurity (07/06/2022)   Hunger Vital Sign    Worried About Running Out of Food in the Last Year: Never true    Ran Out of Food in the Last Year: Never true  Transportation Needs: No Transportation Needs (07/06/2022)   PRAPARE - Hydrologist (Medical): No    Lack of Transportation (Non-Medical): No  Physical Activity: Sufficiently Active (07/06/2022)   Exercise Vital Sign    Days of  Exercise per Week: 3 days    Minutes of Exercise per Session: 60 min  Stress: Stress Concern Present (07/06/2022)   [place]    Feeling of Stress : To some extent  Social Connections: Moderately Integrated (07/06/2022)   Social Connection and Isolation Panel [NHANES]    Frequency of Communication with Friends and Family: More than three times a week    Frequency of Social Gatherings with Friends and Family: More than three times a week    Attends Religious Services: More than 4 times per year    Active Member of  Clubs or Organizations: Yes    Attends Music therapist: More than 4 times per year    Marital Status: Divorced     Family History: The patient's family history includes Aneurysm in an other family member; Aneurysm (age of onset: 66) in her father; Arthritis in an other family member; Cancer in her brother, mother, and another family member; Colon cancer in her son; Diabetes in an other family member; Heart attack in her maternal grandfather; Heart disease in an other family member; Hypertension in her father and another family member; Stroke in her maternal grandmother and paternal grandfather.  ROS:   Please see the history of present illness.    (+) Exertional chest discomfort   All other systems reviewed and are negative.  EKGs/Labs/Other Studies Reviewed:    The following studies were reviewed today:  Coronary CTA 04/2021: Cor CT 03/2020 CLINICAL DATA:  71 year old female with h/o hypertension, hyperlipidemia and exertional dyspnea.   EXAM: Cardiac/Coronary  CTA   TECHNIQUE: The patient was scanned on a [employer].   FINDINGS: A 100 kV prospective scan was triggered in the descending thoracic aorta at 111 HU's. Axial non-contrast 3 mm slices were carried out through the heart. The data set was analyzed on a dedicated work station and scored using the [place].  Gantry rotation speed was 250 msecs and collimation was .6 mm. 10 mg of PO Ivabradine and 0.8 mg of sl NTG was given. The 3D data set was reconstructed in 5% intervals of the 67-82 % of the R-R cycle. Diastolic phases were analyzed on a dedicated work station using MPR, MIP and VRT modes. The patient received 80 cc of contrast.   Aorta: Normal size. Mild diffuse atherosclerotic plaque and calcifications. No dissection.   Aortic Valve:  Trileaflet.  No calcifications.   Coronary Arteries:  Normal coronary origin.  Right dominance.   RCA is a large dominant artery that gives rise to PDA and PLA. There is minimal calcified plaque in the ostial portion with stenosis 0-25%, mid and distal RCA have no significant plaque.   Left main is a large artery that gives rise to LAD and LCX arteries. Left main has no plaque.   LAD is a medium caliber vessel that gives rise to one diagonal artery. There is minimal calcified plaque in the proximal portion with stenosis 0-25%.   LCX is a non-dominant artery that gives rise to one large OM1 branch. There is no significant plaque.   Other findings:   Normal pulmonary vein drainage into the left atrium.   Normal left atrial appendage without a thrombus.   Normal size of the pulmonary artery.   IMPRESSION: 1. Coronary calcium score of 6.2. This was 75 percentile for age and sex matched control.   2. Normal coronary origin with right dominance.   3. CAD-RADS 1. Minimal non-obstructive CAD (0-24%). Consider non-atherosclerotic causes of chest pain. Consider preventive therapy and risk factor modification.    EKG: EKG is personally reviewed. 07/24/22: Sinus rhythm. Rate 82 bpm.    Recent Labs: 06/04/2022: BUN 26; Creatinine, Ser 0.89; Hemoglobin 13.6; Platelets 256.0; Potassium 4.5; Sodium 140  Recent Lipid Panel    Component Value Date/Time   CHOL 139 06/04/2022 1441   CHOL 133 07/21/2021 0814   TRIG 145.0 06/04/2022 1441   HDL 44.50  06/04/2022 1441   HDL 49 07/21/2021 0814   CHOLHDL 3 06/04/2022 1441   VLDL 29.0 06/04/2022 1441   LDLCALC 66 06/04/2022 1441  [place] 71 07/21/2021 0814   LDLDIRECT 92.0 12/15/2016 0934     Risk Assessment/Calculations:                Physical Exam:    VS:  BP 128/76   Pulse 82   Ht _0  (1.626 m)   Wt 185 lb 6.4 oz (84.1 kg)   SpO2 96%   BMI 31.82 kg/m     Wt Readings from Last 3 Encounters:  07/24/22 185 lb 6.4 oz (84.1 kg)  06/04/22 187 lb (84.8 kg)  01/02/22 188 lb 12.8 oz (85.6 kg)     GEN: Well nourished, well developed in no acute distress HEENT: Normal NECK: No JVD; No carotid bruits CARDIAC: RRR, 1/6 systolic murmur. No rubs, gallops RESPIRATORY:  Clear to auscultation without rales, wheezing or rhonchi  ABDOMEN: Soft, non-tender, non-distended MUSCULOSKELETAL:  No edema; No deformity  SKIN: Warm and dry NEUROLOGIC:  Alert and oriented x 3 PSYCHIATRIC:  Normal affect   ASSESSMENT:    1. Mild CAD   2. Hyperlipidemia LDL goal <70   3. Essential hypertension   4. Microvascular angina     PLAN:    In order of problems listed above:  #Mild CAD: Very mild disease noted on Coronary CTA in 2021. Currently doing well without anginal symptoms.  -Continue ASA 51m daily -Continue pravastatin 448mdaily  #Microvascular Angina: Chest pain controlled.  -Continue ASA 8125maily, prava 20m66mily and amlodipine 5mg 67mly  #HLD: Did not tolerate crestor. -Continue pravastatin 20mg 36my -LDL at 66 on 05/2022 -Goal LDL<70  #HTN: Well controlled and at goal. -Continue amlodipine 5mg da33m -Continue telmisartan 80mg da61m     Follow up: 1 year.  Medication Adjustments/Labs and Tests Ordered: Current medicines are reviewed at length with the patient today.  Concerns regarding medicines are outlined above.  Orders Placed This Encounter  Procedures   EKG 12-Lead   No orders of the defined types were placed in this encounter.   Patient  Instructions  Medication Instructions:   Your physician recommends that you continue on your current medications as directed. Please refer to the Current Medication list given to you today.  *If you need a refill on your cardiac medications before your next appointment, please call your pharmacy*   Follow-Up: [facility] your [facility] needs are our priority.  As part of our continuing mission to provide you with exceptional heart care, we have created designated Provider Care Teams.  These Care Teams include your primary Cardiologist (physician) and Advanced Practice Providers (APPs -  Physician Assistants and Nurse Practitioners) who all work together to provide you with the care you need, when you need it.  We recommend signing up for the patient portal called "MyChart".  Sign up information is provided on this After Visit Summary.  MyChart is used to connect with patients for Virtual Visits (Telemedicine).  Patients are able to view lab/test results, encounter notes, upcoming appointments, etc.  Non-urgent messages can be sent to your provider as well.   To learn more about what you can do with MyChart, go to [email or web address] next appointment:   1 year(s)  The format for your next appointment:   In Person  Provider:   Dr. PembertoJohney Frametant Information About Sugar          I,Breanna Adamick,acting as a scribe for Gawain Crombie Freada Bergeronve documented all relevant documentation on the behalf of Asiah Browder Freada Bergeron  MD,as directed by  Freada Bergeron, MD while in the presence of Freada Bergeron, MD.  I, Freada Bergeron, MD, have reviewed all documentation for this visit. The documentation on 07/24/22 for the exam, diagnosis, procedures, and orders are all accurate and complete.   Signed, Freada Bergeron, MD  07/24/2022 9:42 AM    [place]

## 2022-08-20 DIAGNOSIS — J209 Acute bronchitis, unspecified: Secondary | ICD-10-CM | POA: Insufficient documentation

## 2022-08-24 ENCOUNTER — Ambulatory Visit: Payer: Medicare PPO | Admitting: Internal Medicine

## 2022-09-23 DIAGNOSIS — M48061 Spinal stenosis, lumbar region without neurogenic claudication: Secondary | ICD-10-CM | POA: Diagnosis not present

## 2022-11-03 DIAGNOSIS — M25562 Pain in left knee: Secondary | ICD-10-CM | POA: Diagnosis not present

## 2022-11-03 DIAGNOSIS — M67912 Unspecified disorder of synovium and tendon, left shoulder: Secondary | ICD-10-CM | POA: Diagnosis not present

## 2022-11-12 DIAGNOSIS — M7542 Impingement syndrome of left shoulder: Secondary | ICD-10-CM | POA: Diagnosis not present

## 2022-11-16 IMAGING — MR MR HEAD W/O CM
10 series · 48 of 48 positions shown · non-contrast
Comparison: Head CT 06/30/2021.

CLINICAL DATA: Provided history: Transient amnesia. Dizziness,
nonspecific.

EXAM:
MRI HEAD WITHOUT CONTRAST
TECHNIQUE: Multiplanar, multiecho pulse sequences of the brain and surrounding
structures were obtained without intravenous contrast.

[Series 2: T1 · sagittal · 5.0mm · 0.45mm/px · 2 of 21 slices shown]
[im 1/21]
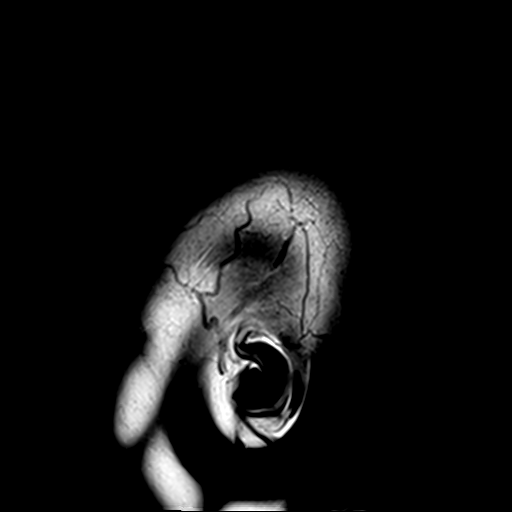
[im 21/21]
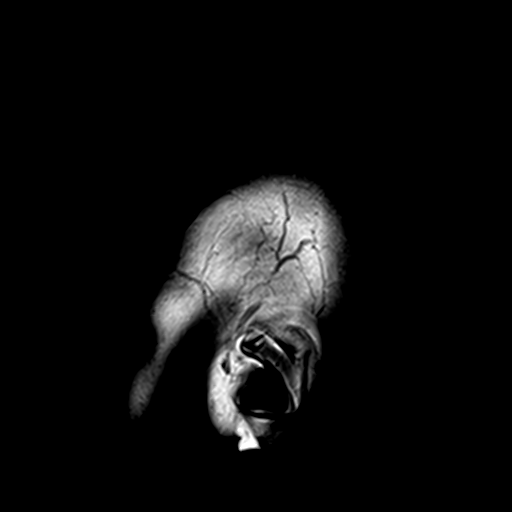

[Series 3: DWI · axial · 3.0mm · 1.80mm/px · z∈[-66,+87]mm · 9 of 104 slices shown (1 of 4)]
[im 1/104]
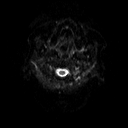
[im 13/104]
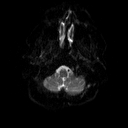
[im 26/104]
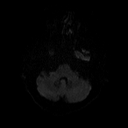
[im 39/104]
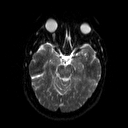
[im 52/104]
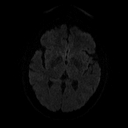
[im 65/104]
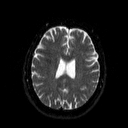
[im 78/104]
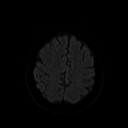
[im 91/104]
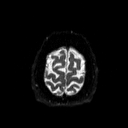
[im 104/104]
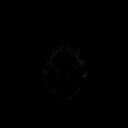

[Series 4: DWI · axial · 3.0mm · 1.80mm/px · z∈[-66,+87]mm · 5 of 51 slices shown (2 of 4)]
[im 1/51]
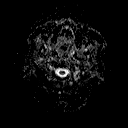
[im 13/51]
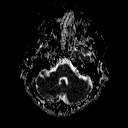
[im 26/51]
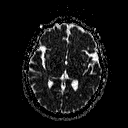
[im 38/51]
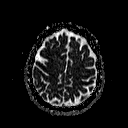
[im 51/51]
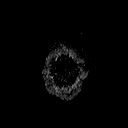

[Series 5: DWI · coronal · 5.0mm · 1.80mm/px · 6 of 66 slices shown (3 of 4)]
[im 1/66]
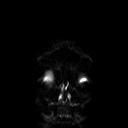
[im 14/66]
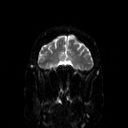
[im 27/66]
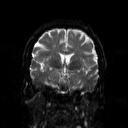
[im 40/66]
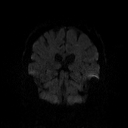
[im 53/66]
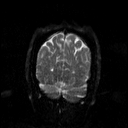
[im 66/66]
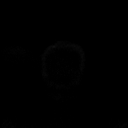

[Series 6: DWI · coronal · 5.0mm · 1.80mm/px · 3 of 34 slices shown (4 of 4)]
[im 1/34]
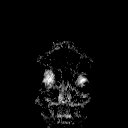
[im 17/34]
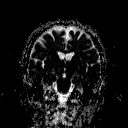
[im 34/34]
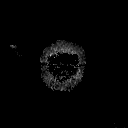

[Series 7: T2 · axial · 5.0mm · 0.51mm/px · z∈[-71,+83]mm · 2 of 23 slices shown (1 of 2)]
[im 1/23]
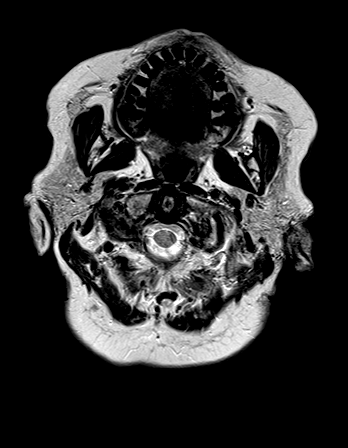
[im 23/23]
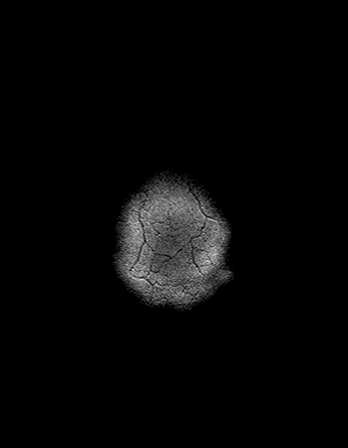

[Series 8: FLAIR · axial · 3.0mm · 0.45mm/px · z∈[-61,+74]mm · 3 of 30 slices shown]
[im 1/30]
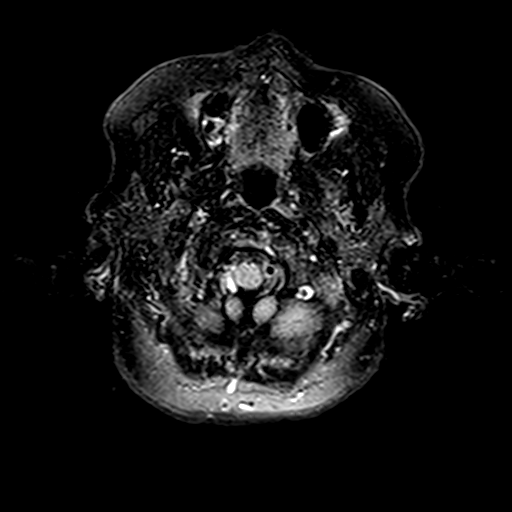
[im 15/30]
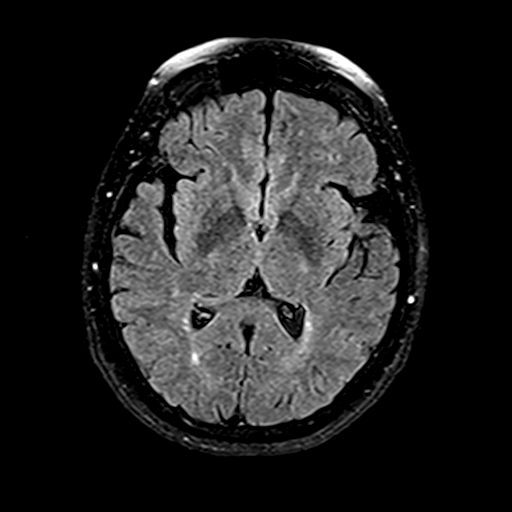
[im 30/30]
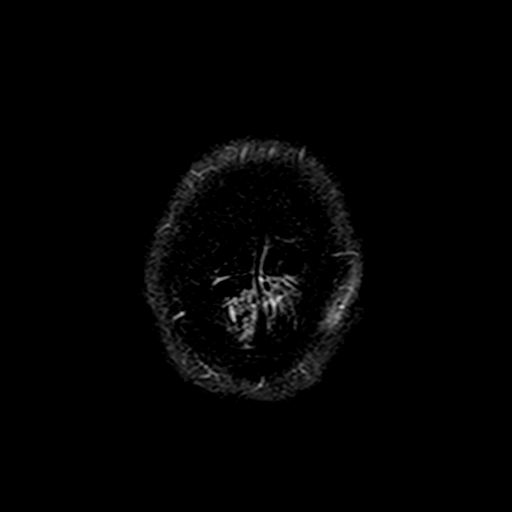

[Series 10: swi_images · axial · 4.0mm · 0.90mm/px · z∈[-64,+76]mm · 3 of 36 slices shown]
[im 1/36]
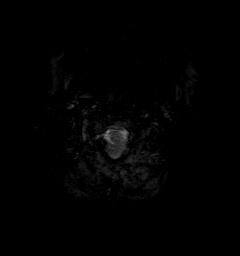
[im 18/36]
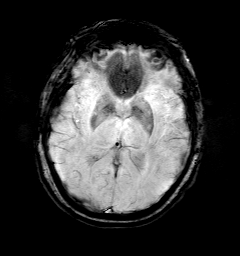
[im 36/36]
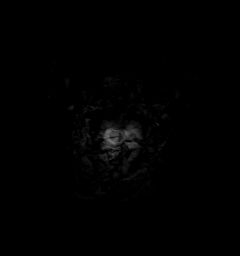

[Series 11: t1_mpr_tra · axial · 1.0mm · 0.71mm/px · z∈[-66,+77]mm · 13 of 144 slices shown]
[im 1/144]
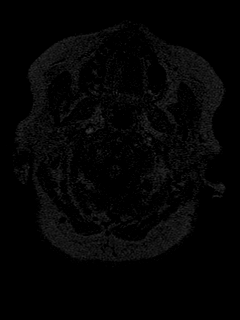
[im 12/144]
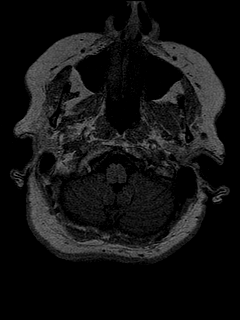
[im 24/144]
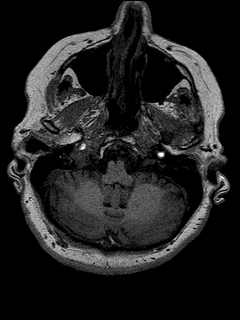
[im 36/144]
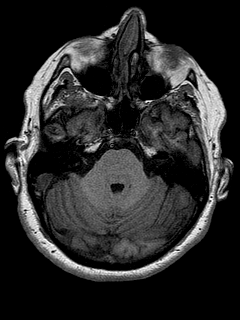
[im 48/144]
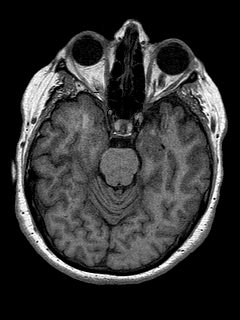
[im 60/144]
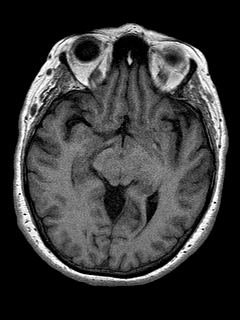
[im 72/144]
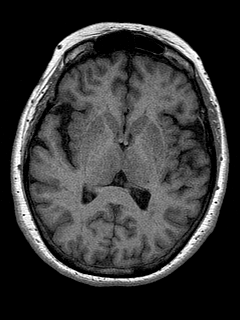
[im 84/144]
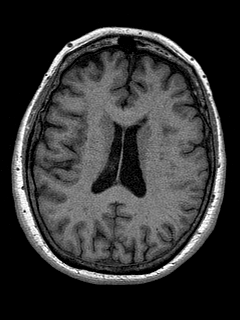
[im 96/144]
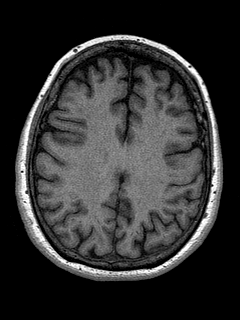
[im 108/144]
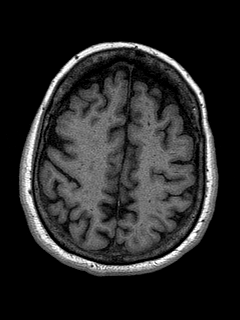
[im 120/144]
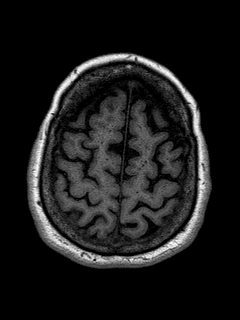
[im 132/144]
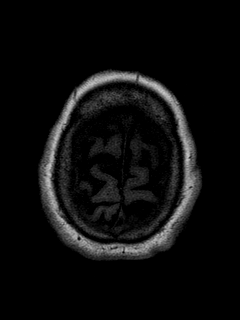
[im 144/144]
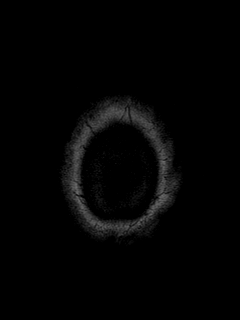

[Series 12: T2 · coronal · 5.0mm · 0.45mm/px · 2 of 26 slices shown (2 of 2)]
[im 1/26]
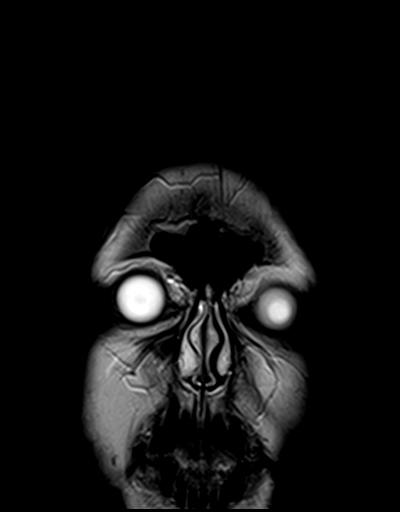
[im 26/26]
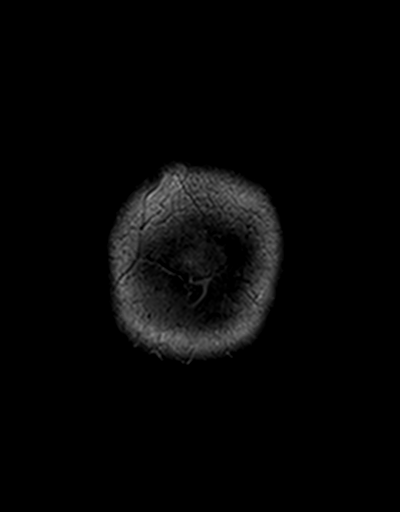

[48 of 48 positions shown; findings below may reference images not displayed]

FINDINGS: Brain:

Cerebral volume appears normal for age.

Mild multifocal T2 FLAIR hyperintense signal abnormality within the
cerebral white matter, nonspecific but compatible with chronic small
vessel ischemic disease.

There is no acute infarct.

No evidence of an intracranial mass.

No chronic intracranial blood products.

No extra-axial fluid collection.

No midline shift.

Vascular: Maintained flow voids within the proximal large arterial
vessels.

Skull and upper cervical spine: No focal suspicious marrow lesion.

Sinuses/Orbits: Visualized orbits show no acute finding. Bilateral
lens replacements. Trace mucosal thickening within the bilateral
ethmoid sinuses.
IMPRESSION: No evidence of acute intracranial abnormality.

Mild chronic small vessel ischemic changes within the cerebral white
matter.

Minimal mucosal thickening within the bilateral ethmoid sinuses.

## 2022-11-18 DIAGNOSIS — G4733 Obstructive sleep apnea (adult) (pediatric): Secondary | ICD-10-CM | POA: Diagnosis not present

## 2022-11-19 DIAGNOSIS — M7542 Impingement syndrome of left shoulder: Secondary | ICD-10-CM | POA: Diagnosis not present

## 2022-11-24 DIAGNOSIS — M7542 Impingement syndrome of left shoulder: Secondary | ICD-10-CM | POA: Diagnosis not present

## 2022-11-25 ENCOUNTER — Ambulatory Visit: Payer: Medicare PPO | Admitting: Internal Medicine

## 2022-11-25 VITALS — BP 124/70 | HR 76 | Temp 98.2°F | Ht 64.0 in | Wt 192.0 lb

## 2022-11-25 DIAGNOSIS — L219 Seborrheic dermatitis, unspecified: Secondary | ICD-10-CM | POA: Insufficient documentation

## 2022-11-25 DIAGNOSIS — I10 Essential (primary) hypertension: Secondary | ICD-10-CM

## 2022-11-25 DIAGNOSIS — N1831 Chronic kidney disease, stage 3a: Secondary | ICD-10-CM | POA: Diagnosis not present

## 2022-11-25 DIAGNOSIS — R7303 Prediabetes: Secondary | ICD-10-CM | POA: Diagnosis not present

## 2022-11-25 DIAGNOSIS — D508 Other iron deficiency anemias: Secondary | ICD-10-CM | POA: Diagnosis not present

## 2022-11-25 DIAGNOSIS — I700 Atherosclerosis of aorta: Secondary | ICD-10-CM | POA: Diagnosis not present

## 2022-11-25 DIAGNOSIS — E785 Hyperlipidemia, unspecified: Secondary | ICD-10-CM | POA: Diagnosis not present

## 2022-11-25 DIAGNOSIS — N3281 Overactive bladder: Secondary | ICD-10-CM

## 2022-11-25 DIAGNOSIS — L233 Allergic contact dermatitis due to drugs in contact with skin: Secondary | ICD-10-CM

## 2022-11-25 LAB — URINALYSIS, ROUTINE W REFLEX MICROSCOPIC
Bilirubin Urine: NEGATIVE
Hgb urine dipstick: NEGATIVE
Ketones, ur: NEGATIVE
Leukocytes,Ua: NEGATIVE
Nitrite: NEGATIVE
Specific Gravity, Urine: 1.025 (ref 1.000–1.030)
Total Protein, Urine: NEGATIVE
Urine Glucose: NEGATIVE
Urobilinogen, UA: 0.2 (ref 0.0–1.0)
pH: 5.5 (ref 5.0–8.0)

## 2022-11-25 LAB — CBC WITH DIFFERENTIAL/PLATELET
Basophils Absolute: 0.1 10*3/uL (ref 0.0–0.1)
Basophils Relative: 1.1 % (ref 0.0–3.0)
Eosinophils Absolute: 0.2 10*3/uL (ref 0.0–0.7)
Eosinophils Relative: 3.6 % (ref 0.0–5.0)
HCT: 39.3 % (ref 36.0–46.0)
Hemoglobin: 13.2 g/dL (ref 12.0–15.0)
Lymphocytes Relative: 31.9 % (ref 12.0–46.0)
Lymphs Abs: 1.7 10*3/uL (ref 0.7–4.0)
MCHC: 33.7 g/dL (ref 30.0–36.0)
MCV: 95.1 fl (ref 78.0–100.0)
Monocytes Absolute: 0.5 10*3/uL (ref 0.1–1.0)
Monocytes Relative: 8.8 % (ref 3.0–12.0)
Neutro Abs: 2.9 10*3/uL (ref 1.4–7.7)
Neutrophils Relative %: 54.6 % (ref 43.0–77.0)
Platelets: 240 10*3/uL (ref 150.0–400.0)
RBC: 4.13 Mil/uL (ref 3.87–5.11)
RDW: 13.4 % (ref 11.5–15.5)
WBC: 5.3 10*3/uL (ref 4.0–10.5)

## 2022-11-25 LAB — TSH: TSH: 1.62 u[IU]/mL (ref 0.35–5.50)

## 2022-11-25 LAB — BASIC METABOLIC PANEL
BUN: 34 mg/dL — ABNORMAL HIGH (ref 6–23)
CO2: 25 mEq/L (ref 19–32)
Calcium: 9.5 mg/dL (ref 8.4–10.5)
Chloride: 108 mEq/L (ref 96–112)
Creatinine, Ser: 0.94 mg/dL (ref 0.40–1.20)
GFR: 59.93 mL/min — ABNORMAL LOW (ref >60.00)
Glucose, Bld: 97 mg/dL (ref 70–99)
Potassium: 4.5 mEq/L (ref 3.5–5.1)
Sodium: 138 mEq/L (ref 135–145)

## 2022-11-25 LAB — HEPATIC FUNCTION PANEL
ALT: 18 U/L (ref 0–35)
AST: 16 U/L (ref 0–37)
Albumin: 4.1 g/dL (ref 3.5–5.2)
Alkaline Phosphatase: 80 U/L (ref 39–117)
Bilirubin, Direct: 0.1 mg/dL (ref 0.0–0.3)
Total Bilirubin: 0.4 mg/dL (ref 0.2–1.2)
Total Protein: 7.3 g/dL (ref 6.0–8.3)

## 2022-11-25 LAB — HEMOGLOBIN A1C: Hgb A1c MFr Bld: 6 % (ref 4.6–6.5)

## 2022-11-25 MED ORDER — CICLOPIROX OLAMINE 0.77 % EX CREA: TOPICAL_CREAM | Freq: Two times a day (BID) | CUTANEOUS | 2 refills | Status: AC

## 2022-11-25 MED ORDER — SOLIFENACIN SUCCINATE 10 MG PO TABS: 10.0000 mg | ORAL_TABLET | Freq: Every day | ORAL | 1 refills | Status: DC

## 2022-11-25 MED ORDER — DESONIDE 0.05 % EX CREA: TOPICAL_CREAM | Freq: Two times a day (BID) | CUTANEOUS | 2 refills | Status: AC

## 2022-11-25 NOTE — Progress Notes (Addendum)
Subjective:  Patient ID: Kayla Khan, female    DOB: 11-13-1950  Age: 72 y.o. MRN: [id number]  CC: Hypertension and Rash   HPI Kayla Khan presents for f/up ---  She complains of red/flaky facial rash for 4 months - she has used neosporin ointment and lotion with no relief.  Outpatient Medications Prior to Visit  Medication Sig Dispense Refill   ALPRAZolam (XANAX) 0.25 MG tablet Take 1 tablet (0.25 mg total) by mouth 2 (two) times daily as needed. 35 tablet 2   amLODipine (NORVASC) 5 MG tablet TAKE 1 TABLET(5 MG) BY MOUTH DAILY 90 tablet 1   Arginine 1000 MG TABS Take 3,000 mg by mouth in the morning and at bedtime.     Ascorbic Acid (VITAMIN C) 1000 MG tablet Take 1,000 mg by mouth daily.     aspirin EC 81 MG tablet Take 81 mg by mouth daily.     Cholecalciferol (VITAMIN D) 50 MCG (2000 UT) tablet Take 2,000 Units by mouth daily.     clobetasol ointment (TEMOVATE) [id number] % Apply 1 application topically 2 (two) times daily. 30 g 2   co-enzyme Q-10 30 MG capsule Take 100 mg by mouth daily.     conjugated estrogens (PREMARIN) vaginal cream Place 1 Applicatorful vaginally 2 (two) times a week.      famotidine (PEPCID) 40 MG tablet Take 1 tablet (40 mg total) by mouth daily. 90 tablet 1   fish oil-omega-3 fatty acids 1000 MG capsule Take 3 g by mouth daily.     meclizine (ANTIVERT) 25 MG tablet Take 25 mg by mouth 3 (three) times daily as needed for dizziness.     pravastatin (PRAVACHOL) 40 MG tablet Take 1 tablet (40 mg total) by mouth daily. 90 tablet 1   Suvorexant (BELSOMRA) 15 MG TABS Take 1 tablet by mouth at bedtime as needed. 30 tablet 5   telmisartan (MICARDIS) 80 MG tablet Take 1 tablet (80 mg total) by mouth daily. 90 tablet 1   alclomethasone (ACLOVATE) 0.05 % cream Apply topically 2 (two) times daily as needed (Rash). 180 g 3   azithromycin (ZITHROMAX) 500 MG tablet Take 1 tablet (500 mg total) by mouth daily. 3 tablet 0   clotrimazole-betamethasone  (LOTRISONE) cream APPLY TOPICALLY TO THE AFFECTED AREA TWICE DAILY FOR 2 WEEKS 30 g 0   No facility-administered medications prior to visit.    ROS Review of Systems  Constitutional:  Positive for unexpected weight change (wt gain). Negative for appetite change, chills, diaphoresis and fatigue.  HENT: Negative.    Eyes: Negative.   Respiratory:  Negative for cough, chest tightness, shortness of breath and wheezing.   Cardiovascular:  Positive for leg swelling. Negative for chest pain and palpitations.  Gastrointestinal:  Negative for abdominal pain, diarrhea, nausea and vomiting.  Endocrine: Negative.   Genitourinary:  Positive for frequency and urgency. Negative for decreased urine volume, difficulty urinating, dysuria, enuresis and pelvic pain.  Musculoskeletal: Negative.  Negative for arthralgias and myalgias.  Skin: Negative.   Neurological: Negative.  Negative for dizziness, seizures, weakness, light-headedness and numbness.  Hematological:  Negative for adenopathy. Does not bruise/bleed easily.  Psychiatric/Behavioral:  The patient is nervous/anxious.     Objective:  BP 124/70 (BP Location: Left Arm, Patient Position: Sitting, Cuff Size: Large)   Pulse 76   Temp 98.2 F (36.8 C) (Oral)   Ht 5\' 4"  (1.626 m)   Wt 192 lb (87.1 kg)   SpO2  96%   BMI 32.96 kg/m   BP Readings from Last 3 Encounters:  11/25/22 124/70  07/24/22 128/76  06/04/22 132/68    Wt Readings from Last 3 Encounters:  11/25/22 192 lb (87.1 kg)  07/24/22 185 lb 6.4 oz (84.1 kg)  06/04/22 187 lb (84.8 kg)    Physical Exam Vitals reviewed.  HENT:     Mouth/Throat:     Mouth: Mucous membranes are moist.  Eyes:     General: No scleral icterus.    Conjunctiva/sclera: Conjunctivae normal.  Cardiovascular:     Rate and Rhythm: Normal rate and regular rhythm.     Heart sounds: Murmur heard.     No friction rub. No gallop.  Pulmonary:     Effort: Pulmonary effort is normal.     Breath sounds: No  stridor. No wheezing, rhonchi or rales.  Abdominal:     General: Abdomen is protuberant. There is no distension.     Palpations: There is no hepatomegaly, splenomegaly or mass.     Tenderness: There is no abdominal tenderness. There is no guarding.     Hernia: No hernia is present.  Musculoskeletal:     Cervical back: Neck supple.     Right lower leg: 1+ Pitting Edema present.     Left lower leg: 1+ Pitting Edema present.  Lymphadenopathy:     Cervical: No cervical adenopathy.  Skin:    General: Skin is warm and dry.  Neurological:     General: No focal deficit present.     Mental Status: She is alert. Mental status is at baseline.  Psychiatric:        Mood and Affect: Mood normal.        Behavior: Behavior normal.     Lab Results  Component Value Date   WBC 5.3 11/25/2022   HGB 13.2 11/25/2022   HCT 39.3 11/25/2022   PLT 240.0 11/25/2022   GLUCOSE 97 11/25/2022   CHOL 139 06/04/2022   TRIG 145.0 06/04/2022   HDL 44.50 06/04/2022   LDLDIRECT 92.0 12/15/2016   LDLCALC 66 06/04/2022   ALT 18 11/25/2022   AST 16 11/25/2022   NA 138 11/25/2022   K 4.5 11/25/2022   CL 108 11/25/2022   CREATININE 0.94 11/25/2022   BUN 34 (H) 11/25/2022   CO2 25 11/25/2022   TSH 1.62 11/25/2022   HGBA1C 6.0 11/25/2022    MR Brain Wo Contrast  Result Date: 09/11/2021 CLINICAL DATA:  Provided history: Transient amnesia. Dizziness, nonspecific. EXAM: MRI HEAD WITHOUT CONTRAST TECHNIQUE: Multiplanar, multiecho pulse sequences of the brain and surrounding structures were obtained without intravenous contrast. COMPARISON:  Head CT 06/30/2021. FINDINGS: Brain: Cerebral volume appears normal for age. Mild multifocal T2 FLAIR hyperintense signal abnormality within the cerebral white matter, nonspecific but compatible with chronic small vessel ischemic disease. There is no acute infarct. No evidence of an intracranial mass. No chronic intracranial blood products. No extra-axial fluid collection. No  midline shift. Vascular: Maintained flow voids within the proximal large arterial vessels. Skull and upper cervical spine: No focal suspicious marrow lesion. Sinuses/Orbits: Visualized orbits show no acute finding. Bilateral lens replacements. Trace mucosal thickening within the bilateral ethmoid sinuses. IMPRESSION: No evidence of acute intracranial abnormality. Mild chronic small vessel ischemic changes within the cerebral white matter. Minimal mucosal thickening within the bilateral ethmoid sinuses. Electronically Signed   By: Kellie Simmering D.O.   On: 09/11/2021 07:48    Assessment & Plan:  Essential hypertension- Her blood pressure is overcontrolled  and she has lower extremity edema.  Will discontinue the CCB. -     Basic metabolic panel; Future -     TSH; Future -     Hepatic function panel; Future  Atherosclerosis of aorta ([place])- Risk factor modifications addressed.  Acute seborrheic dermatitis -     Ciclopirox Olamine; Apply topically 2 (two) times daily.  Dispense: 90 g; Refill: 2 -     Desonide; Apply topically 2 (two) times daily.  Dispense: 60 g; Refill: 2  Allergic contact dermatitis due to drugs in contact with skin- I have asked her to stop applying Neosporin ointment to her face. -     Desonide; Apply topically 2 (two) times daily.  Dispense: 60 g; Refill: 2  Iron deficiency anemia secondary to inadequate dietary iron intake -     CBC with Differential/Platelet; Future  Hyperlipidemia with target LDL less than 130 -     TSH; Future -     Hepatic function panel; Future  Prediabetes -     Basic metabolic panel; Future -     Hemoglobin A1c; Future  OAB (overactive bladder) -     Urinalysis, Routine w reflex microscopic; Future -     CULTURE, URINE COMPREHENSIVE; Future -     Solifenacin Succinate; Take 1 tablet (10 mg total) by mouth daily.  Dispense: 90 tablet; Refill: 1  Stage 3a chronic kidney disease ([place])- Will avoid nephrotoxic agents.     Follow-up: Return in  about 6 months (around 05/28/2023).  Scarlette Calico, MD

## 2022-11-25 NOTE — Patient Instructions (Signed)
Seborrheic Dermatitis, Adult Seborrheic dermatitis is a skin disease that causes red, scaly patches. It often occurs on the scalp, where it may be called dandruff. The patches may also appear on other parts of the body. Skin patches tend to occur where there are a lot of oil glands in the skin. Areas of the body that may be affected include: The scalp. The face, eyebrows, and ears. The area around a beard. Skin folds of the body. This includes the armpits, groin, and buttocks. The chest. The condition is often long-lasting (chronic). It may come and go for no known reason. It may be activated by a trigger, such as: Cold weather. Being out in the sun. Stress. Drinking alcohol. What are the causes? The cause of this condition is not known. It may be related to having too much yeast on the skin or changes in how your body's disease-fighting system (immune system) works. What increases the risk? You may be more likely to develop this condition if: You have a weak immune system. You are 50 years old or older. You have other conditions, such as: Human immunodeficiency virus (HIV) or acquired immunodeficiency virus (AIDS). Parkinson's disease. Mood disorders, such as depression. Liver problems. Obesity. What are the signs or symptoms? Symptoms of this condition include: Thick scales on the scalp. Redness on the face or in the armpits. Skin that is flaky. The flakes may be white or yellow. Skin that seems oily or dry but is not helped with moisturizers. Itching or burning in the affected areas. How is this diagnosed? This condition is diagnosed with a medical history and physical exam. A sample of your skin may be tested (skin biopsy). You may need to see a skin specialist (dermatologist). How is this treated? There is no cure for this condition, but treatment can help to manage the symptoms. You may get treatment to remove scales, lower the risk of skin infection, and reduce swelling or  itching. Treatment may include: Medicated shampoos, moisturizing creams, or ointments. Creams that reduce skin yeast. Creams that reduce swelling and irritation (steroids). Follow these instructions at home: Skin care Use any medicated shampoo, skin creams, or ointments only as told by your health care provider. Do not use skin products that contain alcohol. Take lukewarm baths or showers. Avoid very hot water. When you are outside, wear a hat and clothes that block UV light. General instructions Apply over-the-counter and prescription medicines only as told by your health care provider. Learn what triggers your symptoms so you can avoid these things. Use techniques for stress reduction, such as meditation or yoga. Do not drink alcohol if your health care provider tells you not to drink. Keep all follow-up visits. Your health care provider will check your skin to make sure the treatments are helping. Where to find more information American Academy of Dermatology: aad.org Contact a health care provider if: Your symptoms do not get better with treatment. Your symptoms get worse. You have new symptoms. Get help right away if: Your condition quickly gets worse, even with treatment. This information is not intended to replace advice given to you by your health care provider. Make sure you discuss any questions you have with your health care provider. Document Revised: 01/23/2022 Document Reviewed: 01/23/2022 Elsevier Patient Education  2023 [employer]  

## 2022-11-26 DIAGNOSIS — M7542 Impingement syndrome of left shoulder: Secondary | ICD-10-CM | POA: Diagnosis not present

## 2022-11-27 LAB — CULTURE, URINE COMPREHENSIVE

## 2022-12-01 DIAGNOSIS — M7542 Impingement syndrome of left shoulder: Secondary | ICD-10-CM | POA: Diagnosis not present

## 2022-12-02 ENCOUNTER — Ambulatory Visit: Payer: Medicare PPO | Admitting: Internal Medicine

## 2022-12-03 DIAGNOSIS — M7542 Impingement syndrome of left shoulder: Secondary | ICD-10-CM | POA: Diagnosis not present

## 2022-12-08 DIAGNOSIS — M7542 Impingement syndrome of left shoulder: Secondary | ICD-10-CM | POA: Diagnosis not present

## 2022-12-10 DIAGNOSIS — M7542 Impingement syndrome of left shoulder: Secondary | ICD-10-CM | POA: Diagnosis not present

## 2022-12-18 DIAGNOSIS — M7542 Impingement syndrome of left shoulder: Secondary | ICD-10-CM | POA: Diagnosis not present

## 2022-12-21 DIAGNOSIS — M7542 Impingement syndrome of left shoulder: Secondary | ICD-10-CM | POA: Diagnosis not present

## 2022-12-25 DIAGNOSIS — M7542 Impingement syndrome of left shoulder: Secondary | ICD-10-CM | POA: Diagnosis not present

## 2022-12-28 DIAGNOSIS — M7542 Impingement syndrome of left shoulder: Secondary | ICD-10-CM | POA: Diagnosis not present

## 2022-12-29 ENCOUNTER — Ambulatory Visit: Payer: Medicare PPO | Admitting: Physician Assistant

## 2022-12-30 ENCOUNTER — Other Ambulatory Visit: Payer: Self-pay | Admitting: Internal Medicine

## 2022-12-30 DIAGNOSIS — F5104 Psychophysiologic insomnia: Secondary | ICD-10-CM

## 2022-12-30 DIAGNOSIS — K2100 Gastro-esophageal reflux disease with esophagitis, without bleeding: Secondary | ICD-10-CM

## 2022-12-31 DIAGNOSIS — M7542 Impingement syndrome of left shoulder: Secondary | ICD-10-CM | POA: Diagnosis not present

## 2023-01-03 ENCOUNTER — Other Ambulatory Visit: Payer: Self-pay | Admitting: Internal Medicine

## 2023-01-03 DIAGNOSIS — I10 Essential (primary) hypertension: Secondary | ICD-10-CM

## 2023-01-04 DIAGNOSIS — N898 Other specified noninflammatory disorders of vagina: Secondary | ICD-10-CM | POA: Diagnosis not present

## 2023-01-04 DIAGNOSIS — L900 Lichen sclerosus et atrophicus: Secondary | ICD-10-CM | POA: Diagnosis not present

## 2023-01-04 DIAGNOSIS — M8580 Other specified disorders of bone density and structure, unspecified site: Secondary | ICD-10-CM | POA: Diagnosis not present

## 2023-01-04 DIAGNOSIS — Z6834 Body mass index (BMI) 34.0-34.9, adult: Secondary | ICD-10-CM | POA: Diagnosis not present

## 2023-01-04 DIAGNOSIS — Z01419 Encounter for gynecological examination (general) (routine) without abnormal findings: Secondary | ICD-10-CM | POA: Diagnosis not present

## 2023-01-11 ENCOUNTER — Ambulatory Visit (HOSPITAL_BASED_OUTPATIENT_CLINIC_OR_DEPARTMENT_OTHER): Payer: Medicare PPO | Admitting: Pulmonary Disease

## 2023-01-11 ENCOUNTER — Encounter (HOSPITAL_BASED_OUTPATIENT_CLINIC_OR_DEPARTMENT_OTHER): Payer: Self-pay | Admitting: Pulmonary Disease

## 2023-01-11 VITALS — BP 154/70 | HR 70 | Temp 97.8°F | Ht 64.0 in | Wt 197.2 lb

## 2023-01-11 DIAGNOSIS — G4733 Obstructive sleep apnea (adult) (pediatric): Secondary | ICD-10-CM

## 2023-01-11 NOTE — Assessment & Plan Note (Signed)
CPAP download was reviewed and shows excellent control of events on auto settings 5 to 15 cm with average pressure of 10.6 and max of pressure 12.3 cm.  This average pressure is higher than before likely due to weight gain.  She is very compliant.  CPAP is only helped improve her daytime somnolence and fatigue.  She was unable to tolerate dental appliance in the past and is comfortable with using CPAP. CPAP supplies will be renewed for a year Weight loss encouraged, compliance with goal of at least 4-6 hrs every night is the expectation. Advised against medications with sedative side effects Cautioned against driving when sleepy - understanding that sleepiness will vary on a day to day basis

## 2023-01-11 NOTE — Patient Instructions (Signed)
CPAP is working well on current settings Weight loss advised

## 2023-01-11 NOTE — Progress Notes (Signed)
   Subjective:    Patient ID: Kayla Khan, female    DOB: [birth year], 72 y.o.   MRN: [id number]  HPI  72 yo female followed for mild OSA on CPAP & RLS PMH : spinal stenosis with neuropathy She was diagnosed in 2004 and maintained on CPAP since   I am seeing her after 5 years, last office visit with APP 12/2021.  She is maintained on auto settings 5 to 15 cm.  She tried dental appliance but could not tolerate this.  Her current CPAP is 74 years old. She has gained 27 pounds over the last 5 years. She admits to having sinus pneumonia and depression, her son was diagnosed with colon cancer and went through chemotherapy and radiation No problems with mask or pressure  Significant tests/ events reviewed   NPSG 2004:  AHI 10/hr, cpap optimized to 5cm.  Review of Systems neg for any significant sore throat, dysphagia, itching, sneezing, nasal congestion or excess/ purulent secretions, fever, chills, sweats, unintended wt loss, pleuritic or exertional cp, hempoptysis, orthopnea pnd or change in chronic leg swelling. Also denies presyncope, palpitations, heartburn, abdominal pain, nausea, vomiting, diarrhea or change in bowel or urinary habits, dysuria,hematuria, rash, arthralgias, visual complaints, headache, numbness weakness or ataxia.     Objective:   Physical Exam  Gen. Pleasant, obese, in no distress ENT - no lesions, no post nasal drip Neck: No JVD, no thyromegaly, no carotid bruits Lungs: no use of accessory muscles, no dullness to percussion, decreased without rales or rhonchi  Cardiovascular: Rhythm regular, heart sounds  normal, no murmurs or gallops, no peripheral edema Musculoskeletal: No deformities, no cyanosis or clubbing , no tremors        Assessment & Plan:

## 2023-02-16 DIAGNOSIS — L813 Cafe au lait spots: Secondary | ICD-10-CM | POA: Diagnosis not present

## 2023-02-16 DIAGNOSIS — L570 Actinic keratosis: Secondary | ICD-10-CM | POA: Diagnosis not present

## 2023-02-16 DIAGNOSIS — D1801 Hemangioma of skin and subcutaneous tissue: Secondary | ICD-10-CM | POA: Diagnosis not present

## 2023-02-16 DIAGNOSIS — L820 Inflamed seborrheic keratosis: Secondary | ICD-10-CM | POA: Diagnosis not present

## 2023-02-16 DIAGNOSIS — I872 Venous insufficiency (chronic) (peripheral): Secondary | ICD-10-CM | POA: Diagnosis not present

## 2023-02-16 DIAGNOSIS — D485 Neoplasm of uncertain behavior of skin: Secondary | ICD-10-CM | POA: Diagnosis not present

## 2023-02-16 DIAGNOSIS — L738 Other specified follicular disorders: Secondary | ICD-10-CM | POA: Diagnosis not present

## 2023-02-16 DIAGNOSIS — G4733 Obstructive sleep apnea (adult) (pediatric): Secondary | ICD-10-CM | POA: Diagnosis not present

## 2023-02-16 DIAGNOSIS — L821 Other seborrheic keratosis: Secondary | ICD-10-CM | POA: Diagnosis not present

## 2023-02-16 DIAGNOSIS — L814 Other melanin hyperpigmentation: Secondary | ICD-10-CM | POA: Diagnosis not present

## 2023-03-22 DIAGNOSIS — Z1231 Encounter for screening mammogram for malignant neoplasm of breast: Secondary | ICD-10-CM | POA: Diagnosis not present

## 2023-03-22 LAB — HM MAMMOGRAPHY

## 2023-03-23 ENCOUNTER — Encounter: Payer: Self-pay | Admitting: Family Medicine

## 2023-03-24 ENCOUNTER — Encounter: Payer: Self-pay | Admitting: Internal Medicine

## 2023-03-24 ENCOUNTER — Ambulatory Visit: Payer: Medicare PPO | Admitting: Internal Medicine

## 2023-03-24 VITALS — BP 138/76 | HR 83 | Temp 98.1°F | Resp 16 | Ht 64.0 in | Wt 192.0 lb

## 2023-03-24 DIAGNOSIS — Z23 Encounter for immunization: Secondary | ICD-10-CM | POA: Diagnosis not present

## 2023-03-24 DIAGNOSIS — Z0001 Encounter for general adult medical examination with abnormal findings: Secondary | ICD-10-CM | POA: Diagnosis not present

## 2023-03-24 DIAGNOSIS — Z1211 Encounter for screening for malignant neoplasm of colon: Secondary | ICD-10-CM

## 2023-03-24 DIAGNOSIS — T782XXS Anaphylactic shock, unspecified, sequela: Secondary | ICD-10-CM

## 2023-03-24 DIAGNOSIS — T782XXA Anaphylactic shock, unspecified, initial encounter: Secondary | ICD-10-CM | POA: Insufficient documentation

## 2023-03-24 DIAGNOSIS — R7303 Prediabetes: Secondary | ICD-10-CM | POA: Diagnosis not present

## 2023-03-24 DIAGNOSIS — I700 Atherosclerosis of aorta: Secondary | ICD-10-CM | POA: Diagnosis not present

## 2023-03-24 DIAGNOSIS — I10 Essential (primary) hypertension: Secondary | ICD-10-CM

## 2023-03-24 DIAGNOSIS — E785 Hyperlipidemia, unspecified: Secondary | ICD-10-CM | POA: Diagnosis not present

## 2023-03-24 DIAGNOSIS — N1831 Chronic kidney disease, stage 3a: Secondary | ICD-10-CM

## 2023-03-24 LAB — URINALYSIS, ROUTINE W REFLEX MICROSCOPIC
Bilirubin Urine: NEGATIVE
Hgb urine dipstick: NEGATIVE
Ketones, ur: NEGATIVE
Leukocytes,Ua: NEGATIVE
Nitrite: NEGATIVE
RBC / HPF: NONE SEEN (ref 0–?)
Specific Gravity, Urine: 1.01 (ref 1.000–1.030)
Total Protein, Urine: NEGATIVE
Urine Glucose: NEGATIVE
Urobilinogen, UA: 0.2 (ref 0.0–1.0)
pH: 6 (ref 5.0–8.0)

## 2023-03-24 LAB — BASIC METABOLIC PANEL
BUN: 27 mg/dL — ABNORMAL HIGH (ref 6–23)
CO2: 26 mEq/L (ref 19–32)
Calcium: 10.1 mg/dL (ref 8.4–10.5)
Chloride: 108 mEq/L (ref 96–112)
Creatinine, Ser: 0.9 mg/dL (ref 0.40–1.20)
GFR: 62.99 mL/min (ref >60.00)
Glucose, Bld: 110 mg/dL — ABNORMAL HIGH (ref 70–99)
Potassium: 4.5 mEq/L (ref 3.5–5.1)
Sodium: 141 mEq/L (ref 135–145)

## 2023-03-24 LAB — HEMOGLOBIN A1C: Hgb A1c MFr Bld: 5.9 % (ref 4.6–6.5)

## 2023-03-24 LAB — MICROALBUMIN / CREATININE URINE RATIO
Creatinine,U: 30.8 mg/dL
Microalb Creat Ratio: 2.3 mg/g (ref 0.0–30.0)
Microalb, Ur: <0.7 mg/dL (ref 0.0–1.9)

## 2023-03-24 NOTE — Patient Instructions (Signed)

## 2023-03-24 NOTE — Progress Notes (Unsigned)
Subjective:  Patient ID: Kayla Khan, female    DOB: [birth year]  Age: 72 y.o. MRN: [id number]  CC: Annual Exam, Hypertension, and Hyperlipidemia   HPI Kayla Khan presents for a CPX and f/up -----  Discussed the use of AI scribe software for clinical note transcription with the patient, who gave verbal consent to proceed.  History of Present Illness   The patient, with a history of kidney disease, osteopenia, and a recent family history of colon cancer, presents with concerns about their kidney function and a recurring drug allergy. They report that their kidney function has been slowly declining over the past decade, with recent labs indicating a GFR of 59.9. They deny any destructive habits such as alcohol consumption or regular use of anti-inflammatories, but do admit to occasional use of baby aspirin for pain.  The patient also reports a recurring allergic reaction to various medications, including generic Paxil, generic Crestor, and most recently, SAMe, an over-the-counter supplement. The reaction is characterized by shortness of breath, nausea, and a general feeling of unwellness. They express concern about potential future allergic reactions to medications in a hospital setting.  The patient is currently on medication for bladder control, which they report as being effective but causing them to carry water with them at all times due to the medication's dehydrating effect. 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 They deny any associated cold, clammy sweats or irregular heartbeats.       Outpatient Medications Prior to Visit  Medication Sig Dispense Refill   ALPRAZolam (XANAX) 0.25 MG tablet Take 1 tablet (0.25 mg total) by mouth 2 (two) times daily as needed. 35 tablet 2   Arginine 1000 MG TABS Take 3,000 mg by mouth in the morning and at bedtime.      Ascorbic Acid (VITAMIN C) 1000 MG tablet Take 1,000 mg by mouth daily.     aspirin EC 81 MG tablet Take 81 mg by mouth daily.     Cholecalciferol (VITAMIN D) 50 MCG (2000 UT) tablet Take 2,000 Units by mouth daily.     ciclopirox (LOPROX) 0.77 % cream Apply topically 2 (two) times daily. 90 g 2   clobetasol ointment (TEMOVATE) 0.05 % Apply 1 application topically 2 (two) times daily. 30 g 2   co-enzyme Q-10 30 MG capsule Take 100 mg by mouth daily.     conjugated estrogens (PREMARIN) vaginal cream Place 1 Applicatorful vaginally 2 (two) times a week.      desonide (DESOWEN) 0.05 % cream Apply topically 2 (two) times daily. 60 g 2   famotidine (PEPCID) 40 MG tablet TAKE 1 TABLET(40 MG) BY MOUTH DAILY 90 tablet 1   fish oil-omega-3 fatty acids 1000 MG capsule Take 3 g by mouth daily.     meclizine (ANTIVERT) 25 MG tablet Take 25 mg by mouth 3 (three) times daily as needed for dizziness.     pravastatin (PRAVACHOL) 40 MG tablet Take 1 tablet (40 mg total) by mouth daily. 90 tablet 1   solifenacin (VESICARE) 10 MG tablet Take 1 tablet (10 mg total) by mouth daily. 90 tablet 1   Suvorexant (BELSOMRA) 15 MG TABS TAKE 1 TABLET BY MOUTH AT BEDTIME AS NEEDED 90 tablet 0   telmisartan (MICARDIS) 80 MG tablet TAKE 1 TABLET(80 MG) BY MOUTH DAILY 90 tablet 0   No facility-administered medications prior to visit.  ROS Review of Systems  Constitutional: Negative.  Negative for chills, diaphoresis, fatigue and fever.  HENT: Negative.    Eyes: Negative.   Respiratory: Negative.  Negative for cough, chest tightness, shortness of breath and wheezing.   Cardiovascular:  Negative for chest pain, palpitations and leg swelling.  Gastrointestinal:  Negative for abdominal pain, constipation, diarrhea, nausea and vomiting.  Genitourinary:  Positive for frequency. Negative for dysuria and hematuria.  Musculoskeletal: Negative.  Negative for myalgias.  Skin: Negative.   Neurological:  Negative for dizziness,  weakness and light-headedness.  Hematological:  Negative for adenopathy.  Psychiatric/Behavioral: Negative.      Objective:  BP 138/76 (BP Location: Left Arm, Patient Position: Sitting, Cuff Size: Large)   Pulse 83   Temp 98.1 F (36.7 C) (Oral)   Resp 16   Ht 5\' 4"  (1.626 m)   Wt 192 lb (87.1 kg)   SpO2 94%   BMI 32.96 kg/m   BP Readings from Last 3 Encounters:  03/24/23 138/76  01/11/23 (!) 154/70  11/25/22 124/70    Wt Readings from Last 3 Encounters:  03/24/23 192 lb (87.1 kg)  01/11/23 197 lb 3.2 oz (89.4 kg)  11/25/22 192 lb (87.1 kg)    Physical Exam Vitals reviewed.  Constitutional:      Appearance: Normal appearance.  HENT:     Nose: Nose normal.     Mouth/Throat:     Mouth: Mucous membranes are moist.  Eyes:     General: No scleral icterus.    Conjunctiva/sclera: Conjunctivae normal.  Cardiovascular:     Rate and Rhythm: Normal rate and regular rhythm.     Heart sounds: No murmur heard. Pulmonary:     Effort: Pulmonary effort is normal.     Breath sounds: No stridor. No wheezing, rhonchi or rales.  Abdominal:     General: Abdomen is protuberant. Bowel sounds are normal. There is no distension.     Palpations: There is no mass.     Tenderness: There is no abdominal tenderness. There is no guarding.     Hernia: No hernia is present.  Musculoskeletal:        General: Normal range of motion.     Cervical back: Neck supple.     Right lower leg: No edema.     Left lower leg: No edema.  Lymphadenopathy:     Cervical: No cervical adenopathy.  Skin:    General: Skin is warm and dry.  Neurological:     General: No focal deficit present.     Mental Status: She is alert. Mental status is at baseline.  Psychiatric:        Mood and Affect: Mood normal.        Behavior: Behavior normal.     Lab Results  Component Value Date   WBC 5.3 11/25/2022   HGB 13.2 11/25/2022   HCT 39.3 11/25/2022   PLT 240.0 11/25/2022   GLUCOSE 110 (H) 03/24/2023   CHOL  139 06/04/2022   TRIG 145.0 06/04/2022   HDL 44.50 06/04/2022   LDLDIRECT 92.0 12/15/2016   LDLCALC 66 06/04/2022   ALT 18 11/25/2022   AST 16 11/25/2022   NA 141 03/24/2023   K 4.5 03/24/2023   CL 108 03/24/2023   CREATININE 0.90 03/24/2023   BUN 27 (H) 03/24/2023   CO2 26 03/24/2023   TSH 1.62 11/25/2022   HGBA1C 5.9 03/24/2023   MICROALBUR <0.7 03/24/2023    MR Brain Wo Contrast  Result Date: 09/11/2021 CLINICAL  DATA:  Provided history: Transient amnesia. Dizziness, nonspecific. EXAM: MRI HEAD WITHOUT CONTRAST TECHNIQUE: Multiplanar, multiecho pulse sequences of the brain and surrounding structures were obtained without intravenous contrast. COMPARISON:  Head CT 06/30/2021. FINDINGS: Brain: Cerebral volume appears normal for age. Mild multifocal T2 FLAIR hyperintense signal abnormality within the cerebral white matter, nonspecific but compatible with chronic small vessel ischemic disease. There is no acute infarct. No evidence of an intracranial mass. No chronic intracranial blood products. No extra-axial fluid collection. No midline shift. Vascular: Maintained flow voids within the proximal large arterial vessels. Skull and upper cervical spine: No focal suspicious marrow lesion. Sinuses/Orbits: Visualized orbits show no acute finding. Bilateral lens replacements. Trace mucosal thickening within the bilateral ethmoid sinuses. IMPRESSION: No evidence of acute intracranial abnormality. Mild chronic small vessel ischemic changes within the cerebral white matter. Minimal mucosal thickening within the bilateral ethmoid sinuses. Electronically Signed   By: Jackey Loge D.O.   On: 09/11/2021 07:48    Assessment & Plan:   Atherosclerosis of aorta (HCC)- Risk factor modifications addressed. -     Lipoprotein A (LPA); Future  Essential hypertension - Her BP is well controlled. -     Urinalysis, Routine w reflex microscopic; Future -     Microalbumin / creatinine urine ratio;  Future  Hyperlipidemia with target LDL less than 130 - LDL goal achieved. Doing well on the statin  -     Lipoprotein A (LPA); Future  Prediabetes -     Basic metabolic panel; Future -     Hemoglobin A1c; Future -     Microalbumin / creatinine urine ratio; Future  Stage 3a chronic kidney disease (HCC)- Renal function is stable -     Basic metabolic panel; Future -     Urinalysis, Routine w reflex microscopic; Future -     Microalbumin / creatinine urine ratio; Future  Encounter for general adult medical examination with abnormal findings- Exam completed, labs reviewed, vaccines reviewed and updated, cancer screenings addressed, pt ed material was given.  Screening for colon cancer -     Cologuard  Anaphylaxis, sequela -     Ambulatory referral to Allergy  Other orders -     Pneumococcal polysaccharide vaccine 23-valent greater than or equal to 2yo subcutaneous/IM     Follow-up: Return in about 6 months (around 09/24/2023).  Sanda Linger, MD

## 2023-03-28 LAB — LIPOPROTEIN A (LPA): Lipoprotein (a): 12 nmol/L (ref <75)

## 2023-03-30 DIAGNOSIS — Z961 Presence of intraocular lens: Secondary | ICD-10-CM | POA: Diagnosis not present

## 2023-03-30 DIAGNOSIS — H524 Presbyopia: Secondary | ICD-10-CM | POA: Diagnosis not present

## 2023-03-30 DIAGNOSIS — H04123 Dry eye syndrome of bilateral lacrimal glands: Secondary | ICD-10-CM | POA: Diagnosis not present

## 2023-04-02 ENCOUNTER — Other Ambulatory Visit: Payer: Self-pay | Admitting: Internal Medicine

## 2023-04-02 DIAGNOSIS — F411 Generalized anxiety disorder: Secondary | ICD-10-CM

## 2023-04-02 DIAGNOSIS — I10 Essential (primary) hypertension: Secondary | ICD-10-CM

## 2023-04-05 DIAGNOSIS — Z1211 Encounter for screening for malignant neoplasm of colon: Secondary | ICD-10-CM | POA: Diagnosis not present

## 2023-04-08 DIAGNOSIS — G4733 Obstructive sleep apnea (adult) (pediatric): Secondary | ICD-10-CM | POA: Diagnosis not present

## 2023-04-08 DIAGNOSIS — E669 Obesity, unspecified: Secondary | ICD-10-CM | POA: Diagnosis not present

## 2023-04-08 DIAGNOSIS — E785 Hyperlipidemia, unspecified: Secondary | ICD-10-CM | POA: Diagnosis not present

## 2023-04-08 DIAGNOSIS — K219 Gastro-esophageal reflux disease without esophagitis: Secondary | ICD-10-CM | POA: Diagnosis not present

## 2023-04-08 DIAGNOSIS — I2510 Atherosclerotic heart disease of native coronary artery without angina pectoris: Secondary | ICD-10-CM | POA: Diagnosis not present

## 2023-04-08 DIAGNOSIS — M1990 Unspecified osteoarthritis, unspecified site: Secondary | ICD-10-CM | POA: Diagnosis not present

## 2023-04-08 DIAGNOSIS — I10 Essential (primary) hypertension: Secondary | ICD-10-CM | POA: Diagnosis not present

## 2023-04-08 DIAGNOSIS — F411 Generalized anxiety disorder: Secondary | ICD-10-CM | POA: Diagnosis not present

## 2023-04-08 DIAGNOSIS — N3281 Overactive bladder: Secondary | ICD-10-CM | POA: Diagnosis not present

## 2023-05-04 ENCOUNTER — Encounter: Payer: Self-pay | Admitting: Internal Medicine

## 2023-05-04 ENCOUNTER — Other Ambulatory Visit: Payer: Self-pay

## 2023-05-04 ENCOUNTER — Ambulatory Visit: Payer: Medicare PPO | Admitting: Internal Medicine

## 2023-05-04 VITALS — BP 120/60 | HR 95 | Temp 99.1°F | Resp 16 | Ht 64.0 in | Wt 191.5 lb

## 2023-05-04 DIAGNOSIS — Z888 Allergy status to other drugs, medicaments and biological substances status: Secondary | ICD-10-CM | POA: Diagnosis not present

## 2023-05-04 DIAGNOSIS — J310 Chronic rhinitis: Secondary | ICD-10-CM | POA: Diagnosis not present

## 2023-05-04 DIAGNOSIS — T50905D Adverse effect of unspecified drugs, medicaments and biological substances, subsequent encounter: Secondary | ICD-10-CM

## 2023-05-04 MED ORDER — FLUTICASONE PROPIONATE 50 MCG/ACT NA SUSP: 2.0000 | Freq: Every day | NASAL | 5 refills | Status: DC

## 2023-05-04 MED ORDER — FEXOFENADINE HCL 180 MG PO TABS: 180.0000 mg | ORAL_TABLET | Freq: Every day | ORAL | 5 refills | Status: DC | PRN

## 2023-05-04 MED ORDER — AZELASTINE HCL 0.1 % NA SOLN: 1.0000 | Freq: Two times a day (BID) | NASAL | 5 refills | Status: DC | PRN

## 2023-05-04 NOTE — Patient Instructions (Signed)
Chronic Rhinitis: - Use nasal saline rinses before nose sprays such as with Neilmed Sinus Rinse.  Use distilled water.   - Use Flonase 1-2 sprays each nostril daily. Aim upward and outward. - Use Azelastine 1-2 sprays each nostril twice daily as needed for runny nose, drainage, sneezing, congestion. Aim upward and outward. - Use Allegra 180mg  daily as needed for runny nose, sneezing, itchy watery eyes.  - If symptoms worsen, consider allergy testing.    Reactions to Medications - There is unfortunately no way to predict drug reactions/intolerances.  There is no testing available for this.   - If starting new medications in future, recommend starting 1 at a time to avoid confusion on which one can be contributing to reactions.

## 2023-05-04 NOTE — Progress Notes (Signed)
NEW PATIENT  Date of Service/Encounter:  05/04/23  Consult requested by: Etta Grandchild, MD   Subjective:   Kayla Khan (DOB: [birth year]) is a 72 y.o. female who presents to the clinic on 05/04/2023 with a chief complaint of Establish Care .    History obtained from: chart review and patient.   Drug Intolerances: Reports a few years ago, she started having reactions to medications. Took Crestor and felt poorly and sick afterwards; did fine with pravastatin. Similar issue with paxil which she had taken in the past. More recently, similar symptoms with indapamide and SAMe over the counter.  Never had wheezing, angioedema, hives.  She wonders if this is something in the medications causing this as they were all generic.   05/15/2020: seen by Dr Yetta Barre for reaction to crestor. "She started taking a statin about 2 months ago and now for 3 weeks she complains of mild, intermittent headache with diffuse weakness, fatigue, feeling like her heart is beating hard, and insomnia with frequent awakenings. She denies nausea, vomiting, paresthesias, dizziness, lightheadedness, near syncope, myalgias, or arthralgias. She recently stopped taking Crestor and is started feeling better."  04/28/2021 seen by Dr Okey Dupre for reaction to Paxil "The patient is a 72 YO female coming in for concerns with starting paxil. Started that about a month ago and since is having palpitations and high BP and racing heart. Some SOB on exertion which is new also. Taking 12.5 mg daily for 2-3 weeks. Having nausea and lost several pounds in the last week. She is getting dizziness in the last week. She denies chest pains. Some SOB at rest and exertion. Anxiety is improved."  Rhinitis:  Started many years ago.  Symptoms include: nasal congestion, rhinorrhea, and post nasal drainage  Occurs seasonally-Spring/Summer  Potential triggers: trees, dust   Treatments tried:  Flonase Allegra Previous allergy testing: yes; twice  in the past and was negative.  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 h/o such - cor CT 03/2020 with minimal CAD, consider noncardiac causes of pain.   Palpitations    Pre-diabetes    Sleep apnea    Thyroid nodule    The patient had a thyroid nodule that was surgically removed in the past.  She has one half of her thyroid and does not take replacement of Taxol and he was benign    Past Surgical History: Past Surgical History:  Procedure Laterality Date   APPENDECTOMY     BREAST BIOPSY     CESAREAN SECTION     x2   GANGLION CYST EXCISION     THYROIDECTOMY, PARTIAL     left   TUBAL LIGATION     VESICOVAGINAL FISTULA CLOSURE W/ TAH      Family History: Family History  Problem Relation Age  of Onset   Cancer Mother    Hypertension Father    Aneurysm Father 50       cerebral hemorrhage   Cancer Brother    Stroke Maternal Grandmother    Heart attack Maternal Grandfather    Stroke Paternal Grandfather    Diabetes Other    Heart disease Other    Cancer Other    Arthritis Other    Hypertension Other    Aneurysm Other    Colon cancer Son     Social History:  Flooring in bedroom: carpet Pets: cat Tobacco use/exposure: none Job: retired Runner, broadcasting/film/video   Medication List:  Allergies as of 05/04/2023       Reactions   Amoxicillin-pot Clavulanate    Beta Adrenergic Blockers     Makes chest pain worse   Crestor [rosuvastatin]    Indapamide    Latex    Meperidine Hcl    Paxil [paroxetine]    Amlodipine    Edema   Hctz [hydrochlorothiazide] Nausea And Vomiting        Medication List        Accurate as of May 04, 2023  2:11 PM. If you have any questions, ask your nurse or doctor.          ALPRAZolam 0.25 MG tablet Commonly known as: XANAX TAKE 1 TABLET(0.25 MG) BY MOUTH TWICE DAILY AS NEEDED   Arginine 1000 MG Tabs Take 3,000 mg by mouth in the morning and at bedtime.   aspirin EC 81 MG tablet Take 81 mg by mouth daily.   Belsomra 15 MG Tabs Generic drug: Suvorexant TAKE 1 TABLET BY MOUTH AT BEDTIME AS NEEDED   ciclopirox 0.77 % cream Commonly known as: Loprox Apply topically 2 (two) times daily.   clobetasol ointment 0.05 % Commonly known as: TEMOVATE Apply 1 application topically 2 (two) times daily.   co-enzyme Q-10 30 MG capsule Take 100 mg by mouth daily.   conjugated estrogens 0.625 MG/GM vaginal cream Commonly known as: PREMARIN Place 1 Applicatorful vaginally 2 (two) times a week.   desonide 0.05 % cream Commonly known as: DesOwen Apply topically 2 (two) times daily.   famotidine 40 MG tablet Commonly known as: PEPCID TAKE 1 TABLET(40 MG) BY MOUTH DAILY   fish oil-omega-3 fatty acids 1000 MG capsule Take 3 g by mouth daily.   meclizine 25 MG tablet Commonly known as: ANTIVERT Take 25 mg by mouth 3 (three) times daily as needed for dizziness.   pravastatin 40 MG tablet Commonly known as: PRAVACHOL Take 1 tablet (40 mg total) by mouth daily.   solifenacin 10 MG tablet Commonly known as: VESICARE Take 1 tablet (10 mg total) by mouth daily.   telmisartan 80 MG tablet Commonly known as: MICARDIS TAKE 1 TABLET(80 MG) BY MOUTH DAILY   vitamin C 1000 MG tablet Take 1,000 mg by mouth daily.   Vitamin D 50 MCG (2000 UT) tablet Take 2,000 Units by mouth daily.         REVIEW OF SYSTEMS: Pertinent  positives and negatives discussed in HPI.   Objective:   Physical Exam: BP 120/60   Pulse 95   Temp 99.1 F (37.3 C) (Temporal)   Resp 16   Ht 5\' 4"  (1.626 m)   Wt 191 lb 8 oz (86.9 kg)   SpO2 93%   BMI 32.87 kg/m  Body mass index is 32.87 kg/m. GEN: alert, well developed HEENT: clear conjunctiva,  nose with + inferior turbinate hypertrophy, pink nasal mucosa,  slight clear rhinorrhea, no cobblestoning HEART: regular rate and rhythm, no murmur LUNGS: clear to auscultation bilaterally, no coughing, unlabored respiration ABDOMEN: soft, non distended  SKIN: no rashes or lesions  Reviewed:  03/24/2023: seen by Dr Yetta Barre for allergic reactions to medications with SOB, nausea, feeling unwell.  Referred to Allergy for further evaluation.   01/11/2023: seen by Pulm Dr Vassie Loll for OSA on CPAP.  Has had weight gain resulting in higher pressures.   08/20/2022: seen by urgent care for cough, acute bronchitis.  Started on levaquin and promethazine-DM.   11/25/2022: seen by CP for rash on face.  Discussed likely contact dermatitis to Neosporin, stop applying that and use PRN desonide. Also with HLD, IDA, Pre DM, hx of CKD, HTN.   05/15/2020: seen by Dr Yetta Barre for reaction to crestor. "She started taking a statin about 2 months ago and now for 3 weeks she complains of mild, intermittent headache with diffuse weakness, fatigue, feeling like her heart is beating hard, and insomnia with frequent awakenings. She denies nausea, vomiting, paresthesias, dizziness, lightheadedness, near syncope, myalgias, or arthralgias. She recently stopped taking Crestor and is started feeling better."  04/28/2021 seen by Dr Okey Dupre for reaction to Paxil "The patient is a 72 YO female coming in for concerns with starting paxil. Started that about a month ago and since is having palpitations and high BP and racing heart. Some SOB on exertion which is new also. Taking 12.5 mg daily for 2-3 weeks. Having nausea and lost several  pounds in the last week. She is getting dizziness in the last week. She denies chest pains. Some SOB at rest and exertion. Anxiety is improved."   Assessment:   1. Chronic rhinitis   2. Adverse effect of drug, subsequent encounter     Plan/Recommendations:  Chronic Rhinitis: - Use nasal saline rinses before nose sprays such as with Neilmed Sinus Rinse.  Use distilled water.   - Use Flonase 1-2 sprays each nostril daily. Aim upward and outward. - Use Azelastine 1-2 sprays each nostril twice daily as needed for runny nose, drainage, sneezing, congestion. Aim upward and outward. - Use Allegra 180mg  daily as needed for runny nose, sneezing, itchy watery eyes.  - If symptoms worsen, consider allergy testing.    Reactions to Medications - Discussed these are not drug allergies but rather intolerance or even side effects/adverse reactions. There is unfortunately no way to predict drug reactions/intolerances.  There is no testing available for this.   - If starting new medications in future, recommend starting 1 at a time to avoid confusion on which one can be contributing to reactions.     Return if symptoms worsen or fail to improve.  Alesia Morin, MD [facility]

## 2023-05-13 DIAGNOSIS — G4733 Obstructive sleep apnea (adult) (pediatric): Secondary | ICD-10-CM | POA: Diagnosis not present

## 2023-05-18 ENCOUNTER — Other Ambulatory Visit (HOSPITAL_BASED_OUTPATIENT_CLINIC_OR_DEPARTMENT_OTHER): Payer: Self-pay

## 2023-05-18 MED ORDER — COMIRNATY 30 MCG/0.3ML IM SUSY
PREFILLED_SYRINGE | INTRAMUSCULAR | 0 refills | Status: DC
  Filled 2023-05-18: qty 0.3, 1d supply, fill #0

## 2023-05-18 MED ORDER — FLUAD 0.5 ML IM SUSY
PREFILLED_SYRINGE | INTRAMUSCULAR | 0 refills | Status: DC
  Filled 2023-05-18: qty 0.5, 1d supply, fill #0

## 2023-06-07 ENCOUNTER — Ambulatory Visit: Payer: Medicare PPO | Admitting: Family Medicine

## 2023-06-07 ENCOUNTER — Encounter: Payer: Self-pay | Admitting: Family Medicine

## 2023-06-07 VITALS — BP 138/80 | HR 82 | Temp 97.6°F | Resp 20 | Ht 64.0 in | Wt 187.0 lb

## 2023-06-07 DIAGNOSIS — R197 Diarrhea, unspecified: Secondary | ICD-10-CM

## 2023-06-07 DIAGNOSIS — E785 Hyperlipidemia, unspecified: Secondary | ICD-10-CM | POA: Diagnosis not present

## 2023-06-07 DIAGNOSIS — R112 Nausea with vomiting, unspecified: Secondary | ICD-10-CM | POA: Diagnosis not present

## 2023-06-07 DIAGNOSIS — N3281 Overactive bladder: Secondary | ICD-10-CM

## 2023-06-07 NOTE — Progress Notes (Signed)
Assessment & Plan:  1. Nausea, vomiting, and diarrhea Education provided on a gallbladder eating plan.  Encouraged adequate hydration. - US ABDOMEN LIMITED RUQ (LIVER/GB); Future  2. OAB (overactive bladder) Encouraged patient to discuss with her PCP as negative side effects are considered failure of a medication.  Suggested Myrbetriq after she has failed two other medications to treat.  3. Hyperlipidemia with target LDL less than 130 Advised patient if she stops her statin it can go 1 of 2 ways. 1- pain gets better in which case she needs to let her PCP or cardiologist know so that they can try a different medication that she may tolerate better. 2 -pain does not improve in which case she needs to resume the statin.   Follow up plan: Return if symptoms worsen or fail to improve.  Deliah Boston, MSN, APRN, FNP-C  Subjective:  HPI: Kayla Khan is a 72 y.o. female presenting on 06/07/2023 for gi issues (Yesterday had uncontrollable vomiting and diarrhea. Better today - still very weak. Phoebe Sharps - this has happened 3 separate days now  (once 3 weeks ago, once a week ago) )  Patient reports yesterday she experienced uncontrollable nausea, vomiting, and diarrhea.  She feels better today but is still very weak.  She states this happened once last week and once 3 weeks ago.  States she has not had anything caught in her diet and has not ate out.  Her only new medication is Vesicare which she has been taking for several months.  In relation to her Vesicare, she reports it dries her out and makes her feel dehydrated.  She also is questioning if her statin is causing joint pains.    ROS: Negative unless specifically indicated above in HPI.   Relevant past medical history reviewed and updated as indicated.   Allergies and medications reviewed and updated.   Current Outpatient Medications:    ALPRAZolam (XANAX) 0.25 MG tablet, TAKE 1 TABLET(0.25 MG) BY MOUTH TWICE DAILY AS NEEDED, Disp:  35 tablet, Rfl: 3   Arginine 1000 MG TABS, Take 3,000 mg by mouth in the morning and at bedtime., Disp: , Rfl:    Ascorbic Acid (VITAMIN C) 1000 MG tablet, Take 1,000 mg by mouth daily., Disp: , Rfl:    aspirin EC 81 MG tablet, Take 81 mg by mouth daily., Disp: , Rfl:    azelastine (ASTELIN) 0.1 % nasal spray, Place 1 spray into both nostrils 2 (two) times daily as needed. Use in each nostril as directed, Disp: 30 mL, Rfl: 5   Cholecalciferol (VITAMIN D) 50 MCG (2000 UT) tablet, Take 2,000 Units by mouth daily., Disp: , Rfl:    ciclopirox (LOPROX) 0.77 % cream, Apply topically 2 (two) times daily., Disp: 90 g, Rfl: 2   clobetasol ointment (TEMOVATE) 0.05 %, Apply 1 application topically 2 (two) times daily., Disp: 30 g, Rfl: 2   co-enzyme Q-10 30 MG capsule, Take 100 mg by mouth daily., Disp: , Rfl:    conjugated estrogens (PREMARIN) vaginal cream, Place 1 Applicatorful vaginally 2 (two) times a week. , Disp: , Rfl:    desonide (DESOWEN) 0.05 % cream, Apply topically 2 (two) times daily., Disp: 60 g, Rfl: 2   famotidine (PEPCID) 40 MG tablet, TAKE 1 TABLET(40 MG) BY MOUTH DAILY, Disp: 90 tablet, Rfl: 1   fexofenadine (ALLEGRA ALLERGY) 180 MG tablet, Take 1 tablet (180 mg total) by mouth daily as needed for allergies or rhinitis., Disp: 30 tablet, Rfl: 5  fish oil-omega-3 fatty acids 1000 MG capsule, Take 3 g by mouth daily., Disp: , Rfl:    fluticasone (FLONASE) 50 MCG/ACT nasal spray, Place 2 sprays into both nostrils daily., Disp: 16 g, Rfl: 5   meclizine (ANTIVERT) 25 MG tablet, Take 25 mg by mouth 3 (three) times daily as needed for dizziness., Disp: , Rfl:    pravastatin (PRAVACHOL) 40 MG tablet, Take 1 tablet (40 mg total) by mouth daily., Disp: 90 tablet, Rfl: 1   solifenacin (VESICARE) 10 MG tablet, Take 1 tablet (10 mg total) by mouth daily., Disp: 90 tablet, Rfl: 1   Suvorexant (BELSOMRA) 15 MG TABS, TAKE 1 TABLET BY MOUTH AT BEDTIME AS NEEDED, Disp: 90 tablet, Rfl: 0   telmisartan  (MICARDIS) 80 MG tablet, TAKE 1 TABLET(80 MG) BY MOUTH DAILY, Disp: 90 tablet, Rfl: 1  Allergies  Allergen Reactions   Amoxicillin-Pot Clavulanate    Beta Adrenergic Blockers     Makes chest pain worse   Crestor [Rosuvastatin]    Indapamide    Latex    Meperidine Hcl    Paxil [Paroxetine]    Amlodipine     Edema    Hctz [Hydrochlorothiazide] Nausea And Vomiting    Objective:   BP 138/80   Pulse 82   Temp 97.6 F (36.4 C)   Resp 20   Ht 5\' 4"  (1.626 m)   Wt 187 lb (84.8 kg)   BMI 32.10 kg/m    Physical Exam Vitals reviewed.  Constitutional:      General: She is not in acute distress.    Appearance: Normal appearance. She is not ill-appearing, toxic-appearing or diaphoretic.  HENT:     Head: Normocephalic and atraumatic.  Eyes:     General: No scleral icterus.       Right eye: No discharge.        Left eye: No discharge.     Conjunctiva/sclera: Conjunctivae normal.  Cardiovascular:     Rate and Rhythm: Normal rate.  Pulmonary:     Effort: Pulmonary effort is normal. No respiratory distress.  Abdominal:     General: Bowel sounds are normal.     Palpations: There is no hepatomegaly, splenomegaly, mass or pulsatile mass.     Tenderness: There is no abdominal tenderness.  Musculoskeletal:        General: Normal range of motion.     Cervical back: Normal range of motion.  Skin:    General: Skin is warm and dry.     Capillary Refill: Capillary refill takes less than 2 seconds.  Neurological:     General: No focal deficit present.     Mental Status: She is alert and oriented to person, place, and time. Mental status is at baseline.  Psychiatric:        Mood and Affect: Mood normal.        Behavior: Behavior normal.        Thought Content: Thought content normal.        Judgment: Judgment normal.

## 2023-06-08 ENCOUNTER — Ambulatory Visit (HOSPITAL_BASED_OUTPATIENT_CLINIC_OR_DEPARTMENT_OTHER)
Admission: RE | Admit: 2023-06-08 | Discharge: 2023-06-08 | Disposition: A | Discharge status: Home / Self Care | Payer: Medicare PPO | Source: Ambulatory Visit | Attending: Family Medicine | Admitting: Family Medicine

## 2023-06-08 DIAGNOSIS — R197 Diarrhea, unspecified: Secondary | ICD-10-CM | POA: Diagnosis not present

## 2023-06-08 DIAGNOSIS — R1110 Vomiting, unspecified: Secondary | ICD-10-CM | POA: Diagnosis not present

## 2023-06-08 DIAGNOSIS — R112 Nausea with vomiting, unspecified: Secondary | ICD-10-CM | POA: Diagnosis not present

## 2023-06-14 ENCOUNTER — Other Ambulatory Visit: Payer: Self-pay | Admitting: Internal Medicine

## 2023-06-14 ENCOUNTER — Encounter: Payer: Self-pay | Admitting: Internal Medicine

## 2023-06-14 DIAGNOSIS — N3281 Overactive bladder: Secondary | ICD-10-CM

## 2023-06-14 MED ORDER — MIRABEGRON ER 50 MG PO TB24
50.0000 mg | ORAL_TABLET | Freq: Every day | ORAL | 0 refills | Status: DC
Start: 2023-06-14 — End: 2023-08-23

## 2023-06-16 DIAGNOSIS — M5441 Lumbago with sciatica, right side: Secondary | ICD-10-CM | POA: Diagnosis not present

## 2023-06-28 ENCOUNTER — Other Ambulatory Visit: Payer: Self-pay | Admitting: Internal Medicine

## 2023-06-28 DIAGNOSIS — F5104 Psychophysiologic insomnia: Secondary | ICD-10-CM

## 2023-06-28 DIAGNOSIS — E785 Hyperlipidemia, unspecified: Secondary | ICD-10-CM

## 2023-07-05 DIAGNOSIS — M4726 Other spondylosis with radiculopathy, lumbar region: Secondary | ICD-10-CM | POA: Diagnosis not present

## 2023-07-05 DIAGNOSIS — M48061 Spinal stenosis, lumbar region without neurogenic claudication: Secondary | ICD-10-CM | POA: Diagnosis not present

## 2023-07-05 DIAGNOSIS — M48062 Spinal stenosis, lumbar region with neurogenic claudication: Secondary | ICD-10-CM | POA: Diagnosis not present

## 2023-07-06 ENCOUNTER — Ambulatory Visit: Payer: Medicare PPO | Admitting: Internal Medicine

## 2023-07-06 ENCOUNTER — Telehealth: Payer: Self-pay | Admitting: *Deleted

## 2023-07-06 NOTE — Telephone Encounter (Signed)
   Pre-operative Risk Assessment    Patient Name: Kayla Khan  DOB: [birth year] MRN: [id number]    DATE OF LAST VISIT: 07/24/22 DR. PEMBERTON DATE OF NEXT VISIT: 07/30/23 [facility] F/U WITH DR. [facility]  Request for Surgical Clearance    Procedure:   LUMBAR LAMINECTOMY  Date of Surgery:  Clearance TBD                                 Surgeon:  DR. Monia Pouch Surgeon's Group or Practice Name:  [facility] Phone number:  [phone] Fax number:  [phone] ATTN: Lowella Bandy   Type of Clearance Requested:   - Medical ; ASA    Type of Anesthesia:  General    Additional requests/questions:    Elpidio Anis   07/06/2023, 9:46 AM

## 2023-07-06 NOTE — Telephone Encounter (Signed)
   Name: Kayla Khan  DOB: 1950/10/31  MRN: [id number]  Primary Cardiologist: Tobias Alexander, MD  Chart reviewed as part of pre-operative protocol coverage. The patient has an upcoming visit scheduled with Dr. Raynelle Jan on 07/30/2023 at which time clearance can be addressed in case there are any issues that would impact surgical recommendations. I added preop FYI to appointment note so that provider is aware to address at time of outpatient visit.  Per office protocol the cardiology provider should forward their finalized clearance decision and recommendations regarding antiplatelet therapy to the requesting party below.     I will route this message as FYI to requesting party and remove this message from the preop box as separate preop APP input not needed at this time.   Please call with any questions.  Napoleon Form, Leodis Rains, NP  07/06/2023, 10:12 AM

## 2023-07-30 ENCOUNTER — Ambulatory Visit: Payer: Medicare PPO | Attending: Internal Medicine | Admitting: Internal Medicine

## 2023-07-30 ENCOUNTER — Encounter: Payer: Self-pay | Admitting: Internal Medicine

## 2023-07-30 VITALS — BP 140/62 | HR 78 | Ht 64.0 in | Wt 188.0 lb

## 2023-07-30 DIAGNOSIS — R011 Cardiac murmur, unspecified: Secondary | ICD-10-CM

## 2023-07-30 DIAGNOSIS — Z01818 Encounter for other preprocedural examination: Secondary | ICD-10-CM | POA: Diagnosis not present

## 2023-07-30 DIAGNOSIS — I10 Essential (primary) hypertension: Secondary | ICD-10-CM

## 2023-07-30 DIAGNOSIS — I2510 Atherosclerotic heart disease of native coronary artery without angina pectoris: Secondary | ICD-10-CM | POA: Diagnosis not present

## 2023-07-30 DIAGNOSIS — I2089 Other forms of angina pectoris: Secondary | ICD-10-CM

## 2023-07-30 DIAGNOSIS — E785 Hyperlipidemia, unspecified: Secondary | ICD-10-CM

## 2023-07-30 NOTE — Patient Instructions (Signed)
Medication Instructions:  Your physician recommends that you continue on your current medications as directed. Please refer to the Current Medication list given to you today.  *If you need a refill on your cardiac medications before your next appointment, please call your pharmacy*   Lab Work: NONE  If you have labs (blood work) drawn today and your tests are completely normal, you will receive your results only by: MyChart Message (if you have MyChart) OR A paper copy in the mail If you have any lab test that is abnormal or we need to change your treatment, we will call you to review the results.   Testing/Procedures: Your physician has requested that you have an echocardiogram. Echocardiography is a painless test that uses sound waves to create images of your heart. It provides your doctor with information about the size and shape of your heart and how well your heart's chambers and valves are working. This procedure takes approximately one hour. There are no restrictions for this procedure. Please do NOT wear cologne, perfume, aftershave, or lotions (deodorant is allowed). Please arrive 15 minutes prior to your appointment time.  Please note: We ask at that you not bring children with you during ultrasound (echo/ vascular) testing. Due to room size and safety concerns, children are not allowed in the ultrasound rooms during exams. Our front office staff cannot provide observation of children in our lobby area while testing is being conducted. An adult accompanying a patient to their appointment will only be allowed in the ultrasound room at the discretion of the ultrasound technician under special circumstances. We apologize for any inconvenience.    Follow-Up: At [facility], you and your health needs are our priority.  As part of our continuing mission to provide you with exceptional heart care, we have created designated Provider Care Teams.  These Care Teams include your  primary Cardiologist (physician) and Advanced Practice Providers (APPs -  Physician Assistants and Nurse Practitioners) who all work together to provide you with the care you need, when you need it.    Your next appointment:   4 month(s)  Provider:   Ronie Spies, PA-C

## 2023-07-30 NOTE — Progress Notes (Signed)
Cardiology Office Note:    Date:  07/30/2023   ID:  Kayla Khan, DOB 02/17/1951, MRN [id number]  PCP:  Kayla Grandchild, MD   [facility] Providers Cardiologist:  Kayla Alexander, MD {  CC: Transition to new cardiologist  History of Present Illness:    Kayla Khan is a 72 y.o. 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 Former Dr. Shari Khan Patient  Miss Kayla Khan, a 72 year old with a history of microvascular disease, presents with a recent increase in dyspnea, particularly noticeable when ascending stairs or walking uphill. This change has been observed over the past six months. The patient has a long-standing history of managing her symptoms with L-citrulline and L-Arginine, which she reports has been effective for over 20 years. However, she has noticed an increase in mouth breathing recently.  In addition to the dyspnea, the patient reports a new heart murmur identified by her general practitioner. She has a history of mild nonobstructive epicardial disease and elevated blood pressure and cholesterol, both of which are generally well controlled. The patient's blood pressure has been fluctuating recently, sometimes presenting as too low.  The patient is also due to undergo back surgery, the date of which is yet to be scheduled. She has noticed an increase in dyspnea when bending over, which she speculates may be related to her back condition.  The patient has a history of medication sensitivity, with several heart drugs causing adverse reactions in the past. She has been managing her coronary microvascular disease with L-Arginine and L-Citrulline, which she reports has been effective in reducing her symptoms.  She is currently taking these supplements in split doses, totaling six grams per day.  The patient's symptoms began in her forties, initially presenting as arm pain before progressing to chest pain within a couple of years. The introduction of L-Arginine and L-Citrulline supplements significantly improved her symptoms, allowing her to continue functioning normally. However, she has noticed a recent increase in dyspnea, particularly when exerting herself physically.  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 h/o such - cor CT 03/2020 with minimal CAD, consider noncardiac causes of pain.   Palpitations    Pre-diabetes    Sleep apnea    Thyroid nodule    The patient had a thyroid nodule that was surgically removed in the past.  She has one half of her thyroid and does not take replacement of Taxol and he was benign    Past Surgical History:  Procedure Laterality Date   APPENDECTOMY     BREAST BIOPSY     CESAREAN SECTION     x2   GANGLION CYST EXCISION  THYROIDECTOMY, PARTIAL     left   TUBAL LIGATION     VESICOVAGINAL FISTULA CLOSURE W/ TAH      Current Medications: No outpatient medications have been marked as taking for the 07/30/23 encounter (Office Visit) with Christell Constant, MD.     Allergies:   Vesicare [solifenacin], Amoxicillin-pot clavulanate, Beta adrenergic blockers, Crestor [rosuvastatin], Indapamide, Latex, Meperidine hcl, Paxil  [paroxetine], Amlodipine, and Hctz [hydrochlorothiazide]   Social History   Socioeconomic History   Marital status: Divorced    Spouse name: Not on file   Number of children: 1   Years of education: Not on file   Highest education level: Bachelor's degree (e.g., BA, AB, BS)  Occupational History   Occupation: Runner, broadcasting/film/video  Tobacco Use   Smoking status: Never    Passive exposure: Past   Smokeless tobacco: Never  Vaping Use   Vaping status: Never Used  Substance and Sexual Activity   Alcohol use: No   Drug use: No   Sexual activity: Not Currently  Other Topics Concern   Not on file  Social History Narrative   Not on file   Social Determinants of Health   Financial Resource Strain: Low Risk  (11/25/2022)   Overall Financial Resource Strain (CARDIA)    Difficulty of Paying Living Expenses: Not hard at all  Food Insecurity: No Food Insecurity (11/25/2022)   Hunger Vital Sign    Worried About Running Out of Food in the Last Year: Never true    Ran Out of Food in the Last Year: Never true  Transportation Needs: No Transportation Needs (11/25/2022)   PRAPARE - Administrator, Civil Service (Medical): No    Lack of Transportation (Non-Medical): No  Physical Activity: Sufficiently Active (11/25/2022)   Exercise Vital Sign    Days of Exercise per Week: 2 days    Minutes of Exercise per Session: 90 min  Stress: Stress Concern Present (11/25/2022)   [employer] of Occupational Health - Occupational Stress Questionnaire    Feeling of Stress : To some extent  Social Connections: Moderately Integrated (11/25/2022)   Social Connection and Isolation Panel [NHANES]    Frequency of Communication with Friends and Family: More than three times a week    Frequency of Social Gatherings with Friends and Family: Three times a week    Attends Religious Services: More than 4 times per year    Active Member of Clubs or Organizations: Yes    Attends Engineer, structural: More  than 4 times per year    Marital Status: Divorced     Family History: The patient's family history includes Aneurysm in an other family member; Aneurysm (age of onset: 27) in her father; Arthritis in an other family member; Cancer in her brother, mother, and another family member; Colon cancer in her son; Diabetes in an other family member; Heart attack in her maternal grandfather; Heart disease in an other family member; Hypertension in her father and another family member; Stroke in her maternal grandmother and paternal grandfather.  ROS:   Please see the history of present illness.     EKGs/Labs/Other Studies Reviewed:    Cardiac Studies & Procedures     STRESS TESTS  MYOCARDIAL PERFUSION IMAGING 03/30/2002      CT SCANS  CT CORONARY MORPH W/CTA COR W/SCORE 03/25/2020  Addendum 03/26/2020  2:26 PM ADDENDUM REPORT: 03/26/2020 14:24  CLINICAL DATA:  72 year old female with h/o hypertension, hyperlipidemia and exertional dyspnea.  EXAM: Cardiac/Coronary  CTA  TECHNIQUE: The patient was scanned on a [employer].  FINDINGS: A 100 kV prospective scan was triggered in the descending thoracic aorta at 111 HU's. Axial non-contrast 3 mm slices were carried out through the heart. The data set was analyzed on a dedicated work station and scored using the Agatson method. Gantry rotation speed was 250 msecs and collimation was .6 mm. 10 mg of PO Ivabradine and 0.8 mg of sl NTG was given. The 3D data set was reconstructed in 5% intervals of the 67-82 % of the R-R cycle. Diastolic phases were analyzed on a dedicated work station using MPR, MIP and VRT modes. The patient received 80 cc of contrast.  Aorta: Normal size. Mild diffuse atherosclerotic plaque and calcifications. No dissection.  Aortic Valve:  Trileaflet.  No calcifications.  Coronary Arteries:  Normal coronary origin.  Right dominance.  RCA is a large dominant artery that gives rise to PDA and PLA. There is  minimal calcified plaque in the ostial portion with stenosis 0-25%, mid and distal RCA have no significant plaque.  Left main is a large artery that gives rise to LAD and LCX arteries. Left main has no plaque.  LAD is a medium caliber vessel that gives rise to one diagonal artery. There is minimal calcified plaque in the proximal portion with stenosis 0-25%.  LCX is a non-dominant artery that gives rise to one large OM1 branch. There is no significant plaque.  Other findings:  Normal pulmonary vein drainage into the left atrium.  Normal left atrial appendage without a thrombus.  Normal size of the pulmonary artery.  IMPRESSION: 1. Coronary calcium score of 6.2. This was 69 percentile for age and sex matched control.  2. Normal coronary origin with right dominance.  3. CAD-RADS 1. Minimal non-obstructive CAD (0-24%). Consider non-atherosclerotic causes of chest pain. Consider preventive therapy and risk factor modification.   Electronically Signed By: Kayla Khan On: 03/26/2020 14:24  Narrative EXAM: OVER-READ INTERPRETATION  CT CHEST  The following report is an over-read performed by radiologist Dr. Trudie Reed of [facility] on 03/25/2020. This over-read does not include interpretation of cardiac or coronary anatomy or pathology. The coronary calcium score/coronary CTA interpretation by the cardiologist is attached.  COMPARISON:  None.  FINDINGS: Aortic atherosclerosis. Within the visualized portions of the thorax there are no suspicious appearing pulmonary nodules or masses, there is no acute consolidative airspace disease, no pleural effusions, no pneumothorax and no lymphadenopathy. Visualized portions of the upper abdomen are unremarkable. There are no aggressive appearing lytic or blastic lesions noted in the visualized portions of the skeleton.  IMPRESSION: 1.  Aortic Atherosclerosis (ICD10-I70.0).  Electronically Signed: By:  Trudie Reed M.D. On: 03/25/2020 10:29           Recent Labs: 11/25/2022: ALT 18; Hemoglobin 13.2; Platelets 240.0; TSH 1.62 03/24/2023: BUN 27; Creatinine, Ser 0.90; Potassium 4.5; Sodium 141  Recent Lipid Panel    Component Value Date/Time   CHOL 139 06/04/2022 1441   CHOL 133 07/21/2021 0814   TRIG 145.0 06/04/2022 1441   HDL 44.50 06/04/2022 1441   HDL 49 07/21/2021 0814   CHOLHDL 3 06/04/2022 1441   VLDL 29.0 06/04/2022 1441   LDLCALC 66 06/04/2022 1441   LDLCALC 71 07/21/2021 0814   LDLDIRECT 92.0 12/15/2016 0934    Physical Exam:    VS:  BP (!) 140/62   Pulse 78   Ht 5\' 4"  (1.626 m)   Wt 188 lb (85.3 kg)  SpO2 95%   BMI 32.27 kg/m     Wt Readings from Last 3 Encounters:  07/30/23 188 lb (85.3 kg)  06/07/23 187 lb (84.8 kg)  05/04/23 191 lb 8 oz (86.9 kg)    GEN: Well nourished, well developed in no acute distress NECK: No JVD; No carotid bruits CARDIAC: RRR, systolic murmur on my exam. No rubs, gallops RESPIRATORY:  Clear to auscultation without rales, wheezing or rhonchi  ABDOMEN: Soft, non-tender, non-distended MUSCULOSKELETAL:  No edema; No deformity  SKIN: Warm and dry NEUROLOGIC:  Alert and oriented x 3 PSYCHIATRIC:  Normal affect   ASSESSMENT:    1. Hyperlipidemia LDL goal <70   2. Mild CAD   3. Microvascular angina (HCC)   4. Preoperative clearance     PLAN:    Coronary Microvascular Disease - Long-standing coronary microvascular disease managed with L-Arginine and L-Citrulline. Symptoms include dyspnea on exertion, particularly when climbing stairs or walking uphill. No significant chest pain reported on current regimen. Discussed potential to increase L-Arginine dosage if symptoms worsen. Mentioned a new medication in phase two clinical trials (cardiac mitocyte) as a future option. Informed consent provided regarding risks and benefits of increasing L-Arginine dosage and potential future use of cardiac mitocyte. - continue current  therapies LDL is at goal for HLD - Continue L-Arginine (3g twice daily) and L-Citrulline - Consider increasing L-Arginine to 4g twice daily if symptoms worsen - Can consider Ranexa - Monitor for new or changing symptoms  Systolic Heart Murmur - DOE and soft systolic murmur noted by Dr. Shari Khan in 2023, likely due to age-related valvular calcification. No immediate signs of severe valvular disease, but further evaluation needed to rule out significant pathology. Informed consent provided regarding the need for echocardiogram and potential outcomes. - Order echocardiogram - Send results to [facility] for pre-surgical risk straitifcation  Hypertension - Fluctuating blood pressure with episodes of both high and low readings. Previous issues with medication tolerability. Discussed potential use of diltiazem or verapamil if hypertension persists, considering coronary microvascular disease. Informed consent provided regarding risks and benefits of these medications. - Monitor blood pressure at home - Consider starting diltiazem or verapamil if hypertension persists  Follow-up - Schedule follow-up appointment with me or Lucile Crater PA-C in February or March 2024.  Medication Adjustments/Labs and Tests Ordered: Current medicines are reviewed at length with the patient today.  Concerns regarding medicines are outlined above.  Orders Placed This Encounter  Procedures   EKG 12-Lead   No orders of the defined types were placed in this encounter.   There are no Patient Instructions on file for this visit.    I,Breanna Adamick,acting as a scribe for [employer], MD.,have documented all relevant documentation on the behalf of Christell Constant, MD,as directed by  Christell Constant, MD while in the presence of Christell Constant, MD.  I, Christell Constant, MD, have reviewed all documentation for this visit. The documentation on 07/30/23  for the exam, diagnosis, procedures, and orders are all accurate and complete.   Signed, Christell Constant, MD  07/30/2023 9:40 AM    [facility]

## 2023-08-03 DIAGNOSIS — G4733 Obstructive sleep apnea (adult) (pediatric): Secondary | ICD-10-CM | POA: Diagnosis not present

## 2023-08-09 ENCOUNTER — Other Ambulatory Visit: Payer: Self-pay | Admitting: Medical Genetics

## 2023-08-10 DIAGNOSIS — M4858XA Collapsed vertebra, not elsewhere classified, sacral and sacrococcygeal region, initial encounter for fracture: Secondary | ICD-10-CM | POA: Diagnosis not present

## 2023-08-10 DIAGNOSIS — M48062 Spinal stenosis, lumbar region with neurogenic claudication: Secondary | ICD-10-CM | POA: Diagnosis not present

## 2023-08-10 DIAGNOSIS — M4856XA Collapsed vertebra, not elsewhere classified, lumbar region, initial encounter for fracture: Secondary | ICD-10-CM | POA: Diagnosis not present

## 2023-08-10 DIAGNOSIS — M47816 Spondylosis without myelopathy or radiculopathy, lumbar region: Secondary | ICD-10-CM | POA: Diagnosis not present

## 2023-08-23 ENCOUNTER — Encounter: Payer: Self-pay | Admitting: Internal Medicine

## 2023-08-23 ENCOUNTER — Ambulatory Visit: Payer: Medicare PPO | Admitting: Internal Medicine

## 2023-08-23 VITALS — BP 138/80 | HR 92 | Temp 98.2°F | Ht 64.0 in | Wt 191.2 lb

## 2023-08-23 DIAGNOSIS — R42 Dizziness and giddiness: Secondary | ICD-10-CM

## 2023-08-23 DIAGNOSIS — N3281 Overactive bladder: Secondary | ICD-10-CM

## 2023-08-23 MED ORDER — SOLIFENACIN SUCCINATE 5 MG PO TABS: 5.0000 mg | ORAL_TABLET | Freq: Every day | ORAL | 0 refills | Status: DC

## 2023-08-23 NOTE — Progress Notes (Unsigned)
   Subjective:   Patient ID: Kayla Khan, female    DOB: 1951/07/13, 72 y.o.   MRN: [id number]  HPI The patient is a 72 YO female coming in for dizziness. Has echo scheduled 08/31/23 through cardiology for undiagnosed murmur.   Review of Systems  Objective:  Physical Exam  Vitals:   08/23/23 1012  TempSrc: Oral  Height: 5\' 4"  (1.626 m)    Assessment & Plan:

## 2023-08-23 NOTE — Patient Instructions (Addendum)
We will have you start back on the vesicare (solifenacin) 5 mg to try again

## 2023-08-26 NOTE — Assessment & Plan Note (Signed)
Had an episode of bppv about 1 week ago and is resolving. Some dizziness with head moving up and down. Encouraged her to use epley and rare meclizine which she has at home. She is getting echo upcoming for murmur and advised her to get this done as well.

## 2023-08-26 NOTE — Assessment & Plan Note (Signed)
She would like to resume vesicare but try lower dose 5 mg daily as 10 mg gave her a lot of dryness.

## 2023-08-31 ENCOUNTER — Ambulatory Visit (HOSPITAL_COMMUNITY): Payer: Medicare PPO | Attending: Cardiology

## 2023-08-31 DIAGNOSIS — I2510 Atherosclerotic heart disease of native coronary artery without angina pectoris: Secondary | ICD-10-CM | POA: Insufficient documentation

## 2023-08-31 DIAGNOSIS — I2089 Other forms of angina pectoris: Secondary | ICD-10-CM | POA: Diagnosis not present

## 2023-08-31 DIAGNOSIS — Z01818 Encounter for other preprocedural examination: Secondary | ICD-10-CM | POA: Insufficient documentation

## 2023-08-31 DIAGNOSIS — R011 Cardiac murmur, unspecified: Secondary | ICD-10-CM | POA: Diagnosis not present

## 2023-08-31 LAB — ECHOCARDIOGRAM COMPLETE
Area-P 1/2: 4.8 cm2
S' Lateral: 2.85 cm

## 2023-09-07 ENCOUNTER — Other Ambulatory Visit (HOSPITAL_COMMUNITY)
Admission: RE | Admit: 2023-09-07 | Discharge: 2023-09-07 | Disposition: A | Discharge status: Home / Self Care | Payer: Self-pay | Source: Ambulatory Visit | Attending: Medical Genetics | Admitting: Medical Genetics

## 2023-09-10 ENCOUNTER — Telehealth: Payer: Self-pay

## 2023-09-10 NOTE — Telephone Encounter (Signed)
   Primary Cardiologist: Stanly DELENA Leavens, MD  Chart reviewed as part of pre-operative protocol coverage. Given past medical history and time since last visit, based on ACC/AHA guidelines, Kayla Khan would be at acceptable risk for the planned procedure without further cardiovascular testing.   Patient was advised that if she develops new symptoms prior to surgery to contact our office to arrange a follow-up appointment. She verbalized understanding.  Per office protocol, she may hold aspirin  for 5-7 days prior to procedure and should resume as soon as hemodynamically stable postoperatively.  I will route this recommendation to the requesting party via Epic fax function and remove from pre-op pool.  Please call with questions.  Kayla EMERSON Bane, NP-C  09/10/2023, 4:54 PM [address], [address] Office [phone] Fax [phone]

## 2023-09-10 NOTE — Telephone Encounter (Signed)
   Pre-operative Risk Assessment    Patient Name: Kayla Khan  DOB: Feb 21, 1951 MRN: [id number]   Date of last office visit: 07/30/23 Dr. Santo Date of next office visit: 11/15/23 Raphael Bring, PA   Request for Surgical Clearance    Procedure:   Lumbar Laminectomy  Date of Surgery:  Clearance TBD                                Surgeon:  Dr. Lani Meadows Surgeon's Group or Practice Name:  [facility] Phone number:  [phone] Fax number:  [phone]   Type of Clearance Requested:   - Medical    Type of Anesthesia:  General    Additional requests/questions:    SignedJolan Daliya Parchment   09/10/2023, 4:17 PM

## 2023-09-20 LAB — GENECONNECT MOLECULAR SCREEN: Genetic Analysis Overall Interpretation: NEGATIVE

## 2023-09-22 ENCOUNTER — Other Ambulatory Visit: Payer: Self-pay | Admitting: Neurological Surgery

## 2023-09-27 ENCOUNTER — Ambulatory Visit: Payer: Medicare PPO | Admitting: Internal Medicine

## 2023-09-28 ENCOUNTER — Other Ambulatory Visit: Payer: Self-pay | Admitting: Neurological Surgery

## 2023-10-01 ENCOUNTER — Ambulatory Visit (INDEPENDENT_AMBULATORY_CARE_PROVIDER_SITE_OTHER): Payer: Medicare PPO | Admitting: Internal Medicine

## 2023-10-01 ENCOUNTER — Encounter: Payer: Self-pay | Admitting: Internal Medicine

## 2023-10-01 VITALS — BP 138/78 | HR 86 | Temp 98.1°F | Resp 16 | Ht 64.0 in | Wt 190.6 lb

## 2023-10-01 DIAGNOSIS — E785 Hyperlipidemia, unspecified: Secondary | ICD-10-CM

## 2023-10-01 DIAGNOSIS — D508 Other iron deficiency anemias: Secondary | ICD-10-CM | POA: Diagnosis not present

## 2023-10-01 DIAGNOSIS — I700 Atherosclerosis of aorta: Secondary | ICD-10-CM | POA: Diagnosis not present

## 2023-10-01 DIAGNOSIS — I2510 Atherosclerotic heart disease of native coronary artery without angina pectoris: Secondary | ICD-10-CM | POA: Diagnosis not present

## 2023-10-01 DIAGNOSIS — I10 Essential (primary) hypertension: Secondary | ICD-10-CM

## 2023-10-01 DIAGNOSIS — R7303 Prediabetes: Secondary | ICD-10-CM | POA: Diagnosis not present

## 2023-10-01 LAB — CBC WITH DIFFERENTIAL/PLATELET
Basophils Absolute: 0 10*3/uL (ref 0.0–0.1)
Basophils Relative: 0.7 % (ref 0.0–3.0)
Eosinophils Absolute: 0.1 10*3/uL (ref 0.0–0.7)
Eosinophils Relative: 2.2 % (ref 0.0–5.0)
HCT: 39.6 % (ref 36.0–46.0)
Hemoglobin: 13.2 g/dL (ref 12.0–15.0)
Lymphocytes Relative: 24.1 % (ref 12.0–46.0)
Lymphs Abs: 1.6 10*3/uL (ref 0.7–4.0)
MCHC: 33.3 g/dL (ref 30.0–36.0)
MCV: 94.8 fL (ref 78.0–100.0)
Monocytes Absolute: 0.5 10*3/uL (ref 0.1–1.0)
Monocytes Relative: 7 % (ref 3.0–12.0)
Neutro Abs: 4.4 10*3/uL (ref 1.4–7.7)
Neutrophils Relative %: 66 % (ref 43.0–77.0)
Platelets: 255 10*3/uL (ref 150.0–400.0)
RBC: 4.18 Mil/uL (ref 3.87–5.11)
RDW: 13.3 % (ref 11.5–15.5)
WBC: 6.6 10*3/uL (ref 4.0–10.5)

## 2023-10-01 LAB — LIPID PANEL
Cholesterol: 127 mg/dL (ref 0–200)
HDL: 41.3 mg/dL (ref >39.00)
LDL Cholesterol: 67 mg/dL (ref 0–99)
NonHDL: 86.09
Total CHOL/HDL Ratio: 3
Triglycerides: 97 mg/dL (ref 0.0–149.0)
VLDL: 19.4 mg/dL (ref 0.0–40.0)

## 2023-10-01 LAB — BASIC METABOLIC PANEL
BUN: 19 mg/dL (ref 6–23)
CO2: 29 meq/L (ref 19–32)
Calcium: 9.4 mg/dL (ref 8.4–10.5)
Chloride: 107 meq/L (ref 96–112)
Creatinine, Ser: 0.82 mg/dL (ref 0.40–1.20)
GFR: 70.18 mL/min (ref >60.00)
Glucose, Bld: 103 mg/dL — ABNORMAL HIGH (ref 70–99)
Potassium: 3.8 meq/L (ref 3.5–5.1)
Sodium: 143 meq/L (ref 135–145)

## 2023-10-01 LAB — HEPATIC FUNCTION PANEL
ALT: 21 U/L (ref 0–35)
AST: 18 U/L (ref 0–37)
Albumin: 4.2 g/dL (ref 3.5–5.2)
Alkaline Phosphatase: 65 U/L (ref 39–117)
Bilirubin, Direct: 0.1 mg/dL (ref 0.0–0.3)
Total Bilirubin: 0.4 mg/dL (ref 0.2–1.2)
Total Protein: 7.2 g/dL (ref 6.0–8.3)

## 2023-10-01 LAB — HEMOGLOBIN A1C: Hgb A1c MFr Bld: 6.2 % (ref 4.6–6.5)

## 2023-10-01 LAB — TSH: TSH: 1.36 u[IU]/mL (ref 0.35–5.50)

## 2023-10-01 MED ORDER — ASPIRIN 81 MG PO TBEC: 81.0000 mg | DELAYED_RELEASE_TABLET | Freq: Every day | ORAL | 3 refills | Status: DC

## 2023-10-01 NOTE — Patient Instructions (Signed)
Hypertension, Adult High blood pressure (hypertension) is when the force of blood pumping through the arteries is too strong. The arteries are the blood vessels that carry blood from the heart throughout the body. Hypertension forces the heart to work harder to pump blood and may cause arteries to become narrow or stiff. Untreated or uncontrolled hypertension can lead to a heart attack, heart failure, a stroke, kidney disease, and other problems. A blood pressure reading consists of a higher number over a lower number. Ideally, your blood pressure should be below 120/80. The first ("top") number is called the systolic pressure. It is a measure of the pressure in your arteries as your heart beats. The second ("bottom") number is called the diastolic pressure. It is a measure of the pressure in your arteries as the heart relaxes. What are the causes? The exact cause of this condition is not known. There are some conditions that result in high blood pressure. What increases the risk? Certain factors may make you more likely to develop high blood pressure. Some of these risk factors are under your control, including: Smoking. Not getting enough exercise or physical activity. Being overweight. Having too much fat, sugar, calories, or salt (sodium) in your diet. Drinking too much alcohol. Other risk factors include: Having a personal history of heart disease, diabetes, high cholesterol, or kidney disease. Stress. Having a family history of high blood pressure and high cholesterol. Having obstructive sleep apnea. Age. The risk increases with age. What are the signs or symptoms? High blood pressure may not cause symptoms. Very high blood pressure (hypertensive crisis) may cause: Headache. Fast or irregular heartbeats (palpitations). Shortness of breath. Nosebleed. Nausea and vomiting. Vision changes. Severe chest pain, dizziness, and seizures. How is this diagnosed? This condition is diagnosed by  measuring your blood pressure while you are seated, with your arm resting on a flat surface, your legs uncrossed, and your feet flat on the floor. The cuff of the blood pressure monitor will be placed directly against the skin of your upper arm at the level of your heart. Blood pressure should be measured at least twice using the same arm. Certain conditions can cause a difference in blood pressure between your right and left arms. If you have a high blood pressure reading during one visit or you have normal blood pressure with other risk factors, you may be asked to: Return on a different day to have your blood pressure checked again. Monitor your blood pressure at home for 1 week or longer. If you are diagnosed with hypertension, you may have other blood or imaging tests to help your health care provider understand your overall risk for other conditions. How is this treated? This condition is treated by making healthy lifestyle changes, such as eating healthy foods, exercising more, and reducing your alcohol intake. You may be referred for counseling on a healthy diet and physical activity. Your health care provider may prescribe medicine if lifestyle changes are not enough to get your blood pressure under control and if: Your systolic blood pressure is above 130. Your diastolic blood pressure is above 80. Your personal target blood pressure may vary depending on your medical conditions, your age, and other factors. Follow these instructions at home: Eating and drinking  Eat a diet that is high in fiber and potassium, and low in sodium, added sugar, and fat. An example of this eating plan is called the DASH diet. DASH stands for Dietary Approaches to Stop Hypertension. To eat this way: Eat  plenty of fresh fruits and vegetables. Try to fill one half of your plate at each meal with fruits and vegetables. Eat whole grains, such as whole-wheat pasta, brown rice, or whole-grain bread. Fill about one  fourth of your plate with whole grains. Eat or drink low-fat dairy products, such as skim milk or low-fat yogurt. Avoid fatty cuts of meat, processed or cured meats, and poultry with skin. Fill about one fourth of your plate with lean proteins, such as fish, chicken without skin, beans, eggs, or tofu. Avoid pre-made and processed foods. These tend to be higher in sodium, added sugar, and fat. Reduce your daily sodium intake. Many people with hypertension should eat less than 1,500 mg of sodium a day. Do not drink alcohol if: Your health care provider tells you not to drink. You are pregnant, may be pregnant, or are planning to become pregnant. If you drink alcohol: Limit how much you have to: 0-1 drink a day for women. 0-2 drinks a day for men. Know how much alcohol is in your drink. In the [place] one drink equals one 12 oz bottle of beer (355 mL), one 5 oz glass of wine (148 mL), or one 1 oz glass of hard liquor (44 mL). Lifestyle  Work with your health care provider to maintain a healthy body weight or to lose weight. Ask what an ideal weight is for you. Get at least 30 minutes of exercise that causes your heart to beat faster (aerobic exercise) most days of the week. Activities may include walking, swimming, or biking. Include exercise to strengthen your muscles (resistance exercise), such as Pilates or lifting weights, as part of your weekly exercise routine. Try to do these types of exercises for 30 minutes at least 3 days a week. Do not use any products that contain nicotine or tobacco. These products include cigarettes, chewing tobacco, and vaping devices, such as e-cigarettes. If you need help quitting, ask your health care provider. Monitor your blood pressure at home as told by your health care provider. Keep all follow-up visits. This is important. Medicines Take over-the-counter and prescription medicines only as told by your health care provider. Follow directions carefully. Blood  pressure medicines must be taken as prescribed. Do not skip doses of blood pressure medicine. Doing this puts you at risk for problems and can make the medicine less effective. Ask your health care provider about side effects or reactions to medicines that you should watch for. Contact a health care provider if you: Think you are having a reaction to a medicine you are taking. Have headaches that keep coming back (recurring). Feel dizzy. Have swelling in your ankles. Have trouble with your vision. Get help right away if you: Develop a severe headache or confusion. Have unusual weakness or numbness. Feel faint. Have severe pain in your chest or abdomen. Vomit repeatedly. Have trouble breathing. These symptoms may be an emergency. Get help right away. Call 911. Do not wait to see if the symptoms will go away. Do not drive yourself to the hospital. Summary Hypertension is when the force of blood pumping through your arteries is too strong. If this condition is not controlled, it may put you at risk for serious complications. Your personal target blood pressure may vary depending on your medical conditions, your age, and other factors. For most people, a normal blood pressure is less than 120/80. Hypertension is treated with lifestyle changes, medicines, or a combination of both. Lifestyle changes include losing weight, eating a healthy,  low-sodium diet, exercising more, and limiting alcohol. This information is not intended to replace advice given to you by your health care provider. Make sure you discuss any questions you have with your health care provider. Document Revised: 07/01/2021 Document Reviewed: 07/01/2021 Elsevier Patient Education  2024 [employer].

## 2023-10-01 NOTE — Progress Notes (Signed)
Subjective:  Patient ID: Kayla Khan, female    DOB: 10-26-1950  Age: 73 y.o. MRN: [id number]  CC: Hypertension and Hyperlipidemia   HPI Kayla Khan presents for f/up ----  Discussed the use of AI scribe software for clinical note transcription with the patient, who gave verbal consent to proceed.  History of Present Illness   The patient, with a history of cardiovascular disease, is experiencing shortness of breath, particularly when ascending stairs. She recently consulted with a new cardiologist, Dr. Izora Ribas, who provided a satisfactory explanation of her condition.  Recently, the patient noticed a new onset of hand tremors, first observed before Christmas. She denies any associated difficulty with walking or speech, although she does report some word-finding difficulties, which she attributes to age and hormonal changes post-menopause.  The patient's blood pressure was noted to be slightly elevated, but she attributes this to the stress of the doctor's visit. She denies any associated headaches or blurred vision. She is currently on a statin medication and denies any muscle aches related to this.  She expresses concern about differentiating her hand tremors from Parkinson's disease, but reports no other symptoms such as stiffness, rigidity, or gait disturbances. She is hopeful that her leg pain will resolve following her upcoming back surgery.  The patient's last cholesterol level check was over a year ago, and she consents to having her thyroid and cholesterol levels checked during this visit. She had breakfast prior to the visit, which may affect the triglyceride levels in the cholesterol test.       Outpatient Medications Prior to Visit  Medication Sig Dispense Refill   ALPRAZolam (XANAX) 0.25 MG tablet TAKE 1 TABLET(0.25 MG) BY MOUTH TWICE DAILY AS NEEDED 35 tablet 3   Arginine 1000 MG TABS Take 3,000 mg by mouth in the morning and at bedtime.     azelastine  (ASTELIN) 0.1 % nasal spray Place 1 spray into both nostrils 2 (two) times daily as needed. Use in each nostril as directed 30 mL 5   Cholecalciferol (VITAMIN D) 50 MCG (2000 UT) tablet Take 2,000 Units by mouth daily.     ciclopirox (LOPROX) 0.77 % cream Apply topically 2 (two) times daily. 90 g 2   clobetasol ointment (TEMOVATE) 0.05 % Apply 1 application topically 2 (two) times daily. 30 g 2   co-enzyme Q-10 30 MG capsule Take 100 mg by mouth daily.     conjugated estrogens (PREMARIN) vaginal cream Place 1 Applicatorful vaginally 2 (two) times a week.      desonide (DESOWEN) 0.05 % cream Apply topically 2 (two) times daily. 60 g 2   famotidine (PEPCID) 40 MG tablet TAKE 1 TABLET(40 MG) BY MOUTH DAILY 90 tablet 1   fexofenadine (ALLEGRA ALLERGY) 180 MG tablet Take 1 tablet (180 mg total) by mouth daily as needed for allergies or rhinitis. 30 tablet 5   fish oil-omega-3 fatty acids 1000 MG capsule Take 3 g by mouth daily.     fluticasone (FLONASE) 50 MCG/ACT nasal spray Place 2 sprays into both nostrils daily. (Patient taking differently: Place 2 sprays into both nostrils as needed for allergies.) 16 g 5   meclizine (ANTIVERT) 25 MG tablet Take 25 mg by mouth 3 (three) times daily as needed for dizziness.     pravastatin (PRAVACHOL) 40 MG tablet TAKE 1 TABLET(40 MG) BY MOUTH DAILY 90 tablet 1   solifenacin (VESICARE) 5 MG tablet Take 1 tablet (5 mg total) by mouth daily. 90 tablet 0  Suvorexant (BELSOMRA) 15 MG TABS TAKE 1 TABLET BY MOUTH AT BEDTIME AS NEEDED 90 tablet 0   telmisartan (MICARDIS) 80 MG tablet TAKE 1 TABLET(80 MG) BY MOUTH DAILY 90 tablet 1   aspirin EC 81 MG tablet Take 81 mg by mouth daily.     No facility-administered medications prior to visit.    ROS Review of Systems  Constitutional: Negative.  Negative for appetite change, chills, diaphoresis, fatigue and fever.  HENT: Negative.    Eyes:  Negative for visual disturbance.  Respiratory:  Positive for shortness of  breath (DOE). Negative for cough, chest tightness and wheezing.   Cardiovascular:  Negative for chest pain, palpitations and leg swelling.  Gastrointestinal:  Negative for abdominal pain, constipation, diarrhea, nausea and vomiting.  Endocrine: Negative.   Genitourinary: Negative.  Negative for difficulty urinating.  Musculoskeletal:  Positive for back pain. Negative for neck stiffness.  Skin: Negative.   Neurological:  Positive for tremors. Negative for dizziness, weakness and light-headedness.  Hematological:  Negative for adenopathy. Does not bruise/bleed easily.    Objective:  BP 138/78 (BP Location: Left Arm, Patient Position: Sitting, Cuff Size: Normal)   Pulse 86   Temp 98.1 F (36.7 C) (Oral)   Resp 16   Ht 5\' 4"  (1.626 m)   Wt 190 lb 9.6 oz (86.5 kg)   SpO2 93%   BMI 32.72 kg/m   BP Readings from Last 3 Encounters:  10/01/23 138/78  08/23/23 138/80  07/30/23 (!) 140/62    Wt Readings from Last 3 Encounters:  10/01/23 190 lb 9.6 oz (86.5 kg)  08/23/23 191 lb 4 oz (86.8 kg)  07/30/23 188 lb (85.3 kg)    Physical Exam Vitals reviewed.  Constitutional:      Appearance: Normal appearance.  HENT:     Nose: Nose normal.     Mouth/Throat:     Mouth: Mucous membranes are moist.  Eyes:     General: No scleral icterus.    Conjunctiva/sclera: Conjunctivae normal.  Cardiovascular:     Rate and Rhythm: Normal rate and regular rhythm.     Heart sounds: No murmur heard.    No friction rub. No gallop.  Pulmonary:     Effort: Pulmonary effort is normal.     Breath sounds: No stridor. No wheezing, rhonchi or rales.  Abdominal:     General: Abdomen is protuberant. There is no distension.     Palpations: There is no hepatomegaly, splenomegaly or mass.     Tenderness: There is no abdominal tenderness. There is no guarding.     Hernia: No hernia is present.  Musculoskeletal:        General: Normal range of motion.     Cervical back: Neck supple.     Right lower leg: No  edema.     Left lower leg: No edema.  Lymphadenopathy:     Cervical: No cervical adenopathy.  Skin:    General: Skin is warm and dry.  Neurological:     General: No focal deficit present.     Mental Status: She is alert. Mental status is at baseline.  Psychiatric:        Mood and Affect: Mood normal.        Behavior: Behavior normal.     Lab Results  Component Value Date   WBC 6.6 10/01/2023   HGB 13.2 10/01/2023   HCT 39.6 10/01/2023   PLT 255.0 10/01/2023   GLUCOSE 103 (H) 10/01/2023   CHOL 127 10/01/2023  TRIG 97.0 10/01/2023   HDL 41.30 10/01/2023   LDLDIRECT 92.0 12/15/2016   LDLCALC 67 10/01/2023   ALT 21 10/01/2023   AST 18 10/01/2023   NA 143 10/01/2023   K 3.8 10/01/2023   CL 107 10/01/2023   CREATININE 0.82 10/01/2023   BUN 19 10/01/2023   CO2 29 10/01/2023   TSH 1.36 10/01/2023   HGBA1C 6.2 10/01/2023   MICROALBUR <0.7 03/24/2023    No results found.  Assessment & Plan:   Prediabetes- Her A1c is up to 6.2%. -     Hemoglobin A1c; Future -     Basic metabolic panel; Future  Hyperlipidemia LDL goal <70 - LDL goal achieved. Doing well on the statin  -     Lipid panel; Future -     Hepatic function panel; Future -     TSH; Future  Essential hypertension- Her blood pressure is well-controlled. -     CBC with Differential/Platelet; Future -     TSH; Future  Iron deficiency anemia secondary to inadequate dietary iron intake -     CBC with Differential/Platelet; Future  Atherosclerosis of aorta (HCC)- Risk factor modifications addressed. -     Lipid panel; Future -     Aspirin; Take 1 tablet (81 mg total) by mouth daily.  Dispense: 90 tablet; Refill: 3  Mild CAD -     Aspirin; Take 1 tablet (81 mg total) by mouth daily.  Dispense: 90 tablet; Refill: 3     Follow-up: Return in about 6 months (around 03/30/2024).  Sanda Linger, MD

## 2023-10-04 ENCOUNTER — Other Ambulatory Visit: Payer: Self-pay | Admitting: Internal Medicine

## 2023-10-04 DIAGNOSIS — K2100 Gastro-esophageal reflux disease with esophagitis, without bleeding: Secondary | ICD-10-CM

## 2023-10-04 DIAGNOSIS — I10 Essential (primary) hypertension: Secondary | ICD-10-CM

## 2023-10-15 NOTE — Progress Notes (Signed)
 Surgical Instructions   Your procedure is scheduled on October 21, 2023. Report to [facility] Main Entrance "A" at 7:55 A.M., then check in with the Admitting office. Any questions or running late day of surgery: call [phone]  Questions prior to your surgery date: call [phone], Monday-Friday, 8am-4pm. If you experience any cold or flu symptoms such as cough, fever, chills, shortness of breath, etc. between now and your scheduled surgery, please notify us  at the above number.     Remember:  Do not eat or drink after midnight the night before your surgery     Take these medicines the morning of surgery with A SIP OF WATER  famotidine  (PEPCID )  pravastatin  (PRAVACHOL )  solifenacin  (VESICARE )   May take these medicines IF NEEDED: ALPRAZolam  (XANAX )  azelastine  (ASTELIN )  fluticasone  (FLONASE )  meclizine  (ANTIVERT  )  One week prior to surgery, STOP taking any Aspirin  (unless otherwise instructed by your surgeon) Aleve, Naproxen, Ibuprofen, Motrin, Advil, Goody's, BC's, all herbal medications, fish oil, and non-prescription vitamins.                     Do NOT Smoke (Tobacco/Vaping) for 24 hours prior to your procedure.  If you use a CPAP at night, you may bring your mask/headgear for your overnight stay.   You will be asked to remove any contacts, glasses, piercing's, hearing aid's, dentures/partials prior to surgery. Please bring cases for these items if needed.    Patients discharged the day of surgery will not be allowed to drive home, and someone needs to stay with them for 24 hours.  SURGICAL WAITING ROOM VISITATION Patients may have no more than 2 support people in the waiting area - these visitors may rotate.   Pre-op nurse will coordinate an appropriate time for 1 ADULT support person, who may not rotate, to accompany patient in pre-op.  Children under the age of 47 must have an adult with them who is not the patient and must remain in the main waiting area with  an adult.  If the patient needs to stay at the hospital during part of their recovery, the visitor guidelines for inpatient rooms apply.  Please refer to the [facility] website for the visitor guidelines for any additional information.   If you received a COVID test during your pre-op visit  it is requested that you wear a mask when out in public, stay away from anyone that may not be feeling well and notify your surgeon if you develop symptoms. If you have been in contact with anyone that has tested positive in the last 10 days please notify you surgeon.      Pre-operative 5 CHG Bathing Instructions   You can play a key role in reducing the risk of infection after surgery. Your skin needs to be as free of germs as possible. You can reduce the number of germs on your skin by washing with CHG (chlorhexidine  gluconate) soap before surgery. CHG is an antiseptic soap that kills germs and continues to kill germs even after washing.   DO NOT use if you have an allergy to chlorhexidine /CHG or antibacterial soaps. If your skin becomes reddened or irritated, stop using the CHG and notify one of our RNs at [phone].   Please shower with the CHG soap starting 4 days before surgery using the following schedule:     Please keep in mind the following:  DO NOT shave, including legs and underarms, starting the day of your first shower.  You may shave your face at any point before/day of surgery.  Place clean sheets on your bed the day you start using CHG soap. Use a clean washcloth (not used since being washed) for each shower. DO NOT sleep with pets once you start using the CHG.   CHG Shower Instructions:  Wash your face and private area with normal soap. If you choose to wash your hair, wash first with your normal shampoo.  After you use shampoo/soap, rinse your hair and body thoroughly to remove shampoo/soap residue.  Turn the water OFF and apply about 3 tablespoons (45 ml) of CHG soap to a  CLEAN washcloth.  Apply CHG soap ONLY FROM YOUR NECK DOWN TO YOUR TOES (washing for 3-5 minutes)  DO NOT use CHG soap on face, private areas, open wounds, or sores.  Pay special attention to the area where your surgery is being performed.  If you are having back surgery, having someone wash your back for you may be helpful. Wait 2 minutes after CHG soap is applied, then you may rinse off the CHG soap.  Pat dry with a clean towel  Put on clean clothes/pajamas   If you choose to wear lotion, please use ONLY the CHG-compatible lotions that are listed below.  Additional instructions for the day of surgery: DO NOT APPLY any lotions, deodorants, cologne, or perfumes.   Do not bring valuables to the hospital. [facility] is not responsible for any belongings/valuables. Do not wear nail polish, gel polish, artificial nails, or any other type of covering on natural nails (fingers and toes) Do not wear jewelry or makeup Put on clean/comfortable clothes.  Please brush your teeth.  Ask your nurse before applying any prescription medications to the skin.     CHG Compatible Lotions   Aveeno Moisturizing lotion  Cetaphil Moisturizing Cream  Cetaphil Moisturizing Lotion  Clairol Herbal Essence Moisturizing Lotion, Dry Skin  Clairol Herbal Essence Moisturizing Lotion, Extra Dry Skin  Clairol Herbal Essence Moisturizing Lotion, Normal Skin  Curel Age Defying Therapeutic Moisturizing Lotion with Alpha Hydroxy  Curel Extreme Care Body Lotion  Curel Soothing Hands Moisturizing Hand Lotion  Curel Therapeutic Moisturizing Cream, Fragrance-Free  Curel Therapeutic Moisturizing Lotion, Fragrance-Free  Curel Therapeutic Moisturizing Lotion, Original Formula  Eucerin Daily Replenishing Lotion  Eucerin Dry Skin Therapy Plus Alpha Hydroxy Crme  Eucerin Dry Skin Therapy Plus Alpha Hydroxy Lotion  Eucerin Original Crme  Eucerin Original Lotion  Eucerin Plus Crme Eucerin Plus Lotion  Eucerin TriLipid  Replenishing Lotion  Keri Anti-Bacterial Hand Lotion  Keri Deep Conditioning Original Lotion Dry Skin Formula Softly Scented  Keri Deep Conditioning Original Lotion, Fragrance Free Sensitive Skin Formula  Keri Lotion Fast Absorbing Fragrance Free Sensitive Skin Formula  Keri Lotion Fast Absorbing Softly Scented Dry Skin Formula  Keri Original Lotion  Keri Skin Renewal Lotion Keri Silky Smooth Lotion  Keri Silky Smooth Sensitive Skin Lotion  Nivea Body Creamy Conditioning Oil  Nivea Body Extra Enriched Lotion  Nivea Body Original Lotion  Nivea Body Sheer Moisturizing Lotion Nivea Crme  Nivea Skin Firming Lotion  NutraDerm 30 Skin Lotion  NutraDerm Skin Lotion  NutraDerm Therapeutic Skin Cream  NutraDerm Therapeutic Skin Lotion  ProShield Protective Hand Cream  Provon moisturizing lotion  Please read over the following fact sheets that you were given.

## 2023-10-18 ENCOUNTER — Encounter (HOSPITAL_COMMUNITY)
Admission: RE | Admit: 2023-10-18 | Discharge: 2023-10-18 | Disposition: A | Discharge status: Home / Self Care | Payer: Medicare PPO | Source: Ambulatory Visit | Attending: Neurological Surgery | Admitting: Neurological Surgery

## 2023-10-18 ENCOUNTER — Other Ambulatory Visit: Payer: Self-pay

## 2023-10-18 ENCOUNTER — Encounter (HOSPITAL_COMMUNITY): Payer: Self-pay

## 2023-10-18 DIAGNOSIS — Z01812 Encounter for preprocedural laboratory examination: Secondary | ICD-10-CM | POA: Insufficient documentation

## 2023-10-18 HISTORY — DX: Other complications of anesthesia, initial encounter: T88.59XA

## 2023-10-18 HISTORY — DX: Atherosclerotic heart disease of native coronary artery without angina pectoris: I25.10

## 2023-10-18 HISTORY — DX: Nausea with vomiting, unspecified: R11.2

## 2023-10-18 HISTORY — DX: Other specified postprocedural states: Z98.890

## 2023-10-18 HISTORY — DX: Pneumonia, unspecified organism: J18.9

## 2023-10-18 HISTORY — DX: Other specified postprocedural states: R11.2

## 2023-10-18 LAB — SURGICAL PCR SCREEN
MRSA, PCR: NEGATIVE
Staphylococcus aureus: NEGATIVE

## 2023-10-18 NOTE — Progress Notes (Signed)
 PCP - Sandra Crouch, MD Cardiologist -  Gloriann Larger, MD  PPM/ICD - Denies -   Chest x-ray - Denies EKG - 07/30/2023 Stress Test - 08/08/2007 ECHO - 08/31/2023 Cardiac Cath -   Sleep Study - OSA positive CPAP: Settings on 5  DM: Pre diabetic  Blood Thinner Instructions: N/A Aspirin  Instructions: Per cardiologist, patient to hold ASA 5-7 days prior to procedure. Patient states last dose on 10/12/2023.  ERAS Protcol - No, NPO PRE-SURGERY Ensure or G2- N/A  COVID TEST- N/A   Anesthesia review: Yes, cardiac history  Patient denies shortness of breath, fever, cough and chest pain at PAT appointment   All instructions explained to the patient, with a verbal understanding of the material. Patient agrees to go over the instructions while at home for a better understanding.The opportunity to ask questions was provided.

## 2023-10-19 ENCOUNTER — Other Ambulatory Visit: Payer: Self-pay | Admitting: Internal Medicine

## 2023-10-19 ENCOUNTER — Encounter: Payer: Self-pay | Admitting: Internal Medicine

## 2023-10-19 ENCOUNTER — Ambulatory Visit: Payer: Medicare PPO | Admitting: Internal Medicine

## 2023-10-19 VITALS — BP 148/84 | HR 96 | Temp 97.7°F | Ht 64.0 in | Wt 185.2 lb

## 2023-10-19 DIAGNOSIS — K580 Irritable bowel syndrome with diarrhea: Secondary | ICD-10-CM | POA: Diagnosis not present

## 2023-10-19 DIAGNOSIS — A09 Infectious gastroenteritis and colitis, unspecified: Secondary | ICD-10-CM | POA: Diagnosis not present

## 2023-10-19 MED ORDER — RIFAXIMIN 550 MG PO TABS: 550.0000 mg | ORAL_TABLET | Freq: Three times a day (TID) | ORAL | 2 refills | Status: AC

## 2023-10-19 NOTE — Progress Notes (Unsigned)
Subjective:  Patient ID: Kayla Khan, female    DOB: Mar 14, 1951  Age: 73 y.o. MRN: [id number]  CC: No chief complaint on file.   HPI Kayla Khan presents for ***  Outpatient Medications Prior to Visit  Medication Sig Dispense Refill   ALPRAZolam (XANAX) 0.25 MG tablet TAKE 1 TABLET(0.25 MG) BY MOUTH TWICE DAILY AS NEEDED 35 tablet 3   aspirin EC 81 MG tablet Take 1 tablet (81 mg total) by mouth daily. 90 tablet 3   azelastine (ASTELIN) 0.1 % nasal spray Place 1 spray into both nostrils 2 (two) times daily as needed. Use in each nostril as directed 30 mL 5   BENFOTIAMINE PO Take 300 mcg by mouth daily.     cholecalciferol (VITAMIN D3) 25 MCG (1000 UNIT) tablet Take 2,000 Units by mouth daily.     ciclopirox (LOPROX) 0.77 % cream Apply topically 2 (two) times daily. (Patient taking differently: Apply 1 Application topically 2 (two) times daily as needed (fungus).) 90 g 2   CITRULLINE PO Take 750 mg by mouth 2 (two) times daily.     clobetasol ointment (TEMOVATE) 0.05 % Apply 1 application topically 2 (two) times daily. (Patient taking differently: Apply 1 application  topically 2 (two) times a week.) 30 g 2   Coenzyme Q10 (COQ10) 100 MG CAPS Take 100 mg by mouth daily.     conjugated estrogens (PREMARIN) vaginal cream Place 1 Applicatorful vaginally 2 (two) times a week.      cyanocobalamin (VITAMIN B12) 1000 MCG tablet Take 1,000 mcg by mouth daily.     desonide (DESOWEN) 0.05 % cream Apply topically 2 (two) times daily. (Patient taking differently: Apply 1 Application topically 2 (two) times daily as needed (itching).) 60 g 2   famotidine (PEPCID) 40 MG tablet TAKE 1 TABLET(40 MG) BY MOUTH DAILY 90 tablet 1   fish oil-omega-3 fatty acids 1000 MG capsule Take 3 g by mouth daily.     fluticasone (FLONASE) 50 MCG/ACT nasal spray Place 2 sprays into both nostrils daily. (Patient taking differently: Place 2 sprays into both nostrils as needed for allergies.) 16 g 5   L-Theanine 200 MG  CAPS Take 200 mg by mouth daily.     meclizine (ANTIVERT) 25 MG tablet Take 25 mg by mouth 3 (three) times daily as needed for dizziness.     melatonin 5 MG TABS Take 5 mg by mouth at bedtime.     Multiple Vitamin (MULTIVITAMIN WITH MINERALS) TABS tablet Take 1 tablet by mouth daily.     pravastatin (PRAVACHOL) 40 MG tablet TAKE 1 TABLET(40 MG) BY MOUTH DAILY 90 tablet 1   solifenacin (VESICARE) 5 MG tablet Take 1 tablet (5 mg total) by mouth daily. 90 tablet 0   Suvorexant (BELSOMRA) 15 MG TABS TAKE 1 TABLET BY MOUTH AT BEDTIME AS NEEDED 90 tablet 0   telmisartan (MICARDIS) 80 MG tablet TAKE 1 TABLET(80 MG) BY MOUTH DAILY 90 tablet 1   No facility-administered medications prior to visit.    ROS Review of Systems  Objective:  BP (!) 148/84 (BP Location: Left Arm, Patient Position: Sitting, Cuff Size: Normal)   Pulse 96   Temp 97.7 F (36.5 C) (Oral)   Ht 5\' 4"  (1.626 m)   Wt 185 lb 3.2 oz (84 kg)   SpO2 94%   BMI 31.79 kg/m   BP Readings from Last 3 Encounters:  10/19/23 (!) 148/84  10/18/23 (!) 156/87  10/01/23 138/78    Wt Readings  from Last 3 Encounters:  10/19/23 185 lb 3.2 oz (84 kg)  10/18/23 185 lb (83.9 kg)  10/01/23 190 lb 9.6 oz (86.5 kg)    Physical Exam  Lab Results  Component Value Date   WBC 6.6 10/01/2023   HGB 13.2 10/01/2023   HCT 39.6 10/01/2023   PLT 255.0 10/01/2023   GLUCOSE 103 (H) 10/01/2023   CHOL 127 10/01/2023   TRIG 97.0 10/01/2023   HDL 41.30 10/01/2023   LDLDIRECT 92.0 12/15/2016   LDLCALC 67 10/01/2023   ALT 21 10/01/2023   AST 18 10/01/2023   NA 143 10/01/2023   K 3.8 10/01/2023   CL 107 10/01/2023   CREATININE 0.82 10/01/2023   BUN 19 10/01/2023   CO2 29 10/01/2023   TSH 1.36 10/01/2023   HGBA1C 6.2 10/01/2023   MICROALBUR <0.7 03/24/2023    No results found.  Assessment & Plan:  Diarrhea of infectious origin -     CALPROTECTIN; Future -     Fecal lactoferrin, quant; Future -     GI Profile, Stool, PCR;  Future  Irritable bowel syndrome with diarrhea -     rifAXIMin; Take 1 tablet (550 mg total) by mouth 3 (three) times daily for 14 days.  Dispense: 42 tablet; Refill: 2     Follow-up: No follow-ups on file.  Sanda Linger, MD

## 2023-10-19 NOTE — Patient Instructions (Signed)
Chronic Diarrhea Chronic diarrhea is when a person passes frequent loose and sometimes watery stools for 4 weeks or longer. Non-chronic diarrhea usually lasts for only 2-3 days. Diarrhea can cause a person to feel weak and become dehydrated. Dehydration is a condition in which there is not enough water or other fluids in the body. Dehydration can make the person tired and thirsty. It can also cause a dry mouth, decreased urination, and dark yellow urine. Diarrhea is a sign of an underlying problem, such as: Infection. Side effects of medicines. Problems digesting something in your diet, such as milk products if you have lactose intolerance. Conditions such as celiac disease, irritable bowel syndrome (IBS), or inflammatory bowel disease (IBD). If you have chronic diarrhea, make sure you treat it as told by your health care provider. Follow these instructions at home: Medicines Take over-the-counter and prescription medicines only as told by your health care provider. If you were prescribed antibiotics, take them as told by your health care provider. Do not stop taking the antibiotic even if you start to feel better. Eating and drinking  Follow instructions from your health care provider about what to eat and drink. You may have to: Avoid foods that trigger diarrhea for you. Take an oral rehydration solution (ORS). This is a drink that keeps you hydrated. It can be found at pharmacies and retail stores. Drink clear fluids, such as water, diluted fruit juice, and low-calorie sports drinks. You can also get fluids by sucking on ice chips. Drink enough fluid to keep your urine pale yellow. This will help you avoid dehydration. Eat small amounts of bland foods that are easy to digest as you are able. These foods include bananas, applesauce, rice, lean meats, toast, and crackers. Avoid spicy or fatty foods. Avoid foods and drinks that contain a lot of sugar or caffeine. Do not drink alcohol if: Your  health care provider tells you not to drink. You are pregnant, may be pregnant, or are planning to become pregnant. If you drink alcohol: Limit how much you have to: 0-1 drink a day for women. 0-2 drinks a day for men. Know how much alcohol is in your drink. In the [place] one drink equals one 12 oz bottle of beer (355 mL), one 5 oz glass of wine (148 mL), or one 1 oz glass of hard liquor (44 mL). General instructions  Wash your hands often and after each diarrhea episode for at least 20 seconds. Use soap and water. If soap and water are not available, use hand sanitizer. Make sure that all people in your household wash their hands well and often. Rest as told by your health care provider. Take a warm bath to relieve any burning or pain from frequent diarrhea episodes. Watch your condition for any changes. Contact a health care provider if: You have a fever. Your diarrhea gets worse or does not get better. You have new symptoms. You vomit every time you eat or drink. You feel light-headed or dizzy. You have muscle cramps. You have severe pain in the rectum. You have signs of dehydration, such as: Dark urine, very little urine, or no urine. Cracked lips. Dry mouth. Sunken eyes. Sleepiness. Weakness. You have bloody or black stools, or stools that look like tar. You have severe pain, cramping, or bloating in your abdomen, or pain that stays in one place. Your skin feels cold and clammy. You feel confused. You have a severe headache. Get help right away if: You have chest pain  or your heart is beating very quickly. You have trouble breathing or you are breathing very quickly. You feel extremely weak or you faint. These symptoms may be an emergency. Get help right away. Call 911. Do not wait to see if the symptoms will go away. Do not drive yourself to the hospital. This information is not intended to replace advice given to you by your health care provider. Make sure you discuss  any questions you have with your health care provider. Document Revised: 02/10/2022 Document Reviewed: 02/10/2022 Elsevier Patient Education  2024 [employer].

## 2023-10-20 ENCOUNTER — Other Ambulatory Visit: Payer: Self-pay | Admitting: Internal Medicine

## 2023-10-20 ENCOUNTER — Other Ambulatory Visit: Payer: Medicare PPO

## 2023-10-20 DIAGNOSIS — A09 Infectious gastroenteritis and colitis, unspecified: Secondary | ICD-10-CM

## 2023-10-21 ENCOUNTER — Ambulatory Visit (HOSPITAL_COMMUNITY): Admission: RE | Admit: 2023-10-21 | Payer: Medicare PPO | Source: Home / Self Care | Admitting: Neurological Surgery

## 2023-10-21 ENCOUNTER — Encounter (HOSPITAL_COMMUNITY): Admission: RE | Payer: Self-pay | Source: Home / Self Care

## 2023-10-21 DIAGNOSIS — Z01818 Encounter for other preprocedural examination: Secondary | ICD-10-CM

## 2023-10-21 SURGERY — LUMBAR LAMINECTOMY/DECOMPRESSION MICRODISCECTOMY 1 LEVEL
Anesthesia: General

## 2023-10-22 ENCOUNTER — Other Ambulatory Visit: Payer: Medicare PPO

## 2023-10-22 DIAGNOSIS — A09 Infectious gastroenteritis and colitis, unspecified: Secondary | ICD-10-CM | POA: Diagnosis not present

## 2023-10-22 LAB — GI PROFILE, STOOL, PCR

## 2023-10-23 ENCOUNTER — Encounter: Payer: Self-pay | Admitting: Internal Medicine

## 2023-10-25 ENCOUNTER — Other Ambulatory Visit: Payer: Self-pay | Admitting: Internal Medicine

## 2023-10-25 DIAGNOSIS — A0472 Enterocolitis due to Clostridium difficile, not specified as recurrent: Secondary | ICD-10-CM | POA: Insufficient documentation

## 2023-10-25 MED ORDER — VANCOMYCIN HCL 125 MG PO CAPS
125.0000 mg | ORAL_CAPSULE | Freq: Four times a day (QID) | ORAL | 0 refills | Status: AC
Start: 2023-10-25 — End: 2023-11-04

## 2023-10-27 DIAGNOSIS — G4733 Obstructive sleep apnea (adult) (pediatric): Secondary | ICD-10-CM | POA: Diagnosis not present

## 2023-10-29 LAB — CALPROTECTIN: Calprotectin: 59 ug/g

## 2023-10-29 LAB — FECAL LACTOFERRIN, QUANT: LACTOFERRIN, QL, STOOL: POSITIVE — AB

## 2023-11-12 ENCOUNTER — Other Ambulatory Visit: Payer: Self-pay | Admitting: Neurological Surgery

## 2023-11-15 ENCOUNTER — Ambulatory Visit: Payer: Medicare PPO | Admitting: Physician Assistant

## 2023-11-19 ENCOUNTER — Other Ambulatory Visit: Payer: Self-pay | Admitting: Internal Medicine

## 2023-11-20 NOTE — Progress Notes (Unsigned)
 Cardiology Office Note    Date:  11/22/2023  ID:  Kayla Khan, DOB 1951/03/16, MRN [id number] PCP:  Etta Grandchild, MD  Cardiologist:  Christell Constant, MD  Electrophysiologist:  None   Chief Complaint: f/u dyspnea/echo  History of Present Illness: .    Kayla Khan is a 73 y.o. 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 She was a prior patient of Dr. Myrtis Ser and Dr. Delton See and now Dr. Izora Ribas. She had a normal nuclear stress test in 2008. The patient felt symptomatically improved after starting L-citruline and L-arginine. In 02/2020 she was having some left upper arm pain and chest discomfort with exertion so coronary CTA was performed 03/2020 showing minimal nonobstructive disease with otherwise no significant findings. Regarding lipids, she's previously had intolerance to Crestor so has been managed on pravastatin. Echo pursued 08/2023 for murmur and dyspnea showed EF 60-65%, normal RV, mild MR. The patient had felt her dyspnea was due to lack of activity due to back pain. Dr. Izora Ribas routed this result to her back surgeon for risk stratification and did not feel any further CV testing was needed. He did suggest trial of diltiazem or verapamil if needed for HTN in the future.  She returns for follow-up today overall doing well.  She has had a tumultuous start to the year.  She had norovirus followed by C. difficile earlier this year.  This caused her back surgery to be postponed until April.  Fortunately she has been feeling much better.  She states last 2 weeks she is felt better than she has in a long time.  She denies any chest pain, new dyspnea, palpitations, or  syncope.  She has been tracking her blood pressure at home and brings in several readings which are primarily in the mid 130s to low 140s systolic.  She is very sensitive to medication in general.  Labwork independently reviewed: 09/2023 LDL 67, trig 97, CBC LFTs TSH ok, K 3.8, Cr 0.82  ROS: .    Please see the history of present illness.  All other systems are reviewed and otherwise negative.  Studies Reviewed: Marland Kitchen    EKG:  EKG is ordered today, personally reviewed, demonstrating  EKG Interpretation Date/Time:  Monday November 22 2023 09:03:28 EDT Ventricular Rate:  89 PR Interval:  146 QRS Duration:  98 QT Interval:  362 QTC Calculation: 440 R Axis:   82  Text Interpretation: Normal sinus rhythm Low voltage QRS Cannot rule out Anterior infarct (cited on or before 29-Jul-1999) Nonspecific ST and T wave abnormality When compared with ECG of 30-Jul-2023 09:35, No significant change was found Confirmed by Ronie Spies [phone]) on 11/22/2023 9:20:02 AM   CV Studies: Cardiac studies reviewed are outlined and summarized above. Otherwise please see EMR for full report.   Current Reported Medications:.    Current Meds  Medication Sig   ALPRAZolam (XANAX) 0.25 MG tablet TAKE 1 TABLET(0.25 MG) BY MOUTH TWICE DAILY AS NEEDED   aspirin EC 81 MG tablet Take 1 tablet (81 mg total) by mouth daily.   azelastine (ASTELIN) 0.1 % nasal spray Place 1 spray into both nostrils 2 (two) times daily as needed. Use in each nostril as directed   BENFOTIAMINE PO Take 300  mcg by mouth daily.   cholecalciferol (VITAMIN D3) 25 MCG (1000 UNIT) tablet Take 2,000 Units by mouth daily.   ciclopirox (LOPROX) 0.77 % cream Apply topically 2 (two) times daily. (Patient taking differently: Apply 1 Application topically 2 (two) times daily as needed (fungus).)   CITRULLINE PO Take 750 mg by mouth 2 (two) times daily.   clobetasol ointment (TEMOVATE) 0.05 % Apply 1 application topically 2 (two) times daily. (Patient taking  differently: Apply 1 application  topically 2 (two) times a week.)   Coenzyme Q10 (COQ10) 100 MG CAPS Take 100 mg by mouth daily.   conjugated estrogens (PREMARIN) vaginal cream Place 1 Applicatorful vaginally 2 (two) times a week.    cyanocobalamin (VITAMIN B12) 1000 MCG tablet Take 1,000 mcg by mouth daily.   desonide (DESOWEN) 0.05 % cream Apply topically 2 (two) times daily. (Patient taking differently: Apply 1 Application topically 2 (two) times daily as needed (itching).)   famotidine (PEPCID) 40 MG tablet TAKE 1 TABLET(40 MG) BY MOUTH DAILY   fish oil-omega-3 fatty acids 1000 MG capsule Take 3 g by mouth daily.   fluticasone (FLONASE) 50 MCG/ACT nasal spray Place 2 sprays into both nostrils daily. (Patient taking differently: Place 2 sprays into both nostrils as needed for allergies.)   L-Theanine 200 MG CAPS Take 200 mg by mouth daily.   meclizine (ANTIVERT) 25 MG tablet Take 25 mg by mouth 3 (three) times daily as needed for dizziness.   melatonin 5 MG TABS Take 5 mg by mouth at bedtime.   Multiple Vitamin (MULTIVITAMIN WITH MINERALS) TABS tablet Take 1 tablet by mouth daily.   pravastatin (PRAVACHOL) 40 MG tablet TAKE 1 TABLET(40 MG) BY MOUTH DAILY   solifenacin (VESICARE) 5 MG tablet TAKE 1 TABLET(5 MG) BY MOUTH DAILY   Suvorexant (BELSOMRA) 15 MG TABS TAKE 1 TABLET BY MOUTH AT BEDTIME AS NEEDED   telmisartan (MICARDIS) 80 MG tablet TAKE 1 TABLET(80 MG) BY MOUTH DAILY    Physical Exam:    VS:  BP (!) 148/80 (BP Location: Left Arm)   Pulse 89   Ht 5\' 4"  (1.626 m)   Wt 184 lb 9.6 oz (83.7 kg)   SpO2 95%   BMI 31.69 kg/m    Wt Readings from Last 3 Encounters:  11/22/23 184 lb 9.6 oz (83.7 kg)  10/19/23 185 lb 3.2 oz (84 kg)  10/18/23 185 lb (83.9 kg)    GEN: Well nourished, well developed in no acute distress NECK: No JVD; No carotid bruits CARDIAC: RRR, no murmurs, rubs, gallops RESPIRATORY:  Clear to auscultation without rales, wheezing or rhonchi  ABDOMEN: Soft,  non-tender, non-distended EXTREMITIES:  No edema; No acute deformity   Asessement and Plan:.    1. Mild CAD, microvascular angina - symptoms stable without any recent chest pain or progressive dyspnea. Continue ASA 81mg  daily, pravastatin 40mg  daily. She also takes L-theanine and L-citrulline with improvement in symptoms. Follow symptoms with titration of antihypertensive regimen.  2. Essential HTN -remains suboptimally controlled.  She is on telmisartan 80 mg daily which is the maximum dose of this medication.  She historically has been intolerant to beta-blocker therapy due to increasing chest pain.  She states she did not previously tolerate diuretics very well.  She has a history of edema with amlodipine.  Per Dr. Debby Bud previous recommendation, we will initiate diltiazem at 120 mg daily.  I am not certain that this will bring her blood pressure completely to goal, however, she has a history of  being very sensitive to medications so we will start at the lowest dose and see how she does.  She will send in a copy of her blood pressure and heart rate readings in 1 week for our review.  If she needs additional antihypertensive therapy in the future, we could visit the options of hydralazine or spironolactone since this tends to generate a less potent diuretic effect than thiazides.  3. HLD -lipids reviewed from January and are at goal.  Continue pravastatin.  She is also using co-Q10.  4. Preoperative cardiovascular examination - we were not asked to recomment on this, however, just wanted to mention that there are no new updates in her cardiac history that would prohibit moving forward with this in April.  We will continue to work on blood pressure in the interim as above.  She will notify for any new or concerning cardiac symptoms before surgery.  She indicates she was given instructions by her surgeon to hold her aspirin prior to surgery, okay from cardiac standpoint.  5. Mild MR - consider  repeat echo 3-5 yrs.    Disposition: F/u with me in 3-4 months for BP f/u and reassessment of response to diltiazem.  Signed, Laurann Montana, PA-C

## 2023-11-22 ENCOUNTER — Encounter: Payer: Self-pay | Admitting: Physician Assistant

## 2023-11-22 ENCOUNTER — Ambulatory Visit: Payer: Medicare PPO | Attending: Physician Assistant | Admitting: Physician Assistant

## 2023-11-22 VITALS — BP 148/80 | HR 89 | Ht 64.0 in | Wt 184.6 lb

## 2023-11-22 DIAGNOSIS — I340 Nonrheumatic mitral (valve) insufficiency: Secondary | ICD-10-CM

## 2023-11-22 DIAGNOSIS — I10 Essential (primary) hypertension: Secondary | ICD-10-CM

## 2023-11-22 DIAGNOSIS — I2510 Atherosclerotic heart disease of native coronary artery without angina pectoris: Secondary | ICD-10-CM | POA: Diagnosis not present

## 2023-11-22 DIAGNOSIS — Z01810 Encounter for preprocedural cardiovascular examination: Secondary | ICD-10-CM | POA: Diagnosis not present

## 2023-11-22 DIAGNOSIS — E785 Hyperlipidemia, unspecified: Secondary | ICD-10-CM | POA: Diagnosis not present

## 2023-11-22 DIAGNOSIS — I2089 Other forms of angina pectoris: Secondary | ICD-10-CM

## 2023-11-22 MED ORDER — DILTIAZEM HCL ER COATED BEADS 120 MG PO CP24: 120.0000 mg | ORAL_CAPSULE | Freq: Every day | ORAL | 11 refills | Status: DC

## 2023-11-22 NOTE — Patient Instructions (Signed)
 Medication Instructions:  Your physician has recommended you make the following change in your medication:   START Diltiazem 120 mg taking 1 daily  *If you need a refill on your cardiac medications before your next appointment, please call your pharmacy*   Lab Work: None ordered  If you have labs (blood work) drawn today and your tests are completely normal, you will receive your results only by: MyChart Message (if you have MyChart) OR A paper copy in the mail If you have any lab test that is abnormal or we need to change your treatment, we will call you to review the results.   Testing/Procedures: None ordered   Follow-Up: At [facility], you and your health needs are our priority.  As part of our continuing mission to provide you with exceptional heart care, we have created designated Provider Care Teams.  These Care Teams include your primary Cardiologist (physician) and Advanced Practice Providers (APPs -  Physician Assistants and Nurse Practitioners) who all work together to provide you with the care you need, when you need it.  We recommend signing up for the patient portal called "MyChart".  Sign up information is provided on this After Visit Summary.  MyChart is used to connect with patients for Virtual Visits (Telemedicine).  Patients are able to view lab/test results, encounter notes, upcoming appointments, etc.  Non-urgent messages can be sent to your provider as well.   To learn more about what you can do with MyChart, go to [email or web address].    Your next appointment:   3-4 month(s)  Provider:   Ronie Spies, PA-C         Other Instructions     1st Floor: - Lobby - Registration  - Pharmacy  - Lab - Cafe  2nd Floor: - PV Lab - Diagnostic Testing (echo, CT, nuclear med)  3rd Floor: - Vacant  4th Floor: - TCTS (cardiothoracic surgery) - AFib Clinic - Structural Heart Clinic - Vascular Surgery  - Vascular Ultrasound  5th Floor: -  [facility] (general and EP) - Clinical Pharmacy for coumadin, hypertension, lipid, weight-loss medications, and med management appointments    Valet parking services will be available as well.

## 2023-11-24 NOTE — Progress Notes (Signed)
 Surgical Instructions   Your procedure is scheduled on December 07, 2023. Report to [facility] Main Entrance "A" at 5:30 A.M., then check in with the Admitting office. Any questions or running late day of surgery: call [phone]  Questions prior to your surgery date: call [phone], Monday-Friday, 8am-4pm. If you experience any cold or flu symptoms such as cough, fever, chills, shortness of breath, etc. between now and your scheduled surgery, please notify us at the above number.     Remember:  Do not eat or drink  after midnight the night before your surgery     Take these medicines the morning of surgery with A SIP OF WATER  diltiazem (CARDIZEM CD)  famotidine (PEPCID  pravastatin (PRAVACHOL)  solifenacin (VESICARE)   May take these medicines IF NEEDED: ALPRAZolam (XANAX)  fluticasone (FLONASE)   One week prior to surgery, STOP taking any Aspirin (unless otherwise instructed by your surgeon) Aleve, Naproxen, Ibuprofen, Motrin, Advil, Goody's, BC's, all herbal medications, fish oil, and non-prescription vitamins. THIS INCLUDES YOU fish oil-omega-3 fatty acids.                      Do NOT Smoke (Tobacco/Vaping) for 24 hours prior to your procedure.  If you use a CPAP at night, you may bring your mask/headgear for your overnight stay.   You will be asked to remove any contacts, glasses, piercing's, hearing aid's, dentures/partials prior to surgery. Please bring cases for these items if needed.    Patients discharged the day of surgery will not be allowed to drive home, and someone needs to stay with them for 24 hours.  SURGICAL WAITING ROOM VISITATION Patients may have no more than 2 support people in the waiting area - these visitors may rotate.   Pre-op nurse will coordinate an appropriate time for 1 ADULT support person, who may not rotate, to accompany patient in pre-op.  Children under the age of 75 must have an adult with them who is not the patient and must remain in  the main waiting area with an adult.  If the patient needs to stay at the hospital during part of their recovery, the visitor guidelines for inpatient rooms apply.  Please refer to the [facility] website for the visitor guidelines for any additional information.   If you received a COVID test during your pre-op visit  it is requested that you wear a mask when out in public, stay away from anyone that may not be feeling well and notify your surgeon if you develop symptoms. If you have been in contact with anyone that has tested positive in the last 10 days please notify you surgeon.      Pre-operative 5 CHG Bathing Instructions   You can play a key role in reducing the risk of infection after surgery. Your skin needs to be as free of germs as possible. You can reduce the number of germs on your skin by washing with CHG (chlorhexidine gluconate) soap before surgery. CHG is an antiseptic soap that kills germs and continues to kill germs even after washing.   DO NOT use if you have an allergy to chlorhexidine/CHG or antibacterial soaps. If your skin becomes reddened or irritated, stop using the CHG and notify one of our RNs at [phone].   Please shower with the CHG soap starting 4 days before surgery using the following schedule:     Please keep in mind the following:  DO NOT shave, including legs and underarms,  starting the day of your first shower.   You may shave your face at any point before/day of surgery.  Place clean sheets on your bed the day you start using CHG soap. Use a clean washcloth (not used since being washed) for each shower. DO NOT sleep with pets once you start using the CHG.   CHG Shower Instructions:  Wash your face and private area with normal soap. If you choose to wash your hair, wash first with your normal shampoo.  After you use shampoo/soap, rinse your hair and body thoroughly to remove shampoo/soap residue.  Turn the water OFF and apply about 3  tablespoons (45 ml) of CHG soap to a CLEAN washcloth.  Apply CHG soap ONLY FROM YOUR NECK DOWN TO YOUR TOES (washing for 3-5 minutes)  DO NOT use CHG soap on face, private areas, open wounds, or sores.  Pay special attention to the area where your surgery is being performed.  If you are having back surgery, having someone wash your back for you may be helpful. Wait 2 minutes after CHG soap is applied, then you may rinse off the CHG soap.  Pat dry with a clean towel  Put on clean clothes/pajamas   If you choose to wear lotion, please use ONLY the CHG-compatible lotions that are listed below.  Additional instructions for the day of surgery: DO NOT APPLY any lotions, deodorants, cologne, or perfumes.   Do not bring valuables to the hospital. [facility] is not responsible for any belongings/valuables. Do not wear nail polish, gel polish, artificial nails, or any other type of covering on natural nails (fingers and toes) Do not wear jewelry or makeup Put on clean/comfortable clothes.  Please brush your teeth.  Ask your nurse before applying any prescription medications to the skin.     CHG Compatible Lotions   Aveeno Moisturizing lotion  Cetaphil Moisturizing Cream  Cetaphil Moisturizing Lotion  Clairol Herbal Essence Moisturizing Lotion, Dry Skin  Clairol Herbal Essence Moisturizing Lotion, Extra Dry Skin  Clairol Herbal Essence Moisturizing Lotion, Normal Skin  Curel Age Defying Therapeutic Moisturizing Lotion with Alpha Hydroxy  Curel Extreme Care Body Lotion  Curel Soothing Hands Moisturizing Hand Lotion  Curel Therapeutic Moisturizing Cream, Fragrance-Free  Curel Therapeutic Moisturizing Lotion, Fragrance-Free  Curel Therapeutic Moisturizing Lotion, Original Formula  Eucerin Daily Replenishing Lotion  Eucerin Dry Skin Therapy Plus Alpha Hydroxy Crme  Eucerin Dry Skin Therapy Plus Alpha Hydroxy Lotion  Eucerin Original Crme  Eucerin Original Lotion  Eucerin Plus Crme  Eucerin Plus Lotion  Eucerin TriLipid Replenishing Lotion  Keri Anti-Bacterial Hand Lotion  Keri Deep Conditioning Original Lotion Dry Skin Formula Softly Scented  Keri Deep Conditioning Original Lotion, Fragrance Free Sensitive Skin Formula  Keri Lotion Fast Absorbing Fragrance Free Sensitive Skin Formula  Keri Lotion Fast Absorbing Softly Scented Dry Skin Formula  Keri Original Lotion  Keri Skin Renewal Lotion Keri Silky Smooth Lotion  Keri Silky Smooth Sensitive Skin Lotion  Nivea Body Creamy Conditioning Oil  Nivea Body Extra Enriched Lotion  Nivea Body Original Lotion  Nivea Body Sheer Moisturizing Lotion Nivea Crme  Nivea Skin Firming Lotion  NutraDerm 30 Skin Lotion  NutraDerm Skin Lotion  NutraDerm Therapeutic Skin Cream  NutraDerm Therapeutic Skin Lotion  ProShield Protective Hand Cream  Provon moisturizing lotion  Please read over the following fact sheets that you were given.

## 2023-11-25 ENCOUNTER — Other Ambulatory Visit: Payer: Self-pay

## 2023-11-25 ENCOUNTER — Encounter (HOSPITAL_COMMUNITY): Payer: Self-pay

## 2023-11-25 ENCOUNTER — Encounter (HOSPITAL_COMMUNITY)
Admission: RE | Admit: 2023-11-25 | Discharge: 2023-11-25 | Disposition: A | Discharge status: Home / Self Care | Source: Ambulatory Visit | Attending: Neurological Surgery | Admitting: Neurological Surgery

## 2023-11-25 VITALS — BP 157/63 | HR 81 | Temp 98.0°F | Resp 18 | Ht 64.0 in | Wt 185.0 lb

## 2023-11-25 DIAGNOSIS — R7303 Prediabetes: Secondary | ICD-10-CM | POA: Diagnosis not present

## 2023-11-25 DIAGNOSIS — Z01812 Encounter for preprocedural laboratory examination: Secondary | ICD-10-CM | POA: Diagnosis not present

## 2023-11-25 DIAGNOSIS — Z79899 Other long term (current) drug therapy: Secondary | ICD-10-CM | POA: Insufficient documentation

## 2023-11-25 DIAGNOSIS — I340 Nonrheumatic mitral (valve) insufficiency: Secondary | ICD-10-CM | POA: Diagnosis not present

## 2023-11-25 DIAGNOSIS — I2510 Atherosclerotic heart disease of native coronary artery without angina pectoris: Secondary | ICD-10-CM | POA: Diagnosis not present

## 2023-11-25 DIAGNOSIS — G4733 Obstructive sleep apnea (adult) (pediatric): Secondary | ICD-10-CM | POA: Diagnosis not present

## 2023-11-25 DIAGNOSIS — I10 Essential (primary) hypertension: Secondary | ICD-10-CM | POA: Diagnosis not present

## 2023-11-25 DIAGNOSIS — Z01818 Encounter for other preprocedural examination: Secondary | ICD-10-CM

## 2023-11-25 LAB — BASIC METABOLIC PANEL
Anion gap: 7 (ref 5–15)
BUN: 25 mg/dL — ABNORMAL HIGH (ref 8–23)
CO2: 25 mmol/L (ref 22–32)
Calcium: 9.5 mg/dL (ref 8.9–10.3)
Chloride: 109 mmol/L (ref 98–111)
Creatinine, Ser: 0.88 mg/dL (ref 0.44–1.00)
GFR, Estimated: >60 mL/min (ref >60)
Glucose, Bld: 149 mg/dL — ABNORMAL HIGH (ref 70–99)
Potassium: 4.1 mmol/L (ref 3.5–5.1)
Sodium: 141 mmol/L (ref 135–145)

## 2023-11-25 LAB — HEMOGLOBIN A1C
Hgb A1c MFr Bld: 5.7 % — ABNORMAL HIGH (ref 4.8–5.6)
Mean Plasma Glucose: 116.89 mg/dL

## 2023-11-25 LAB — SURGICAL PCR SCREEN
MRSA, PCR: NEGATIVE
Staphylococcus aureus: NEGATIVE

## 2023-11-25 LAB — CBC
HCT: 39.5 % (ref 36.0–46.0)
Hemoglobin: 12.9 g/dL (ref 12.0–15.0)
MCH: 31.5 pg (ref 26.0–34.0)
MCHC: 32.7 g/dL (ref 30.0–36.0)
MCV: 96.3 fL (ref 80.0–100.0)
Platelets: 247 10*3/uL (ref 150–400)
RBC: 4.1 MIL/uL (ref 3.87–5.11)
RDW: 12.5 % (ref 11.5–15.5)
WBC: 6.2 10*3/uL (ref 4.0–10.5)
nRBC: 0 % (ref 0.0–0.2)

## 2023-11-25 NOTE — Progress Notes (Signed)
 PCP - Dr. Sanda Linger Cardiologist - Sherwood Gambler  PPM/ICD - Denies Device Orders - n/a Rep Notified - n/a  Chest x-ray - n/a EKG - 11-22-23 Stress Test - 08-08-2007 ECHO - 08-31-2023 Cardiac Cath - 01-20-2011  Sleep Study - Yes, sleep test 12-08-17 CPAP - wear CPAP on max setting of 15  NON-diabetic  Last dose of GLP1 agonist-  Denies GLP1 instructions: n/a  Blood Thinner Instructions: Denies Aspirin Instructions: Patient states last dose will be on 11-29-23.  ERAS Protcol - NPO PRE-SURGERY Ensure or G2- none  COVID TEST- no   Anesthesia review: Yes, Pre-DM, HTN, OSA w/cpap, hx c-diff in feb 2025.  Patient denies shortness of breath, fever, cough and chest pain at PAT appointment. Patient denies any respiratory issues at this time.    All instructions explained to the patient, with a verbal understanding of the material. Patient agrees to go over the instructions while at home for a better understanding. Patient also instructed to self quarantine after being tested for COVID-19. The opportunity to ask questions was provided.

## 2023-11-25 NOTE — Progress Notes (Signed)
 Surgical Instructions     Your procedure is scheduled on December 07, 2023. Report to [facility] Main Entrance "A" at 5:30 A.M., then check in with the Admitting office. Any questions or running late day of surgery: call [phone]   Questions prior to your surgery date: call [phone], Monday-Friday, 8am-4pm. If you experience any cold or flu symptoms such as cough, fever, chills, shortness of breath, etc. between now and your scheduled surgery, please notify us at the above number.            Remember:       Do not eat or drink  after midnight the night before your surgery            Take these medicines the morning of surgery with A SIP OF WATER  diltiazem (CARDIZEM CD)  famotidine (PEPCID  pravastatin (PRAVACHOL)  solifenacin (VESICARE)    May take these medicines IF NEEDED: ALPRAZolam (XANAX)  fluticasone (FLONASE)   Per your physician's instruction's, HOLD your ASPIRIN for 7 day's prior to surgery.  Last dose should be taken on or before 11-29-23.   One week prior to surgery, STOP taking any Aleve, Naproxen, Ibuprofen, Motrin, Advil, Goody's, BC's, all herbal medications, fish oil, and non-prescription vitamins. THIS INCLUDES YOU fish oil-omega-3 fatty acids.                      Do NOT Smoke (Tobacco/Vaping) for 24 hours prior to your procedure.   If you use a CPAP at night, you may bring your mask/headgear for your overnight stay.   You will be asked to remove any contacts, glasses, piercing's, hearing aid's, dentures/partials prior to surgery. Please bring cases for these items if needed.    Patients discharged the day of surgery will not be allowed to drive home, and someone needs to stay with them for 24 hours.   SURGICAL WAITING ROOM VISITATION Patients may have no more than 2 support people in the waiting area - these visitors may rotate.   Pre-op nurse will coordinate an appropriate time for 1 ADULT support person, who may not rotate, to accompany patient in  pre-op.  Children under the age of 28 must have an adult with them who is not the patient and must remain in the main waiting area with an adult.   If the patient needs to stay at the hospital during part of their recovery, the visitor guidelines for inpatient rooms apply.   Please refer to the [facility] website for the visitor guidelines for any additional information.     If you received a COVID test during your pre-op visit  it is requested that you wear a mask when out in public, stay away from anyone that may not be feeling well and notify your surgeon if you develop symptoms. If you have been in contact with anyone that has tested positive in the last 10 days please notify you surgeon.         Pre-operative 5 CHG Bathing Instructions    You can play a key role in reducing the risk of infection after surgery. Your skin needs to be as free of germs as possible. You can reduce the number of germs on your skin by washing with CHG (chlorhexidine gluconate) soap before surgery. CHG is an antiseptic soap that kills germs and continues to kill germs even after washing.    DO NOT use if you have an allergy to chlorhexidine/CHG or antibacterial soaps. If your  skin becomes reddened or irritated, stop using the CHG and notify one of our RNs at [phone].    Please shower with the CHG soap starting 4 days before surgery using the following schedule:       Please keep in mind the following:  DO NOT shave, including legs and underarms, starting the day of your first shower.   You may shave your face at any point before/day of surgery.  Place clean sheets on your bed the day you start using CHG soap. Use a clean washcloth (not used since being washed) for each shower. DO NOT sleep with pets once you start using the CHG.    CHG Shower Instructions:  Wash your face and private area with normal soap. If you choose to wash your hair, wash first with your normal shampoo.  After you use  shampoo/soap, rinse your hair and body thoroughly to remove shampoo/soap residue.  Turn the water OFF and apply about 3 tablespoons (45 ml) of CHG soap to a CLEAN washcloth.  Apply CHG soap ONLY FROM YOUR NECK DOWN TO YOUR TOES (washing for 3-5 minutes)  DO NOT use CHG soap on face, private areas, open wounds, or sores.  Pay special attention to the area where your surgery is being performed.  If you are having back surgery, having someone wash your back for you may be helpful. Wait 2 minutes after CHG soap is applied, then you may rinse off the CHG soap.  Pat dry with a clean towel  Put on clean clothes/pajamas   If you choose to wear lotion, please use ONLY the CHG-compatible lotions that are listed below.   Additional instructions for the day of surgery: DO NOT APPLY any lotions, deodorants, cologne, or perfumes.   Do not bring valuables to the hospital. [facility] is not responsible for any belongings/valuables. Do not wear nail polish, gel polish, artificial nails, or any other type of covering on natural nails (fingers and toes) Do not wear jewelry or makeup Put on clean/comfortable clothes.  Please brush your teeth.  Ask your nurse before applying any prescription medications to the skin.        CHG Compatible Lotions    Aveeno Moisturizing lotion  Cetaphil Moisturizing Cream  Cetaphil Moisturizing Lotion  Clairol Herbal Essence Moisturizing Lotion, Dry Skin  Clairol Herbal Essence Moisturizing Lotion, Extra Dry Skin  Clairol Herbal Essence Moisturizing Lotion, Normal Skin  Curel Age Defying Therapeutic Moisturizing Lotion with Alpha Hydroxy  Curel Extreme Care Body Lotion  Curel Soothing Hands Moisturizing Hand Lotion  Curel Therapeutic Moisturizing Cream, Fragrance-Free  Curel Therapeutic Moisturizing Lotion, Fragrance-Free  Curel Therapeutic Moisturizing Lotion, Original Formula  Eucerin Daily Replenishing Lotion  Eucerin Dry Skin Therapy Plus Alpha Hydroxy Crme   Eucerin Dry Skin Therapy Plus Alpha Hydroxy Lotion  Eucerin Original Crme  Eucerin Original Lotion  Eucerin Plus Crme Eucerin Plus Lotion  Eucerin TriLipid Replenishing Lotion  Keri Anti-Bacterial Hand Lotion  Keri Deep Conditioning Original Lotion Dry Skin Formula Softly Scented  Keri Deep Conditioning Original Lotion, Fragrance Free Sensitive Skin Formula  Keri Lotion Fast Absorbing Fragrance Free Sensitive Skin Formula  Keri Lotion Fast Absorbing Softly Scented Dry Skin Formula  Keri Original Lotion  Keri Skin Renewal Lotion Keri Silky Smooth Lotion  Keri Silky Smooth Sensitive Skin Lotion  Nivea Body Creamy Conditioning Oil  Nivea Body Extra Enriched Teacher, adult education Moisturizing Lotion Nivea Crme  Nivea Skin Firming Lotion  NutraDerm 30  Skin Lotion  NutraDerm Skin Lotion  NutraDerm Therapeutic Skin Cream  NutraDerm Therapeutic Skin Lotion  ProShield Protective Hand Cream  Provon moisturizing lotion   Please read over the following fact sheets that you were given.

## 2023-11-30 NOTE — Anesthesia Preprocedure Evaluation (Addendum)
 Anesthesia Evaluation  Patient identified by MRN, date of birth, ID band Patient awake    Reviewed: Allergy & Precautions, NPO status , Patient's Chart, lab work & pertinent test results  History of Anesthesia Complications (+) PONV and history of anesthetic complications  Airway Mallampati: II  TM Distance: >3 FB Neck ROM: Full    Dental  (+) Dental Advisory Given, Teeth Intact   Pulmonary asthma , sleep apnea and Continuous Positive Airway Pressure Ventilation    Pulmonary exam normal        Cardiovascular hypertension, Pt. on medications + CAD  Normal cardiovascular exam   '24 TTE - EF 60 to 65%. Mild MR    Neuro/Psych  PSYCHIATRIC DISORDERS Anxiety Depression    negative neurological ROS     GI/Hepatic Neg liver ROS,GERD  Medicated and Controlled,,  Endo/Other   Pre-DM Obesity   Renal/GU CRFRenal disease Bladder dysfunction      Musculoskeletal  (+) Arthritis ,    Abdominal   Peds  Hematology negative hematology ROS (+)   Anesthesia Other Findings   Reproductive/Obstetrics                             Anesthesia Physical Anesthesia Plan  ASA: 3  Anesthesia Plan: General   Post-op Pain Management: Tylenol PO (pre-op)*   Induction: Intravenous  PONV Risk Score and Plan: 4 or greater and Treatment may vary due to age or medical condition, Ondansetron, Propofol infusion and Dexamethasone  Airway Management Planned: Oral ETT  Additional Equipment: None  Intra-op Plan:   Post-operative Plan: Extubation in OR  Informed Consent: I have reviewed the patients History and Physical, chart, labs and discussed the procedure including the risks, benefits and alternatives for the proposed anesthesia with the patient or authorized representative who has indicated his/her understanding and acceptance.     Dental advisory given  Plan Discussed with: CRNA and  Anesthesiologist  Anesthesia Plan Comments: (See PAT note)        Anesthesia Quick Evaluation

## 2023-11-30 NOTE — Progress Notes (Signed)
 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  Echo 08/2023 showed EF 60 to 65%, normal RV, mild MR.  She was seen by Ronie Spies, PA-C on 11/22/2023 and noted to be doing well at that time, however, hypertension remains suboptimally controlled.  She is on max dose of telmisartan 80 mg daily, previously could not tolerate beta-blockers, and has a history of edema with amlodipine.  She was initiated on diltiazem 120 mg daily.  Upcoming surgery was also discussed, "Preoperative cardiovascular examination - we were not asked to recomment on this, however, just wanted to mention that there are no new updates in her cardiac history that would prohibit moving forward with this in April.  We will continue to work on blood pressure in the interim as above.  She will notify for any new or concerning cardiac symptoms before surgery.  She indicates she was given instructions by her surgeon to hold her aspirin prior to surgery, okay from cardiac standpoint."  Other pertinent history includes anemia, PONV, asthma, depression with anxiety, GERD on H2 blocker.  Preop labs reviewed, unremarkable.  EKG 11/22/2023: Normal sinus rhythm. Rate 89. Low voltage QRS. Cannot rule out Anterior infarct (cited on or before 29-Jul-1999). Nonspecific ST and T wave abnormality  TTE 08/31/2023: 1. Left ventricular ejection fraction, by estimation, is 60 to 65%. The  left ventricle has normal function. The left ventricle has no regional  wall motion abnormalities. Left ventricular diastolic parameters were  normal. The average left ventricular  global longitudinal strain is -22.3 %. The global longitudinal strain is  normal.   2. Right ventricular systolic function is normal. The right ventricular  size is normal.   3. The mitral valve is normal in structure. Mild  mitral valve  regurgitation. No evidence of mitral stenosis.   4. The aortic valve is tricuspid. Aortic valve regurgitation is not  visualized. No aortic stenosis is present.   5. The inferior vena cava is normal in size with greater than 50%  respiratory variability, suggesting right atrial pressure of 3 mmHg.   Coronary CTA 03/25/2020: IMPRESSION: 1. Coronary calcium score of 6.2. This was 72 percentile for age and sex matched control.   2. Normal coronary origin with right dominance.   3. CAD-RADS 1. Minimal non-obstructive CAD (0-24%). Consider non-atherosclerotic causes of chest pain. Consider preventive therapy and risk factor modification.    Zannie Cove [facility] Short Stay Center/Anesthesiology Phone [phone] 11/30/2023 4:06 PM

## 2023-12-07 ENCOUNTER — Ambulatory Visit (HOSPITAL_COMMUNITY): Payer: Self-pay | Admitting: Anesthesiology

## 2023-12-07 ENCOUNTER — Ambulatory Visit (HOSPITAL_COMMUNITY)
Admission: RE | Disposition: A | Discharge status: Home / Self Care | Payer: Self-pay | Source: Home / Self Care | Attending: Neurological Surgery

## 2023-12-07 ENCOUNTER — Ambulatory Visit (HOSPITAL_COMMUNITY)

## 2023-12-07 ENCOUNTER — Encounter (HOSPITAL_COMMUNITY): Payer: Self-pay | Admitting: Neurological Surgery

## 2023-12-07 ENCOUNTER — Observation Stay (HOSPITAL_COMMUNITY)
Admission: RE | Admit: 2023-12-07 | Discharge: 2023-12-08 | Disposition: A | Discharge status: Home / Self Care | Attending: Neurological Surgery | Admitting: Neurological Surgery

## 2023-12-07 ENCOUNTER — Ambulatory Visit (HOSPITAL_COMMUNITY): Payer: Self-pay | Admitting: Physician Assistant

## 2023-12-07 ENCOUNTER — Other Ambulatory Visit: Payer: Self-pay

## 2023-12-07 DIAGNOSIS — Z91040 Latex allergy status: Secondary | ICD-10-CM | POA: Diagnosis not present

## 2023-12-07 DIAGNOSIS — I10 Essential (primary) hypertension: Secondary | ICD-10-CM | POA: Insufficient documentation

## 2023-12-07 DIAGNOSIS — I700 Atherosclerosis of aorta: Secondary | ICD-10-CM

## 2023-12-07 DIAGNOSIS — Z79899 Other long term (current) drug therapy: Secondary | ICD-10-CM | POA: Insufficient documentation

## 2023-12-07 DIAGNOSIS — M48062 Spinal stenosis, lumbar region with neurogenic claudication: Principal | ICD-10-CM | POA: Insufficient documentation

## 2023-12-07 DIAGNOSIS — J45909 Unspecified asthma, uncomplicated: Secondary | ICD-10-CM

## 2023-12-07 DIAGNOSIS — Z7982 Long term (current) use of aspirin: Secondary | ICD-10-CM | POA: Diagnosis not present

## 2023-12-07 DIAGNOSIS — I2510 Atherosclerotic heart disease of native coronary artery without angina pectoris: Secondary | ICD-10-CM | POA: Diagnosis not present

## 2023-12-07 DIAGNOSIS — Z7722 Contact with and (suspected) exposure to environmental tobacco smoke (acute) (chronic): Secondary | ICD-10-CM | POA: Insufficient documentation

## 2023-12-07 DIAGNOSIS — Z981 Arthrodesis status: Secondary | ICD-10-CM | POA: Diagnosis not present

## 2023-12-07 DIAGNOSIS — M48061 Spinal stenosis, lumbar region without neurogenic claudication: Principal | ICD-10-CM | POA: Diagnosis present

## 2023-12-07 HISTORY — PX: FORAMINOTOMY 1 LEVEL: SHX5835

## 2023-12-07 SURGERY — FORAMINOTOMY 1 LEVEL
Anesthesia: General

## 2023-12-07 MED ORDER — OXYCODONE HCL 5 MG/5ML PO SOLN: 5.0000 mg | Freq: Once | ORAL | Status: DC | PRN

## 2023-12-07 MED ORDER — PROPOFOL 500 MG/50ML IV EMUL
INTRAVENOUS | Status: DC | PRN
  Administered 2023-12-07: 25 ug/kg/min via INTRAVENOUS

## 2023-12-07 MED ORDER — METHOCARBAMOL 500 MG PO TABS
500.0000 mg | ORAL_TABLET | Freq: Four times a day (QID) | ORAL | Status: DC | PRN
  Administered 2023-12-07 – 2023-12-08 (×4): 500 mg via ORAL
  Filled 2023-12-07 (×4): qty 1

## 2023-12-07 MED ORDER — CHLORHEXIDINE GLUCONATE CLOTH 2 % EX PADS: 6.0000 | MEDICATED_PAD | Freq: Once | CUTANEOUS | Status: DC

## 2023-12-07 MED ORDER — VANCOMYCIN HCL 1000 MG IV SOLR
INTRAVENOUS | Status: DC | PRN
  Administered 2023-12-07: 1000 mg via INTRAVENOUS

## 2023-12-07 MED ORDER — FENTANYL CITRATE (PF) 100 MCG/2ML IJ SOLN: 25.0000 ug | INTRAMUSCULAR | Status: DC | PRN

## 2023-12-07 MED ORDER — ONDANSETRON HCL 4 MG PO TABS: 4.0000 mg | ORAL_TABLET | Freq: Four times a day (QID) | ORAL | Status: DC | PRN

## 2023-12-07 MED ORDER — SUVOREXANT 15 MG PO TABS: 1.0000 | ORAL_TABLET | Freq: Every evening | ORAL | Status: DC | PRN

## 2023-12-07 MED ORDER — MICROFIBRILLAR COLL HEMOSTAT EX POWD
CUTANEOUS | Status: AC
  Filled 2023-12-07: qty 1

## 2023-12-07 MED ORDER — OXYCODONE HCL 5 MG PO TABS: 10.0000 mg | ORAL_TABLET | ORAL | Status: DC | PRN

## 2023-12-07 MED ORDER — FENTANYL CITRATE (PF) 250 MCG/5ML IJ SOLN
INTRAMUSCULAR | Status: DC | PRN
  Administered 2023-12-07 (×2): 50 ug via INTRAVENOUS

## 2023-12-07 MED ORDER — FLEET ENEMA RE ENEM: 1.0000 | ENEMA | Freq: Once | RECTAL | Status: DC | PRN

## 2023-12-07 MED ORDER — BUPIVACAINE-EPINEPHRINE (PF) 0.5% -1:200000 IJ SOLN
INTRAMUSCULAR | Status: DC | PRN
  Administered 2023-12-07: 5 mL via PERINEURAL

## 2023-12-07 MED ORDER — THROMBIN 5000 UNITS EX SOLR
OROMUCOSAL | Status: DC | PRN
  Administered 2023-12-07: 5 mL via TOPICAL

## 2023-12-07 MED ORDER — HYDROMORPHONE HCL 1 MG/ML IJ SOLN: 0.5000 mg | INTRAMUSCULAR | Status: DC | PRN

## 2023-12-07 MED ORDER — ONDANSETRON HCL 4 MG/2ML IJ SOLN
4.0000 mg | Freq: Once | INTRAMUSCULAR | Status: AC | PRN
  Administered 2023-12-07: 4 mg via INTRAVENOUS

## 2023-12-07 MED ORDER — 0.9 % SODIUM CHLORIDE (POUR BTL) OPTIME
TOPICAL | Status: DC | PRN
  Administered 2023-12-07: 1000 mL

## 2023-12-07 MED ORDER — PRAVASTATIN SODIUM 40 MG PO TABS
40.0000 mg | ORAL_TABLET | Freq: Every day | ORAL | Status: DC
  Administered 2023-12-07: 40 mg via ORAL
  Filled 2023-12-07 (×2): qty 1

## 2023-12-07 MED ORDER — BUPIVACAINE LIPOSOME 1.3 % IJ SUSP
INTRAMUSCULAR | Status: AC
  Filled 2023-12-07: qty 20

## 2023-12-07 MED ORDER — LIDOCAINE-EPINEPHRINE 1 %-1:100000 IJ SOLN
INTRAMUSCULAR | Status: AC
  Filled 2023-12-07: qty 1

## 2023-12-07 MED ORDER — MELATONIN 5 MG PO TABS
5.0000 mg | ORAL_TABLET | Freq: Every day | ORAL | Status: DC
  Administered 2023-12-07: 5 mg via ORAL
  Filled 2023-12-07: qty 1

## 2023-12-07 MED ORDER — FAMOTIDINE 20 MG PO TABS
40.0000 mg | ORAL_TABLET | Freq: Every day | ORAL | Status: DC
  Filled 2023-12-07: qty 2

## 2023-12-07 MED ORDER — PHENYLEPHRINE HCL-NACL 20-0.9 MG/250ML-% IV SOLN
INTRAVENOUS | Status: DC | PRN
  Administered 2023-12-07: 25 ug/min via INTRAVENOUS

## 2023-12-07 MED ORDER — PROPOFOL 10 MG/ML IV BOLUS
INTRAVENOUS | Status: AC
Start: 2023-12-07 — End: ?
  Filled 2023-12-07: qty 20

## 2023-12-07 MED ORDER — ACETAMINOPHEN 650 MG RE SUPP: 650.0000 mg | RECTAL | Status: DC | PRN

## 2023-12-07 MED ORDER — OXYCODONE HCL 5 MG PO TABS: 5.0000 mg | ORAL_TABLET | Freq: Once | ORAL | Status: DC | PRN

## 2023-12-07 MED ORDER — POLYETHYLENE GLYCOL 3350 17 G PO PACK: 17.0000 g | PACK | Freq: Every day | ORAL | Status: DC | PRN

## 2023-12-07 MED ORDER — METHOCARBAMOL 1000 MG/10ML IJ SOLN: 500.0000 mg | Freq: Four times a day (QID) | INTRAMUSCULAR | Status: DC | PRN

## 2023-12-07 MED ORDER — BUPIVACAINE LIPOSOME 1.3 % IJ SUSP
INTRAMUSCULAR | Status: DC | PRN
  Administered 2023-12-07: 20 mL

## 2023-12-07 MED ORDER — LIDOCAINE-EPINEPHRINE 1 %-1:100000 IJ SOLN
INTRAMUSCULAR | Status: DC | PRN
  Administered 2023-12-07: 5 mL

## 2023-12-07 MED ORDER — SODIUM CHLORIDE 0.9% FLUSH
3.0000 mL | Freq: Two times a day (BID) | INTRAVENOUS | Status: DC
  Administered 2023-12-07: 3 mL via INTRAVENOUS

## 2023-12-07 MED ORDER — BUPIVACAINE-EPINEPHRINE (PF) 0.5% -1:200000 IJ SOLN
INTRAMUSCULAR | Status: AC
  Filled 2023-12-07: qty 30

## 2023-12-07 MED ORDER — THROMBIN 5000 UNITS EX KIT
PACK | CUTANEOUS | Status: AC
  Filled 2023-12-07: qty 3

## 2023-12-07 MED ORDER — SODIUM CHLORIDE 0.9% FLUSH: 3.0000 mL | Freq: Two times a day (BID) | INTRAVENOUS | Status: DC

## 2023-12-07 MED ORDER — SUGAMMADEX SODIUM 200 MG/2ML IV SOLN
INTRAVENOUS | Status: DC | PRN
  Administered 2023-12-07: 200 mg via INTRAVENOUS

## 2023-12-07 MED ORDER — PROPOFOL 10 MG/ML IV BOLUS
INTRAVENOUS | Status: DC | PRN
  Administered 2023-12-07: 150 mg via INTRAVENOUS

## 2023-12-07 MED ORDER — METHYLPREDNISOLONE ACETATE 80 MG/ML IJ SUSP
INTRAMUSCULAR | Status: DC | PRN
  Administered 2023-12-07: 40 mg

## 2023-12-07 MED ORDER — MECLIZINE HCL 25 MG PO TABS
25.0000 mg | ORAL_TABLET | Freq: Three times a day (TID) | ORAL | Status: DC | PRN
Start: 2023-12-07 — End: 2023-12-07

## 2023-12-07 MED ORDER — LACTATED RINGERS IV SOLN: INTRAVENOUS | Status: DC | PRN

## 2023-12-07 MED ORDER — HYDROMORPHONE HCL 1 MG/ML IJ SOLN
INTRAMUSCULAR | Status: DC | PRN
  Administered 2023-12-07: .5 mg via INTRAVENOUS

## 2023-12-07 MED ORDER — FENTANYL CITRATE (PF) 250 MCG/5ML IJ SOLN
INTRAMUSCULAR | Status: AC
  Filled 2023-12-07: qty 5

## 2023-12-07 MED ORDER — ONDANSETRON HCL 4 MG/2ML IJ SOLN
INTRAMUSCULAR | Status: AC
  Filled 2023-12-07: qty 2

## 2023-12-07 MED ORDER — VANCOMYCIN HCL IN DEXTROSE 1-5 GM/200ML-% IV SOLN
1000.0000 mg | INTRAVENOUS | Status: AC
  Administered 2023-12-07: 1000 mg via INTRAVENOUS
  Filled 2023-12-07: qty 200

## 2023-12-07 MED ORDER — FENTANYL CITRATE (PF) 100 MCG/2ML IJ SOLN
INTRAMUSCULAR | Status: AC
Start: 2023-12-07 — End: ?
  Filled 2023-12-07: qty 2

## 2023-12-07 MED ORDER — FESOTERODINE FUMARATE ER 4 MG PO TB24
4.0000 mg | ORAL_TABLET | Freq: Every day | ORAL | Status: DC
  Filled 2023-12-07 (×2): qty 1

## 2023-12-07 MED ORDER — ORAL CARE MOUTH RINSE: 15.0000 mL | Freq: Once | OROMUCOSAL | Status: AC

## 2023-12-07 MED ORDER — ACETAMINOPHEN 500 MG PO TABS
1000.0000 mg | ORAL_TABLET | Freq: Once | ORAL | Status: AC
  Administered 2023-12-07: 1000 mg via ORAL
  Filled 2023-12-07: qty 2

## 2023-12-07 MED ORDER — KETOROLAC TROMETHAMINE 15 MG/ML IJ SOLN
7.5000 mg | Freq: Four times a day (QID) | INTRAMUSCULAR | Status: DC
  Administered 2023-12-07 (×2): 7.5 mg via INTRAVENOUS
  Filled 2023-12-07 (×2): qty 1

## 2023-12-07 MED ORDER — THROMBIN (RECOMBINANT) 5000 UNITS EX SOLR
CUTANEOUS | Status: DC | PRN
  Administered 2023-12-07: 10 mL via TOPICAL

## 2023-12-07 MED ORDER — VASOPRESSIN 20 UNIT/ML IV SOLN
INTRAVENOUS | Status: AC
  Filled 2023-12-07: qty 1

## 2023-12-07 MED ORDER — HYDROCODONE-ACETAMINOPHEN 5-325 MG PO TABS: 1.0000 | ORAL_TABLET | ORAL | Status: DC | PRN

## 2023-12-07 MED ORDER — SODIUM CHLORIDE 0.9 % IV SOLN
250.0000 mL | INTRAVENOUS | Status: AC
  Administered 2023-12-07: 250 mL via INTRAVENOUS

## 2023-12-07 MED ORDER — METHYLPREDNISOLONE ACETATE 80 MG/ML IJ SUSP
INTRAMUSCULAR | Status: AC
  Filled 2023-12-07: qty 1

## 2023-12-07 MED ORDER — ONDANSETRON HCL 4 MG/2ML IJ SOLN: 4.0000 mg | Freq: Four times a day (QID) | INTRAMUSCULAR | Status: DC | PRN

## 2023-12-07 MED ORDER — OXYCODONE HCL 5 MG PO TABS: 5.0000 mg | ORAL_TABLET | ORAL | Status: DC | PRN

## 2023-12-07 MED ORDER — ACETAMINOPHEN 500 MG PO TABS
1000.0000 mg | ORAL_TABLET | Freq: Four times a day (QID) | ORAL | Status: DC
  Administered 2023-12-07 – 2023-12-08 (×4): 1000 mg via ORAL
  Filled 2023-12-07 (×4): qty 2

## 2023-12-07 MED ORDER — DOCUSATE SODIUM 100 MG PO CAPS
100.0000 mg | ORAL_CAPSULE | Freq: Two times a day (BID) | ORAL | Status: DC
  Administered 2023-12-07: 100 mg via ORAL
  Filled 2023-12-07: qty 1

## 2023-12-07 MED ORDER — MICROFIBRILLAR COLL HEMOSTAT EX POWD
CUTANEOUS | Status: DC | PRN
  Administered 2023-12-07: 1 g via TOPICAL

## 2023-12-07 MED ORDER — DEXAMETHASONE SODIUM PHOSPHATE 10 MG/ML IJ SOLN
INTRAMUSCULAR | Status: DC | PRN
  Administered 2023-12-07: 10 mg via INTRAVENOUS

## 2023-12-07 MED ORDER — VANCOMYCIN HCL IN DEXTROSE 1-5 GM/200ML-% IV SOLN
1000.0000 mg | Freq: Once | INTRAVENOUS | Status: AC
  Administered 2023-12-07: 1000 mg via INTRAVENOUS
  Filled 2023-12-07: qty 200

## 2023-12-07 MED ORDER — SODIUM CHLORIDE 0.9% FLUSH: 3.0000 mL | INTRAVENOUS | Status: DC | PRN

## 2023-12-07 MED ORDER — CHLORHEXIDINE GLUCONATE 0.12 % MT SOLN
15.0000 mL | Freq: Once | OROMUCOSAL | Status: AC
  Administered 2023-12-07: 15 mL via OROMUCOSAL
  Filled 2023-12-07: qty 15

## 2023-12-07 MED ORDER — ROCURONIUM BROMIDE 10 MG/ML (PF) SYRINGE
PREFILLED_SYRINGE | INTRAVENOUS | Status: DC | PRN
  Administered 2023-12-07: 60 mg via INTRAVENOUS

## 2023-12-07 MED ORDER — HYDROMORPHONE HCL 1 MG/ML IJ SOLN
INTRAMUSCULAR | Status: AC
  Filled 2023-12-07: qty 0.5

## 2023-12-07 MED ORDER — LIDOCAINE 2% (20 MG/ML) 5 ML SYRINGE
INTRAMUSCULAR | Status: DC | PRN
  Administered 2023-12-07: 60 mg via INTRAVENOUS

## 2023-12-07 MED ORDER — DILTIAZEM HCL ER COATED BEADS 120 MG PO CP24
120.0000 mg | ORAL_CAPSULE | Freq: Every day | ORAL | Status: DC
Start: 2023-12-08 — End: 2023-12-08
  Administered 2023-12-08: 120 mg via ORAL
  Filled 2023-12-07: qty 1

## 2023-12-07 MED ORDER — MENTHOL 3 MG MT LOZG: 1.0000 | LOZENGE | OROMUCOSAL | Status: DC | PRN

## 2023-12-07 MED ORDER — FENTANYL CITRATE (PF) 100 MCG/2ML IJ SOLN
INTRAMUSCULAR | Status: DC | PRN
  Administered 2023-12-07: 50 ug via INTRAVENOUS

## 2023-12-07 MED ORDER — ACETAMINOPHEN 325 MG PO TABS: 650.0000 mg | ORAL_TABLET | ORAL | Status: DC | PRN

## 2023-12-07 MED ORDER — ONDANSETRON HCL 4 MG/2ML IJ SOLN
INTRAMUSCULAR | Status: DC | PRN
Start: 2023-12-07 — End: 2023-12-07
  Administered 2023-12-07: 4 mg via INTRAVENOUS

## 2023-12-07 MED ORDER — ALPRAZOLAM 0.25 MG PO TABS: 0.2500 mg | ORAL_TABLET | Freq: Two times a day (BID) | ORAL | Status: DC | PRN

## 2023-12-07 MED ORDER — PHENOL 1.4 % MT LIQD
1.0000 | OROMUCOSAL | Status: DC | PRN
  Filled 2023-12-07: qty 177

## 2023-12-07 MED ORDER — IRBESARTAN 300 MG PO TABS
300.0000 mg | ORAL_TABLET | Freq: Every day | ORAL | Status: DC
  Administered 2023-12-07: 300 mg via ORAL
  Filled 2023-12-07: qty 1
  Filled 2023-12-07: qty 2
  Filled 2023-12-07: qty 1

## 2023-12-07 SURGICAL SUPPLY — 53 items
BAG COUNTER SPONGE SURGICOUNT (BAG) ×1 IMPLANT
BAND RUBBER #18 3X1/16 STRL (MISCELLANEOUS) ×2 IMPLANT
BUR CARBIDE MATCH 3.0 (BURR) ×1 IMPLANT
CNTNR URN SCR LID CUP LEK RST (MISCELLANEOUS) ×1 IMPLANT
COVER MAYO STAND STRL (DRAPES) ×1 IMPLANT
DERMABOND ADVANCED .7 DNX12 (GAUZE/BANDAGES/DRESSINGS) IMPLANT
DRAIN JACKSON RD 7FR 3/32 (WOUND CARE) IMPLANT
DRAPE C-ARM 42X72 X-RAY (DRAPES) ×1 IMPLANT
DRAPE LAPAROTOMY 100X72X124 (DRAPES) ×1 IMPLANT
DRAPE MICROSCOPE SLANT 54X150 (MISCELLANEOUS) ×1 IMPLANT
DRAPE SURG 17X23 STRL (DRAPES) ×1 IMPLANT
DRSG OPSITE POSTOP 4X6 (GAUZE/BANDAGES/DRESSINGS) IMPLANT
DURAPREP 26ML APPLICATOR (WOUND CARE) ×1 IMPLANT
ELECT BLADE INSULATED 4IN (ELECTROSURGICAL) ×1 IMPLANT
ELECT COATED BLADE 2.86 ST (ELECTRODE) ×1 IMPLANT
ELECT REM PT RETURN 9FT ADLT (ELECTROSURGICAL) ×1 IMPLANT
ELECTRODE BLADE INSULATED 4IN (ELECTROSURGICAL) ×1 IMPLANT
ELECTRODE REM PT RTRN 9FT ADLT (ELECTROSURGICAL) ×1 IMPLANT
EVACUATOR 1/8 PVC DRAIN (DRAIN) IMPLANT
GAUZE 4X4 16PLY ~~LOC~~+RFID DBL (SPONGE) IMPLANT
GAUZE SPONGE 4X4 12PLY STRL (GAUZE/BANDAGES/DRESSINGS) ×1 IMPLANT
GLOVE BIO SURGEON STRL SZ7 (GLOVE) ×1 IMPLANT
GLOVE BIOGEL PI IND STRL 7.5 (GLOVE) ×1 IMPLANT
GLOVE BIOGEL PI IND STRL 8 (GLOVE) ×1 IMPLANT
GLOVE ECLIPSE 8.0 STRL XLNG CF (GLOVE) ×2 IMPLANT
GLOVE SURG SS PI 7.5 STRL IVOR (GLOVE) IMPLANT
GLOVE SURG SS PI 8.0 STRL IVOR (GLOVE) IMPLANT
GOWN STRL REUS W/ TWL LRG LVL3 (GOWN DISPOSABLE) IMPLANT
GOWN STRL REUS W/ TWL XL LVL3 (GOWN DISPOSABLE) ×2 IMPLANT
GOWN STRL REUS W/TWL 2XL LVL3 (GOWN DISPOSABLE) IMPLANT
HEMOSTAT POWDER KIT SURGIFOAM (HEMOSTASIS) ×1 IMPLANT
KIT BASIN OR (CUSTOM PROCEDURE TRAY) ×1 IMPLANT
KIT POSITION SURG JACKSON T1 (MISCELLANEOUS) ×1 IMPLANT
KIT TURNOVER KIT B (KITS) ×1 IMPLANT
MARKER SKIN DUAL TIP RULER LAB (MISCELLANEOUS) ×1 IMPLANT
NDL HYPO 25X1 1.5 SAFETY (NEEDLE) ×1 IMPLANT
NEEDLE HYPO 25X1 1.5 SAFETY (NEEDLE) ×1 IMPLANT
NS IRRIG 1000ML POUR BTL (IV SOLUTION) ×1 IMPLANT
PACK LAMINECTOMY NEURO (CUSTOM PROCEDURE TRAY) ×1 IMPLANT
PAD ARMBOARD POSITIONER FOAM (MISCELLANEOUS) ×3 IMPLANT
PATTIES SURGICAL .5 X.5 (GAUZE/BANDAGES/DRESSINGS) IMPLANT
PATTIES SURGICAL .5 X1 (DISPOSABLE) IMPLANT
PATTIES SURGICAL 1X1 (DISPOSABLE) IMPLANT
SPIKE FLUID TRANSFER (MISCELLANEOUS) ×1 IMPLANT
SPONGE SURGIFOAM ABS GEL SZ50 (HEMOSTASIS) ×1 IMPLANT
SPONGE T-LAP 4X18 ~~LOC~~+RFID (SPONGE) IMPLANT
STAPLER VISISTAT (STAPLE) IMPLANT
SUT VIC AB 0 CT1 18XCR BRD8 (SUTURE) ×1 IMPLANT
SUT VIC AB 2-0 CP2 18 (SUTURE) ×1 IMPLANT
SUT VIC AB 3-0 SH 8-18 (SUTURE) ×1 IMPLANT
TOWEL GREEN STERILE (TOWEL DISPOSABLE) ×1 IMPLANT
TOWEL GREEN STERILE FF (TOWEL DISPOSABLE) ×1 IMPLANT
WATER STERILE IRR 1000ML POUR (IV SOLUTION) ×1 IMPLANT

## 2023-12-07 NOTE — Plan of Care (Signed)

## 2023-12-07 NOTE — Plan of Care (Signed)
 Problem: Education: Goal: Knowledge of General Education information will improve Description: Including pain rating scale, medication(s)/side effects and non-pharmacologic comfort measures 12/07/2023 1859 by Vincente Liberty, RN Outcome: Completed/Met 12/07/2023 1123 by Vincente Liberty, RN Outcome: Progressing   Problem: Health Behavior/Discharge Planning: Goal: Ability to manage health-related needs will improve 12/07/2023 1859 by Vincente Liberty, RN Outcome: Completed/Met 12/07/2023 1123 by Vincente Liberty, RN Outcome: Progressing   Problem: Clinical Measurements: Goal: Ability to maintain clinical measurements within normal limits will improve 12/07/2023 1859 by Vincente Liberty, RN Outcome: Completed/Met 12/07/2023 1123 by Vincente Liberty, RN Outcome: Progressing Goal: Will remain free from infection 12/07/2023 1859 by Vincente Liberty, RN Outcome: Completed/Met 12/07/2023 1123 by Vincente Liberty, RN Outcome: Progressing Goal: Diagnostic test results will improve 12/07/2023 1859 by Vincente Liberty, RN Outcome: Completed/Met 12/07/2023 1123 by Vincente Liberty, RN Outcome: Progressing Goal: Respiratory complications will improve 12/07/2023 1859 by Vincente Liberty, RN Outcome: Completed/Met 12/07/2023 1123 by Vincente Liberty, RN Outcome: Progressing Goal: Cardiovascular complication will be avoided 12/07/2023 1859 by Vincente Liberty, RN Outcome: Completed/Met 12/07/2023 1123 by Vincente Liberty, RN Outcome: Progressing   Problem: Activity: Goal: Risk for activity intolerance will decrease 12/07/2023 1859 by Vincente Liberty, RN Outcome: Completed/Met 12/07/2023 1123 by Vincente Liberty, RN Outcome: Progressing   Problem: Nutrition: Goal: Adequate nutrition will be maintained 12/07/2023 1859 by Vincente Liberty, RN Outcome: Completed/Met 12/07/2023 1123 by Vincente Liberty, RN Outcome: Progressing   Problem: Coping: Goal: Level of anxiety will decrease 12/07/2023 1859 by Vincente Liberty, RN Outcome: Completed/Met 12/07/2023 1123 by Vincente Liberty, RN Outcome: Progressing   Problem: Elimination: Goal: Will not experience complications related to bowel motility 12/07/2023 1859 by Vincente Liberty, RN Outcome: Completed/Met 12/07/2023 1123 by Vincente Liberty, RN Outcome: Progressing Goal: Will not experience complications related to urinary retention 12/07/2023 1859 by Vincente Liberty, RN Outcome: Completed/Met 12/07/2023 1123 by Vincente Liberty, RN Outcome: Progressing   Problem: Pain Managment: Goal: General experience of comfort will improve and/or be controlled 12/07/2023 1859 by Vincente Liberty, RN Outcome: Completed/Met 12/07/2023 1123 by Vincente Liberty, RN Outcome: Progressing   Problem: Safety: Goal: Ability to remain free from injury will improve 12/07/2023 1859 by Vincente Liberty, RN Outcome: Completed/Met 12/07/2023 1123 by Vincente Liberty, RN Outcome: Progressing   Problem: Skin Integrity: Goal: Risk for impaired skin integrity will decrease 12/07/2023 1859 by Vincente Liberty, RN Outcome: Completed/Met 12/07/2023 1123 by Vincente Liberty, RN Outcome: Progressing   Problem: Education: Goal: Ability to verbalize activity precautions or restrictions will improve 12/07/2023 1859 by Vincente Liberty, RN Outcome: Completed/Met 12/07/2023 1123 by Vincente Liberty, RN Outcome: Progressing Goal: Knowledge of the prescribed therapeutic regimen will improve 12/07/2023 1859 by Vincente Liberty, RN Outcome: Completed/Met 12/07/2023 1123 by Vincente Liberty, RN Outcome: Progressing Goal: Understanding of discharge needs will improve 12/07/2023 1859 by Vincente Liberty, RN Outcome: Completed/Met 12/07/2023 1123 by Vincente Liberty, RN Outcome: Progressing   Problem: Activity: Goal: Ability to avoid complications of mobility impairment will improve 12/07/2023 1859 by Vincente Liberty, RN Outcome: Completed/Met 12/07/2023 1123 by Vincente Liberty, RN Outcome:  Progressing Goal: Ability to tolerate increased activity will improve 12/07/2023 1859 by Vincente Liberty, RN Outcome: Completed/Met 12/07/2023 1123 by Vincente Liberty, RN Outcome: Progressing Goal: Will remain free from falls 12/07/2023 1859 by Vincente Liberty, RN Outcome: Completed/Met 12/07/2023 1123 by Vincente Liberty, RN Outcome: Progressing   Problem: Bowel/Gastric: Goal: Gastrointestinal status for postoperative course will improve 12/07/2023 1859 by Vincente Liberty, RN Outcome: Completed/Met 12/07/2023 1123 by Vincente Liberty, RN Outcome: Progressing   Problem: Clinical Measurements: Goal: Ability to  maintain clinical measurements within normal limits will improve 12/07/2023 1859 by Vincente Liberty, RN Outcome: Completed/Met 12/07/2023 1123 by Vincente Liberty, RN Outcome: Progressing Goal: Postoperative complications will be avoided or minimized 12/07/2023 1859 by Vincente Liberty, RN Outcome: Completed/Met 12/07/2023 1123 by Vincente Liberty, RN Outcome: Progressing Goal: Diagnostic test results will improve 12/07/2023 1859 by Vincente Liberty, RN Outcome: Completed/Met 12/07/2023 1123 by Vincente Liberty, RN Outcome: Progressing   Problem: Pain Management: Goal: Pain level will decrease 12/07/2023 1859 by Vincente Liberty, RN Outcome: Completed/Met 12/07/2023 1123 by Vincente Liberty, RN Outcome: Progressing   Problem: Skin Integrity: Goal: Will show signs of wound healing 12/07/2023 1859 by Vincente Liberty, RN Outcome: Completed/Met 12/07/2023 1123 by Vincente Liberty, RN Outcome: Progressing   Problem: Health Behavior/Discharge Planning: Goal: Identification of resources available to assist in meeting health care needs will improve 12/07/2023 1859 by Vincente Liberty, RN Outcome: Completed/Met 12/07/2023 1123 by Vincente Liberty, RN Outcome: Progressing   Problem: Bladder/Genitourinary: Goal: Urinary functional status for postoperative course will improve 12/07/2023 1859 by  Vincente Liberty, RN Outcome: Completed/Met 12/07/2023 1123 by Vincente Liberty, RN Outcome: Progressing

## 2023-12-07 NOTE — H&P (Signed)
 Providing Compassionate, Quality Care - Together  NEUROSURGERY HISTORY & PHYSICAL   Kayla Khan is an 73 y.o. female.   Chief Complaint: Lower extremity claudication, neurogenic  HPI: this is a 73 year old female with complaints of worsening bilateral lower extremity neurogenic claudication and difficulty walking.  Workup revealed multifactorial lumbar stenosis is severe at L4-5.  She failed multiple conservative measures and therefore presents today for surgical intervention in the form of an L4-5 laminectomies and medial facetectomy.  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 h/o such - cor CT 03/2020 with minimal CAD, consider noncardiac causes of pain.   Palpitations    Pneumonia    PONV (postoperative nausea and vomiting)    Pre-diabetes    Sleep apnea    Thyroid nodule    The patient had a thyroid nodule that was surgically removed in the past.  She has one half of her thyroid and does not take replacement of Taxol and he was benign    Past Surgical History:  Procedure Laterality Date   APPENDECTOMY     BREAST BIOPSY     CESAREAN SECTION     x2   GANGLION CYST EXCISION     THYROIDECTOMY, PARTIAL     left   TUBAL LIGATION     VESICOVAGINAL FISTULA CLOSURE W/ TAH      Family History  Problem Relation Age of Onset   Cancer Mother    Hypertension Father    Aneurysm Father 40        cerebral hemorrhage   Cancer Brother    Stroke Maternal Grandmother    Heart attack Maternal Grandfather    Stroke Paternal Grandfather    Diabetes Other    Heart disease Other    Cancer Other    Arthritis Other    Hypertension Other    Aneurysm Other    Colon cancer Son    Social History:  reports that she has never smoked. She has been exposed to tobacco smoke. She has never used smokeless tobacco. She reports that she does not drink alcohol and does not use drugs.  Allergies:  Allergies  Allergen Reactions   Beta Adrenergic Blockers     Makes chest pain worse   Crestor [Rosuvastatin] Nausea And Vomiting   Indapamide Nausea And Vomiting   Latex Itching   Paxil [Paroxetine] Nausea And Vomiting   Amlodipine     Edema    Amoxicillin-Pot Clavulanate Rash   Hctz [Hydrochlorothiazide] Nausea And Vomiting   Meperidine Hcl Itching and Rash    Medications Prior to Admission  Medication Sig Dispense Refill   ALPRAZolam (XANAX) 0.25 MG tablet TAKE 1 TABLET(0.25 MG) BY MOUTH TWICE DAILY AS NEEDED 35 tablet 3   aspirin EC 81 MG tablet Take 1 tablet (81  mg total) by mouth daily. 90 tablet 3   azelastine (ASTELIN) 0.1 % nasal spray Place 1 spray into both nostrils 2 (two) times daily as needed. Use in each nostril as directed 30 mL 5   BENFOTIAMINE PO Take 300 mcg by mouth daily.     cholecalciferol (VITAMIN D3) 25 MCG (1000 UNIT) tablet Take 2,000 Units by mouth daily.     ciclopirox (LOPROX) 0.77 % cream Apply topically 2 (two) times daily. (Patient taking differently: Apply 1 Application topically 2 (two) times daily as needed (fungus).) 90 g 2   CITRULLINE PO Take 750 mg by mouth 2 (two) times daily.     clobetasol ointment (TEMOVATE) 0.05 % Apply 1 application topically 2 (two) times daily. (Patient taking differently: Apply 1 application  topically 2 (two) times a week.) 30 g 2   Coenzyme Q10 (COQ10) 100 MG CAPS Take 100 mg by mouth daily.     conjugated estrogens (PREMARIN)  vaginal cream Place 1 Applicatorful vaginally 2 (two) times a week.      cyanocobalamin (VITAMIN B12) 1000 MCG tablet Take 1,000 mcg by mouth daily.     desonide (DESOWEN) 0.05 % cream Apply topically 2 (two) times daily. (Patient taking differently: Apply 1 Application topically 2 (two) times daily as needed (itching).) 60 g 2   diltiazem (CARDIZEM CD) 120 MG 24 hr capsule Take 1 capsule (120 mg total) by mouth daily. 30 capsule 11   famotidine (PEPCID) 40 MG tablet TAKE 1 TABLET(40 MG) BY MOUTH DAILY 90 tablet 1   fish oil-omega-3 fatty acids 1000 MG capsule Take 3 g by mouth daily.     fluticasone (FLONASE) 50 MCG/ACT nasal spray Place 2 sprays into both nostrils daily. (Patient taking differently: Place 2 sprays into both nostrils as needed for allergies.) 16 g 5   L-Theanine 200 MG CAPS Take 200 mg by mouth daily.     melatonin 5 MG TABS Take 5 mg by mouth at bedtime.     Multiple Vitamin (MULTIVITAMIN WITH MINERALS) TABS tablet Take 1 tablet by mouth daily.     pravastatin (PRAVACHOL) 40 MG tablet TAKE 1 TABLET(40 MG) BY MOUTH DAILY 90 tablet 1   solifenacin (VESICARE) 5 MG tablet TAKE 1 TABLET(5 MG) BY MOUTH DAILY 90 tablet 0   Suvorexant (BELSOMRA) 15 MG TABS TAKE 1 TABLET BY MOUTH AT BEDTIME AS NEEDED 90 tablet 0   telmisartan (MICARDIS) 80 MG tablet TAKE 1 TABLET(80 MG) BY MOUTH DAILY 90 tablet 1   meclizine (ANTIVERT) 25 MG tablet Take 25 mg by mouth 3 (three) times daily as needed for dizziness.      No results found for this or any previous visit (from the past 48 hours). No results found.  ROS All pertinent positives and negatives are listed in HPI above  Blood pressure 138/63, pulse 77, temperature 98 F (36.7 C), temperature source Oral, resp. rate 17, height 5\' 4"  (1.626 m), weight 81.6 kg, SpO2 96%. 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  We discussed all risks, benefits and expected outcomes as well as alternatives to treatment.  Informed consent was obtained and witnessed.  She failed conservative measures. Thank you for allowing  me to participate in this patient's care.  Please do not hesitate to call with questions or concerns.   Monia Pouch, DO Neurosurgeon [facility] [phone]

## 2023-12-07 NOTE — Transfer of Care (Signed)
 Immediate Anesthesia Transfer of Care Note  Patient: Kayla Khan  Procedure(s) Performed: OPEN LUMBAR LAMINECTOMY AND MEDIAL FACETECTOMY LUMBAR FOUR-LUMBAR FIVE  Patient Location: PACU  Anesthesia Type:General  Level of Consciousness: sedated  Airway & Oxygen Therapy: Patient Spontanous Breathing  Post-op Assessment: Report given to RN  Post vital signs: Reviewed and stable  Last Vitals:  Vitals Value Taken Time  BP 142/61 12/07/23 0930  Temp 36.4 C 12/07/23 0930  Pulse 72 12/07/23 0931  Resp 12 12/07/23 0931  SpO2 97 % 12/07/23 0931  Vitals shown include unfiled device data.  Last Pain:  Vitals:   12/07/23 0624  TempSrc:   PainSc: 0-No pain         Complications: No notable events documented.

## 2023-12-07 NOTE — Op Note (Signed)
  Providing Compassionate, Quality Care - Together   Date of service: 12/07/2023   PREOP DIAGNOSIS:  L4-5 lumbar stenosis with neurogenic claudication   POSTOP DIAGNOSIS: Same   PROCEDURE: Open bilateral L4, L5 laminectomy, medial facetectomies for decompression neural elements Intraoperative use of microscope for microdissection Intraoperative use of fluoroscopy   SURGEON: Dr. Kendell Bane C. [place] Noyce, DO   ASSISTANT: Patrici Ranks, PA   ANESTHESIA: General Endotracheal   EBL: 25 cc   SPECIMENS: None   DRAINS: None   COMPLICATIONS: None   CONDITION: Hemodynamically stable   HISTORY: Kayla Khan is a 73 y.o. female with complaints of progressively worsening neurogenic claudication in the lower extremities.  MRI revealed multifactorial severe lumbar stenosis at L4-5 due to ligamentum and facet hypertrophy.  She failed conservative measures therefore offered her surgical intervention in the form of an L4-5 laminectomy, medial facetectomies for treatment.  We discussed all risks, benefits and expected outcomes as well as alternatives to treatment.   PROCEDURE IN DETAIL: The patient was brought to the operating room. After induction of general anesthesia, the patient was positioned on the operative table in the prone position. All pressure points were meticulously padded. Skin incision was then marked out and prepped and draped in the usual sterile fashion.   Using a 10 blade, midline incision was created over the L4, L5 spinous processes.  Using Bovie electrocautery soft tissue dissection was performed down to the lumbodorsal fascia.  Subperiosteal dissection was performed bilaterally with Bovie electrocautery exposing the L4, L5 lamina bilaterally.  Deep retractors placed in the wound.  Lateral fluoroscopy confirmed the appropriate level.  The microscope was sterilely draped and brought into the field.  Using Leksell rongeur, the spinous process of L4, L5 were removed down to the lamina.   Using a high-speed drill, the L4 lamina was removed bilaterally to the lateral recess down to the ligamentum flavum and superiorly up to the ligamentous attachment bilaterally.  Using high-speed drill, a partial bilateral facetectomy was performed down to the ligamentum flavum.  The superior portion of the L5 lamina was then removed to the epidural space with the high speed drill. The ligamentum flavum was then gently dissected from the epidural space and resected with a series of Kerrison rongeurs to the lateral recess bilaterally.  Using a ball-tipped probe, bilateral lateral recesses appeared decompressed.  I followed the exiting nerve roots out the foramen and they appeared decompressed with a ball-tipped probe.  The bilateral lateral recess were explored again with a ball-tipped probe and noted to be appropriately decompressed.  The thecal sac was pulsatile.  Bilateral foramen were also palpated with a ball-tipped probe and noted to be adequately decompressed.  Epidural hemostasis achieved with Surgifoam.  A mixture of Depo-Medrol and fentanyl was placed in the epidural space with Avitene. Deep retractor was taken out of the wound.  Hemostasis was achieved with bipolar cautery and the soft tissues.  The wound was closed in layers with 0 Vicryl sutures for muscle and fascia.  Dermis was closed with 2-0 and 3-0 Vicryl sutures.  Skin was closed with skin glue.  Sterile dressing was applied.   At the end of the case all sponge, needle, and instrument counts were correct. The patient was then transferred to the stretcher, extubated, and taken to the post-anesthesia care unit in stable hemodynamic condition.

## 2023-12-07 NOTE — Evaluation (Signed)
 Physical Therapy Evaluation Patient Details Name: Kayla Khan MRN: [id number] DOB: October 16, 1950 Today's Date: 12/07/2023  History of Present Illness  Pt is 72 yo female who presents on 12/07/23 for L4-5 laminectomies, medial facetectomies for decompression. PMH: microvascular angina, CAD, pre-diabetes, anemia, arthritis, asthma, depression with anxiety, diverticulosis, DJD, GERD, HTN, HLD, mild MR, sleep apnea, thyroid nodule removal, obesity  Clinical Impression  Pt admitted with above diagnosis. Pt mobilizing well POD 0, ambulated 250' in hallway without AD and this was her second walk. Pt was independent PTA and family available to help after d.c home. Pt educated on positioning, posture, precautions, and activity modifications. She was able to complete a flight of stairs with R rail which she has to her bedroom at home. PT will follow until d/c.  Pt currently with functional limitations due to the deficits listed below (see PT Problem List). Pt will benefit from acute skilled PT to increase their independence and safety with mobility to allow discharge.           If plan is discharge home, recommend the following: Assistance with cooking/housework;Assist for transportation   Can travel by private vehicle        Equipment Recommendations None recommended by PT  Recommendations for Other Services       Functional Status Assessment Patient has had a recent decline in their functional status and demonstrates the ability to make significant improvements in function in a reasonable and predictable amount of time.     Precautions / Restrictions Precautions Precautions: Back Precaution Booklet Issued: Yes (comment) Recall of Precautions/Restrictions: Intact Precaution/Restrictions Comments: reviewed precautions, proper posture, positioning, and activity modifications Restrictions Weight Bearing Restrictions Per Provider Order: No      Mobility  Bed Mobility Overal bed mobility: Needs  Assistance Bed Mobility: Rolling, Sidelying to Sit, Sit to Supine Rolling: Supervision Sidelying to sit: Supervision   Sit to supine: Supervision   General bed mobility comments: vc's for precautions and sequencing    Transfers Overall transfer level: Needs assistance Equipment used: None Transfers: Sit to/from Stand Sit to Stand: Supervision           General transfer comment: supervision for safety    Ambulation/Gait Ambulation/Gait assistance: Supervision Gait Distance (Feet): 250 Feet Assistive device: None Gait Pattern/deviations: Step-through pattern, Decreased stride length Gait velocity: decreased Gait velocity interpretation: 1.31 - 2.62 ft/sec, indicative of limited community ambulator   General Gait Details: pt with erect posture, education given on core activation. Pt steady with level surface ambulation  Stairs Stairs: Yes Stairs assistance: Supervision Stair Management: One rail Right, Alternating pattern, Forwards Number of Stairs: 10 General stair comments: pt able to ascend with R and L. Pt has some discomfort RLE, cued to descend with R first which felt better to her, able to descend with LLE but feeling mildly unstable on R  Wheelchair Mobility     Tilt Bed    Modified Rankin (Stroke Patients Only)       Balance Overall balance assessment: Mild deficits observed, not formally tested                                           Pertinent Vitals/Pain Pain Assessment Pain Assessment: Faces Faces Pain Scale: Hurts little more Pain Location: back Pain Descriptors / Indicators: Operative site guarding, Sore Pain Intervention(s): Limited activity within patient's tolerance, Monitored during session    Home  Living Family/patient expects to be discharged to:: Private residence Living Arrangements: Children Available Help at Discharge: Family;Available 24 hours/day Type of Home: House Home Access: Level entry     Alternate  Level Stairs-Number of Steps: flight Home Layout: Two level Home Equipment: Grab bars - tub/shower Additional Comments: pt's family will be with her. Her son in law is an acute PT at [employer]    Prior Function Prior Level of Function : Independent/Modified Independent             Mobility Comments: independent without AD ADLs Comments: independent     Extremity/Trunk Assessment   Upper Extremity Assessment Upper Extremity Assessment: Overall WFL for tasks assessed    Lower Extremity Assessment Lower Extremity Assessment: Overall WFL for tasks assessed    Cervical / Trunk Assessment Cervical / Trunk Assessment: Back Surgery  Communication   Communication Communication: No apparent difficulties    Cognition Arousal: Alert Behavior During Therapy: WFL for tasks assessed/performed   PT - Cognitive impairments: No apparent impairments                         Following commands: Intact       Cueing Cueing Techniques: Verbal cues     General Comments General comments (skin integrity, edema, etc.): daughter present for education. VSS.    Exercises     Assessment/Plan    PT Assessment Patient needs continued PT services  PT Problem List Decreased activity tolerance;Pain;Decreased knowledge of precautions;Decreased mobility       PT Treatment Interventions Gait training;Stair training;Functional mobility training;Therapeutic activities;Therapeutic exercise;Patient/family education    PT Goals (Current goals can be found in the Care Plan section)  Acute Rehab PT Goals Patient Stated Goal: return home PT Goal Formulation: With patient Time For Goal Achievement: 12/14/23 Potential to Achieve Goals: Good    Frequency Min 3X/week     Co-evaluation               AM-PAC PT "6 Clicks" Mobility  Outcome Measure Help needed turning from your back to your side while in a flat bed without using bedrails?: A Little Help needed moving from lying on  your back to sitting on the side of a flat bed without using bedrails?: A Little Help needed moving to and from a bed to a chair (including a wheelchair)?: A Little Help needed standing up from a chair using your arms (e.g., wheelchair or bedside chair)?: A Little Help needed to walk in hospital room?: A Little Help needed climbing 3-5 steps with a railing? : A Little 6 Click Score: 18    End of Session   Activity Tolerance: Patient tolerated treatment well Patient left: in bed;with call bell/phone within reach;with family/visitor present Nurse Communication: Mobility status PT Visit Diagnosis: Pain Pain - part of body:  (back)    Time: 1610-9604 PT Time Calculation (min) (ACUTE ONLY): 36 min   Charges:   PT Evaluation $PT Eval Low Complexity: 1 Low PT Treatments $Gait Training: 8-22 mins PT General Charges $$ ACUTE PT VISIT: 1 Visit         Lyanne Co, PT  Acute Rehab Services Secure chat preferred Office [phone]   Lawana Chambers Hadlie Gipson 12/07/2023, 4:24 PM

## 2023-12-07 NOTE — Anesthesia Postprocedure Evaluation (Signed)
 Anesthesia Post Note  Patient: Kayla Khan  Procedure(s) Performed: OPEN LUMBAR LAMINECTOMY AND MEDIAL FACETECTOMY LUMBAR FOUR-LUMBAR FIVE     Patient location during evaluation: PACU Anesthesia Type: General Level of consciousness: awake and alert Pain management: pain level controlled Vital Signs Assessment: post-procedure vital signs reviewed and stable Respiratory status: spontaneous breathing, nonlabored ventilation and respiratory function stable Cardiovascular status: stable and blood pressure returned to baseline Anesthetic complications: no  No notable events documented.  Last Vitals:  Vitals:   12/07/23 1015 12/07/23 1030  BP: (!) 147/63   Pulse: (!) 54 (!) 59  Resp: 13 13  Temp:  36.5 C  SpO2: 96% 97%    Last Pain:  Vitals:   12/07/23 1030  TempSrc:   PainSc: 3                  Beryle Lathe

## 2023-12-08 ENCOUNTER — Encounter (HOSPITAL_COMMUNITY): Payer: Self-pay | Admitting: Neurological Surgery

## 2023-12-08 DIAGNOSIS — I10 Essential (primary) hypertension: Secondary | ICD-10-CM | POA: Diagnosis not present

## 2023-12-08 DIAGNOSIS — I2510 Atherosclerotic heart disease of native coronary artery without angina pectoris: Secondary | ICD-10-CM | POA: Diagnosis not present

## 2023-12-08 DIAGNOSIS — Z79899 Other long term (current) drug therapy: Secondary | ICD-10-CM | POA: Diagnosis not present

## 2023-12-08 DIAGNOSIS — M48062 Spinal stenosis, lumbar region with neurogenic claudication: Secondary | ICD-10-CM | POA: Diagnosis not present

## 2023-12-08 DIAGNOSIS — J45909 Unspecified asthma, uncomplicated: Secondary | ICD-10-CM | POA: Diagnosis not present

## 2023-12-08 DIAGNOSIS — Z91040 Latex allergy status: Secondary | ICD-10-CM | POA: Diagnosis not present

## 2023-12-08 DIAGNOSIS — Z7722 Contact with and (suspected) exposure to environmental tobacco smoke (acute) (chronic): Secondary | ICD-10-CM | POA: Diagnosis not present

## 2023-12-08 DIAGNOSIS — Z7982 Long term (current) use of aspirin: Secondary | ICD-10-CM | POA: Diagnosis not present

## 2023-12-08 MED ORDER — METHOCARBAMOL 500 MG PO TABS: 500.0000 mg | ORAL_TABLET | Freq: Four times a day (QID) | ORAL | 2 refills | Status: AC | PRN

## 2023-12-08 MED ORDER — HYDROCODONE-ACETAMINOPHEN 5-325 MG PO TABS: 1.0000 | ORAL_TABLET | ORAL | 0 refills | Status: DC | PRN

## 2023-12-08 MED ORDER — ASPIRIN 81 MG PO TBEC: 81.0000 mg | DELAYED_RELEASE_TABLET | Freq: Every day | ORAL | Status: AC

## 2023-12-08 MED FILL — Thrombin For Soln 5000 Unit: CUTANEOUS | Qty: 2 | Status: AC

## 2023-12-08 NOTE — Care Management Obs Status (Signed)
 MEDICARE OBSERVATION STATUS NOTIFICATION   Patient Details  Name: Kayla Khan MRN: [id number] Date of Birth: [birth year]   Medicare Observation Status Notification Given:  Yes    Kermit Balo, RN 12/08/2023, 10:18 AM

## 2023-12-08 NOTE — Progress Notes (Signed)
 Physical Therapy Treatment and Discharge  Patient Details Name: Kayla Khan MRN: [id number] DOB: [birth year] Today's Date: 12/08/2023   History of Present Illness Pt is 73 yo female who presents on 12/07/23 for L4-5 laminectomies, medial facetectomies for decompression. PMH: microvascular angina, CAD, pre-diabetes, anemia, arthritis, asthma, depression with anxiety, diverticulosis, DJD, GERD, HTN, HLD, mild MR, sleep apnea, thyroid nodule removal, obesity    PT Comments  Pt progressing well with post-op mobility. She was able to demonstrate transfers and ambulation with gross modified independence and no AD. Reinforced education on precautions, pet feeding/management, appropriate activity progression, and car transfer. Pt has met acute PT goals at this time and pt anticipates d/c home today. Will sign off at this time. If needs change, please reconsult.    If plan is discharge home, recommend the following: Assist for transportation   Can travel by private vehicle        Equipment Recommendations  None recommended by PT    Recommendations for Other Services       Precautions / Restrictions Precautions Precautions: Back Precaution Booklet Issued: Yes (comment) Recall of Precautions/Restrictions: Intact Precaution/Restrictions Comments: Reviewed handout and pt was cued for precautions during functional mobility. Restrictions Weight Bearing Restrictions Per Provider Order: No     Mobility  Bed Mobility Overal bed mobility: Modified Independent Bed Mobility: Rolling, Sidelying to Sit, Sit to Sidelying Rolling: Modified independent (Device/Increase time) Sidelying to sit: Modified independent (Device/Increase time)     Sit to sidelying: Modified independent (Device/Increase time) General bed mobility comments: Increased time and cues for optimal log roll technique, however no assist required.    Transfers Overall transfer level: Modified independent Equipment used:  None Transfers: Sit to/from Stand             General transfer comment: Pt demonstrating proper hand placement on seated surface for safety. No assist required.    Ambulation/Gait Ambulation/Gait assistance: Modified independent (Device/Increase time) Gait Distance (Feet): 500 Feet Assistive device: None Gait Pattern/deviations: Step-through pattern, Decreased stride length Gait velocity: Decreased Gait velocity interpretation: 1.31 - 2.62 ft/sec, indicative of limited community ambulator   General Gait Details: Pt moving slow but generally steady without assistance. Pt with decreased natural arm swing during gait. No overt LOB noted.   Stairs         General stair comments: Pt reports she feels comfortable with stair negotiation.   Wheelchair Mobility     Tilt Bed    Modified Rankin (Stroke Patients Only)       Balance Overall balance assessment: Mild deficits observed, not formally tested                                          Communication Communication Communication: No apparent difficulties  Cognition Arousal: Alert Behavior During Therapy: WFL for tasks assessed/performed   PT - Cognitive impairments: No apparent impairments                         Following commands: Intact      Cueing Cueing Techniques: Verbal cues  Exercises      General Comments        Pertinent Vitals/Pain Pain Assessment Pain Assessment: 0-10 Pain Score: 5  Pain Location: back Pain Descriptors / Indicators: Operative site guarding, Sore Pain Intervention(s): Limited activity within patient's tolerance, Monitored during session, Repositioned    Home Living  Prior Function            PT Goals (current goals can now be found in the care plan section) Acute Rehab PT Goals Patient Stated Goal: return home PT Goal Formulation: With patient Time For Goal Achievement: 12/14/23 Potential to Achieve  Goals: Good Progress towards PT goals: Progressing toward goals    Frequency    Min 3X/week      PT Plan      Co-evaluation              AM-PAC PT "6 Clicks" Mobility   Outcome Measure  Help needed turning from your back to your side while in a flat bed without using bedrails?: A Little Help needed moving from lying on your back to sitting on the side of a flat bed without using bedrails?: A Little Help needed moving to and from a bed to a chair (including a wheelchair)?: A Little Help needed standing up from a chair using your arms (e.g., wheelchair or bedside chair)?: A Little Help needed to walk in hospital room?: A Little Help needed climbing 3-5 steps with a railing? : A Little 6 Click Score: 18    End of Session Equipment Utilized During Treatment: Gait belt Activity Tolerance: Patient tolerated treatment well Patient left: in bed;with call bell/phone within reach Nurse Communication: Mobility status PT Visit Diagnosis: Pain Pain - part of body:  (back)     Time: 0981-1914 PT Time Calculation (min) (ACUTE ONLY): 16 min  Charges:    $Gait Training: 8-22 mins PT General Charges $$ ACUTE PT VISIT: 1 Visit                     Conni Slipper, PT, DPT Acute Rehabilitation Services Secure Chat Preferred Office: [phone]    Marylynn Pearson 12/08/2023, 9:35 AM

## 2023-12-08 NOTE — Progress Notes (Signed)
Patient alert and oriented, mae's well, voiding adequate amount of urine, swallowing without difficulty, no c/o pain at time of discharge. Patient discharged home with family. Script and discharged instructions given to patient. Patient and family stated understanding of instructions given. Patient has an appointment with Dr. Dawley °

## 2023-12-08 NOTE — Evaluation (Signed)
 Occupational Therapy Evaluation Patient Details Name: Kayla Khan MRN: [id number] DOB: 07-Nov-1950 Today's Date: 12/08/2023   History of Present Illness   73 yo female who presents on 12/07/23 for L4-5 laminectomies, medial facetectomies for decompression. PMH: microvascular angina, CAD, pre-diabetes, anemia, arthritis, asthma, depression with anxiety, diverticulosis, DJD, GERD, HTN, HLD, mild MR, sleep apnea, thyroid nodule removal, obesity     Clinical Impressions Patient is s/p L4-5 laminectomies surgery resulting in functional limitations due to the deficits listed below (see OT problem list). Pt is mod I with adls at this time and educated on back precautions. Pt needs reminders for "lifting" restriction. Daughter will be present to help with home setup upon d/c to make a safe environment with precautions.  Patient will benefit from skilled OT acutely to increase independence and safety with ADLS to allow discharge home.      If plan is discharge home, recommend the following:         Functional Status Assessment   Patient has had a recent decline in their functional status and demonstrates the ability to make significant improvements in function in a reasonable and predictable amount of time.     Equipment Recommendations   None recommended by OT     Recommendations for Other Services         Precautions/Restrictions   Precautions Precautions: Back Precaution Booklet Issued: Yes (comment) Recall of Precautions/Restrictions: Intact Precaution/Restrictions Comments: handout present and reviewed for adls Restrictions Weight Bearing Restrictions Per Provider Order: No     Mobility Bed Mobility               General bed mobility comments: oob on arrival . pt able to verbalize sequence    Transfers Overall transfer level: Modified independent                 General transfer comment: use of hands properly      Balance                                            ADL either performed or assessed with clinical judgement   ADL Overall ADL's : Modified independent                                       General ADL Comments: educated on positioining for showering transfer, reading with a book, position for caring for cats, toilet hygiene options, showering needs. pt dressed on arrival and able to demo a figure 4 cross for LB.     Vision Baseline Vision/History: 1 Wears glasses Ability to See in Adequate Light: 0 Adequate Vision Assessment?: No apparent visual deficits     Perception         Praxis         Pertinent Vitals/Pain Pain Assessment Pain Assessment: 0-10 Faces Pain Scale: Hurts a little bit Pain Location: back Pain Descriptors / Indicators: Operative site guarding, Sore Pain Intervention(s): Monitored during session, Premedicated before session, Repositioned     Extremity/Trunk Assessment Upper Extremity Assessment Upper Extremity Assessment: Overall WFL for tasks assessed   Lower Extremity Assessment Lower Extremity Assessment: Overall WFL for tasks assessed   Cervical / Trunk Assessment Cervical / Trunk Assessment: Back Surgery   Communication Communication Communication: No apparent difficulties   Cognition Arousal: Alert Behavior  During Therapy: WFL for tasks assessed/performed Cognition: No apparent impairments                               Following commands: Intact       Cueing  General Comments   Cueing Techniques: Verbal cues      Exercises     Shoulder Instructions      Home Living Family/patient expects to be discharged to:: Private residence Living Arrangements: Children Available Help at Discharge: Family;Available 24 hours/day Type of Home: House Home Access: Level entry     Home Layout: Two level Alternate Level Stairs-Number of Steps: flight Alternate Level Stairs-Rails: Right Bathroom Shower/Tub: Tub/shower  unit   Bathroom Toilet: Handicapped height     Home Equipment: Grab bars - tub/shower   Additional Comments: pt's family will be with her. Her son in law is an acute PT at [facility] acute      Prior Functioning/Environment Prior Level of Function : Independent/Modified Independent             Mobility Comments: independent without AD ADLs Comments: independent    OT Problem List:     OT Treatment/Interventions:        OT Goals(Current goals can be found in the care plan section)   Acute Rehab OT Goals Patient Stated Goal: to read   OT Frequency:       Co-evaluation              AM-PAC OT "6 Clicks" Daily Activity     Outcome Measure Help from another person eating meals?: None Help from another person taking care of personal grooming?: None Help from another person toileting, which includes using toliet, bedpan, or urinal?: None Help from another person bathing (including washing, rinsing, drying)?: None Help from another person to put on and taking off regular upper body clothing?: None Help from another person to put on and taking off regular lower body clothing?: None 6 Click Score: 24   End of Session Nurse Communication: Mobility status;Precautions  Activity Tolerance: Patient tolerated treatment well Patient left: in chair;with call bell/phone within reach;with family/visitor present  OT Visit Diagnosis: Unsteadiness on feet (R26.81)                Time: 4098-1191 OT Time Calculation (min): 30 min Charges:  OT General Charges $OT Visit: 1 Visit OT Evaluation $OT Eval Low Complexity: 1 Low   Brynn, OTR/L  Acute Rehabilitation Services Office: [phone] .   Mateo Flow 12/08/2023, 11:29 AM

## 2023-12-08 NOTE — Discharge Instructions (Addendum)
 Wound Care Keep incision covered and dry until post op day 3. You may remove the Honeycomb dressing on post op day 3. Leave steri-strips on back.  They will fall off by themselves. Do not put any creams, lotions, or ointments on incision. You are fine to shower. Let water run over incision and pat dry.   Activity Walk each and every day, increasing distance each day. No lifting greater than 8 lbs.  No lifting no bending no twisting no driving     Call Your Doctor If Any of These Occur Redness, drainage, or swelling at the wound.  Temperature greater than 101 degrees. Severe pain not relieved by pain medication. Incision starts to come apart.  Follow Up Appt Call [phone] if you have one or any problem.

## 2023-12-08 NOTE — Discharge Summary (Signed)
 Physician Discharge Summary  Patient ID: Kayla Khan MRN: [id number] DOB/AGE: October 21, 1950 73 y.o.  Admit date: 12/07/2023 Discharge date: 12/08/2023  Admission Diagnoses:  L4-5 lumbar stenosis with neurogenic claudication  Discharge Diagnoses:  Same Principal Problem:   Lumbar spinal stenosis   Discharged Condition: Stable  Hospital Course:  Kayla Khan is a 73 y.o. female with a history of neurogenic claudication.  She underwent an elective L4-5 lumbar decompression.  She tolerated surgery well, her pain was controlled postoperatively on oral medication.  She is having normal bowel bladder function.  She is ambulating independently.  Her wound was clean dry and intact.  Treatments: Surgery -L4-5 bilateral laminectomies, medial facetectomies  Discharge Exam: Blood pressure (!) 141/48, pulse 80, temperature 97.7 F (36.5 C), temperature source Oral, resp. rate 16, height 5\' 4"  (1.626 m), weight 81.6 kg, SpO2 97%. Awake, alert, orientedx3 Nonlabored breathing Speech fluent, appropriate CN grossly intact 5/5 BUE/BLE Wound c/d/i  Disposition: Discharge disposition: 01-Home or Self Care       Discharge Instructions     Incentive spirometry RT   Complete by: As directed       Allergies as of 12/08/2023       Reactions   Beta Adrenergic Blockers    Makes chest pain worse   Crestor [rosuvastatin] Nausea And Vomiting   Indapamide Nausea And Vomiting   Latex Itching   Paxil [paroxetine] Nausea And Vomiting   Amlodipine    Edema   Amoxicillin-pot Clavulanate Rash   Hctz [hydrochlorothiazide] Nausea And Vomiting   Meperidine Hcl Itching, Rash        Medication List     TAKE these medications    ALPRAZolam 0.25 MG tablet Commonly known as: XANAX TAKE 1 TABLET(0.25 MG) BY MOUTH TWICE DAILY AS NEEDED   aspirin EC 81 MG tablet Take 1 tablet (81 mg total) by mouth daily. Start taking on: December 16, 2023 What changed: These instructions start on December 16, 2023. If you are unsure what to do until then, ask your doctor or other care provider.   azelastine 0.1 % nasal spray Commonly known as: ASTELIN Place 1 spray into both nostrils 2 (two) times daily as needed. Use in each nostril as directed   Belsomra 15 MG Tabs Generic drug: Suvorexant TAKE 1 TABLET BY MOUTH AT BEDTIME AS NEEDED   BENFOTIAMINE PO Take 300 mcg by mouth daily.   cholecalciferol 25 MCG (1000 UNIT) tablet Commonly known as: VITAMIN D3 Take 2,000 Units by mouth daily.   ciclopirox 0.77 % cream Commonly known as: Loprox Apply topically 2 (two) times daily. What changed:  how much to take when to take this reasons to take this   CITRULLINE PO Take 750 mg by mouth 2 (two) times daily.   clobetasol ointment 0.05 % Commonly known as: TEMOVATE Apply 1 application topically 2 (two) times daily. What changed: when to take this   conjugated estrogens 0.625 MG/GM vaginal cream Commonly known as: PREMARIN Place 1 Applicatorful vaginally 2 (two) times a week.   CoQ10 100 MG Caps Take 100 mg by mouth daily.   cyanocobalamin 1000 MCG tablet Commonly known as: VITAMIN B12 Take 1,000 mcg by mouth daily.   desonide 0.05 % cream Commonly known as: DesOwen Apply topically 2 (two) times daily. What changed:  how much to take when to take this reasons to take this   diltiazem 120 MG 24 hr capsule Commonly known as: Cardizem CD Take 1 capsule (120 mg total) by  mouth daily.   famotidine 40 MG tablet Commonly known as: PEPCID TAKE 1 TABLET(40 MG) BY MOUTH DAILY   fish oil-omega-3 fatty acids 1000 MG capsule Take 3 g by mouth daily.   fluticasone 50 MCG/ACT nasal spray Commonly known as: FLONASE Place 2 sprays into both nostrils daily. What changed:  when to take this reasons to take this   HYDROcodone-acetaminophen 5-325 MG tablet Commonly known as: NORCO/VICODIN Take 1-2 tablets by mouth every 4 (four) hours as needed for severe pain (pain score  7-10).   L-Theanine 200 MG Caps Take 200 mg by mouth daily.   meclizine 25 MG tablet Commonly known as: ANTIVERT Take 25 mg by mouth 3 (three) times daily as needed for dizziness.   melatonin 5 MG Tabs Take 5 mg by mouth at bedtime.   methocarbamol 500 MG tablet Commonly known as: ROBAXIN Take 1 tablet (500 mg total) by mouth every 6 (six) hours as needed for muscle spasms.   multivitamin with minerals Tabs tablet Take 1 tablet by mouth daily.   pravastatin 40 MG tablet Commonly known as: PRAVACHOL TAKE 1 TABLET(40 MG) BY MOUTH DAILY   solifenacin 5 MG tablet Commonly known as: VESICARE TAKE 1 TABLET(5 MG) BY MOUTH DAILY   telmisartan 80 MG tablet Commonly known as: MICARDIS TAKE 1 TABLET(80 MG) BY MOUTH DAILY        Follow-up Information     Marshay Slates C, DO. Call.   Why: As needed, If symptoms worsen Contact information: [address] [place] [address] [place] [address] [phone]                 Signed: Alan Mulder Fe Okubo 12/08/2023, 8:43 AM

## 2023-12-26 ENCOUNTER — Other Ambulatory Visit: Payer: Self-pay | Admitting: Internal Medicine

## 2023-12-26 DIAGNOSIS — E785 Hyperlipidemia, unspecified: Secondary | ICD-10-CM

## 2024-01-07 ENCOUNTER — Other Ambulatory Visit: Payer: Self-pay | Admitting: Internal Medicine

## 2024-01-07 DIAGNOSIS — E785 Hyperlipidemia, unspecified: Secondary | ICD-10-CM

## 2024-01-07 MED ORDER — PRAVASTATIN SODIUM 40 MG PO TABS: 40.0000 mg | ORAL_TABLET | Freq: Every day | ORAL | 1 refills | Status: DC

## 2024-01-07 NOTE — Telephone Encounter (Signed)
 Copied from CRM [id number]. Topic: Clinical - Medication Refill >> Jan 07, 2024 11:07 AM Albertha Alosa wrote: Most Recent Primary Care Visit:  Provider: [facility]  Department: [facility]  Visit Type: LAB  Date: 10/22/2023  Medication: pravastatin  (PRAVACHOL ) 40 MG tablet  Has the patient contacted their pharmacy? Yes (Agent: If no, request that the patient contact the pharmacy for the refill. If patient does not wish to contact the pharmacy document the reason why and proceed with request.) (Agent: If yes, when and what did the pharmacy advise?)  Is this the correct pharmacy for this prescription? Yes If no, delete pharmacy and type the correct one.  This is the patient's preferred pharmacy:   [facility] - [place], [place] - [address] [place] [address] Phone: [phone] Fax: [phone]   Has the prescription been filled recently? No  Is the patient out of the medication? Yes  Has the patient been seen for an appointment in the last year OR does the patient have an upcoming appointment? Yes  Can we respond through MyChart? Yes  Agent: Please be advised that Rx refills may take up to 3 business days. We ask that you follow-up with your pharmacy.

## 2024-02-08 DIAGNOSIS — M48062 Spinal stenosis, lumbar region with neurogenic claudication: Secondary | ICD-10-CM | POA: Diagnosis not present

## 2024-02-10 DIAGNOSIS — M48062 Spinal stenosis, lumbar region with neurogenic claudication: Secondary | ICD-10-CM | POA: Diagnosis not present

## 2024-02-14 DIAGNOSIS — M48062 Spinal stenosis, lumbar region with neurogenic claudication: Secondary | ICD-10-CM | POA: Diagnosis not present

## 2024-02-16 DIAGNOSIS — M48062 Spinal stenosis, lumbar region with neurogenic claudication: Secondary | ICD-10-CM | POA: Diagnosis not present

## 2024-02-17 ENCOUNTER — Other Ambulatory Visit: Payer: Self-pay | Admitting: Internal Medicine

## 2024-02-22 DIAGNOSIS — M48062 Spinal stenosis, lumbar region with neurogenic claudication: Secondary | ICD-10-CM | POA: Diagnosis not present

## 2024-02-24 DIAGNOSIS — M48062 Spinal stenosis, lumbar region with neurogenic claudication: Secondary | ICD-10-CM | POA: Diagnosis not present

## 2024-02-29 DIAGNOSIS — M48062 Spinal stenosis, lumbar region with neurogenic claudication: Secondary | ICD-10-CM | POA: Diagnosis not present

## 2024-03-02 DIAGNOSIS — M48062 Spinal stenosis, lumbar region with neurogenic claudication: Secondary | ICD-10-CM | POA: Diagnosis not present

## 2024-03-03 DIAGNOSIS — G4733 Obstructive sleep apnea (adult) (pediatric): Secondary | ICD-10-CM | POA: Diagnosis not present

## 2024-03-04 NOTE — Progress Notes (Unsigned)
 Cardiology Office Note    Date:  03/06/2024  ID:  KEYMANI GLYNN, DOB Aug 26, 1951, MRN [id number] PCP:  Joshua Debby LITTIE, MD  Cardiologist:  Stanly DELENA Leavens, MD  Electrophysiologist:  None   Chief Complaint: f/u BP  History of Present Illness: .    Kayla Khan is a 73 y.o. 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 She was a prior patient of Dr. Micky and Dr. Maranda and now Dr. Leavens. She had a normal nuclear stress test in 2008. The patient felt symptomatically improved after starting L-citruline and L-arginine. In 02/2020 she was having some left upper arm pain and chest discomfort with exertion so coronary CTA was performed 03/2020 showing minimal nonobstructive disease with otherwise no significant findings. Regarding lipids, she's previously had intolerance to Crestor  so has been managed on pravastatin . Echo pursued 08/2023 for murmur and dyspnea showed EF 60-65%, normal RV, mild MR. The patient had felt her dyspnea was due to lack of activity due to back pain. At last OV 11/2023 we started diltiazem  120mg  for blood pressure as Dr. Leavens had suggested either this or verapamil if needed going forward (she is very sensitive to medication in general).  She is seen back for follow-up doing well. She had her back surgery. It fixed some of her back symptoms but not all of them. She is now in PT and has not had any issues with unusual CP, SOB, palpitations. She is tolerating diltiazem  well without adverse effect. She brings in a list of BP readings with about 50% slightly above goal of 130s; no low readings.  Labwork independently reviewed: 11/2023 K 4.1, Cr 0.88, BUN  chronically up, CBC OK 09/2023 LDL 67, trig 97, CBC LFTs TSH ok, K 3.8, Cr 0.82   ROS: .    Please see the history of present illness.  All other systems are reviewed and otherwise negative.  Studies Reviewed: SABRA    EKG:  EKG is not ordered today  CV Studies: Cardiac studies reviewed are outlined and summarized above. Otherwise please see EMR for full report.   Current Reported Medications:.    Current Meds  Medication Sig   ALPRAZolam  (XANAX ) 0.25 MG tablet TAKE 1 TABLET(0.25 MG) BY MOUTH TWICE DAILY AS NEEDED   aspirin  EC 81 MG tablet Take 1 tablet (81 mg total) by mouth daily.   azelastine  (ASTELIN ) 0.1 % nasal spray Place 1 spray into both nostrils 2 (two) times daily as needed. Use in each nostril as directed   BENFOTIAMINE PO Take 300 mcg by mouth daily.   cholecalciferol (VITAMIN D3) 25 MCG (1000 UNIT) tablet Take 2,000 Units by mouth daily.   ciclopirox  (LOPROX ) 0.77 % cream Apply topically 2 (two) times daily. (Patient taking differently: Apply 1 Application topically 2 (two) times daily as needed (fungus).)   CITRULLINE PO Take 750 mg by mouth 2 (two) times daily.   clobetasol  ointment (TEMOVATE ) 0.05 % Apply 1 application topically 2 (two) times daily. (Patient taking differently: Apply 1 application  topically 2 (two) times a week.)   Coenzyme Q10 (COQ10) 100 MG CAPS Take 100 mg by mouth daily.   conjugated estrogens (PREMARIN) vaginal cream Place 1 Applicatorful vaginally 2 (two) times a week.  cyanocobalamin (VITAMIN B12) 1000 MCG tablet Take 1,000 mcg by mouth daily.   desonide  (DESOWEN ) 0.05 % cream Apply topically 2 (two) times daily. (Patient taking differently: Apply 1 Application topically 2 (two) times daily as needed (itching).)   diltiazem  (CARDIZEM  CD) 120 MG 24 hr capsule Take 1 capsule (120 mg total) by mouth daily.   famotidine  (PEPCID ) 40 MG tablet TAKE 1 TABLET(40 MG) BY MOUTH DAILY   fish oil-omega-3 fatty acids 1000 MG capsule Take 3 g by mouth daily.    fluticasone  (FLONASE ) 50 MCG/ACT nasal spray Place 2 sprays into both nostrils daily. (Patient taking differently: Place 2 sprays into both nostrils as needed for allergies.)   L-Theanine 200 MG CAPS Take 200 mg by mouth daily.   meclizine  (ANTIVERT ) 25 MG tablet Take 25 mg by mouth 3 (three) times daily as needed for dizziness.   melatonin 5 MG TABS Take 5 mg by mouth at bedtime.   methocarbamol  (ROBAXIN ) 500 MG tablet Take 1 tablet (500 mg total) by mouth every 6 (six) hours as needed for muscle spasms.   Multiple Vitamin (MULTIVITAMIN WITH MINERALS) TABS tablet Take 1 tablet by mouth daily.   pravastatin  (PRAVACHOL ) 40 MG tablet Take 1 tablet (40 mg total) by mouth daily.   solifenacin  (VESICARE ) 5 MG tablet TAKE 1 TABLET(5 MG) BY MOUTH DAILY.   Suvorexant  (BELSOMRA ) 15 MG TABS TAKE 1 TABLET BY MOUTH AT BEDTIME AS NEEDED   telmisartan  (MICARDIS ) 80 MG tablet TAKE 1 TABLET(80 MG) BY MOUTH DAILY    Physical Exam:    VS:  BP 131/77   Pulse 77   Ht 5' 4 (1.626 m)   Wt 185 lb (83.9 kg)   SpO2 95%   BMI 31.76 kg/m    Wt Readings from Last 3 Encounters:  03/06/24 185 lb (83.9 kg)  12/07/23 180 lb (81.6 kg)  11/25/23 185 lb (83.9 kg)    GEN: Well nourished, well developed in no acute distress NECK: No JVD; No carotid bruits CARDIAC: RRR, no murmurs, rubs, gallops RESPIRATORY:  Clear to auscultation without rales, wheezing or rhonchi  ABDOMEN: Soft, non-tender, non-distended EXTREMITIES:  No edema; No acute deformity   Asessement and Plan:.    1. Essential HTN - historically sensitive to medication at times, but tolerating diltiazem  120mg  well. Also on telmisartan  80mg  daily. Blood pressure slightly above goal at times. Will trial increase in diltiazem  to 180mg  daily and see how she does. She will continue with BP log and send in readings in 2 weeks.  Focused OV for BP. Otherwise see 11/2023 OV for cardiac assessment of other cardiac problems, felt stable at this time.     Disposition: F/u with Dr. Santo in 1 year, sooner if any issues arise.  Signed, Jeslynn Hollander N Trenten Watchman, PA-C

## 2024-03-06 ENCOUNTER — Ambulatory Visit: Admitting: Physician Assistant

## 2024-03-06 ENCOUNTER — Ambulatory Visit: Attending: Physician Assistant | Admitting: Physician Assistant

## 2024-03-06 ENCOUNTER — Ambulatory Visit: Payer: Medicare PPO | Admitting: Adult Health

## 2024-03-06 ENCOUNTER — Encounter: Payer: Self-pay | Admitting: Physician Assistant

## 2024-03-06 VITALS — BP 131/77 | HR 77 | Ht 64.0 in | Wt 185.0 lb

## 2024-03-06 DIAGNOSIS — I340 Nonrheumatic mitral (valve) insufficiency: Secondary | ICD-10-CM

## 2024-03-06 DIAGNOSIS — I2510 Atherosclerotic heart disease of native coronary artery without angina pectoris: Secondary | ICD-10-CM

## 2024-03-06 DIAGNOSIS — I10 Essential (primary) hypertension: Secondary | ICD-10-CM | POA: Diagnosis not present

## 2024-03-06 DIAGNOSIS — E785 Hyperlipidemia, unspecified: Secondary | ICD-10-CM

## 2024-03-06 MED ORDER — DILTIAZEM HCL ER COATED BEADS 180 MG PO CP24: 180.0000 mg | ORAL_CAPSULE | Freq: Every day | ORAL | 3 refills | Status: DC

## 2024-03-06 NOTE — Patient Instructions (Addendum)
 Medication Instructions:  INCREASE DILTIAZEM  TO 180 MG DAILY *If you need a refill on your cardiac medications before your next appointment, please call your pharmacy*  Lab Work: NO LABS If you have labs (blood work) drawn today and your tests are completely normal, you will receive your results only by: MyChart Message (if you have MyChart) OR A paper copy in the mail If you have any lab test that is abnormal or we need to change your treatment, we will call you to review the results.  Testing/Procedures: NO TESTING  Follow-Up: At [facility], you and your health needs are our priority.  As part of our continuing mission to provide you with exceptional heart care, our providers are all part of one team.  This team includes your primary Cardiologist (physician) and Advanced Practice Providers or APPs (Physician Assistants and Nurse Practitioners) who all work together to provide you with the care you need, when you need it.  Your next appointment:   1 year(s)  Provider:   Stanly DELENA Leavens, MD   Other Instructions Please submit log of blood pressure and heart rate in 2 weeks via Mychart at least 3 hours after AM meds.

## 2024-03-07 DIAGNOSIS — Z01419 Encounter for gynecological examination (general) (routine) without abnormal findings: Secondary | ICD-10-CM | POA: Diagnosis not present

## 2024-03-07 DIAGNOSIS — L900 Lichen sclerosus et atrophicus: Secondary | ICD-10-CM | POA: Diagnosis not present

## 2024-03-07 DIAGNOSIS — M48062 Spinal stenosis, lumbar region with neurogenic claudication: Secondary | ICD-10-CM | POA: Diagnosis not present

## 2024-03-07 DIAGNOSIS — N958 Other specified menopausal and perimenopausal disorders: Secondary | ICD-10-CM | POA: Diagnosis not present

## 2024-03-07 DIAGNOSIS — N898 Other specified noninflammatory disorders of vagina: Secondary | ICD-10-CM | POA: Diagnosis not present

## 2024-03-07 DIAGNOSIS — M8588 Other specified disorders of bone density and structure, other site: Secondary | ICD-10-CM | POA: Diagnosis not present

## 2024-03-07 DIAGNOSIS — Z6831 Body mass index (BMI) 31.0-31.9, adult: Secondary | ICD-10-CM | POA: Diagnosis not present

## 2024-03-08 ENCOUNTER — Encounter: Payer: Self-pay | Admitting: Adult Health

## 2024-03-08 ENCOUNTER — Ambulatory Visit: Admitting: Adult Health

## 2024-03-08 VITALS — BP 138/70 | HR 81 | Ht 64.0 in | Wt 188.2 lb

## 2024-03-08 DIAGNOSIS — G4733 Obstructive sleep apnea (adult) (pediatric): Secondary | ICD-10-CM | POA: Diagnosis not present

## 2024-03-08 NOTE — Addendum Note (Signed)
 Addended by: Leeandre Nordling N on: 03/08/2024 01:01 PM   Modules accepted: Level of Service

## 2024-03-08 NOTE — Patient Instructions (Signed)
 Continue on CPAP At bedtime.  Keep up good work .  Stay active , work on weight loss  Do not drive if sleepy  Follow up Dr. Jude  In 1 year and As needed

## 2024-03-08 NOTE — Assessment & Plan Note (Signed)
 Excellent control compliance on nocturnal CPAP.  Continue on current settings  Plan  Patient Instructions  Continue on CPAP At bedtime.  Keep up good work .  Stay active , work on weight loss  Do not drive if sleepy  Follow up Dr. Jude  In 1 year and As needed

## 2024-03-08 NOTE — Progress Notes (Signed)
 @Patient  ID: Kayla Khan, female    DOB: 11/22/1950, 73 y.o.   MRN: [id number]  Chief Complaint  Patient presents with   Follow-up    Referring provider: Joshua Debby LITTIE, MD  HPI: 73 year old female followed for obstructive sleep apnea on CPAP. Medical history significant for spinal stenosis  TEST/EVENTS :  NPSG 2004:  AHI 10/hr, cpap optimized to 5cm.   03/08/2024 Follow up : OSA  Patient presents for a 1 year follow-up.  Patient has obstructive sleep apnea is on nocturnal CPAP.  Patient says she is doing very well on CPAP.  Feels that she is benefiting from CPAP with decreased daytime sleepiness.  CPAP download shows 100% compliance.  Daily average usage at 7.5 hours.  Patient is on auto CPAP 5 to 15 cm H2O.  AHI 1.9/hour.  Daily average pressure at 9.9 cm H2O.  Underwent back surgery in April 2025.  Had an L4-L5 lumbar decompression for lumbar spinal stenosis.  Patient says she is doing well from surgery.  She is continuing in physical therapy.  Says her sciatica pain has resolved completely.  Continues to have some weakness but is working to improve her strength.  Allergies  Allergen Reactions   Beta Adrenergic Blockers     Makes chest pain worse   Crestor  [Rosuvastatin ] Nausea And Vomiting   Indapamide  Nausea And Vomiting   Latex Itching   Paxil  [Paroxetine ] Nausea And Vomiting   Amlodipine      Edema    Amoxicillin-Pot Clavulanate Rash   Hctz [Hydrochlorothiazide ] Nausea And Vomiting   Meperidine Hcl Itching and Rash    Immunization History  Administered Date(s) Administered   DTaP 10/18/2004   Fluad  Quad(high Dose 65+) 06/04/2022   Fluad  Trivalent(High Dose 65+) 05/18/2023   Influenza Split 07/08/2012, 04/16/2016   Influenza Whole 06/19/2010, 06/30/2011   Influenza, High Dose Seasonal PF 04/24/2017, 06/06/2018, 04/26/2019, 05/07/2020   Influenza,inj,Quad PF,6+ Mos 07/03/2013   Influenza-Unspecified 06/07/2014, 05/24/2017   PFIZER(Purple Top)SARS-COV-2  Vaccination 10/13/2019, 11/07/2019, 06/03/2020   Pfizer(Comirnaty )Fall Seasonal Vaccine 12 years and older 05/18/2023   Pneumococcal Conjugate-13 03/22/2013   Pneumococcal Polysaccharide-23 04/28/2007, 12/14/2016, 06/04/2022, 03/24/2023   Tdap 12/14/2016   Zoster Recombinant(Shingrix) 12/15/2016, 06/16/2017   Zoster, Live 06/19/2010    Past Medical History:  Diagnosis Date   Allergic conjunctivitis    Anemia, iron deficiency    Arthritis    Asthma    Chest tightness or pressure    Presumed microvascular angina,, 2004 normal coronary arteries / nuclear December, 2008, normal   Complication of anesthesia    Coronary artery disease    Depression with anxiety    Diverticulosis of colon    DJD (degenerative joint disease)    Ejection fraction    70%, catheter 2004 / normal LV function nuclear, 2008   Essential hypertension    Fatigue    GERD (gastroesophageal reflux disease)    HLA B27 positive    HLD (hyperlipidemia)    Microvascular angina (HCC)    a. h/o such - cor CT 03/2020 with minimal CAD, consider noncardiac causes of pain.   Palpitations    Pneumonia    PONV (postoperative nausea and vomiting)    Pre-diabetes    Sleep apnea    Thyroid  nodule    The patient had a thyroid  nodule that was surgically removed in the past.  She has one half of her thyroid  and does not take replacement of Taxol and he was benign    Tobacco History: Social History  Tobacco Use  Smoking Status Never   Passive exposure: Past  Smokeless Tobacco Never   Counseling given: Not Answered   Outpatient Medications Prior to Visit  Medication Sig Dispense Refill   ALPRAZolam  (XANAX ) 0.25 MG tablet TAKE 1 TABLET(0.25 MG) BY MOUTH TWICE DAILY AS NEEDED 35 tablet 3   aspirin  EC 81 MG tablet Take 1 tablet (81 mg total) by mouth daily.     azelastine  (ASTELIN ) 0.1 % nasal spray Place 1 spray into both nostrils 2 (two) times daily as needed. Use in each nostril as directed 30 mL 5   BENFOTIAMINE PO  Take 300 mcg by mouth daily.     cholecalciferol (VITAMIN D3) 25 MCG (1000 UNIT) tablet Take 2,000 Units by mouth daily.     ciclopirox  (LOPROX ) 0.77 % cream Apply topically 2 (two) times daily. (Patient taking differently: Apply 1 Application topically 2 (two) times daily as needed (fungus).) 90 g 2   CITRULLINE PO Take 750 mg by mouth 2 (two) times daily.     clobetasol  ointment (TEMOVATE ) 0.05 % Apply 1 application topically 2 (two) times daily. (Patient taking differently: Apply 1 application  topically 2 (two) times a week.) 30 g 2   Coenzyme Q10 (COQ10) 100 MG CAPS Take 100 mg by mouth daily.     conjugated estrogens (PREMARIN) vaginal cream Place 1 Applicatorful vaginally 2 (two) times a week.      cyanocobalamin (VITAMIN B12) 1000 MCG tablet Take 1,000 mcg by mouth daily.     desonide  (DESOWEN ) 0.05 % cream Apply topically 2 (two) times daily. (Patient taking differently: Apply 1 Application topically 2 (two) times daily as needed (itching).) 60 g 2   diltiazem  (CARDIZEM  CD) 180 MG 24 hr capsule Take 1 capsule (180 mg total) by mouth daily. 90 capsule 3   famotidine  (PEPCID ) 40 MG tablet TAKE 1 TABLET(40 MG) BY MOUTH DAILY 90 tablet 1   fish oil-omega-3 fatty acids 1000 MG capsule Take 3 g by mouth daily.     fluticasone  (FLONASE ) 50 MCG/ACT nasal spray Place 2 sprays into both nostrils daily. (Patient taking differently: Place 2 sprays into both nostrils as needed for allergies.) 16 g 5   L-Theanine 200 MG CAPS Take 200 mg by mouth daily.     meclizine  (ANTIVERT ) 25 MG tablet Take 25 mg by mouth 3 (three) times daily as needed for dizziness.     melatonin 5 MG TABS Take 5 mg by mouth at bedtime.     methocarbamol  (ROBAXIN ) 500 MG tablet Take 1 tablet (500 mg total) by mouth every 6 (six) hours as needed for muscle spasms. 90 tablet 2   Multiple Vitamin (MULTIVITAMIN WITH MINERALS) TABS tablet Take 1 tablet by mouth daily.     pravastatin  (PRAVACHOL ) 40 MG tablet Take 1 tablet (40 mg total)  by mouth daily. 90 tablet 1   solifenacin  (VESICARE ) 5 MG tablet TAKE 1 TABLET(5 MG) BY MOUTH DAILY. 90 tablet 0   Suvorexant  (BELSOMRA ) 15 MG TABS TAKE 1 TABLET BY MOUTH AT BEDTIME AS NEEDED 90 tablet 0   telmisartan  (MICARDIS ) 80 MG tablet TAKE 1 TABLET(80 MG) BY MOUTH DAILY 90 tablet 1   No facility-administered medications prior to visit.     Review of Systems:   Constitutional:   No  weight loss, night sweats,  Fevers, chills, fatigue, or  lassitude.  HEENT:   No headaches,  Difficulty swallowing,  Tooth/dental problems, or  Sore throat,  No sneezing, itching, ear ache, nasal congestion, post nasal drip,   CV:  No chest pain,  Orthopnea, PND, swelling in lower extremities, anasarca, dizziness, palpitations, syncope.   GI  No heartburn, indigestion, abdominal pain, nausea, vomiting, diarrhea, change in bowel habits, loss of appetite, bloody stools.   Resp: No shortness of breath with exertion or at rest.  No excess mucus, no productive cough,  No non-productive cough,  No coughing up of blood.  No change in color of mucus.  No wheezing.  No chest wall deformity  Skin: no rash or lesions.  GU: no dysuria, change in color of urine, no urgency or frequency.  No flank pain, no hematuria   MS:  No joint pain or swelling.  No decreased range of motion.  No back pain.    Physical Exam  BP 138/70 (BP Location: Left Arm, Patient Position: Sitting, Cuff Size: Large)   Pulse 81   Ht 5' 4 (1.626 m)   Wt 188 lb 3.2 oz (85.4 kg)   SpO2 96%   BMI 32.30 kg/m   GEN: A/Ox3; pleasant , NAD, well nourished    HEENT:  [place]/AT,   NOSE-clear, THROAT-clear, no lesions, no postnasal drip or exudate noted.   NECK:  Supple w/ fair ROM; no JVD; normal carotid impulses w/o bruits; no thyromegaly or nodules palpated; no lymphadenopathy.    RESP  Clear  P & A; w/o, wheezes/ rales/ or rhonchi. no accessory muscle use, no dullness to percussion  CARD:  RRR, no m/r/g, no peripheral  edema, pulses intact, no cyanosis or clubbing.  GI:   Soft & nt; nml bowel sounds; no organomegaly or masses detected.   Musco: Warm bil, no deformities or joint swelling noted.   Neuro: alert, no focal deficits noted.    Skin: Warm, no lesions or rashes    Lab Results:  CBC  No results found for: BNP  ProBNP   Imaging: No results found.  Administration History     None           No data to display          No results found for: NITRICOXIDE      Assessment & Plan:   OSA (obstructive sleep apnea) Excellent control compliance on nocturnal CPAP.  Continue on current settings  Plan  Patient Instructions  Continue on CPAP At bedtime.  Keep up good work .  Stay active , work on weight loss  Do not drive if sleepy  Follow up Dr. Jude  In 1 year and As needed        Madelin Stank, NP 03/08/2024

## 2024-03-09 DIAGNOSIS — M48062 Spinal stenosis, lumbar region with neurogenic claudication: Secondary | ICD-10-CM | POA: Diagnosis not present

## 2024-03-13 DIAGNOSIS — M48062 Spinal stenosis, lumbar region with neurogenic claudication: Secondary | ICD-10-CM | POA: Diagnosis not present

## 2024-03-15 DIAGNOSIS — M48062 Spinal stenosis, lumbar region with neurogenic claudication: Secondary | ICD-10-CM | POA: Diagnosis not present

## 2024-03-21 DIAGNOSIS — M48062 Spinal stenosis, lumbar region with neurogenic claudication: Secondary | ICD-10-CM | POA: Diagnosis not present

## 2024-03-23 DIAGNOSIS — M48062 Spinal stenosis, lumbar region with neurogenic claudication: Secondary | ICD-10-CM | POA: Diagnosis not present

## 2024-03-27 DIAGNOSIS — L821 Other seborrheic keratosis: Secondary | ICD-10-CM | POA: Diagnosis not present

## 2024-03-27 DIAGNOSIS — L281 Prurigo nodularis: Secondary | ICD-10-CM | POA: Diagnosis not present

## 2024-03-27 DIAGNOSIS — D485 Neoplasm of uncertain behavior of skin: Secondary | ICD-10-CM | POA: Diagnosis not present

## 2024-03-27 DIAGNOSIS — I872 Venous insufficiency (chronic) (peripheral): Secondary | ICD-10-CM | POA: Diagnosis not present

## 2024-03-27 DIAGNOSIS — L820 Inflamed seborrheic keratosis: Secondary | ICD-10-CM | POA: Diagnosis not present

## 2024-03-27 DIAGNOSIS — I8311 Varicose veins of right lower extremity with inflammation: Secondary | ICD-10-CM | POA: Diagnosis not present

## 2024-03-27 DIAGNOSIS — I8312 Varicose veins of left lower extremity with inflammation: Secondary | ICD-10-CM | POA: Diagnosis not present

## 2024-03-27 DIAGNOSIS — L720 Epidermal cyst: Secondary | ICD-10-CM | POA: Diagnosis not present

## 2024-03-27 DIAGNOSIS — D1801 Hemangioma of skin and subcutaneous tissue: Secondary | ICD-10-CM | POA: Diagnosis not present

## 2024-03-28 DIAGNOSIS — Z1231 Encounter for screening mammogram for malignant neoplasm of breast: Secondary | ICD-10-CM | POA: Diagnosis not present

## 2024-03-28 LAB — HM MAMMOGRAPHY

## 2024-03-29 DIAGNOSIS — M48062 Spinal stenosis, lumbar region with neurogenic claudication: Secondary | ICD-10-CM | POA: Diagnosis not present

## 2024-03-30 ENCOUNTER — Ambulatory Visit: Payer: Medicare PPO | Admitting: Internal Medicine

## 2024-03-30 ENCOUNTER — Encounter: Payer: Self-pay | Admitting: Internal Medicine

## 2024-03-30 DIAGNOSIS — H0100A Unspecified blepharitis right eye, upper and lower eyelids: Secondary | ICD-10-CM | POA: Diagnosis not present

## 2024-03-30 DIAGNOSIS — H04123 Dry eye syndrome of bilateral lacrimal glands: Secondary | ICD-10-CM | POA: Diagnosis not present

## 2024-03-30 DIAGNOSIS — H52203 Unspecified astigmatism, bilateral: Secondary | ICD-10-CM | POA: Diagnosis not present

## 2024-03-30 DIAGNOSIS — H0100B Unspecified blepharitis left eye, upper and lower eyelids: Secondary | ICD-10-CM | POA: Diagnosis not present

## 2024-03-30 DIAGNOSIS — H43813 Vitreous degeneration, bilateral: Secondary | ICD-10-CM | POA: Diagnosis not present

## 2024-03-30 DIAGNOSIS — H26491 Other secondary cataract, right eye: Secondary | ICD-10-CM | POA: Diagnosis not present

## 2024-03-31 ENCOUNTER — Ambulatory Visit

## 2024-03-31 VITALS — Ht 64.0 in | Wt 183.0 lb

## 2024-03-31 DIAGNOSIS — Z0000 Encounter for general adult medical examination without abnormal findings: Secondary | ICD-10-CM

## 2024-03-31 DIAGNOSIS — M48062 Spinal stenosis, lumbar region with neurogenic claudication: Secondary | ICD-10-CM | POA: Diagnosis not present

## 2024-03-31 NOTE — Patient Instructions (Addendum)
 Ms. Buckingham , Thank you for taking time out of your busy schedule to complete your Annual Wellness Visit with me. I enjoyed our conversation and look forward to speaking with you again next year. I, as well as your care team,  appreciate your ongoing commitment to your health goals. Please review the following plan we discussed and let me know if I can assist you in the future. Your Game plan/ To Do List    Follow up Visits: Next Medicare AWV with our clinical staff: 04/03/2025   Have you seen your provider in the last 6 months (3 months if uncontrolled diabetes)? Yes Next Office Visit with your provider: 04/03/2024  Clinician Recommendations:  Aim for 30 minutes of exercise or brisk walking, 6-8 glasses of water, and 5 servings of fruits and vegetables each day.       This is a list of the screening recommended for you and due dates:  Health Maintenance  Topic Date Due   Flu Shot  04/07/2024   Medicare Annual Wellness Visit  03/31/2025   Cologuard (Stool DNA test)  04/04/2026   DTaP/Tdap/Td vaccine (3 - Td or Tdap) 12/15/2026   Pneumococcal Vaccine for age over 34  Completed   DEXA scan (bone density measurement)  Completed   Hepatitis C Screening  Completed   Zoster (Shingles) Vaccine  Completed   Hepatitis B Vaccine  Aged Out   HPV Vaccine  Aged Out   Meningitis B Vaccine  Aged Out   Colon Cancer Screening  Discontinued   COVID-19 Vaccine  Discontinued    Advanced directives: (Copy Requested) Please bring a copy of your health care power of attorney and living will to the office to be added to your chart at your convenience. You can mail to [facility] [address] [place], [place] [address] or email to [email or web address] Advance Care Planning is important because it:  [x]  Makes sure you receive the medical care that is consistent with your values, goals, and preferences  [x]  It provides guidance to your family and loved ones and reduces their  decisional burden about whether or not they are making the right decisions based on your wishes.  Follow the link provided in your after visit summary or read over the paperwork we have mailed to you to help you started getting your Advance Directives in place. If you need assistance in completing these, please reach out to us  so that we can help you!

## 2024-03-31 NOTE — Progress Notes (Signed)
 Subjective:   Kayla Khan is a 73 y.o. who presents for a Medicare Wellness preventive visit.  As a reminder, Annual Wellness Visits don't include a physical exam, and some assessments may be limited, especially if this visit is performed virtually. We may recommend an in-person follow-up visit with your provider if needed.  Visit Complete: Virtual I connected with  Kayla Khan on 03/31/24 by a audio enabled telemedicine application and verified that I am speaking with the correct person using two identifiers.  Patient Location: Home  Provider Location: Office/Clinic  I discussed the limitations of evaluation and management by telemedicine. The patient expressed understanding and agreed to proceed.  Vital Signs: Because this visit was a virtual/telehealth visit, some criteria may be missing or patient reported. Any vitals not documented were not able to be obtained and vitals that have been documented are patient reported.  VideoDeclined- This patient declined Librarian, academic. Therefore the visit was completed with audio only.  Persons Participating in Visit: Patient.  AWV Questionnaire: Yes: Patient Medicare AWV questionnaire was completed by the patient on 03/29/2024; I have confirmed that all information answered by patient is correct and no changes since this date.  Cardiac Risk Factors include: advanced age (>42men, >73 women);dyslipidemia;hypertension;obesity (BMI >30kg/m2)     Objective:    Today's Vitals   03/31/24 1014  Weight: 183 lb (83 kg)  Height: 5' 4 (1.626 m)   Body mass index is 31.41 kg/m.     03/31/2024   10:15 AM 11/25/2023    2:24 PM 10/18/2023   10:03 AM 07/06/2022    2:11 PM 07/15/2021    9:17 AM 03/28/2021    4:14 PM 11/22/2020   10:14 AM  Advanced Directives  Does Patient Have a Medical Advance Directive? Yes Yes Yes Yes Yes No Yes  Type of Estate agent of [place];Living will Healthcare  Power of [place];Living will Healthcare Power of [place];Living will Healthcare Power of [place];Living will   Living will;Healthcare Power of Attorney  Does patient want to make changes to medical advance directive?  No - Patient declined No - Patient declined No - Patient declined   No - Patient declined  Copy of Healthcare Power of Attorney in Chart? No - copy requested No - copy requested No - copy requested No - copy requested   No - copy requested  Would patient like information on creating a medical advance directive?      No - Patient declined     Current Medications (verified) Outpatient Encounter Medications as of 03/31/2024  Medication Sig   ALPRAZolam  (XANAX ) 0.25 MG tablet TAKE 1 TABLET(0.25 MG) BY MOUTH TWICE DAILY AS NEEDED   aspirin  EC 81 MG tablet Take 1 tablet (81 mg total) by mouth daily.   azelastine  (ASTELIN ) 0.1 % nasal spray Place 1 spray into both nostrils 2 (two) times daily as needed. Use in each nostril as directed   BENFOTIAMINE PO Take 300 mcg by mouth daily.   cholecalciferol (VITAMIN D3) 25 MCG (1000 UNIT) tablet Take 2,000 Units by mouth daily.   ciclopirox  (LOPROX ) 0.77 % cream Apply topically 2 (two) times daily.   CITRULLINE PO Take 750 mg by mouth 2 (two) times daily.   clobetasol  ointment (TEMOVATE ) 0.05 % Apply 1 application topically 2 (two) times daily.   Coenzyme Q10 (COQ10) 100 MG CAPS Take 100 mg by mouth daily.   conjugated estrogens (PREMARIN) vaginal cream Place 1 Applicatorful vaginally 2 (two) times a  week.    cyanocobalamin (VITAMIN B12) 1000 MCG tablet Take 1,000 mcg by mouth daily.   desonide  (DESOWEN ) 0.05 % cream Apply topically 2 (two) times daily. (Patient taking differently: Apply 1 Application topically 2 (two) times daily as needed (itching).)   diltiazem  (CARDIZEM  CD) 180 MG 24 hr capsule Take 1 capsule (180 mg total) by mouth daily.   famotidine  (PEPCID ) 40 MG tablet TAKE 1 TABLET(40 MG) BY MOUTH DAILY   fish oil-omega-3 fatty acids  1000 MG capsule Take 3 g by mouth daily.   fluticasone  (FLONASE ) 50 MCG/ACT nasal spray Place 2 sprays into both nostrils daily. (Patient taking differently: Place 2 sprays into both nostrils as needed for allergies.)   L-Theanine 200 MG CAPS Take 200 mg by mouth daily.   meclizine  (ANTIVERT ) 25 MG tablet Take 25 mg by mouth 3 (three) times daily as needed for dizziness.   melatonin 5 MG TABS Take 5 mg by mouth at bedtime.   methocarbamol  (ROBAXIN ) 500 MG tablet Take 1 tablet (500 mg total) by mouth every 6 (six) hours as needed for muscle spasms.   Multiple Vitamin (MULTIVITAMIN WITH MINERALS) TABS tablet Take 1 tablet by mouth daily.   pravastatin  (PRAVACHOL ) 40 MG tablet Take 1 tablet (40 mg total) by mouth daily.   solifenacin  (VESICARE ) 5 MG tablet TAKE 1 TABLET(5 MG) BY MOUTH DAILY.   Suvorexant  (BELSOMRA ) 15 MG TABS TAKE 1 TABLET BY MOUTH AT BEDTIME AS NEEDED   telmisartan  (MICARDIS ) 80 MG tablet TAKE 1 TABLET(80 MG) BY MOUTH DAILY   No facility-administered encounter medications on file as of 03/31/2024.    Allergies (verified) Beta adrenergic blockers, Crestor  [rosuvastatin ], Indapamide , Latex, Paxil  [paroxetine ], Amlodipine , Amoxicillin-pot clavulanate, Hctz [hydrochlorothiazide ], and Meperidine hcl   History: Past Medical History:  Diagnosis Date   Allergic conjunctivitis    Anemia, iron deficiency    Arthritis    Asthma    Chest tightness or pressure    Presumed microvascular angina,, 2004 normal coronary arteries / nuclear December, 2008, normal   Complication of anesthesia    Coronary artery disease    Depression with anxiety    Diverticulosis of colon    DJD (degenerative joint disease)    Ejection fraction    70%, catheter 2004 / normal LV function nuclear, 2008   Essential hypertension    Fatigue    GERD (gastroesophageal reflux disease)    HLA B27 positive    HLD (hyperlipidemia)    Microvascular angina (HCC)    a. h/o such - cor CT 03/2020 with minimal CAD,  consider noncardiac causes of pain.   Palpitations    Pneumonia    PONV (postoperative nausea and vomiting)    Pre-diabetes    Sleep apnea    Thyroid  nodule    The patient had a thyroid  nodule that was surgically removed in the past.  She has one half of her thyroid  and does not take replacement of Taxol and he was benign   Past Surgical History:  Procedure Laterality Date   APPENDECTOMY     BREAST BIOPSY     CESAREAN SECTION     x2   FORAMINOTOMY 1 LEVEL N/A 12/07/2023   Procedure: OPEN LUMBAR LAMINECTOMY AND MEDIAL FACETECTOMY LUMBAR FOUR-LUMBAR FIVE;  Surgeon: Carollee Lani BROCKS, DO;  Location: [facility] OR;  Service: Neurosurgery;  Laterality: N/A;   GANGLION CYST EXCISION     THYROIDECTOMY, PARTIAL     left   TUBAL LIGATION     VESICOVAGINAL FISTULA CLOSURE W/  TAH     Family History  Problem Relation Age of Onset   Cancer Mother    Hypertension Father    Aneurysm Father 55       cerebral hemorrhage   Cancer Brother    Stroke Maternal Grandmother    Heart attack Maternal Grandfather    Stroke Paternal Grandfather    Diabetes Other    Heart disease Other    Cancer Other    Arthritis Other    Hypertension Other    Aneurysm Other    Colon cancer Son    Social History   Socioeconomic History   Marital status: Divorced    Spouse name: Not on file   Number of children: 1   Years of education: Not on file   Highest education level: Bachelor's degree (e.g., BA, AB, BS)  Occupational History   Occupation: Runner, broadcasting/film/video  Tobacco Use   Smoking status: Never    Passive exposure: Past   Smokeless tobacco: Never  Vaping Use   Vaping status: Never Used  Substance and Sexual Activity   Alcohol use: No   Drug use: No   Sexual activity: Not Currently  Other Topics Concern   Not on file  Social History Narrative   Not on file   Social Drivers of Health   Financial Resource Strain: Low Risk  (03/31/2024)   Overall Financial Resource Strain (CARDIA)    Difficulty of Paying Living  Expenses: Not hard at all  Food Insecurity: No Food Insecurity (03/31/2024)   Hunger Vital Sign    Worried About Running Out of Food in the Last Year: Never true    Ran Out of Food in the Last Year: Never true  Transportation Needs: No Transportation Needs (03/31/2024)   PRAPARE - Administrator, Civil Service (Medical): No    Lack of Transportation (Non-Medical): No  Physical Activity: Insufficiently Active (03/31/2024)   Exercise Vital Sign    Days of Exercise per Week: 2 days    Minutes of Exercise per Session: 40 min  Stress: No Stress Concern Present (03/31/2024)   [employer] of Occupational Health - Occupational Stress Questionnaire    Feeling of Stress: Only a little  Social Connections: Moderately Integrated (03/31/2024)   Social Connection and Isolation Panel    Frequency of Communication with Friends and Family: More than three times a week    Frequency of Social Gatherings with Friends and Family: Three times a week    Attends Religious Services: More than 4 times per year    Active Member of Clubs or Organizations: Yes    Attends Engineer, structural: More than 4 times per year    Marital Status: Divorced    Tobacco Counseling Counseling given: No    Clinical Intake:  Pre-visit preparation completed: Yes  Pain : No/denies pain     BMI - recorded: 31.41 Nutritional Status: BMI > 30  Obese Nutritional Risks: None Diabetes: No  Lab Results  Component Value Date   HGBA1C 5.7 (H) 11/25/2023   HGBA1C 6.2 10/01/2023   HGBA1C 5.9 03/24/2023     How often do you need to have someone help you when you read instructions, pamphlets, or other written materials from your doctor or pharmacy?: 1 - Never  Interpreter Needed?: No  Information entered by :: Verdie Saba, CMA   Activities of Daily Living     03/31/2024   10:17 AM 03/29/2024   11:18 PM  In your present state of health,  do you have any difficulty performing the following  activities:  Hearing? 0 0  Vision? 0 0  Difficulty concentrating or making decisions? 0 0  Walking or climbing stairs? 0 0  Dressing or bathing? 0 0  Doing errands, shopping? 0 0  Preparing Food and eating ? N N  Using the Toilet? N N  In the past six months, have you accidently leaked urine? Kayla Khan Kayla Khan  Comment wears a pad   Do you have problems with loss of bowel control? N N  Managing your Medications? N N  Managing your Finances? N N  Housekeeping or managing your Housekeeping? N N    Patient Care Team: Joshua Debby CROME, MD as PCP - General Santo Stanly LABOR, MD as PCP - Cardiology (Cardiology) Patrcia Sharper, MD as Consulting Physician (Ophthalmology) Porter Andrez SAUNDERS, PA-C as Physician Assistant (Dermatology)  I have updated your Care Teams any recent Medical Services you may have received from other providers in the past year.     Assessment:   This is a routine wellness examination for Kayla Khan.  Hearing/Vision screen Hearing Screening - Comments:: Denies hearing difficulties   Vision Screening - Comments:: Wears rx glasses - up to date with routine eye exams with Dr Patrcia   Goals Addressed               This Visit's Progress     Patient Stated (pt-stated)        Patient stated she is currently doing PT 2x a week for her back post surgery       Depression Screen     03/31/2024   10:19 AM 06/07/2023    1:25 PM 03/24/2023    8:59 AM 11/25/2022    9:02 AM 07/06/2022    2:14 PM 06/04/2022    1:35 PM 11/04/2021   10:51 AM  PHQ 2/9 Scores  PHQ - 2 Score 0 0 1 0 1 0 0  PHQ- 9 Score 0  1 0 3 0     Fall Risk     03/31/2024   10:18 AM 03/29/2024   11:18 PM 08/23/2023   10:22 AM 06/07/2023    1:25 PM 07/06/2022    2:13 PM  Fall Risk   Falls in the past year? 0 0 0 0 0  Number falls in past yr: 0 0 0 0 0  Injury with Fall? 0  0 0 0  Risk for fall due to : No Fall Risks   No Fall Risks History of fall(s);Impaired balance/gait;Impaired mobility  Follow up  Falls evaluation completed;Falls prevention discussed  Falls evaluation completed Falls evaluation completed     MEDICARE RISK AT HOME:  Medicare Risk at Home Any stairs in or around the home?: Yes If so, are there any without handrails?: No Home free of loose throw rugs in walkways, pet beds, electrical cords, etc?: Yes Adequate lighting in your home to reduce risk of falls?: Yes Life alert?: No Use of a cane, walker or w/c?: No Grab bars in the bathroom?: Yes Shower chair or bench in shower?: Yes Elevated toilet seat or a handicapped toilet?: No  TIMED UP AND GO:  Was the test performed?  No  Cognitive Function: 6CIT completed        03/31/2024   10:21 AM 07/06/2022    2:12 PM  6CIT Screen  What Year? 0 points 0 points  What month? 0 points 0 points  What time? 0 points 0 points  Count back from  20 0 points 0 points  Months in reverse 0 points 0 points  Repeat phrase 0 points 0 points  Total Score 0 points 0 points    Immunizations Immunization History  Administered Date(s) Administered   DTaP 10/18/2004   Fluad  Quad(high Dose 65+) 06/04/2022   Fluad  Trivalent(High Dose 65+) 05/18/2023   Influenza Split 07/08/2012, 04/16/2016   Influenza Whole 06/19/2010, 06/30/2011   Influenza, High Dose Seasonal PF 04/24/2017, 06/06/2018, 04/26/2019, 05/07/2020   Influenza,inj,Quad PF,6+ Mos 07/03/2013   Influenza-Unspecified 06/07/2014, 05/24/2017   PFIZER(Purple Top)SARS-COV-2 Vaccination 10/13/2019, 11/07/2019, 06/03/2020   Pfizer(Comirnaty )Fall Seasonal Vaccine 12 years and older 05/18/2023   Pneumococcal Conjugate-13 03/22/2013   Pneumococcal Polysaccharide-23 04/28/2007, 12/14/2016, 06/04/2022, 03/24/2023   Tdap 12/14/2016   Zoster Recombinant(Shingrix) 12/15/2016, 06/16/2017   Zoster, Live 06/19/2010    Screening Tests Health Maintenance  Topic Date Due   INFLUENZA VACCINE  04/07/2024   Medicare Annual Wellness (AWV)  03/31/2025   Fecal DNA (Cologuard)   04/04/2026   DTaP/Tdap/Td (3 - Td or Tdap) 12/15/2026   Pneumococcal Vaccine: 50+ Years  Completed   DEXA SCAN  Completed   Hepatitis C Screening  Completed   Zoster Vaccines- Shingrix  Completed   Hepatitis B Vaccines  Aged Out   HPV VACCINES  Aged Out   Meningococcal B Vaccine  Aged Out   Colonoscopy  Discontinued   COVID-19 Vaccine  Discontinued    Health Maintenance  There are no preventive care reminders to display for this patient. Health Maintenance Items Addressed: 03/31/2024  Additional Screening:  Vision Screening: Recommended annual ophthalmology exams for early detection of glaucoma and other disorders of the eye. Would you like a referral to an eye doctor? No    Dental Screening: Recommended annual dental exams for proper oral hygiene  Community Resource Referral / Chronic Care Management: CRR required this visit?  No   CCM required this visit?  No   Plan:    I have personally reviewed and noted the following in the patient's chart:   Medical and social history Use of alcohol, tobacco or illicit drugs  Current medications and supplements including opioid prescriptions. Patient is not currently taking opioid prescriptions. Functional ability and status Nutritional status Physical activity Advanced directives List of other physicians Hospitalizations, surgeries, and ER visits in previous 12 months Vitals Screenings to include cognitive, depression, and falls Referrals and appointments  In addition, I have reviewed and discussed with patient certain preventive protocols, quality metrics, and best practice recommendations. A written personalized care plan for preventive services as well as general preventive health recommendations were provided to patient.   Verdie CHRISTELLA Saba, CMA   03/31/2024   After Visit Summary: (MyChart) Due to this being a telephonic visit, the after visit summary with patients personalized plan was offered to patient via MyChart    Notes: Nothing significant to report at this time.

## 2024-04-03 ENCOUNTER — Encounter: Payer: Self-pay | Admitting: Internal Medicine

## 2024-04-03 ENCOUNTER — Ambulatory Visit (INDEPENDENT_AMBULATORY_CARE_PROVIDER_SITE_OTHER): Payer: Medicare PPO | Admitting: Internal Medicine

## 2024-04-03 VITALS — BP 128/76 | HR 71 | Temp 98.7°F | Resp 16 | Ht 64.0 in | Wt 188.0 lb

## 2024-04-03 DIAGNOSIS — M48062 Spinal stenosis, lumbar region with neurogenic claudication: Secondary | ICD-10-CM | POA: Diagnosis not present

## 2024-04-03 DIAGNOSIS — N3281 Overactive bladder: Secondary | ICD-10-CM | POA: Diagnosis not present

## 2024-04-03 DIAGNOSIS — N1831 Chronic kidney disease, stage 3a: Secondary | ICD-10-CM

## 2024-04-03 DIAGNOSIS — E785 Hyperlipidemia, unspecified: Secondary | ICD-10-CM

## 2024-04-03 DIAGNOSIS — D508 Other iron deficiency anemias: Secondary | ICD-10-CM

## 2024-04-03 DIAGNOSIS — I10 Essential (primary) hypertension: Secondary | ICD-10-CM | POA: Diagnosis not present

## 2024-04-03 DIAGNOSIS — F5104 Psychophysiologic insomnia: Secondary | ICD-10-CM | POA: Diagnosis not present

## 2024-04-03 DIAGNOSIS — R739 Hyperglycemia, unspecified: Secondary | ICD-10-CM | POA: Diagnosis not present

## 2024-04-03 DIAGNOSIS — K2100 Gastro-esophageal reflux disease with esophagitis, without bleeding: Secondary | ICD-10-CM | POA: Diagnosis not present

## 2024-04-03 DIAGNOSIS — H9313 Tinnitus, bilateral: Secondary | ICD-10-CM | POA: Insufficient documentation

## 2024-04-03 DIAGNOSIS — R600 Localized edema: Secondary | ICD-10-CM | POA: Diagnosis not present

## 2024-04-03 DIAGNOSIS — Z0001 Encounter for general adult medical examination with abnormal findings: Secondary | ICD-10-CM

## 2024-04-03 LAB — CBC WITH DIFFERENTIAL/PLATELET
Basophils Absolute: 0 K/uL (ref 0.0–0.1)
Basophils Relative: 0.7 % (ref 0.0–3.0)
Eosinophils Absolute: 0.1 K/uL (ref 0.0–0.7)
Eosinophils Relative: 2 % (ref 0.0–5.0)
HCT: 38.6 % (ref 36.0–46.0)
Hemoglobin: 12.9 g/dL (ref 12.0–15.0)
Lymphocytes Relative: 23.2 % (ref 12.0–46.0)
Lymphs Abs: 1.5 K/uL (ref 0.7–4.0)
MCHC: 33.5 g/dL (ref 30.0–36.0)
MCV: 95.1 fl (ref 78.0–100.0)
Monocytes Absolute: 0.5 K/uL (ref 0.1–1.0)
Monocytes Relative: 7.5 % (ref 3.0–12.0)
Neutro Abs: 4.4 K/uL (ref 1.4–7.7)
Neutrophils Relative %: 66.6 % (ref 43.0–77.0)
Platelets: 226 K/uL (ref 150.0–400.0)
RBC: 4.06 Mil/uL (ref 3.87–5.11)
RDW: 13.2 % (ref 11.5–15.5)
WBC: 6.6 K/uL (ref 4.0–10.5)

## 2024-04-03 LAB — BASIC METABOLIC PANEL WITH GFR
BUN: 29 mg/dL — ABNORMAL HIGH (ref 6–23)
CO2: 27 meq/L (ref 19–32)
Calcium: 9.5 mg/dL (ref 8.4–10.5)
Chloride: 107 meq/L (ref 96–112)
Creatinine, Ser: 1.12 mg/dL (ref 0.40–1.20)
GFR: 48.1 mL/min — ABNORMAL LOW (ref >60.00)
Glucose, Bld: 115 mg/dL — ABNORMAL HIGH (ref 70–99)
Potassium: 4.7 meq/L (ref 3.5–5.1)
Sodium: 139 meq/L (ref 135–145)

## 2024-04-03 LAB — HEPATIC FUNCTION PANEL
ALT: 18 U/L (ref 0–35)
AST: 16 U/L (ref 0–37)
Albumin: 4.3 g/dL (ref 3.5–5.2)
Alkaline Phosphatase: 64 U/L (ref 39–117)
Bilirubin, Direct: 0.1 mg/dL (ref 0.0–0.3)
Total Bilirubin: 0.5 mg/dL (ref 0.2–1.2)
Total Protein: 7.2 g/dL (ref 6.0–8.3)

## 2024-04-03 LAB — D-DIMER, QUANTITATIVE: D-Dimer, Quant: 0.45 ug{FEU}/mL (ref <0.50)

## 2024-04-03 LAB — HEMOGLOBIN A1C: Hgb A1c MFr Bld: 6.1 % (ref 4.6–6.5)

## 2024-04-03 MED ORDER — PRAVASTATIN SODIUM 40 MG PO TABS: 40.0000 mg | ORAL_TABLET | Freq: Every day | ORAL | 1 refills | Status: DC

## 2024-04-03 MED ORDER — SOLIFENACIN SUCCINATE 5 MG PO TABS: 5.0000 mg | ORAL_TABLET | Freq: Every day | ORAL | 1 refills | Status: DC

## 2024-04-03 MED ORDER — BELSOMRA 15 MG PO TABS: 1.0000 | ORAL_TABLET | Freq: Every evening | ORAL | 1 refills | Status: DC | PRN

## 2024-04-03 MED ORDER — TELMISARTAN 80 MG PO TABS: 80.0000 mg | ORAL_TABLET | Freq: Every day | ORAL | 1 refills | Status: DC

## 2024-04-03 MED ORDER — FAMOTIDINE 40 MG PO TABS: 40.0000 mg | ORAL_TABLET | Freq: Every day | ORAL | 1 refills | Status: DC

## 2024-04-03 NOTE — Patient Instructions (Signed)

## 2024-04-03 NOTE — Progress Notes (Signed)
 Subjective:  Patient ID: Kayla Khan, female    DOB: April 10, 1951  Age: 73 y.o. MRN: [id number]  CC: Annual Exam, Hypertension, Anemia, and Hyperlipidemia   HPI Alvin L Fetterly presents for a CPX and f/up ----  Discussed the use of AI scribe software for clinical note transcription with the patient, who gave verbal consent to proceed.  History of Present Illness Kayla Khan is a 73 year old female who presents for follow-up on her back pain and other health concerns.  She underwent back surgery on April 1st, which resolved some issues, and she is currently undergoing physical therapy. She experiences pain when bending over for activities like laundry and gardening, but there is no radiating pain into her legs. She occasionally takes acetaminophen  and has muscle relaxants available at home.  Her blood pressure medication was recently adjusted, and she is tolerating it well. No recent episodes of angina, chest pain, or shortness of breath.  She takes solifenacin  every other day for urinary frequency, which helps but causes dehydration. No painful urination, blood in urine, or hesitancy.  She notes swelling in her left ankle, which began when she was on a different medication. She is unsure if it is related to her current medication, diltiazem , and reports that the swelling is not painful.  She experiences hearing loss and tinnitus, described as a high-pitched ringing, and uses captions on the TV due to difficulty hearing certain voices.    Outpatient Medications Prior to Visit  Medication Sig Dispense Refill   ALPRAZolam  (XANAX ) 0.25 MG tablet TAKE 1 TABLET(0.25 MG) BY MOUTH TWICE DAILY AS NEEDED 35 tablet 3   aspirin  EC 81 MG tablet Take 1 tablet (81 mg total) by mouth daily.     azelastine  (ASTELIN ) 0.1 % nasal spray Place 1 spray into both nostrils 2 (two) times daily as needed. Use in each nostril as directed 30 mL 5   BENFOTIAMINE PO Take 300 mcg by mouth daily.      cholecalciferol (VITAMIN D3) 25 MCG (1000 UNIT) tablet Take 2,000 Units by mouth daily.     ciclopirox  (LOPROX ) 0.77 % cream Apply topically 2 (two) times daily. 90 g 2   CITRULLINE PO Take 750 mg by mouth 2 (two) times daily.     clobetasol  ointment (TEMOVATE ) 0.05 % Apply 1 application topically 2 (two) times daily. 30 g 2   Coenzyme Q10 (COQ10) 100 MG CAPS Take 100 mg by mouth daily.     conjugated estrogens (PREMARIN) vaginal cream Place 1 Applicatorful vaginally 2 (two) times a week.      cyanocobalamin (VITAMIN B12) 1000 MCG tablet Take 1,000 mcg by mouth daily.     desonide  (DESOWEN ) 0.05 % cream Apply topically 2 (two) times daily. (Patient taking differently: Apply 1 Application topically 2 (two) times daily as needed (itching).) 60 g 2   diltiazem  (CARDIZEM  CD) 180 MG 24 hr capsule Take 1 capsule (180 mg total) by mouth daily. 90 capsule 3   fish oil-omega-3 fatty acids 1000 MG capsule Take 3 g by mouth daily.     fluticasone  (FLONASE ) 50 MCG/ACT nasal spray Place 2 sprays into both nostrils daily. (Patient taking differently: Place 2 sprays into both nostrils as needed for allergies.) 16 g 5   L-Theanine 200 MG CAPS Take 200 mg by mouth daily.     meclizine  (ANTIVERT ) 25 MG tablet Take 25 mg by mouth 3 (three) times daily as needed for dizziness.     melatonin 5 MG  TABS Take 5 mg by mouth at bedtime.     methocarbamol  (ROBAXIN ) 500 MG tablet Take 1 tablet (500 mg total) by mouth every 6 (six) hours as needed for muscle spasms. 90 tablet 2   Multiple Vitamin (MULTIVITAMIN WITH MINERALS) TABS tablet Take 1 tablet by mouth daily.     famotidine  (PEPCID ) 40 MG tablet TAKE 1 TABLET(40 MG) BY MOUTH DAILY 90 tablet 1   pravastatin  (PRAVACHOL ) 40 MG tablet Take 1 tablet (40 mg total) by mouth daily. 90 tablet 1   solifenacin  (VESICARE ) 5 MG tablet TAKE 1 TABLET(5 MG) BY MOUTH DAILY. 90 tablet 0   Suvorexant  (BELSOMRA ) 15 MG TABS TAKE 1 TABLET BY MOUTH AT BEDTIME AS NEEDED 90 tablet 0    telmisartan  (MICARDIS ) 80 MG tablet TAKE 1 TABLET(80 MG) BY MOUTH DAILY 90 tablet 1   No facility-administered medications prior to visit.    ROS Review of Systems  HENT:  Positive for hearing loss and tinnitus.   Respiratory: Negative.  Negative for cough, chest tightness, shortness of breath and wheezing.   Cardiovascular:  Positive for leg swelling. Negative for chest pain and palpitations.  Gastrointestinal: Negative.   Genitourinary: Negative.  Negative for difficulty urinating.  Musculoskeletal: Negative.   Skin: Negative.   Hematological:  Negative for adenopathy. Does not bruise/bleed easily.  Psychiatric/Behavioral: Negative.      Objective:  BP 128/76 (BP Location: Left Arm, Patient Position: Sitting, Cuff Size: Normal)   Pulse 71   Temp 98.7 F (37.1 C) (Oral)   Resp 16   Ht 5' 4 (1.626 m)   Wt 188 lb (85.3 kg)   SpO2 95%   BMI 32.27 kg/m   BP Readings from Last 3 Encounters:  04/03/24 128/76  03/08/24 138/70  03/06/24 131/77    Wt Readings from Last 3 Encounters:  04/03/24 188 lb (85.3 kg)  03/31/24 183 lb (83 kg)  03/08/24 188 lb 3.2 oz (85.4 kg)    Physical Exam Vitals reviewed.  Constitutional:      Appearance: Normal appearance.  HENT:     Nose: Nose normal.     Mouth/Throat:     Mouth: Mucous membranes are moist.  Eyes:     General: No scleral icterus.    Conjunctiva/sclera: Conjunctivae normal.  Cardiovascular:     Rate and Rhythm: Normal rate and regular rhythm.     Heart sounds: Murmur heard.     Systolic murmur is present with a grade of 1/6.     No friction rub. No gallop.  Pulmonary:     Effort: Pulmonary effort is normal.     Breath sounds: No stridor. No wheezing, rhonchi or rales.  Abdominal:     General: Abdomen is flat.     Palpations: There is no mass.     Tenderness: There is no abdominal tenderness. There is no guarding.     Hernia: No hernia is present.  Musculoskeletal:     Cervical back: Neck supple.     Right  lower leg: Edema (trace pitting ankle) present.     Left lower leg: Edema present.  Lymphadenopathy:     Cervical: No cervical adenopathy.  Skin:    General: Skin is warm and dry.  Neurological:     General: No focal deficit present.     Mental Status: She is alert.  Psychiatric:        Mood and Affect: Mood normal.        Behavior: Behavior normal.  Lab Results  Component Value Date   WBC 6.6 04/03/2024   HGB 12.9 04/03/2024   HCT 38.6 04/03/2024   PLT 226.0 04/03/2024   GLUCOSE 115 (H) 04/03/2024   CHOL 127 10/01/2023   TRIG 97.0 10/01/2023   HDL 41.30 10/01/2023   LDLDIRECT 92.0 12/15/2016   LDLCALC 67 10/01/2023   ALT 18 04/03/2024   AST 16 04/03/2024   NA 139 04/03/2024   K 4.7 04/03/2024   CL 107 04/03/2024   CREATININE 1.12 04/03/2024   BUN 29 (H) 04/03/2024   CO2 27 04/03/2024   TSH 1.36 10/01/2023   HGBA1C 6.1 04/03/2024    DG Lumbar Spine 1 View Result Date: 12/07/2023 CLINICAL DATA:  Open lumbar laminectomy and medial facetectomy L4-5. EXAM: LUMBAR SPINE - 1 VIEW COMPARISON:  Lumbar MRI 08/10/2023. FINDINGS: C-arm fluoroscopy was provided in the operating room without the presence of a radiologist.16 seconds fluoroscopy time. 12.34 mGy air kerma. One C-arm fluoroscopic images was obtained intraoperatively and is submitted for post operative interpretation. This image demonstrates a posterior localizing instrument at the L4-5 level. Please see intraoperative findings for further detail. IMPRESSION: Intraoperative fluoroscopic guidance for lumbar surgery. Electronically Signed   By: Elsie Perone M.D.   On: 12/07/2023 13:27   DG C-Arm 1-60 Min-No Report Result Date: 12/07/2023 Fluoroscopy was utilized by the requesting physician.  No radiographic interpretation.    Assessment & Plan:  Iron deficiency anemia secondary to inadequate dietary iron intake -     IBC + Ferritin; Future -     CBC with Differential/Platelet; Future -     Brain natriuretic peptide;  Future  Gastroesophageal reflux disease with esophagitis without hemorrhage -     Famotidine ; Take 1 tablet (40 mg total) by mouth daily.  Dispense: 90 tablet; Refill: 1 -     CBC with Differential/Platelet; Future -     Brain natriuretic peptide; Future  Essential hypertension -     Telmisartan ; Take 1 tablet (80 mg total) by mouth daily.  Dispense: 90 tablet; Refill: 1 -     Basic metabolic panel with GFR; Future -     Urinalysis, Routine w reflex microscopic; Future -     Brain natriuretic peptide; Future  Hyperlipidemia with target LDL less than 130 -     Pravastatin  Sodium; Take 1 tablet (40 mg total) by mouth daily.  Dispense: 90 tablet; Refill: 1 -     Hepatic function panel; Future -     Brain natriuretic peptide; Future  Psychophysiological insomnia -     Belsomra ; Take 1 tablet (15 mg total) by mouth at bedtime as needed.  Dispense: 90 tablet; Refill: 1 -     Brain natriuretic peptide; Future  Bilateral leg edema -     Brain natriuretic peptide; Future -     D-dimer, quantitative; Future -     Brain natriuretic peptide; Future  Tinnitus aurium, bilateral -     Ambulatory referral to ENT -     Brain natriuretic peptide; Future  Chronic hyperglycemia -     Hemoglobin A1c; Future -     Brain natriuretic peptide; Future  Encounter for general adult medical examination with abnormal findings -     Brain natriuretic peptide; Future  OAB (overactive bladder) -     Solifenacin  Succinate; Take 1 tablet (5 mg total) by mouth daily.  Dispense: 90 tablet; Refill: 1 -     Urinalysis, Routine w reflex microscopic; Future -     Brain  natriuretic peptide; Future  Stage 3a chronic kidney disease (HCC) -     US  RENAL; Future     Follow-up: Return in about 6 months (around 10/04/2024).  Debby Molt, MD

## 2024-04-04 ENCOUNTER — Ambulatory Visit: Payer: Self-pay | Admitting: Internal Medicine

## 2024-04-04 ENCOUNTER — Other Ambulatory Visit: Payer: Self-pay | Admitting: Internal Medicine

## 2024-04-04 DIAGNOSIS — I10 Essential (primary) hypertension: Secondary | ICD-10-CM

## 2024-04-04 LAB — URINALYSIS, ROUTINE W REFLEX MICROSCOPIC
Bilirubin Urine: NEGATIVE
Hgb urine dipstick: NEGATIVE
Ketones, ur: NEGATIVE
Leukocytes,Ua: NEGATIVE
Nitrite: NEGATIVE
RBC / HPF: NONE SEEN (ref 0–?)
Specific Gravity, Urine: <=1.005 — AB (ref 1.000–1.030)
Total Protein, Urine: NEGATIVE
Urine Glucose: NEGATIVE
Urobilinogen, UA: 0.2 (ref 0.0–1.0)
WBC, UA: NONE SEEN (ref 0–?)
pH: 6 (ref 5.0–8.0)

## 2024-04-04 LAB — IBC + FERRITIN
Ferritin: 126.3 ng/mL (ref 10.0–291.0)
Iron: 85 ug/dL (ref 42–145)
Saturation Ratios: 30.1 % (ref 20.0–50.0)
TIBC: 282.8 ug/dL (ref 250.0–450.0)
Transferrin: 202 mg/dL — ABNORMAL LOW (ref 212.0–360.0)

## 2024-04-05 ENCOUNTER — Ambulatory Visit
Admission: RE | Admit: 2024-04-05 | Discharge: 2024-04-05 | Disposition: A | Discharge status: Home / Self Care | Source: Ambulatory Visit | Attending: Internal Medicine | Admitting: Internal Medicine

## 2024-04-05 DIAGNOSIS — N189 Chronic kidney disease, unspecified: Secondary | ICD-10-CM | POA: Diagnosis not present

## 2024-04-05 DIAGNOSIS — M48062 Spinal stenosis, lumbar region with neurogenic claudication: Secondary | ICD-10-CM | POA: Diagnosis not present

## 2024-04-05 DIAGNOSIS — N1831 Chronic kidney disease, stage 3a: Secondary | ICD-10-CM

## 2024-04-05 LAB — BRAIN NATRIURETIC PEPTIDE: Brain Natriuretic Peptide: 35 pg/mL (ref <100)

## 2024-04-05 LAB — EXTRA SPECIMEN

## 2024-04-05 MED ORDER — EMPAGLIFLOZIN 10 MG PO TABS: 10.0000 mg | ORAL_TABLET | Freq: Every day | ORAL | 1 refills | Status: DC

## 2024-04-07 DIAGNOSIS — M4726 Other spondylosis with radiculopathy, lumbar region: Secondary | ICD-10-CM | POA: Diagnosis not present

## 2024-04-07 DIAGNOSIS — Z6832 Body mass index (BMI) 32.0-32.9, adult: Secondary | ICD-10-CM | POA: Diagnosis not present

## 2024-04-11 DIAGNOSIS — M48062 Spinal stenosis, lumbar region with neurogenic claudication: Secondary | ICD-10-CM | POA: Diagnosis not present

## 2024-04-13 DIAGNOSIS — M48062 Spinal stenosis, lumbar region with neurogenic claudication: Secondary | ICD-10-CM | POA: Diagnosis not present

## 2024-04-17 DIAGNOSIS — M48062 Spinal stenosis, lumbar region with neurogenic claudication: Secondary | ICD-10-CM | POA: Diagnosis not present

## 2024-04-19 DIAGNOSIS — M48062 Spinal stenosis, lumbar region with neurogenic claudication: Secondary | ICD-10-CM | POA: Diagnosis not present

## 2024-04-25 DIAGNOSIS — M4696 Unspecified inflammatory spondylopathy, lumbar region: Secondary | ICD-10-CM | POA: Diagnosis not present

## 2024-04-25 DIAGNOSIS — M47816 Spondylosis without myelopathy or radiculopathy, lumbar region: Secondary | ICD-10-CM | POA: Diagnosis not present

## 2024-04-26 DIAGNOSIS — M48062 Spinal stenosis, lumbar region with neurogenic claudication: Secondary | ICD-10-CM | POA: Diagnosis not present

## 2024-05-18 DIAGNOSIS — M47816 Spondylosis without myelopathy or radiculopathy, lumbar region: Secondary | ICD-10-CM | POA: Diagnosis not present

## 2024-05-23 ENCOUNTER — Ambulatory Visit (INDEPENDENT_AMBULATORY_CARE_PROVIDER_SITE_OTHER): Admitting: Audiology

## 2024-05-23 ENCOUNTER — Institutional Professional Consult (permissible substitution) (INDEPENDENT_AMBULATORY_CARE_PROVIDER_SITE_OTHER)

## 2024-06-01 DIAGNOSIS — G4733 Obstructive sleep apnea (adult) (pediatric): Secondary | ICD-10-CM | POA: Diagnosis not present

## 2024-06-26 ENCOUNTER — Ambulatory Visit (INDEPENDENT_AMBULATORY_CARE_PROVIDER_SITE_OTHER)

## 2024-06-26 ENCOUNTER — Ambulatory Visit (INDEPENDENT_AMBULATORY_CARE_PROVIDER_SITE_OTHER): Admitting: Audiology

## 2024-06-26 ENCOUNTER — Encounter (INDEPENDENT_AMBULATORY_CARE_PROVIDER_SITE_OTHER): Payer: Self-pay

## 2024-06-26 VITALS — BP 129/71 | HR 67 | Ht 64.0 in | Wt 180.0 lb

## 2024-06-26 DIAGNOSIS — H608X3 Other otitis externa, bilateral: Secondary | ICD-10-CM

## 2024-06-26 DIAGNOSIS — H903 Sensorineural hearing loss, bilateral: Secondary | ICD-10-CM

## 2024-06-26 DIAGNOSIS — H9313 Tinnitus, bilateral: Secondary | ICD-10-CM

## 2024-06-26 MED ORDER — FLUOCINOLONE ACETONIDE 0.01 % OT OIL: 5.0000 [drp] | TOPICAL_OIL | Freq: Two times a day (BID) | OTIC | 0 refills | Status: AC

## 2024-06-26 NOTE — Progress Notes (Unsigned)
  [address] [place], [place] [address] [phone]  Audiological Evaluation    Name: Kayla Khan     DOB:   03-05-1951      MRN:   [id number]                                                                                     Service Date: 06/26/2024     Accompanied by: unaccompanied   Patient comes today after Dr. Penne Croak, ENT sent a referral for a hearing evaluation due to concerns with tinnitus.   Symptoms Yes Details  Hearing loss  [x]  Concerns with hearing loss in the upper range. Difficulty hearing at church and her granddaughter.  Tinnitus  [x]  yes  Ear pain/ infections/pressure  []    Balance problems  [x]  Several years ago was off balance, had physical therapy, but reports that he is still off balance  Noise exposure history  []    Previous ear surgeries  []    Family history of hearing loss  []    Amplification  []    Other  []      Otoscopy: Right ear: Clear external ear canal and notable landmarks visualized on the tympanic membrane. Left ear:  Clear external ear canal and notable landmarks visualized on the tympanic membrane.  Tympanometry: Right ear: Type A- Normal external ear canal volume with normal middle ear pressure and tympanic membrane compliance. Left ear: Type As- Normal external ear canal volume with normal middle ear pressure and low tympanic membrane compliance.  Pure tone Audiometry: Right ear- Normal to moderate sensorineural hearing loss from 125 Hz - 8000 Hz. Left ear-  Normal to moderate sensorineural hearing loss from 125 Hz - 8000 Hz.  Speech Audiometry: Right ear- Speech Reception Threshold (SRT) was obtained at 25 dBHL. Left ear-Speech Reception Threshold (SRT) was obtained at 25 dBHL.   Word Recognition Score Tested using NU-6 (recorded) Right ear: 96% was obtained at a presentation level of 65 dBHL with contralateral masking which is deemed as  excellent. Left ear: 100% was obtained at a presentation level of  65 dBHL with contralateral masking which is deemed as  excellent.   The hearing test results were completed under headphones and re-checked with inserts and results are deemed to be of good reliability. Test technique:  conventional    Impression: There is not a significant difference in pure-tone thresholds between ears.  There is not a significant difference in the word recognition score in between ears.    Recommendations: Follow up with ENT as scheduled for today. Return for a hearing evaluation if concerns with hearing changes arise or per MD recommendation.   Garon Melander MARIE LEROUX-MARTINEZ, AUD

## 2024-06-26 NOTE — Progress Notes (Signed)
 Dear Dr. Joshua, Here is my assessment for our mutual patient, Kayla Khan. Thank you for allowing me the opportunity to care for your patient. Please do not hesitate to contact me should you have any other questions. Sincerely, Dr. Penne Croak  Otolaryngology Clinic Note Referring provider: Dr. Joshua HPI:   Kayla Khan is a very pleasant 73F here for gradual progressive tinnitus and hearing loss. Symptoms worse in quiet environments. She is concerned if she needs hearing aids. She doesn't want to wait too long, like her grandmother.   Independent Review of Additional Tests or Records:  Reviewed external note from referring PCP, Jones,describing RElevant history incorporated into today's evaluation. I personally reviewed and interpreted audiogram. Normal hearing downsloping to mod SNHL AU. Word descrimination 96% on the right and 100 on the left. Type A and As, right and left respectfully.     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 h/o such - cor CT 03/2020 with minimal CAD, consider noncardiac causes of pain.   Palpitations    Pneumonia    PONV (postoperative nausea and vomiting)    Pre-diabetes    Sleep apnea    Thyroid  nodule    The patient had a thyroid  nodule that was surgically removed in the past.  She has one half of her thyroid  and does not take replacement of Taxol  and he was benign     Past Surgical History:  Procedure Laterality Date   APPENDECTOMY     BREAST BIOPSY     CESAREAN SECTION     x2   FORAMINOTOMY 1 LEVEL N/A 12/07/2023   Procedure: OPEN LUMBAR LAMINECTOMY AND MEDIAL FACETECTOMY LUMBAR FOUR-LUMBAR FIVE;  Surgeon: Carollee Lani BROCKS, DO;  Location: [facility] OR;  Service: Neurosurgery;  Laterality: N/A;   GANGLION CYST EXCISION     THYROIDECTOMY, PARTIAL     left   TUBAL LIGATION     VESICOVAGINAL FISTULA CLOSURE W/ TAH      Family History  Problem Relation Age of Onset   Cancer Mother    Hypertension Father    Aneurysm Father 78       cerebral hemorrhage   Cancer Brother    Stroke Maternal Grandmother    Heart attack Maternal Grandfather    Stroke Paternal Grandfather    Diabetes Other    Heart disease Other    Cancer Other    Arthritis Other    Hypertension Other    Aneurysm Other    Colon cancer Son      Social Connections: Moderately Integrated (03/31/2024)   Social Connection and Isolation Panel    Frequency of Communication with Friends and Family: More than three times a week    Frequency of Social Gatherings with Friends and Family: Three times a week  Attends Religious Services: More than 4 times per year    Active Member of Clubs or Organizations: Yes    Attends Banker Meetings: More than 4 times per year    Marital Status: Divorced      Current Outpatient Medications:    Fluocinolone Acetonide 0.01 % OIL, Place 5 drops in ear(s) 2 (two) times daily for 8 days., Disp: 60 each, Rfl: 0   ALPRAZolam  (XANAX ) 0.25 MG tablet, TAKE 1 TABLET(0.25 MG) BY MOUTH TWICE DAILY AS NEEDED, Disp: 35 tablet, Rfl: 3   aspirin  EC 81 MG tablet, Take 1 tablet (81 mg total) by mouth daily., Disp: , Rfl:    azelastine  (ASTELIN ) 0.1 % nasal spray, Place 1 spray into both nostrils 2 (two) times daily as needed. Use in each nostril as directed, Disp: 30 mL, Rfl: 5   BENFOTIAMINE PO, Take 300 mcg by mouth daily., Disp: , Rfl:     cholecalciferol (VITAMIN D3) 25 MCG (1000 UNIT) tablet, Take 2,000 Units by mouth daily., Disp: , Rfl:    ciclopirox  (LOPROX ) 0.77 % cream, Apply topically 2 (two) times daily., Disp: 90 g, Rfl: 2   CITRULLINE PO, Take 750 mg by mouth 2 (two) times daily., Disp: , Rfl:    clobetasol  ointment (TEMOVATE ) 0.05 %, Apply 1 application topically 2 (two) times daily., Disp: 30 g, Rfl: 2   Coenzyme Q10 (COQ10) 100 MG CAPS, Take 100 mg by mouth daily., Disp: , Rfl:    conjugated estrogens (PREMARIN) vaginal cream, Place 1 Applicatorful vaginally 2 (two) times a week. , Disp: , Rfl:    cyanocobalamin (VITAMIN B12) 1000 MCG tablet, Take 1,000 mcg by mouth daily., Disp: , Rfl:    desonide  (DESOWEN ) 0.05 % cream, Apply topically 2 (two) times daily. (Patient taking differently: Apply 1 Application topically 2 (two) times daily as needed (itching).), Disp: 60 g, Rfl: 2   diltiazem  (CARDIZEM  CD) 180 MG 24 hr capsule, Take 1 capsule (180 mg total) by mouth daily., Disp: 90 capsule, Rfl: 3   empagliflozin  (JARDIANCE ) 10 MG TABS tablet, Take 1 tablet (10 mg total) by mouth daily before breakfast., Disp: 90 tablet, Rfl: 1   famotidine  (PEPCID ) 40 MG tablet, Take 1 tablet (40 mg total) by mouth daily., Disp: 90 tablet, Rfl: 1   fish oil-omega-3 fatty acids 1000 MG capsule, Take 3 g by mouth daily., Disp: , Rfl:    fluticasone  (FLONASE ) 50 MCG/ACT nasal spray, Place 2 sprays into both nostrils daily. (Patient taking differently: Place 2 sprays into both nostrils as needed for allergies.), Disp: 16 g, Rfl: 5   L-Theanine 200 MG CAPS, Take 200 mg by mouth daily., Disp: , Rfl:    meclizine  (ANTIVERT ) 25 MG tablet, Take 25 mg by mouth 3 (three) times daily as needed for dizziness., Disp: , Rfl:    melatonin 5 MG TABS, Take 5 mg by mouth at bedtime., Disp: , Rfl:    methocarbamol  (ROBAXIN ) 500 MG tablet, Take 1 tablet (500 mg total) by mouth every 6 (six) hours as needed for muscle spasms., Disp: 90 tablet, Rfl: 2    Multiple Vitamin (MULTIVITAMIN WITH MINERALS) TABS tablet, Take 1 tablet by mouth daily., Disp: , Rfl:    pravastatin  (PRAVACHOL ) 40 MG tablet, Take 1 tablet (40 mg total) by mouth daily., Disp: 90 tablet, Rfl: 1   solifenacin  (VESICARE ) 5 MG tablet, Take 1 tablet (5 mg total) by mouth daily., Disp: 90 tablet, Rfl: 1   Suvorexant  (BELSOMRA ) 15 MG TABS,  Take 1 tablet (15 mg total) by mouth at bedtime as needed., Disp: 90 tablet, Rfl: 1   telmisartan  (MICARDIS ) 80 MG tablet, Take 1 tablet (80 mg total) by mouth daily., Disp: 90 tablet, Rfl: 1   Physical Exam:   BP 129/71   Pulse 67   Ht 5' 4 (1.626 m)   Wt 180 lb (81.6 kg)   SpO2 95%   BMI 30.90 kg/m   The patient was awake, alert, and appropriate. The external ears were inspected, and otoscopy was performed to evaluate the external auditory canals and tympanic membranes. The nasal cavity and septum were examined for mucosal changes, obstruction, or discharge. The oral cavity and oropharynx were inspected for mucosal lesions, infection, or tonsillar hypertrophy. The neck was palpated for lymphadenopathy, thyroid  abnormalities, or other masses. Cranial nerve function was grossly intact.  Pertinent Findings: Tms with normal landmarks and aerated    Seprately Identifiable Procedures:  I personally ordered, reviewed and interpreted the following with the patient today  Procedure: Bilateral ear microscopy using microscope (CPT 92504) Pre-procedure diagnosis: tinnitus Post-procedure diagnosis: same Indication: see above; given patient's otologic complaints and history, for improved and comprehensive examination of external ear and tympanic membrane, bilateral otologic examination using microscope was performed. Prior to proceeding, verbal consent was obtained after discussion of R/B/A  Procedure: Patient was placed semi-recumbent. Both ear canals were examined using the microscope with findings below. Patient tolerated the procedure  well.  Right ear:  No significant lesions pinna. EAC: no significant lesions. Canal is clear. Eczematoid changes. mild TM: Intact   Left ear:  No significant lesions pinna. EAC: no significant lesions. Canal is clear. Eczematoid changes. mild TM: Intact    Impression & Plans:  Kayla Khan is a 73 y.o. female  1. Sensorineural hearing loss (SNHL) of both ears   2. Tinnitus aurium, bilateral   3. Chronic eczematous otitis externa of both ears    - Findings and diagnoses discussed in detail with the patient. - Risks, benefits, and alternatives were reviewed. Through shared decision making, the patient elects to proceed with below.  - Recommend hearing aid trial  - list provided - Repeat audiogram in one year - Can consider baby oil or olive oil for mild eczema of the ear canal   - continue cream provided by PCP  - added oil formulated treatment - use background noise or white noise for ear ringing.   - Orders placed: No orders of the defined types were placed in this encounter.  - Medications prescribed/continued/adjusted:  Meds ordered this encounter  Medications   Fluocinolone Acetonide 0.01 % OIL    Sig: Place 5 drops in ear(s) 2 (two) times daily for 8 days.    Dispense:  60 each    Refill:  0   - Education materials provided to the patient. - Follow up: one year. Patient instructed to return sooner or go to the ED if new/worsening symptoms develop.   Thank you for allowing me the opportunity to care for your patient. Please do not hesitate to contact me should you have any other questions.  Sincerely, Penne Croak, DO Otolaryngologist (ENT) [facility] Phone: [phone] Fax: [phone]  06/26/2024, 3:02 PM

## 2024-07-03 ENCOUNTER — Other Ambulatory Visit: Payer: Self-pay | Admitting: Internal Medicine

## 2024-07-03 DIAGNOSIS — K2100 Gastro-esophageal reflux disease with esophagitis, without bleeding: Secondary | ICD-10-CM

## 2024-07-03 DIAGNOSIS — I10 Essential (primary) hypertension: Secondary | ICD-10-CM

## 2024-07-03 DIAGNOSIS — E785 Hyperlipidemia, unspecified: Secondary | ICD-10-CM

## 2024-07-05 DIAGNOSIS — M47816 Spondylosis without myelopathy or radiculopathy, lumbar region: Secondary | ICD-10-CM | POA: Diagnosis not present

## 2024-07-27 ENCOUNTER — Encounter: Payer: Self-pay | Admitting: Internal Medicine

## 2024-07-27 NOTE — Progress Notes (Signed)
 Pharmacy Quality Measure Review  This patient is appearing on a report for being at risk of failing the adherence measure for diabetes medications this calendar year.   Medication: Empagliflozin  10mg  Last fill date: 10/29 for 90 day supply  Insurance report was not up to date. No action needed at this time.   Angela Baalmann, PharmD [facility] Pharmacist

## 2024-09-13 ENCOUNTER — Encounter: Payer: Self-pay | Admitting: Internal Medicine

## 2024-09-13 ENCOUNTER — Ambulatory Visit: Admitting: Internal Medicine

## 2024-09-13 ENCOUNTER — Ambulatory Visit: Payer: Self-pay | Admitting: Internal Medicine

## 2024-09-13 VITALS — BP 124/68 | HR 86 | Temp 98.4°F | Resp 16 | Ht 64.0 in | Wt 179.0 lb

## 2024-09-13 DIAGNOSIS — F5104 Psychophysiologic insomnia: Secondary | ICD-10-CM | POA: Diagnosis not present

## 2024-09-13 DIAGNOSIS — R251 Tremor, unspecified: Secondary | ICD-10-CM | POA: Diagnosis not present

## 2024-09-13 DIAGNOSIS — K2100 Gastro-esophageal reflux disease with esophagitis, without bleeding: Secondary | ICD-10-CM

## 2024-09-13 DIAGNOSIS — F411 Generalized anxiety disorder: Secondary | ICD-10-CM

## 2024-09-13 DIAGNOSIS — E785 Hyperlipidemia, unspecified: Secondary | ICD-10-CM | POA: Diagnosis not present

## 2024-09-13 DIAGNOSIS — R0609 Other forms of dyspnea: Secondary | ICD-10-CM | POA: Diagnosis not present

## 2024-09-13 DIAGNOSIS — D508 Other iron deficiency anemias: Secondary | ICD-10-CM | POA: Diagnosis not present

## 2024-09-13 DIAGNOSIS — I10 Essential (primary) hypertension: Secondary | ICD-10-CM | POA: Diagnosis not present

## 2024-09-13 DIAGNOSIS — I2510 Atherosclerotic heart disease of native coronary artery without angina pectoris: Secondary | ICD-10-CM

## 2024-09-13 DIAGNOSIS — R7303 Prediabetes: Secondary | ICD-10-CM

## 2024-09-13 DIAGNOSIS — N1831 Chronic kidney disease, stage 3a: Secondary | ICD-10-CM | POA: Diagnosis not present

## 2024-09-13 DIAGNOSIS — N3281 Overactive bladder: Secondary | ICD-10-CM | POA: Diagnosis not present

## 2024-09-13 LAB — BASIC METABOLIC PANEL WITH GFR
BUN: 41 mg/dL — ABNORMAL HIGH (ref 6–23)
CO2: 26 meq/L (ref 19–32)
Calcium: 9.9 mg/dL (ref 8.4–10.5)
Chloride: 107 meq/L (ref 96–112)
Creatinine, Ser: 1.04 mg/dL (ref 0.40–1.20)
GFR: 52.41 mL/min — ABNORMAL LOW
Glucose, Bld: 101 mg/dL — ABNORMAL HIGH (ref 70–99)
Potassium: 4.5 meq/L (ref 3.5–5.1)
Sodium: 139 meq/L (ref 135–145)

## 2024-09-13 LAB — CBC WITH DIFFERENTIAL/PLATELET
Basophils Absolute: 0.1 K/uL (ref 0.0–0.1)
Basophils Relative: 1 % (ref 0.0–3.0)
Eosinophils Absolute: 0.1 K/uL (ref 0.0–0.7)
Eosinophils Relative: 1.6 % (ref 0.0–5.0)
HCT: 42.1 % (ref 36.0–46.0)
Hemoglobin: 14 g/dL (ref 12.0–15.0)
Lymphocytes Relative: 21.8 % (ref 12.0–46.0)
Lymphs Abs: 1.3 K/uL (ref 0.7–4.0)
MCHC: 33.2 g/dL (ref 30.0–36.0)
MCV: 96.1 fl (ref 78.0–100.0)
Monocytes Absolute: 0.5 K/uL (ref 0.1–1.0)
Monocytes Relative: 8.8 % (ref 3.0–12.0)
Neutro Abs: 3.8 K/uL (ref 1.4–7.7)
Neutrophils Relative %: 66.8 % (ref 43.0–77.0)
Platelets: 260 K/uL (ref 150.0–400.0)
RBC: 4.38 Mil/uL (ref 3.87–5.11)
RDW: 13 % (ref 11.5–15.5)
WBC: 5.7 K/uL (ref 4.0–10.5)

## 2024-09-13 LAB — URINALYSIS, ROUTINE W REFLEX MICROSCOPIC
Bilirubin Urine: NEGATIVE
Hgb urine dipstick: NEGATIVE
Ketones, ur: NEGATIVE
Leukocytes,Ua: NEGATIVE
Nitrite: NEGATIVE
Specific Gravity, Urine: 1.02 (ref 1.000–1.030)
Total Protein, Urine: NEGATIVE
Urine Glucose: >=1000 — AB
Urobilinogen, UA: 0.2 (ref 0.0–1.0)
pH: 5.5 (ref 5.0–8.0)

## 2024-09-13 LAB — IBC + FERRITIN
Ferritin: 169 ng/mL (ref 10.0–291.0)
Iron: 108 ug/dL (ref 42–145)
Saturation Ratios: 33.1 % (ref 20.0–50.0)
TIBC: 326.2 ug/dL (ref 250.0–450.0)
Transferrin: 233 mg/dL (ref 212.0–360.0)

## 2024-09-13 LAB — LIPID PANEL
Cholesterol: 124 mg/dL (ref 28–200)
HDL: 49.2 mg/dL
LDL Cholesterol: 55 mg/dL (ref 10–99)
NonHDL: 74.49
Total CHOL/HDL Ratio: 3
Triglycerides: 96 mg/dL (ref 10.0–149.0)
VLDL: 19.2 mg/dL (ref 0.0–40.0)

## 2024-09-13 LAB — MICROALBUMIN / CREATININE URINE RATIO
Creatinine,U: 78.2 mg/dL
Microalb Creat Ratio: UNDETERMINED mg/g (ref 0.0–30.0)
Microalb, Ur: <0.7 mg/dL

## 2024-09-13 LAB — BRAIN NATRIURETIC PEPTIDE: Pro B Natriuretic peptide (BNP): 17 pg/mL (ref 1.0–100.0)

## 2024-09-13 LAB — TROPONIN I (HIGH SENSITIVITY): High Sens Troponin I: 4 ng/L (ref 2–17)

## 2024-09-13 LAB — HEMOGLOBIN A1C: Hgb A1c MFr Bld: 5.9 % (ref 4.6–6.5)

## 2024-09-13 MED ORDER — EMPAGLIFLOZIN 10 MG PO TABS: 10.0000 mg | ORAL_TABLET | Freq: Every day | ORAL | 1 refills | Status: DC

## 2024-09-13 MED ORDER — BELSOMRA 15 MG PO TABS: 1.0000 | ORAL_TABLET | Freq: Every evening | ORAL | 1 refills | Status: AC | PRN

## 2024-09-13 MED ORDER — FAMOTIDINE 40 MG PO TABS: 40.0000 mg | ORAL_TABLET | Freq: Every day | ORAL | 1 refills | Status: AC

## 2024-09-13 MED ORDER — PRAVASTATIN SODIUM 40 MG PO TABS: 40.0000 mg | ORAL_TABLET | Freq: Every day | ORAL | 1 refills | Status: AC

## 2024-09-13 MED ORDER — ALPRAZOLAM 0.25 MG PO TABS: 0.2500 mg | ORAL_TABLET | Freq: Two times a day (BID) | ORAL | 3 refills | Status: AC | PRN

## 2024-09-13 MED ORDER — SOLIFENACIN SUCCINATE 5 MG PO TABS: 5.0000 mg | ORAL_TABLET | Freq: Every day | ORAL | 1 refills | Status: DC

## 2024-09-13 NOTE — Progress Notes (Signed)
 "  Subjective:  Patient ID: Kayla Khan, female    DOB: July 12, 1951  Age: 74 y.o. MRN: [id number]  CC: Coronary Artery Disease   HPI Kayla Khan presents for f/up  --  Discussed the use of AI scribe software for clinical note transcription with the patient, who gave verbal consent to proceed.  History of Present Illness Kayla Khan is a 74 year old female with chronic back pain and tremors who presents with worsening symptoms.  She underwent a back ablation in September or October, which did not alleviate her pain. Her back pain is worsening and significantly interferes with daily activities such as cooking, doing laundry, and walking. The pain is described as acute at times, feeling like 'somebody stepping on me, pushing down on,' and improves with sitting but worsens with standing or walking for more than five minutes. She experiences shortness of breath when the pain is severe. Tylenol  has little effect, and while ibuprofen is helpful, she limits its use to twice a week due to her blood pressure. She previously completed two and a half months of physical therapy, which was beneficial, and is interested in resuming it.  Her tremors are worsening and have not improved over time. She regrets not pursuing a referral to a neurologist earlier and is now seeking a referral to a specialist in neuromuscular disorders. Her walking has become increasingly stiff, likened to the 'Tin Man.'  She does not feel weak, dizzy, or lightheaded. Her blood pressure is usually around 124/75. She takes solifenacin  every third day to manage bladder control, which causes some dehydration. She has experienced weight loss, attributed to a decreased appetite rather than her medication, Jardiance .  She reports a nasal drip causing a continuous cough and notes some swelling around her ankles. No stomach issues, nausea, vomiting, diarrhea, or feeling dehydrated.     Outpatient Medications Prior to Visit   Medication Sig Dispense Refill   aspirin  EC 81 MG tablet Take 1 tablet (81 mg total) by mouth daily.     azelastine  (ASTELIN ) 0.1 % nasal spray Place 1 spray into both nostrils 2 (two) times daily as needed. Use in each nostril as directed 30 mL 5   BENFOTIAMINE PO Take 300 mcg by mouth daily.     cholecalciferol (VITAMIN D3) 25 MCG (1000 UNIT) tablet Take 2,000 Units by mouth daily.     ciclopirox  (LOPROX ) 0.77 % cream Apply topically 2 (two) times daily. 90 g 2   CITRULLINE PO Take 750 mg by mouth 2 (two) times daily.     clobetasol  ointment (TEMOVATE ) 0.05 % Apply 1 application topically 2 (two) times daily. 30 g 2   Coenzyme Q10 (COQ10) 100 MG CAPS Take 100 mg by mouth daily.     conjugated estrogens (PREMARIN) vaginal cream Place 1 Applicatorful vaginally 2 (two) times a week.      cyanocobalamin (VITAMIN B12) 1000 MCG tablet Take 1,000 mcg by mouth daily.     desonide  (DESOWEN ) 0.05 % cream Apply topically 2 (two) times daily. (Patient taking differently: Apply 1 Application topically 2 (two) times daily as needed (itching).) 60 g 2   diltiazem  (CARDIZEM  CD) 180 MG 24 hr capsule Take 1 capsule (180 mg total) by mouth daily. 90 capsule 3   fish oil-omega-3 fatty acids 1000 MG capsule Take 3 g by mouth daily.     fluticasone  (FLONASE ) 50 MCG/ACT nasal spray Place 2 sprays into both nostrils daily. (Patient taking differently: Place 2 sprays into both  nostrils as needed for allergies.) 16 g 5   L-Theanine 200 MG CAPS Take 200 mg by mouth daily.     meclizine  (ANTIVERT ) 25 MG tablet Take 25 mg by mouth 3 (three) times daily as needed for dizziness.     melatonin 5 MG TABS Take 5 mg by mouth at bedtime.     methocarbamol  (ROBAXIN ) 500 MG tablet Take 1 tablet (500 mg total) by mouth every 6 (six) hours as needed for muscle spasms. 90 tablet 2   Multiple Vitamin (MULTIVITAMIN WITH MINERALS) TABS tablet Take 1 tablet by mouth daily.     ALPRAZolam  (XANAX ) 0.25 MG tablet TAKE 1 TABLET(0.25 MG) BY  MOUTH TWICE DAILY AS NEEDED 35 tablet 3   empagliflozin  (JARDIANCE ) 10 MG TABS tablet Take 1 tablet (10 mg total) by mouth daily before breakfast. 90 tablet 1   famotidine  (PEPCID ) 40 MG tablet TAKE 1 TABLET BY MOUTH DAILY 90 tablet 1   pravastatin  (PRAVACHOL ) 40 MG tablet TAKE 1 TABLET BY MOUTH DAILY 90 tablet 1   solifenacin  (VESICARE ) 5 MG tablet Take 1 tablet (5 mg total) by mouth daily. 90 tablet 1   Suvorexant  (BELSOMRA ) 15 MG TABS Take 1 tablet (15 mg total) by mouth at bedtime as needed. 90 tablet 1   telmisartan  (MICARDIS ) 80 MG tablet TAKE 1 TABLET BY MOUTH DAILY 90 tablet 1   No facility-administered medications prior to visit.    ROS Review of Systems  Constitutional:  Positive for unexpected weight change (wt loss). Negative for appetite change, chills, diaphoresis and fatigue.  HENT:  Positive for postnasal drip. Negative for ear pain, facial swelling, rhinorrhea, sinus pressure, sore throat and tinnitus.   Eyes: Negative.   Respiratory:  Positive for cough and shortness of breath. Negative for chest tightness and wheezing.   Cardiovascular:  Positive for leg swelling. Negative for chest pain and palpitations.  Gastrointestinal: Negative.  Negative for abdominal pain, constipation, diarrhea, nausea and vomiting.  Endocrine: Positive for polyuria. Negative for polydipsia and polyphagia.  Genitourinary:  Positive for frequency. Negative for difficulty urinating, dysuria, flank pain, hematuria and urgency.  Musculoskeletal:  Positive for back pain and gait problem. Negative for myalgias and neck pain.  Skin: Negative.  Negative for color change and pallor.  Neurological:  Positive for tremors. Negative for dizziness, weakness, light-headedness and numbness.  Hematological:  Negative for adenopathy. Does not bruise/bleed easily.  Psychiatric/Behavioral:  Positive for sleep disturbance. Negative for confusion, decreased concentration, dysphoric mood and suicidal ideas. The patient  is nervous/anxious.     Objective:  BP 124/68 (BP Location: Left Arm, Patient Position: Sitting)   Pulse 86   Temp 98.4 F (36.9 C) (Temporal)   Resp 16   Ht 5' 4 (1.626 m)   Wt 179 lb (81.2 kg)   SpO2 96%   BMI 30.73 kg/m   BP Readings from Last 3 Encounters:  09/13/24 124/68  06/26/24 129/71  04/03/24 128/76    Wt Readings from Last 3 Encounters:  09/13/24 179 lb (81.2 kg)  06/26/24 180 lb (81.6 kg)  04/03/24 188 lb (85.3 kg)    Physical Exam Vitals reviewed.  Constitutional:      Appearance: She is not ill-appearing.  HENT:     Nose: Nose normal.     Mouth/Throat:     Mouth: Mucous membranes are moist.  Eyes:     General: No scleral icterus.    Conjunctiva/sclera: Conjunctivae normal.  Cardiovascular:     Rate and Rhythm: Normal rate  and regular rhythm. Occasional Extrasystoles are present.    Heart sounds: Normal heart sounds, S1 normal and S2 normal. No murmur heard.    No friction rub. No gallop.     Comments: EKG--- SR with PAC's, 75 bpm Low voltage  NS ST changes No LVH or Q waves Unchanged  Pulmonary:     Effort: Pulmonary effort is normal.     Breath sounds: No stridor. No wheezing, rhonchi or rales.  Abdominal:     General: Abdomen is flat.     Palpations: There is no mass.     Tenderness: There is no abdominal tenderness. There is no guarding.     Hernia: No hernia is present.  Musculoskeletal:     Cervical back: Neck supple.     Right lower leg: Edema (ankle edema) present.     Left lower leg: Edema (ankle edema) present.  Skin:    General: Skin is warm and dry.  Neurological:     General: No focal deficit present.     Mental Status: She is alert. Mental status is at baseline.  Psychiatric:        Mood and Affect: Mood normal.        Behavior: Behavior normal.     Lab Results  Component Value Date   WBC 5.7 09/13/2024   HGB 14.0 09/13/2024   HCT 42.1 09/13/2024   PLT 260.0 09/13/2024   GLUCOSE 101 (H) 09/13/2024   CHOL 124  09/13/2024   TRIG 96.0 09/13/2024   HDL 49.20 09/13/2024   LDLDIRECT 92.0 12/15/2016   LDLCALC 55 09/13/2024   ALT 18 04/03/2024   AST 16 04/03/2024   NA 139 09/13/2024   K 4.5 09/13/2024   CL 107 09/13/2024   CREATININE 1.04 09/13/2024   BUN 41 (H) 09/13/2024   CO2 26 09/13/2024   TSH 1.36 10/01/2023   HGBA1C 5.9 09/13/2024   MICROALBUR <0.7 09/13/2024    US  Renal Result Date: 04/10/2024 CLINICAL DATA:  Chronic kidney disease EXAM: RENAL / URINARY TRACT ULTRASOUND COMPLETE COMPARISON:  None available FINDINGS: Right Kidney: Renal measurements: 10.4 x 5.2 x 6.0 cm = volume: 168 mL. Mild diffuse thinning of the renal cortex. No hydronephrosis. No mass. Left Kidney: Renal measurements: 10.1 x 5.4 x 5.1 cm = volume: 148 mL. Mild diffuse thinning of the renal cortex. No hydronephrosis. No mass. Bladder: Appears normal for degree of bladder distention. Other: None. IMPRESSION: Mild diffuse thinning of the renal cortices consistent with chronic medical renal disease. Electronically Signed   By: Aliene Lloyd M.D.   On: 04/10/2024 16:48    Estimated Creatinine Clearance: 48.2 mL/min (by C-G formula based on SCr of 1.04 mg/dL).   Assessment & Plan:  Prediabetes -     Basic metabolic panel with GFR; Future -     Hemoglobin A1c; Future  GAD (generalized anxiety disorder) -     ALPRAZolam ; Take 1 tablet (0.25 mg total) by mouth 2 (two) times daily as needed for anxiety.  Dispense: 35 tablet; Refill: 3  Essential hypertension- BP is over-controlled. Will discontinue the ARB. -     Basic metabolic panel with GFR; Future -     Urinalysis, Routine w reflex microscopic; Future -     AMB Referral VBCI Care Management  Hyperlipidemia with target LDL less than 130 -     Lipid panel; Future -     Pravastatin  Sodium; Take 1 tablet (40 mg total) by mouth daily.  Dispense: 90 tablet; Refill: 1  Gastroesophageal reflux disease with esophagitis without hemorrhage -     CBC with Differential/Platelet;  Future -     Famotidine ; Take 1 tablet (40 mg total) by mouth daily.  Dispense: 90 tablet; Refill: 1  Stage 3a chronic kidney disease (HCC)- Will continue the SGLT2-inh. -     Basic metabolic panel with GFR; Future -     Urinalysis, Routine w reflex microscopic; Future -     Microalbumin / creatinine urine ratio; Future -     Empagliflozin ; Take 1 tablet (10 mg total) by mouth daily before breakfast.  Dispense: 90 tablet; Refill: 1  Psychophysiological insomnia -     Belsomra ; Take 1 tablet (15 mg total) by mouth at bedtime as needed.  Dispense: 90 tablet; Refill: 1  Iron deficiency anemia secondary to inadequate dietary iron intake -     CBC with Differential/Platelet; Future -     IBC + Ferritin; Future  Mild CAD -     EKG 12-Lead -     Brain natriuretic peptide; Future -     Troponin I (High Sensitivity); Future -     Pravastatin  Sodium; Take 1 tablet (40 mg total) by mouth daily.  Dispense: 90 tablet; Refill: 1  DOE (dyspnea on exertion) -     Brain natriuretic peptide; Future -     Troponin I (High Sensitivity); Future  Tremor -     Ambulatory referral to Neurology  OAB (overactive bladder) -     Solifenacin  Succinate; Take 1 tablet (5 mg total) by mouth daily.  Dispense: 90 tablet; Refill: 1     Follow-up: Return in about 6 months (around 03/13/2025).  Debby Molt, MD "

## 2024-09-13 NOTE — Patient Instructions (Signed)

## 2024-09-14 ENCOUNTER — Telehealth: Payer: Self-pay | Admitting: Internal Medicine

## 2024-09-14 NOTE — Progress Notes (Signed)
 Care Guide Pharmacy Note  09/14/2024 Name: Kayla Khan MRN: [id number] DOB: 1951/08/24  Referred By: Joshua Debby LITTIE, MD Reason for referral: Complex Care Management and Call Attempt #1 (Outreach to sch ref w/ pharm)   Kayla Khan is a 74 y.o. year old female who is a primary care patient of Joshua Debby LITTIE, MD.  Kayla Khan was referred to the pharmacist for assistance related to: HTN  Successful contact was made with the patient to discuss pharmacy services including being ready for the pharmacist to call at least 5 minutes before the scheduled appointment time and to have medication bottles and any blood pressure readings ready for review. The patient agreed to meet with the pharmacist via telephone visit on 09/26/2024  Thedford Franks, CMA, AAMA [place]  [facility], [facility] Guide, [place] Direct Dial: [phone]  Fax: [phone]

## 2024-09-14 NOTE — Progress Notes (Signed)
 Care Guide Pharmacy Note  09/14/2024 Name: Kayla Khan MRN: [id number] DOB: 02-16-1951  Referred By: Joshua Debby LITTIE, MD Reason for referral: Complex Care Management and Call Attempt #1 (Outreach to sch ref w/ pharm)   Kayla Khan is a 74 y.o. year old female who is a primary care patient of Joshua Debby LITTIE, MD.  Kayla Khan was referred to the pharmacist for assistance related to: HTN  An unsuccessful telephone outreach was attempted today to contact the patient who was referred to the pharmacy team for assistance with medication management. Additional attempts will be made to contact the patient.  Doyce Razor [facility], [facility] Guide Direct Dial:   Fax: [phone]

## 2024-09-25 ENCOUNTER — Encounter: Payer: Self-pay | Admitting: Internal Medicine

## 2024-09-26 ENCOUNTER — Encounter: Payer: Self-pay | Admitting: Internal Medicine

## 2024-09-26 ENCOUNTER — Other Ambulatory Visit: Admitting: Pharmacist

## 2024-09-26 DIAGNOSIS — I10 Essential (primary) hypertension: Secondary | ICD-10-CM

## 2024-09-26 NOTE — Patient Instructions (Addendum)
 It was a pleasure speaking with you today!  We discussed stopping solifenacin  and diltiazem  to identify if these medications could possibly be contributing to your symptoms. We will follow up with you in 2 weeks.   Feel free to call with any questions or concerns!  Dionicia Canavan, PharmD, RPh PGY1 Acute Care Pharmacy Resident [facility]   Darrelyn Drum, PharmD, BCPS, CPP Clinical Pharmacist Practitioner [place] [facility] [phone]

## 2024-09-26 NOTE — Progress Notes (Signed)
 "  09/26/2024 Name: ZALEY TALLEY MRN: [id number] DOB: [birth year]  Chief Complaint  Patient presents with   Medication Management   Hypertension    Sravya L Pies is a 74 y.o. year old female who presented for a telephone visit.   They were referred to the pharmacist by their PCP for assistance in managing hypertension and complex medication management.    Subjective:  Care Team: Primary Care Provider: Joshua Debby LITTIE, MD ; Next Scheduled Visit: 03/13/25 Neurologist: Modena Callander; Next Scheduled Visit: 5/26  Medication Access/Adherence  Current Pharmacy:  [facility] - [place], [place] - [address] [place] [address] Phone: [phone] Fax: [phone]   Patient reports affordability concerns with their medications: No  Patient reports access/transportation concerns to their pharmacy: No  Patient reports adherence concerns with their medications:  Yes  -Patient reports feeling overall worse with symptoms of shortness of breath, tachycardia, dehydration. These symptoms were present at her recent PCP visit. She attributes these symptoms to a medication reaction. Reports most recent changes include discontinuing telmisartan . She stopped taking Jardiance  for a week, but symptoms did not resolve. She plans to start taking jardiance  again. Patient would like to stop solifenacin , believes it is contributing to feeling dehydrated, and also stop diltiazem . Suspects that it could be related to onset of Parkinsons. Has an appointment with Neurology in May.   Hypertension:  Current medications: diltiazem  180mg  by mouth daily Medications previously tried: amlodipine , chlorthalidone , valsartan , hydrochlorothiazide , losartan , irbesartan , telmisartan   Patient has a validated, automated, upper arm home BP cuff Current blood pressure readings readings: 124/79   Objective:  Lab Results  Component Value Date   HGBA1C 5.9 09/13/2024    Lab Results   Component Value Date   CREATININE 1.04 09/13/2024   BUN 41 (H) 09/13/2024   NA 139 09/13/2024   K 4.5 09/13/2024   CL 107 09/13/2024   CO2 26 09/13/2024    Lab Results  Component Value Date   CHOL 124 09/13/2024   HDL 49.20 09/13/2024   LDLCALC 55 09/13/2024   LDLDIRECT 92.0 12/15/2016   TRIG 96.0 09/13/2024   CHOLHDL 3 09/13/2024    Medications Reviewed Today     Reviewed by Maybelle Dionicia LITTIE, RPH (Pharmacist) on 09/26/24 at 1133  Med List Status: <None>   Medication Order Taking? Sig Documenting Provider Last Dose Status Informant  ALPRAZolam  (XANAX ) 0.25 MG tablet [id number] Yes Take 1 tablet (0.25 mg total) by mouth 2 (two) times daily as needed for anxiety. Joshua Debby LITTIE, MD  Active   aspirin  EC 81 MG tablet [id number] Yes Take 1 tablet (81 mg total) by mouth daily. Dawley, Troy C, DO  Active   azelastine  (ASTELIN ) 0.1 % nasal spray [id number]  Place 1 spray into both nostrils 2 (two) times daily as needed. Use in each nostril as directed Tobie Arleta SQUIBB, MD  Active Self, Pharmacy Records  BENFOTIAMINE PO [id number]  Take 300 mcg by mouth daily.  Patient not taking: Reported on 09/26/2024   [provider]  Active Self, Pharmacy Records  cholecalciferol (VITAMIN D3) 25 MCG (1000 UNIT) tablet [id number] Yes Take 2,000 Units by mouth daily. [provider]  Active Self, Pharmacy Records  ciclopirox  (LOPROX ) 0.77 % cream [id number] Yes Apply topically 2 (two) times daily. Joshua Debby LITTIE, MD  Active Self, Pharmacy Records  CITRULLINE PO [id number]  Take 750 mg by mouth 2 (two) times daily. [provider]  Active Self, Pharmacy Records  clobetasol   ointment (TEMOVATE ) 0.05 % [id number] Yes Apply 1 application topically 2 (two) times daily. Joshua Debby CROME, MD  Active Self, Pharmacy Records           Med Note ELSWORTH, JACQUELINE L   Mon Nov 22, 2023  8:58 AM)    Coenzyme Q10 (COQ10) 100 MG CAPS [id number] Yes Take 100 mg by mouth daily. [provider]   Active Self, Pharmacy Records  conjugated estrogens (PREMARIN) vaginal cream [id number] Yes Place 1 Applicatorful vaginally 2 (two) times a week.  [provider]  Active Self, Pharmacy Records           Med Note ELSWORTH, JACQUELINE L   Mon Nov 22, 2023  8:59 AM)    cyanocobalamin (VITAMIN B12) 1000 MCG tablet [id number] Yes Take 1,000 mcg by mouth daily. [provider]  Active Self, Pharmacy Records  desonide  (DESOWEN ) 0.05 % cream [id number] Yes Apply topically 2 (two) times daily.  Patient taking differently: Apply 1 Application topically 2 (two) times daily as needed (itching).   Joshua Debby CROME, MD  Active Self, Pharmacy Records  diltiazem  (CARDIZEM  CD) 180 MG 24 hr capsule [id number] Yes Take 1 capsule (180 mg total) by mouth daily. Dunn, Dayna N, PA-C  Active   empagliflozin  (JARDIANCE ) 10 MG TABS tablet [id number] Yes Take 1 tablet (10 mg total) by mouth daily before breakfast. Joshua Debby CROME, MD  Active   famotidine  (PEPCID ) 40 MG tablet [id number] Yes Take 1 tablet (40 mg total) by mouth daily. Joshua Debby CROME, MD  Active   fish oil-omega-3 fatty acids 1000 MG capsule [id number] Yes Take 3 g by mouth daily. [provider]  Active Self, Pharmacy Records  fluticasone  (FLONASE ) 50 MCG/ACT nasal spray [id number]  Place 2 sprays into both nostrils daily.  Patient not taking: Reported on 09/26/2024   Tobie Arleta SQUIBB, MD  Active Self, Pharmacy Records  L-Theanine 200 MG CAPS [id number] Yes Take 200 mg by mouth daily. [provider]  Active Self, Pharmacy Records  meclizine  (ANTIVERT ) 25 MG tablet [id number] Yes Take 25 mg by mouth 3 (three) times daily as needed for dizziness. [provider]  Active Self, Pharmacy Records  melatonin 5 MG TABS [id number] Yes Take 5 mg by mouth at bedtime. [provider]  Active Self, Pharmacy Records  methocarbamol  (ROBAXIN ) 500 MG tablet [id number] Yes Take 1 tablet (500 mg total) by mouth every 6 (six) hours as  needed for muscle spasms. Dawley, Lani BROCKS, DO  Active   Multiple Vitamin (MULTIVITAMIN WITH MINERALS) TABS tablet [id number] Yes Take 1 tablet by mouth daily. [provider]  Active Self, Pharmacy Records  pravastatin  (PRAVACHOL ) 40 MG tablet [id number] Yes Take 1 tablet (40 mg total) by mouth daily. Joshua Debby CROME, MD  Active   solifenacin  (VESICARE ) 5 MG tablet [id number] Yes Take 1 tablet (5 mg total) by mouth daily. Joshua Debby CROME, MD  Active   Suvorexant  (BELSOMRA ) 15 MG TABS [id number] Yes Take 1 tablet (15 mg total) by mouth at bedtime as needed. Joshua Debby CROME, MD  Active             Assessment/Plan:   Hypertension: - Currently controlled, BP goal <130/80 - Patient would like to trial off Diltiazem  to identify if this medication is contributing to her symptoms. Blood pressure currently well controlled. Discussed close monitoring of blood pressure while trialing off of diltiazem . If her symptoms are improved but blood pressure becomes elevated, can consider restarting telmisartan . -Recommended to monitor  blood pressure closely and maintain to log to discuss at next visit.   Medication Management: - Currently strategy sufficient given current symptoms to maintain appropriate adherence to prescribed medication regimen - Patent would like to trial off solifenacin  to identify if this medication is contributing to her symptoms.  - Pt can stop both diltiazem  and solifenacin  and monitor her symptoms. Restart 1 at a time, starting with diltiazem  first.   Follow Up Plan: 2 weeks  Dionicia Canavan, PharmD, RPh PGY1 Acute Care Pharmacy Resident [facility]  Darrelyn Drum, PharmD, BCPS, CPP Clinical Pharmacist Practitioner [place] [facility] at [facility] [phone]    "

## 2024-09-27 NOTE — Telephone Encounter (Signed)
Yes,ov

## 2024-09-27 NOTE — Telephone Encounter (Signed)
 yes

## 2024-09-28 ENCOUNTER — Other Ambulatory Visit: Payer: Self-pay

## 2024-09-29 ENCOUNTER — Other Ambulatory Visit: Payer: Self-pay

## 2024-09-29 DIAGNOSIS — R251 Tremor, unspecified: Secondary | ICD-10-CM

## 2024-10-02 ENCOUNTER — Telehealth: Payer: Self-pay | Admitting: Pharmacist

## 2024-10-02 ENCOUNTER — Other Ambulatory Visit

## 2024-10-02 NOTE — Telephone Encounter (Signed)
 Pt sent a MyChart message to me with her BP readings since stopping diltiazem , jardiance , and solifenacin . She also restarted telmisartan  80 mg daily.   A few readings: 1/23 135/76 8:00 am, 117/81 3:00pm, 1/24 128/79 8:00 am, 135/80 12:30 pm, 1/25 118/95 1:00 pm, 121/86 11:00pm 1/26 135/96 3:20 pm.   Pt reports feeling weakness, some shakiness, dehydrated, overall not feeling well for at least the last 2+ weeks.   BP looks fairly well controlled on average with diastolic running high at times. Does not seem her symptoms are related to medication side effects.  Scheduled her to see Dr. Joshua tomorrow, 1/27 at 2 PM for further evaluation.  Kayla Khan, PharmD, BCPS, CPP Clinical Pharmacist Practitioner [place] [facility] at [facility] [phone]

## 2024-10-03 ENCOUNTER — Ambulatory Visit: Admitting: Internal Medicine

## 2024-10-03 ENCOUNTER — Ambulatory Visit

## 2024-10-03 ENCOUNTER — Encounter: Payer: Self-pay | Admitting: Internal Medicine

## 2024-10-03 ENCOUNTER — Ambulatory Visit: Payer: Self-pay | Admitting: Internal Medicine

## 2024-10-03 ENCOUNTER — Other Ambulatory Visit: Payer: Self-pay

## 2024-10-03 VITALS — BP 144/72 | HR 102 | Temp 97.7°F | Resp 16 | Ht 64.0 in | Wt 175.4 lb

## 2024-10-03 DIAGNOSIS — R531 Weakness: Secondary | ICD-10-CM

## 2024-10-03 DIAGNOSIS — N3281 Overactive bladder: Secondary | ICD-10-CM

## 2024-10-03 DIAGNOSIS — I2510 Atherosclerotic heart disease of native coronary artery without angina pectoris: Secondary | ICD-10-CM

## 2024-10-03 DIAGNOSIS — R0789 Other chest pain: Secondary | ICD-10-CM

## 2024-10-03 DIAGNOSIS — I10 Essential (primary) hypertension: Secondary | ICD-10-CM

## 2024-10-03 DIAGNOSIS — I491 Atrial premature depolarization: Secondary | ICD-10-CM

## 2024-10-03 LAB — HEPATIC FUNCTION PANEL
ALT: 16 U/L (ref 3–35)
AST: 17 U/L (ref 5–37)
Albumin: 4.5 g/dL (ref 3.5–5.2)
Alkaline Phosphatase: 87 U/L (ref 39–117)
Bilirubin, Direct: 0.1 mg/dL (ref 0.1–0.3)
Total Bilirubin: 0.4 mg/dL (ref 0.2–1.2)
Total Protein: 7.3 g/dL (ref 6.0–8.3)

## 2024-10-03 LAB — TROPONIN I (HIGH SENSITIVITY): High Sens Troponin I: 6 ng/L (ref 2–17)

## 2024-10-03 LAB — BASIC METABOLIC PANEL WITH GFR
BUN: 19 mg/dL (ref 6–23)
CO2: 26 meq/L (ref 19–32)
Calcium: 10 mg/dL (ref 8.4–10.5)
Chloride: 107 meq/L (ref 96–112)
Creatinine, Ser: 0.87 mg/dL (ref 0.40–1.20)
GFR: 64.91 mL/min
Glucose, Bld: 102 mg/dL — ABNORMAL HIGH (ref 70–99)
Potassium: 3.9 meq/L (ref 3.5–5.1)
Sodium: 141 meq/L (ref 135–145)

## 2024-10-03 LAB — BRAIN NATRIURETIC PEPTIDE: Pro B Natriuretic peptide (BNP): 57 pg/mL (ref 1.0–100.0)

## 2024-10-03 LAB — TSH: TSH: 1 u[IU]/mL (ref 0.35–5.50)

## 2024-10-03 MED ORDER — MIRABEGRON ER 50 MG PO TB24: 50.0000 mg | ORAL_TABLET | Freq: Every day | ORAL | 0 refills | Status: AC

## 2024-10-03 MED ORDER — TELMISARTAN 80 MG PO TABS: 80.0000 mg | ORAL_TABLET | Freq: Every day | ORAL | 0 refills | Status: DC

## 2024-10-03 MED ORDER — MIRABEGRON ER 50 MG PO TB24: 50.0000 mg | ORAL_TABLET | Freq: Every day | ORAL | 0 refills | Status: DC

## 2024-10-03 MED ORDER — TELMISARTAN 80 MG PO TABS: 80.0000 mg | ORAL_TABLET | Freq: Every day | ORAL | 0 refills | Status: AC

## 2024-10-03 NOTE — Progress Notes (Signed)
 "       Subjective:  Patient ID: Kayla Khan, female    DOB: [birth year]  Age: 74 y.o. MRN: [id number]  CC: Fatigue (X 2 weeks. Patient states that she started having a tremor about a year ago and its getting worst. SOB did a EKG last time but everything was okay. No appetite. Fall about a week ago. She bent over and fell over. ) and Hypertension   HPI Kayla Khan presents for f/up ---  Discussed the use of AI scribe software for clinical note transcription with the patient, who gave verbal consent to proceed.  History of Present Illness Kayla Khan is a 74 year old female with dysautonomia who presents with worsening weakness and dizziness. She is accompanied by her daughter.  She has been experiencing progressively worsening weakness and dizziness. Her heart feels like it's racing, and her blood pressure has been fluctuating significantly, with readings as high as 117/97 and as low as 140/69. She has shortness of breath and has been increasing her fluid intake, including a bottle of electrolytes, to address hydration issues.  She has a history of benign paroxysmal positional vertigo (BPPV) and has performed the Epley maneuver several times without resolution of symptoms. She continues to experience dizziness and vertigo, describing it as 'the world swirls around.' She also reports impaired walking and had a fall approximately a week and a half ago, resulting in a bruise on her leg and back, though these do not cause her pain.  She experiences numbness and tingling in her feet and hands during sleep, which she attributes to her back. These symptoms improve with movement or adjusting her sleeping position. No new speech difficulties or slurred speech.  She feels faint and dizzy, particularly when standing, and her symptoms are worse in the mornings.  Her D has a history of dysautonomia and her daughter suggests a 'poor man's POTS test' due to worsening symptoms upon standing. No  chest pain. Her heart rate has been irregular, with a history of extra heartbeats, described as PVCs.      Outpatient Medications Prior to Visit  Medication Sig Dispense Refill   ALPRAZolam  (XANAX ) 0.25 MG tablet Take 1 tablet (0.25 mg total) by mouth 2 (two) times daily as needed for anxiety. 35 tablet 3   aspirin  EC 81 MG tablet Take 1 tablet (81 mg total) by mouth daily.     cholecalciferol (VITAMIN D3) 25 MCG (1000 UNIT) tablet Take 2,000 Units by mouth daily.     ciclopirox  (LOPROX ) 0.77 % cream Apply topically 2 (two) times daily. 90 g 2   CITRULLINE PO Take 750 mg by mouth 2 (two) times daily.     clobetasol  ointment (TEMOVATE ) 0.05 % Apply 1 application topically 2 (two) times daily. 30 g 2   Coenzyme Q10 (COQ10) 100 MG CAPS Take 100 mg by mouth daily.     conjugated estrogens (PREMARIN) vaginal cream Place 1 Applicatorful vaginally 2 (two) times a week.      cyanocobalamin (VITAMIN B12) 1000 MCG tablet Take 1,000 mcg by mouth daily.     desonide  (DESOWEN ) 0.05 % cream Apply topically 2 (two) times daily. (Patient taking differently: Apply 1 Application topically 2 (two) times daily as needed (itching).) 60 g 2   famotidine  (PEPCID ) 40 MG tablet Take 1 tablet (40 mg total) by mouth daily. 90 tablet 1   fish oil-omega-3 fatty acids 1000 MG capsule Take 3 g by mouth daily.     L-Theanine  200 MG CAPS Take 200 mg by mouth daily.     meclizine  (ANTIVERT ) 25 MG tablet Take 25 mg by mouth 3 (three) times daily as needed for dizziness.     melatonin 5 MG TABS Take 5 mg by mouth at bedtime.     methocarbamol  (ROBAXIN ) 500 MG tablet Take 1 tablet (500 mg total) by mouth every 6 (six) hours as needed for muscle spasms. 90 tablet 2   Multiple Vitamin (MULTIVITAMIN WITH MINERALS) TABS tablet Take 1 tablet by mouth daily.     pravastatin  (PRAVACHOL ) 40 MG tablet Take 1 tablet (40 mg total) by mouth daily. 90 tablet 1   Suvorexant  (BELSOMRA ) 15 MG TABS Take 1 tablet (15 mg total) by mouth at  bedtime as needed. 90 tablet 1   telmisartan  (MICARDIS ) 80 MG tablet Take 80 mg by mouth daily.     diltiazem  (CARDIZEM  CD) 180 MG 24 hr capsule Take 1 capsule (180 mg total) by mouth daily. (Patient not taking: Reported on 10/02/2024) 90 capsule 3   empagliflozin  (JARDIANCE ) 10 MG TABS tablet Take 1 tablet (10 mg total) by mouth daily before breakfast. (Patient not taking: Reported on 10/03/2024) 90 tablet 1   solifenacin  (VESICARE ) 5 MG tablet Take 1 tablet (5 mg total) by mouth daily. (Patient not taking: Reported on 10/02/2024) 90 tablet 1   No facility-administered medications prior to visit.    ROS Review of Systems  Constitutional:  Negative for appetite change, chills, diaphoresis, fatigue and fever.  HENT: Negative.  Negative for sore throat and trouble swallowing.   Eyes: Negative.  Negative for visual disturbance.  Respiratory: Negative.  Negative for cough, chest tightness, shortness of breath and wheezing.   Cardiovascular:  Negative for chest pain, palpitations and leg swelling.  Gastrointestinal:  Negative for abdominal pain, constipation, diarrhea, nausea and vomiting.  Endocrine: Positive for polyuria. Negative for polydipsia and polyphagia.  Genitourinary:  Positive for frequency. Negative for decreased urine volume, difficulty urinating, dysuria, flank pain, hematuria, pelvic pain and urgency.  Musculoskeletal:  Positive for arthralgias. Negative for joint swelling and myalgias.  Skin: Negative.   Neurological:  Positive for dizziness, tremors, syncope, weakness, light-headedness and numbness. Negative for speech difficulty and headaches.  Hematological:  Negative for adenopathy. Does not bruise/bleed easily.  Psychiatric/Behavioral:  Positive for confusion, dysphoric mood and sleep disturbance. Negative for behavioral problems, decreased concentration, self-injury and suicidal ideas. The patient is nervous/anxious.     Objective:  BP (!) 144/72 (BP Location: Left Arm,  Patient Position: Sitting, Cuff Size: Normal)   Pulse (!) 102   Temp 97.7 F (36.5 C) (Oral)   Resp 16   Ht 5' 4 (1.626 m)   Wt 175 lb 6.4 oz (79.6 kg)   SpO2 94%   BMI 30.11 kg/m   BP Readings from Last 3 Encounters:  10/03/24 (!) 144/72  09/13/24 124/68  06/26/24 129/71    Wt Readings from Last 3 Encounters:  10/03/24 175 lb 6.4 oz (79.6 kg)  09/13/24 179 lb (81.2 kg)  06/26/24 180 lb (81.6 kg)    Physical Exam Vitals reviewed.  Constitutional:      General: She is not in acute distress.    Appearance: She is ill-appearing. She is not toxic-appearing or diaphoretic.  HENT:     Nose: Nose normal.     Mouth/Throat:     Mouth: Mucous membranes are moist.  Eyes:     General: No scleral icterus.    Conjunctiva/sclera: Conjunctivae normal.  Cardiovascular:  Rate and Rhythm: Normal rate and regular rhythm. Frequent Extrasystoles are present.    Heart sounds: No murmur heard.    No friction rub. No gallop.     Comments: EKG- SR with PSVC's (new), 89 bpm Anterior/inferior infarct patterns are not new No LVH Pulmonary:     Effort: Pulmonary effort is normal.     Breath sounds: No stridor. No wheezing, rhonchi or rales.  Abdominal:     General: Abdomen is flat.     Palpations: There is no mass.     Tenderness: There is no abdominal tenderness. There is no guarding.     Hernia: No hernia is present.  Musculoskeletal:     Cervical back: Neck supple.     Right lower leg: No edema.     Left lower leg: No edema.  Lymphadenopathy:     Cervical: No cervical adenopathy.  Skin:    General: Skin is warm and dry.     Findings: Bruising present.  Neurological:     Mental Status: She is alert. Mental status is at baseline.     Motor: Weakness, tremor and abnormal muscle tone present.     Deep Tendon Reflexes: Reflexes normal.  Psychiatric:        Attention and Perception: Perception normal. She is inattentive.        Mood and Affect: Mood is anxious. Mood is not  depressed.        Speech: Speech is delayed.        Behavior: Behavior normal.        Thought Content: Thought content normal.        Cognition and Memory: Cognition is impaired. Memory is impaired.     Lab Results  Component Value Date   WBC 5.7 09/13/2024   HGB 14.0 09/13/2024   HCT 42.1 09/13/2024   PLT 260.0 09/13/2024   GLUCOSE 102 (H) 10/03/2024   CHOL 124 09/13/2024   TRIG 96.0 09/13/2024   HDL 49.20 09/13/2024   LDLDIRECT 92.0 12/15/2016   LDLCALC 55 09/13/2024   ALT 16 10/03/2024   AST 17 10/03/2024   NA 141 10/03/2024   K 3.9 10/03/2024   CL 107 10/03/2024   CREATININE 0.87 10/03/2024   BUN 19 10/03/2024   CO2 26 10/03/2024   TSH 1.00 10/03/2024   HGBA1C 5.9 09/13/2024   MICROALBUR <0.7 09/13/2024    US  Renal Result Date: 04/10/2024 CLINICAL DATA:  Chronic kidney disease EXAM: RENAL / URINARY TRACT ULTRASOUND COMPLETE COMPARISON:  None available FINDINGS: Right Kidney: Renal measurements: 10.4 x 5.2 x 6.0 cm = volume: 168 mL. Mild diffuse thinning of the renal cortex. No hydronephrosis. No mass. Left Kidney: Renal measurements: 10.1 x 5.4 x 5.1 cm = volume: 148 mL. Mild diffuse thinning of the renal cortex. No hydronephrosis. No mass. Bladder: Appears normal for degree of bladder distention. Other: None. IMPRESSION: Mild diffuse thinning of the renal cortices consistent with chronic medical renal disease. Electronically Signed   By: Aliene Lloyd M.D.   On: 04/10/2024 16:48   DG Chest 2 View Result Date: 10/03/2024 CLINICAL DATA:  pain and bruising after a fall EXAM: CHEST - 2 VIEW COMPARISON:  Chest XR, 03/02/2005.  CT chest, 03/25/2020. FINDINGS: Aortic arch atherosclerosis. Cardiomediastinal silhouette is within normal limits. Lungs are well inflated. No focal consolidation or mass. No pleural effusion or pneumothorax. Surgical clips projecting over the LEFT. No acute displaced fracture. IMPRESSION: No acute cardiopulmonary process. Electronically Signed   By: Thom Hall  M.D.   On: 10/03/2024 15:40     Assessment & Plan:  Coronary artery disease, unspecified vessel or lesion type, unspecified whether angina present, unspecified whether native or transplanted heart -     EKG 12-Lead -     Hepatic function panel; Future  Acute chest wall pain -     DG Chest 2 View; Future  Premature supraventricular beat- She is symptomatic. -     Brain natriuretic peptide; Future -     Troponin I (High Sensitivity); Future -     D-dimer, quantitative; Future -     TSH; Future -     Ambulatory referral to Cardiology  Weakness -     Acetylcholine receptor, binding; Future -     Striated muscle antibody; Future -     Hepatic function panel; Future  Essential hypertension- BP is normal. -     Basic metabolic panel with GFR; Future -     Hepatic function panel; Future  OAB (overactive bladder)     Follow-up: Return in about 3 months (around 01/01/2025).  Debby Molt, MD "

## 2024-10-03 NOTE — Patient Instructions (Signed)
 Premature Ventricular Contraction  A premature ventricular contraction (PVC) is a type of irregular heartbeat (arrhythmia). The heart has four chambers, including the upper chambers (atria) and lower chambers (ventricles). Normally, an electrical signal starts in a group of cells called the sinoatrial node (SA node) and travels through the atria, causing them to pump blood into the ventricles. During a PVC, the heartbeat starts in one of the lower ventricles. This may cause the heartbeat to be shorter and less effective. In most cases, PVCs come and go and do not require treatment. What are the causes? Common causes of the condition include: Heart attack or coronary artery disease (CAD). Heart valve problems. Heart surgery. Infection of the heart (myocarditis). Inflammation of the heart. In many cases, the cause of this condition is not known. What increases the risk? The following factors may make you more likely to develop this condition: Age, especially being over age 19. Being female. An imbalance of salts and minerals in the body (electrolytes). Low blood oxygen levels or high carbon dioxide levels. Certain medicines, including over-the-counter and prescribed medicines. High blood pressure. Obesity. Episodes may be triggered by: Vigorous exercise. Tobacco, alcohol, or caffeine use. Illegal drug use. Emotional stress. Poor or irregular sleep. What are the signs or symptoms? The main symptoms of this condition are fast or irregular heartbeats (palpitations) or the feeling of a pause in the heartbeat. Other symptoms include: Shortness of breath. Difficulty exercising. Chest pain. Feeling tired. Dizziness. In some cases, there are no symptoms. How is this diagnosed? This condition may be diagnosed based on: Your medical history or symptoms. A physical exam. Your health care provider may listen to your heart. Tests, such as: Blood tests. An ECG (electrocardiogram) to monitor the  electrical activity of your heart. An ambulatory cardiac monitor that records your heartbeats for 24 hours or more. Stress tests to see how exercise affects your heart rhythm and blood supply. An echocardiogram, which creates an image of your heart. An electrophysiology study (EPS) to check for electrical problems in your heart. How is this treated? Treatment for this condition depends on any underlying conditions, the type of PVC, how many PVCs you have had, and if the symptoms are affecting your daily life. Possible treatments include: Avoiding things that cause PVCs (triggers). These include caffeine, tobacco, and alcohol. Taking medicines if symptoms are severe or if the arrhythmias happen a lot. Getting treatment for underlying conditions that cause PVCs. Having an implantable cardioverter defibrillator (ICD) placed in the chest to monitor the heartbeat. The monitor sends a shock to the heart if it senses an arrhythmia and brings the heartbeat back to normal. Having a catheter ablation procedure to destroy the part of the heart tissue that sends abnormal signals. In many cases, no treatment is required. Follow these instructions at home: Lifestyle  Do not use any products that contain nicotine or tobacco. These products include cigarettes, chewing tobacco, and vaping devices, such as e-cigarettes. If you need help quitting, ask your health care provider. Do not use illegal drugs. Exercise regularly. Ask your health care provider what type of exercise is safe for you. Try to get at least 7-9 hours of sleep each night. Find healthy ways to manage stress. Avoid stressful situations when possible. Alcohol use Do not drink alcohol if: Your health care provider tells you not to drink. You are pregnant, may be pregnant, or are planning to become pregnant. Alcohol triggers your episodes. If you drink alcohol: Limit how much you have to: 0-1  drink a day for women. 0-2 drinks a day for  men. Know how much alcohol is in your drink. In the [place] one drink equals one 12 oz bottle of beer (355 mL), one 5 oz glass of wine (148 mL), or one 1 oz glass of hard liquor (44 mL). General instructions Take over-the-counter and prescription medicines only as told by your health care provider. If caffeine triggers episodes of PVC, do not eat, drink, or use anything with caffeine in it. Contact a health care provider if: You feel palpitations often. You have nausea and vomiting. Get help right away if: You have chest pain. You have trouble breathing. You start sweating for no reason. You become light-headed or you faint. These symptoms may be an emergency. Get help right away. Call 911. Do not wait to see if the symptoms will go away. Do not drive yourself to the hospital. This information is not intended to replace advice given to you by your health care provider. Make sure you discuss any questions you have with your health care provider. Document Revised: 01/23/2022 Document Reviewed: 01/23/2022 Elsevier Patient Education  2024 [employer].

## 2024-10-04 ENCOUNTER — Ambulatory Visit: Admitting: Internal Medicine

## 2024-10-07 LAB — STRIATED MUSCLE ANTIBODY: STRIATED MUSCLE AB SCREEN: NEGATIVE

## 2024-10-07 LAB — ACETYLCHOLINE RECEPTOR, BINDING: A CHR BINDING ABS: <0.3 nmol/L

## 2024-10-07 LAB — D-DIMER, QUANTITATIVE: D-Dimer, Quant: 1.29 ug{FEU}/mL — ABNORMAL HIGH

## 2024-10-09 ENCOUNTER — Other Ambulatory Visit

## 2024-10-09 DIAGNOSIS — I10 Essential (primary) hypertension: Secondary | ICD-10-CM

## 2024-11-02 ENCOUNTER — Ambulatory Visit: Admitting: Internal Medicine

## 2025-01-01 ENCOUNTER — Ambulatory Visit: Admitting: Internal Medicine

## 2025-01-30 ENCOUNTER — Ambulatory Visit: Admitting: Neurology

## 2025-03-13 ENCOUNTER — Ambulatory Visit: Admitting: Internal Medicine

## 2025-04-03 ENCOUNTER — Ambulatory Visit
